# Patient Record
Sex: Male | Born: 1950 | Race: White | Hispanic: No | Marital: Married | Smoking: Former smoker
Health system: Southern US, Community
[De-identification: ages and names within clinical notes are randomized; demographics above are authoritative.]

## PROBLEM LIST (undated history)

## (undated) DIAGNOSIS — C3490 Malignant neoplasm of unspecified part of unspecified bronchus or lung: Secondary | ICD-10-CM

## (undated) DIAGNOSIS — I10 Essential (primary) hypertension: Secondary | ICD-10-CM

## (undated) DIAGNOSIS — E785 Hyperlipidemia, unspecified: Secondary | ICD-10-CM

## (undated) DIAGNOSIS — F419 Anxiety disorder, unspecified: Secondary | ICD-10-CM

## (undated) DIAGNOSIS — R000 Tachycardia, unspecified: Secondary | ICD-10-CM

## (undated) DIAGNOSIS — I219 Acute myocardial infarction, unspecified: Secondary | ICD-10-CM

## (undated) DIAGNOSIS — I480 Paroxysmal atrial fibrillation: Secondary | ICD-10-CM

## (undated) DIAGNOSIS — M1990 Unspecified osteoarthritis, unspecified site: Secondary | ICD-10-CM

## (undated) DIAGNOSIS — E0590 Thyrotoxicosis, unspecified without thyrotoxic crisis or storm: Secondary | ICD-10-CM

## (undated) DIAGNOSIS — I499 Cardiac arrhythmia, unspecified: Secondary | ICD-10-CM

## (undated) HISTORY — DX: Acute myocardial infarction, unspecified: I21.9

## (undated) HISTORY — PX: CARDIAC CATHETERIZATION: SHX172

## (undated) HISTORY — DX: Unspecified osteoarthritis, unspecified site: M19.90

## (undated) HISTORY — DX: Malignant neoplasm of unspecified part of unspecified bronchus or lung: C34.90

---

## 2006-06-19 ENCOUNTER — Inpatient Hospital Stay (HOSPITAL_COMMUNITY): Admission: EM | Admit: 2006-06-19 | Discharge: 2006-06-23 | Payer: Self-pay | Admitting: Emergency Medicine

## 2006-06-19 ENCOUNTER — Ambulatory Visit: Payer: Self-pay | Admitting: Cardiology

## 2006-06-21 ENCOUNTER — Ambulatory Visit: Payer: Self-pay | Admitting: Internal Medicine

## 2006-07-06 ENCOUNTER — Ambulatory Visit: Payer: Self-pay | Admitting: Cardiology

## 2007-12-12 ENCOUNTER — Emergency Department (HOSPITAL_COMMUNITY): Admission: EM | Admit: 2007-12-12 | Discharge: 2007-12-12 | Payer: Self-pay | Admitting: Emergency Medicine

## 2007-12-23 ENCOUNTER — Emergency Department (HOSPITAL_COMMUNITY): Admission: EM | Admit: 2007-12-23 | Discharge: 2007-12-23 | Payer: Self-pay | Admitting: Emergency Medicine

## 2010-03-10 ENCOUNTER — Encounter: Payer: Self-pay | Admitting: Cardiology

## 2010-07-02 NOTE — Discharge Summary (Signed)
[name], [name] NO.:  [id number]   MEDICAL RECORD NO.:  [id number]          PATIENT TYPE:  INP   LOCATION:  3707                         FACILITY:  [facility]   PHYSICIAN:  Doylene Canning. Ladona Ridgel, MD    DATE OF BIRTH:  [birth year]   DATE OF ADMISSION:  06/21/2006  DATE OF DISCHARGE:  06/23/2006                               DISCHARGE SUMMARY   CARDIOLOGIST:  Dr. Dietrich Pates.   PRIMARY CARE:  [facility].   DISCHARGING PHYSICIAN:  Dr. Charlies Constable.   DISCHARGE DIAGNOSES:  1. Non-ST elevated myocardial infarction (MI) status post cardiac      catheterization this admission showing nonobstructive coronary      artery disease.  2. Paroxysmal atrial fibrillation, currently in normal sinus rhythm.  3. Hyperlipidemia.  4. Ongoing tobacco use.  5. Hypertension.   PROCEDURE:  This admission include cardiac catheterization on 06/22/2006  by Dr. Charlies Constable.   HOSPITAL COURSE:  Alan [name] is a 60 year old Caucasian gentleman who  initially presented to [facility] on May 2 complaining of chest  discomfort and palpitations.  Apparently, prior to being admitted, he  had, on the evening before after getting off work, he had eaten 3 hot  dogs, 2 hamburgers, [place] fries and 3 milk shakes.  He sat down to  watch TV, developed some chest discomfort, apparently vomited times 5,  became short of breath, noticed his heart rate had increased.  The  patient had a presyncopal episode.  Daughter drove the patient to [facility] around 3:00 a.m.   The patient was noted to be in atrial fibrillation with rapid  ventricular response.  He received IV Cardizem and spontaneously  converted to normal sinus rhythm.  He was admitted.  Cardiac enzymes  with a bump in his troponin.  Echocardiogram was obtained that showed an  EF that was normal with moderate LVH.  CT of the chest showed coronary  artery calcification.  It was felt the patient needed further  diagnostic  evaluation.   He was transferred to [facility] for cardiac catheterization.  The  patient to the cath lab on Jun 22, 2006, found to have nonobstructive  CAD, EF of 60%.  The patient tolerated catheterization without  complications.  Continued to remain in normal sinus rhythm.  Dr. Juanda Chance  in to see the patient on day of discharge.  The patient without further  episodes of chest discomfort, palpitations.  Patient being discharged  home after smoking cessation education and cardiac rehabilitation  education provided.   At time of discharge, hemoglobin 14.3, BUN and creatinine 13, 1.3.  Total cholesterol 249, HDL 29, LDL 145, triglycerides 374.  The patient  with a [place] score of 1.   He is being discharged home on aspirin 325 mg daily, Toprol XL 50 mg  daily, simvastatin 40 mg daily.  The patient's states that he  financially cannot afford to take many medications and definitely not  name brands.   I have scheduled him a followup appointment with Dr. Dietrich Pates on May 19  at 1:45.  He will also need blood work in 6 weeks, lipids and LFTs.  This needs to be arranged at followup appointment with Dr. Dietrich Pates.  He  has been given the post cardiac catheterization discharge instructions  along with smoking cessation education and post myocardial infarction  discharge instructions including medication review and diet and exercise  education.   Duration of discharge encounter is 40 minutes.      Dorian Pod, ACNP      Doylene Canning. Ladona Ridgel, MD  Electronically Signed    MB/MEDQ  D:  06/23/2006  T:  06/23/2006  Job:  [id number]   cc:   [facility]

## 2010-07-02 NOTE — Cardiovascular Report (Signed)
NAMEOSMAN, CALZADILLA NO.:  [id number]   MEDICAL RECORD NO.:  [id number]           PATIENT TYPE:   LOCATION:                                 FACILITY:   PHYSICIAN:  Bruce R. Juanda Chance, MD, [facility]   DATE OF BIRTH:   DATE OF PROCEDURE:  06/22/2006  DATE OF DISCHARGE:                            CARDIAC CATHETERIZATION   CLINICAL HISTORY:  Mr. Mottola is 60 years old and was admitted the  hospital with chest pain consistent with an acute coronary syndrome.  He  was scheduled for evaluation angiography.  He also has had paroxysmal  atrial fibrillation.   PROCEDURE:  Left heart catheterization was performed percutaneously via  femoral arterial sheath and __________ coronary catheters.  A front wall  arterial puncture was used and Omnipaque contrast was used.  The patient  tolerated the procedure well and left the laboratory in satisfactory  condition.  The right femoral artery was closed Angio-Seal at end of  procedure.   RESULTS:  Left main coronary artery:  The left main coronary artery was  free of disease.    Left anterior descending artery:  The left descending artery gave rise  to the diagonal branch and two septal perforators.  There was 50%  narrowing after the first diagonal branch.   Circumflex artery:  The circumflex artery gave rise to a small marginal  branch, a large marginal branch and two posterolateral branches.  There  was 30% mid and 40%  distal stenosis in circumflex artery.   Right coronary artery:  The right coronary artery was a moderate-sized  vessel gave rise to the conus branch, right ventricle branch, posterior  descending branch and two posterolateral branches.  There was 30%  narrowing in the proximal right coronary.   The left ventriculogram performed in the RAO projection showed good wall  motion with no areas of hypokinesis.  Estimated ejection fraction was  60%.   CONCLUSION:  Nonobstructive coronary artery disease with 50%  narrowing  in the proximal LAD, 30% mid and 40% distal stenosis in the circumflex  artery, and 30% stenosis in the mid right coronary artery with normal LV  function.   RECOMMENDATIONS:  Reassurance.   INDICATIONS:      Everardo Beals. Juanda Chance, MD, [facility]  Electronically Signed     BRB/MEDQ  D:  10/29/2006  T:  10/29/2006  Job:  [id number]   cc:   Gerrit Friends. Dietrich Pates, MD, [facility]

## 2010-07-02 NOTE — Letter (Signed)
Jul 06, 2006     RE:  SHREY, BOIKE  MRN:  [id number]  /  DOB:  [birth year]   Teena Irani. Arlyce Dice, M.D.  [address]  [place], [place] [address]   Dear Onalee Hua:   Mr. Dobler returns to the office following a recent admission to [facility] after he presented to the emergency department of [facility] with atrial fibrillation, a rapid ventricular response,  and chest pain. CT scanning of the chest revealed the presence of  moderate coronary artery calcifications, but catheterization indicated  no significant obstructive disease. He has done very well on Toprol 50  mg daily and aspirin. He was also treated with Simvastatin for moderate  hyperlipidemia.   PHYSICAL EXAMINATION:  GENERAL:  On examination, a very pleasant  gentleman in no acute distress.  VITAL SIGNS:  The weight is 190 pounds. Blood pressure 130/60. Heart  rate 65 and regular. Respiratory rate 16.  HEENT:  Poor dentition.  NECK:  No jugular venous distention; normal carotid upstrokes without  bruits.  LUNGS:  Clear.  CARDIAC:  Normal first and second heart sounds; fourth heart sound  present.  ABDOMEN:  Soft and nontender; no organomegaly.  EXTREMITIES:  No edema; normal distal pulses.   DIAGNOSTIC STUDIES:  Rhythm strip:  Sinus bradycardia with sinus  arrhythmia.   IMPRESSION:  Mr. Paolo is doing very well with his current medication.  He is receiving maximal beta blocker with a heart rate in the 50's. We  will change this to generic and Simvastatin to Pravastatin 80 mg daily.  A lipid profile will be obtained in 1 month. I will plan to see this  nice gentleman again in 1 year. He is advised to discontinue use of  tobacco products.    Sincerely,      Gerrit Friends. Dietrich Pates, MD, [facility]  Electronically Signed    RMR/MedQ  DD: 07/06/2006  DT: 07/06/2006  Job #: [id number]

## 2010-07-05 NOTE — Procedures (Signed)
NAMESEBASTIAN, LURZ NO.:  [id number]   MEDICAL RECORD NO.:  [id number]          PATIENT TYPE:  INP   LOCATION:  A207                          FACILITY:  [facility]   PHYSICIAN:  Gerrit Friends. Dietrich Pates, MD, FACCDATE OF BIRTH:  04/06/1950   DATE OF PROCEDURE:  DATE OF DISCHARGE:                                ECHOCARDIOGRAM   REFERRING PHYSICIAN:  Dr. Margo Aye and Dr. Dietrich Pates.   CLINICAL DATA:  A 60 year old gentleman with atrial fibrillation and  cardiomegaly.  Aorta 3.5, left atrium 4.1, septum 1.5, posterior wall  1.6, LV diastole 3.8, LV systole 2.6.  1. Technically adequate echocardiographic study.  2. Very mild left atrial enlargement; normal right atrium and right      ventricle.  3. Normal aortic, mitral, tricuspid and pulmonic valves.  Normal      proximal pulmonary artery.  4. Normal IVC.  5. Normal left ventricular size; moderate hypertrophy; normal regional      and global function.      Gerrit Friends. Dietrich Pates, MD, [facility]  Electronically Signed     RMR/MEDQ  D:  06/19/2006  T:  06/19/2006  Job:  [id number]

## 2010-07-05 NOTE — H&P (Signed)
NAME:  Alan [name], Alan [name] NO.:  [id number]   MEDICAL RECORD NO.:  [id number]          PATIENT TYPE:  INP   LOCATION:  A207                          FACILITY:  [facility]   PHYSICIAN:  Catalina Pizza, M.D.        DATE OF BIRTH:  08-27-1950   DATE OF ADMISSION:  06/19/2006  DATE OF DISCHARGE:  LH                              HISTORY & PHYSICAL   PRIMARY MD:  At [facility], but it has been sometime  since he has seen them.   CHIEF COMPLAINT:  Chest pain, nausea and vomiting.   HISTORY OF PRESENT ILLNESS:  Alan [name] is a 60 year old that obese white  male who does not have any significant past medical history.  He was in  his usual state of health until this evening when he came home from work  and had three hot dogs, two hamburgers, some [place] fries, and three  milk shakes.  At that time the patient noted some substernal chest pain;  and had five episodes of nausea and vomiting noting that his heart was  racing more.  He came into the emergency department for further  assessment.  At that time he was found to have a heart rate at 166 and  EKG obtained showed atrial fibrillation with RVR.  The patient was given  two bolus doses of Cardizem; and his heart rate decreased significantly,  but still remained in atrial fibrillation.   PAST MEDICAL HISTORY:  None.   PAST SURGICAL HISTORY:  None.   MEDICATIONS:  None.   ALLERGIES:  No known drug allergies.   SOCIAL HISTORY:  Lives in [place] with his wife, son, daughter, and  granddaughter.  He works at [employer] in [place].  He smokes  approximately one pack every 2-3 days of cigarettes; and also smokes  several cigars each day as well.  Occasional alcohol.  No other drug  use.   FAMILY HISTORY:  Father is alive at age 33, apparently he has had a  replacement valve before; and also has high blood pressure.  Mother is  age 17 with no history of back pain.  No other siblings.   REVIEW OF SYSTEMS:  The  patient denies any problems with fever or  chills.  No weight loss, weight gain no problems with vision.  Denies  any specific shortness of breath with this chest pain.  Denies any  radiation of the chest pain as well, denies any abdominal pain.  Did  have these episodes of nausea and vomiting; does not have them at this  time.  Denies any swelling in his legs.  No neurologic deficits either.   PHYSICAL EXAM:  VITAL SIGNS: Temperature is 98.1, blood pressure 135/61,  heart rate is ranging between 80 and 105 at this time; respiratory rate  is 16; saturating 100% on room air.  GENERAL:  This is an obese white male lying on the stretcher in no acute  distress.  HEENT:  Unremarkable.  Pupils equal, round, react to light and  accommodation.  Does have missing front teeth.  Oropharynx --  mucous  membranes are moist.  NECK:  Showed no specific JVD.  No thyromegaly.  LUNGS:  Clear to auscultation bilaterally.  HEART:  Irregularly irregular, but slower at this time.  I do not  appreciate any S4 or murmur at this time.  ABDOMEN:  Protuberant, soft, positive bowel sounds  EXTREMITIES:  No lower extremity edema.  2+ pulses in all extremities.  NEUROLOGIC:  The patient is moving all extremities.  Cranial nerves II-  XII intact.  No deficits appreciated.   LAB WORK OBTAINED:  Showed a white count of 12.9, hemoglobin of 15.1,  platelet count of 171, ANC of 10.6.  Sodium 137, potassium 4.0, chloride  104 dioxide 25, glucose 173, BUN 20, creatinine 1.03, calcium 9.6.  Myoglobin of 73.3.  CK/MB of 3.9, troponin I of 0.06.   CHEST X-RAY:  Shows cardiomegaly with no acute disease apparent.   IMPRESSION:  This is a 60 year old gentleman with new onset of atrial  fibrillation with RVR with minimal past medical history up until this  point.   ASSESSMENT/PLAN:  1. Atrial fibrillation with RVR.  Given chest x-ray showing      cardiomegaly and slight increase in troponin I of 0.06; he needs a      full  cardiac workup including cardiac panel q.8 h. x3, a fasting      lipid panel, TSH and 2-D echo.  He needs to get a cardiology      consult this morning.  Since apparently this episode occurred at      approximately 1 o'clock this morning, we will start on full dose      anticoagulation with Lovenox 1 mg/kg subcu q.12 h.,  first dose      now.  Will continue on the Cardizem drip, for now, and start on low      dose metoprolol 25 mg p.o. b.i.d. as well as full dose aspirin.  He      needs full risk stratification.  The patient is set up for multiple      issues both with the smoking as well as obesity and cardiomegaly.  2. Tobacco abuse.  Discussed with the patient that given his heart      history, now; we will definitely need to work on quitting. If his      troponins continue to trend up, there is a possibility that the      patient needs to the have a catheterization done, but will defer to      cardiology as well as if cardioversion would be an option for this      patient.      Catalina Pizza, M.D.  Electronically Signed     ZH/MEDQ  D:  06/19/2006  T:  06/19/2006  Job:  [id number]

## 2010-07-05 NOTE — Consult Note (Signed)
NAMESHAHEIM, MAHAR NO.:  [id number]   MEDICAL RECORD NO.:  [id number]          PATIENT TYPE:  INP   LOCATION:  A207                          FACILITY:  [facility]   PHYSICIAN:  Gerrit Friends. Dietrich Pates, MD, FACCDATE OF BIRTH:  [birth year]   DATE OF CONSULTATION:  06/19/2006  DATE OF DISCHARGE:  06/21/2006                                 CONSULTATION   REFERRING PHYSICIAN:  Dr. Benson Setting   PRIMARY CARE PHYSICIAN:  [facility]   HISTORY OF PRESENT ILLNESS:  A 60 year old gentleman presenting to  hospital after an episode of nausea and emesis followed by palpitations  and chest discomfort.  Mr. Mousel was found to have atrial fibrillation  with rapid ventricular response in the emergency department.  After  initial therapy to slow the heart rate, he converted back to sinus  rhythm with resolution of all symptoms.  EKG both during tachycardia and  subsequently has not had any marked abnormalities; however, troponin has  been mildly elevated.  There is some dispute about exactly how much  chest discomfort and when Mr. Mcadory has experienced.  His wife reports  exertional discomfort prior to this episode, but the patient minimizes  those issues.  He did have some exercise intolerance, but he claims this  was due to fatigue rather than chest discomfort or dyspnea.   PAST MEDICAL HISTORY:  Essentially unremarkable.  The patient has never  required surgery.  He has not had any chronic medical issues including  hypertension.   MEDICATIONS:  He has not been taking any medications routinely.   ALLERGIES:  He has no known allergies.   SOCIAL HISTORY:  Lives in [place] with wife, son, granddaughter and  daughter; works at [employer] in a position that requires moderate  physical activity.  Smokes cigarettes at the rate of approximately 1/2  pack per day plus some cigars.  No excessive use of alcohol.  No drugs  of abuse.   FAMILY HISTORY:  Father it is alive and  fairly well at age 61 with a  history of valve replacement surgery and hypertension.  Mother is also  living at age 61 with no cardiovascular issues.  The patient has no  other 1st-degree relatives.   REVIEW OF SYSTEMS:  Generally negative.  The patient specifically denies  any chronic cardiac symptoms or GI symptoms.  All other systems reviewed  and are negative.   PHYSICAL EXAMINATION:  GENERAL:  On exam, pleasant gentleman in no acute  distress.  VITAL SIGNS:  The temperature is 97.5, blood pressure 130/70, heart rate  60 and regular, respirations 12, O2 saturation 95% on room air.  Weight  86 kg.  HEENT:  Anicteric sclerae; normal lids and conjunctivae; poor dentition.  NECK:  No jugular venous distension; no carotid bruits.  ENDOCRINE:  No thyromegaly.  HEMATOPOIETIC:  No adenopathy.  LUNGS:  Coarse breath sounds at the bases without any pathologic  findings.  CARDIAC:  Normal 1st and 2nd heart sounds; 4th heart sound present;  modest basilar systolic ejection murmur.  ABDOMEN:  Soft and nontender; no bruits; no  organomegaly.  EXTREMITIES:  No edema; distal pulses intact.  NEUROMUSCULAR:  Symmetric strength and tone; normal cranial nerves.   EKG:  Initially atrial fibrillation with a rapid ventricular response;  right ventricular conduction delay; nondiagnostic and fairly modest ST-  segment depression.  Following conversion to sinus bradycardia there was  continued right ventricular conduction delay and a rightward axis.   CHEST X-RAY:  Was unremarkable.   Other laboratories notable for a peak troponin of 0.4 with normal CPK  and CPK-MB.  White count was initially modestly elevated at 12,900 with  a left shift.  Glucose was 173.  Lipid profile is suboptimal with total  cholesterol 249, triglycerides of 374, HDL of 29 and LDL of 145.  TSH  was low at 0.18.   IMPRESSION:  1. Previously healthy 60 year old gentleman with cardiovascular risk      factors that include  possible newly-diagnosed but mild diabetes and      hyperlipidemia.  Evaluating this gentleman on Friday evening is      somewhat inconvenient.  Neither stress test nor coronary      angiography will be available until Monday.  His symptoms,      particularly this question of exertional chest discomfort, warrant      some evaluation for ischemic heart disease.  We will proceed with a      CT scan of the heart.  If there are no arterial calcifications, the      risk of coronary disease is low.  If there are significant      calcifications, cardiac catheterization will be warranted.      Attention will be paid to hyperlipidemia as appropriate.  We will      further monitor for diabetes.  Blood pressure has been acceptable.  2. A brief episode of atrial fibrillation without risk factors for      thromboembolism does not mandate chronic anticoagulation.  We will      hold off on warfarin therapy for the time being.  3. TSH is abnormal, but not markedly so.  T4 and T3 levels will be      obtained.  Consultation with an endocrinologist may be necessary.      Gerrit Friends. Dietrich Pates, MD, [facility]  Electronically Signed     RMR/MEDQ  D:  06/22/2006  T:  06/22/2006  Job:  [id number]

## 2010-07-05 NOTE — H&P (Signed)
NAMEAIDENJAMES, HECKMANN NO.:  [id number]   MEDICAL RECORD NO.:  [id number]          PATIENT TYPE:  INP   LOCATION:  3707                         FACILITY:  [facility]   PHYSICIAN:  Doylene Canning. Ladona Ridgel, MD    DATE OF BIRTH:  [birth year]   DATE OF ADMISSION:  06/21/2006  DATE OF DISCHARGE:                              HISTORY & PHYSICAL   PRIMARY CARE PHYSICIAN:  Production assistant, radio.   CARDIOLOGIST:  In [place], Dr. Dietrich Pates.   HISTORY:  Alan [name] is a 60 year old white male who was initially  admitted to [facility] on Jun 19, 2006, when he presented to the  emergency room with chest discomfort and palpitations.   On Thursday at approximately 10:00 p.m. after getting off work, he ate  three hot dogs, two hamburgers, [place] fries and three milk shakes.  He  then sat down to watch TV, and around 12:30 he developed anterior chest  discomfort which he described as the food being hung in his chest.  He  drank some baking soda with water, without difficulty.  He went to the  bathroom, developed nausea and threw up 5 times, without relief of his  chest discomfort.  He denied shortness of breath and diaphoresis.  However, his wife stated hat he was slightly short of breath.  After  throwing up, he noted an increased heart rate.  He stated that he felt  fine during this.  However, his wife stated that he was very  uncomfortable and could not sit still and kept saying he felt like he  was going to pass out.  His daughter drove him to [facility] emergency  room around 3:00 a.m.   At [facility] emergency room, he was noted to be in atrial fibrillation  with a rapid ventricular rate.  He received IV Cardizem with rate  control and spontaneous conversion to normal sinus rhythm.  He was  admitted to [facility], where CK-MBs and relative indexes were  negative for myocardial infarctions; however, troponins were slightly  elevated.  An echocardiogram was  obtained and showed ejection fraction  that was normal with moderate LVH.  A chest CT was performed and showed  coronary artery calcification, and after reviewing with Dr. Dietrich Pates, it  was felt that he should come to [facility] for cardiac catheterization.   The patient denies prior symptoms.   PAST MEDICAL HISTORY:  NO KNOWN DRUG ALLERGIES.   MEDICATIONS:  Prior to admission do not include any prescription  medications.  He denies any surgeries.  He denies any history of  diabetes, myocardial infarction, CVA, hypertension, COPD, bleeding  dyscrasia, thyroid dysfunction.  He does have a history of  hyperlipidemia that is untreated.   SOCIAL HISTORY:  He resides in [place] with his wife, daughter and  granddaughter.  He has a total of two daughters, one son, three  grandchildren.  He is employed at the [employer] on [address] in  [place].  He does not exercise on a regular basis, but he is very  active in the yard. He denies any specific  diet or herbal medications.  He smokes approximately one pack every 2 days and cigars.  He may have a  couple beers a week, denies any drugs.   FAMILY HISTORY:  Mother and father are both living in their 9s.  His  father has a history of valve replacement and a history of hypertension.  He does not have any brothers or sisters.   REVIEW OF SYSTEMS:  In addition to the above, is notable for very poor  dentition.  He is missing several teeth.  He has not seen a dentist  since childhood.  All other point systems are unremarkable.  He denies  any exertional limitations such as dyspnea on exertion and chest  discomfort.   PHYSICAL EXAM:  GENERAL:  Well-nourished, well-developed, pleasant  slightly obese white male in no apparent distress.  VITAL SIGNS:  Temperature is 95, blood pressure is 127/75, T-max of  99.3, pulse is 59 and regular, respirations 16, 98% SAT on room air.  Admission weight to [facility] was 86.8 kg.  HEENT:  Is unremarkable, except  for a very very poor dentition with  multiple missing teeth.  NECK:  Supple without thyromegaly, adenopathy, JVD or carotid bruits.  CHEST:  Symmetrical excursion.  Decreased breath sounds but without  rales, rhonchi or wheezing.  HEART:  PMI is not displaced.  Regular rate and rhythm.  He has a soft  1/6 systolic murmur, best appreciated in the left sternal border.  All  pulses are symmetrical and intact without abdominal or femoral bruits.  SKIN:  Integument is intact.  ABDOMEN:  Slightly obese.  Bowel sounds present without organomegaly,  masses or tenderness.  EXTREMITIES:  Negative cyanosis, clubbing or edema.  MUSCULOSKELETAL:  Grossly unremarkable.  NEURO:  Grossly unremarkable.   Chest x-ray at [facility] showed cardiomegaly, no active disease.  Chest  CT showed coronary artery calcifications.  EKG at [facility] showed  atrial fibrillation with a ventricular rate of 166, early R-wave,  nonspecific ST-T wave changes.  Subsequent telemetry showed normal sinus  rhythm.  H&H today was 13.8, 39.5, normal indices, platelets 136, WBCs  8.8, sodium 137, potassium 42, BUN 13, creatinine 1.04, glucose 104, CK-  MBs had been within normal limits x5.  Troponins at [facility] were as  follows:  0.09, 0.4, 0.31, 0.25, 0.15.  TSH was slightly low at 1.84.  However, a free T4 was 1.17, and a free T3 was 3.2.  Lipids showed a  total cholesterol 249, triglycerides 374, HDL 29 and LDL 145.   IMPRESSION:  Prolonged chest discomfort with abnormal troponins and CT  at [facility], atrial fibrillation with a rapid ventricular  rate maintaining normal sinus rhythm, hypertension, hyperlipidemia with  initiation of Statin therapy, tobacco use, obesity, poor dentition.   DISPOSITION:  We are accepting [name] in transfer from [facility].  We will admit and continue transfer medications.  With his  abnormal troponin presentation, multiple risk factors and abnormal CT, we have  arranged a cardiac catheterization in the morning for further  evaluation.  While here, he will receive education in  regards to tobacco cessation, atrial fibrillation, hypertension,  hyperlipidemia, exercise and nutrition.  The benefits, procedure and  risks have been reviewed with Mr. Kondracki and his wife, in regards to a  cardiac catheterization.  They agree to proceed as planned.      Joellyn Rued, PA-C      Doylene Canning. Ladona Ridgel, MD  Electronically Signed  EW/MEDQ  D:  06/21/2006  T:  06/21/2006  Job:  [id number]   cc:   [facility]  Gerrit Friends. Dietrich Pates, MD, [facility]

## 2010-11-19 LAB — CBC
MCV: 86.9
Platelets: 164
RBC: 3.94 — ABNORMAL LOW

## 2010-12-24 ENCOUNTER — Other Ambulatory Visit: Payer: Self-pay

## 2010-12-24 ENCOUNTER — Observation Stay (HOSPITAL_COMMUNITY)
Admission: EM | Admit: 2010-12-24 | Discharge: 2010-12-25 | Disposition: A | Discharge status: Home / Self Care | Payer: BC Managed Care – PPO | Attending: Internal Medicine | Admitting: Internal Medicine

## 2010-12-24 ENCOUNTER — Encounter: Payer: Self-pay | Admitting: *Deleted

## 2010-12-24 DIAGNOSIS — F17200 Nicotine dependence, unspecified, uncomplicated: Secondary | ICD-10-CM | POA: Insufficient documentation

## 2010-12-24 DIAGNOSIS — Z9119 Patient's noncompliance with other medical treatment and regimen: Secondary | ICD-10-CM | POA: Insufficient documentation

## 2010-12-24 DIAGNOSIS — E785 Hyperlipidemia, unspecified: Secondary | ICD-10-CM | POA: Insufficient documentation

## 2010-12-24 DIAGNOSIS — I480 Paroxysmal atrial fibrillation: Secondary | ICD-10-CM | POA: Diagnosis present

## 2010-12-24 DIAGNOSIS — R739 Hyperglycemia, unspecified: Secondary | ICD-10-CM | POA: Diagnosis present

## 2010-12-24 DIAGNOSIS — Z91199 Patient's noncompliance with other medical treatment and regimen due to unspecified reason: Secondary | ICD-10-CM | POA: Insufficient documentation

## 2010-12-24 DIAGNOSIS — Z720 Tobacco use: Secondary | ICD-10-CM | POA: Diagnosis present

## 2010-12-24 DIAGNOSIS — R7309 Other abnormal glucose: Secondary | ICD-10-CM | POA: Insufficient documentation

## 2010-12-24 DIAGNOSIS — I4891 Unspecified atrial fibrillation: Principal | ICD-10-CM | POA: Insufficient documentation

## 2010-12-24 DIAGNOSIS — Z9114 Patient's other noncompliance with medication regimen: Secondary | ICD-10-CM

## 2010-12-24 HISTORY — DX: Hyperlipidemia, unspecified: E78.5

## 2010-12-24 HISTORY — DX: Tachycardia, unspecified: R00.0

## 2010-12-24 HISTORY — DX: Paroxysmal atrial fibrillation: I48.0

## 2010-12-24 LAB — BASIC METABOLIC PANEL
BUN: 17 mg/dL (ref 6–23)
Calcium: 9.9 mg/dL (ref 8.4–10.5)
Chloride: 102 mEq/L (ref 96–112)
GFR calc Af Amer: >90 mL/min (ref >90)
GFR calc non Af Amer: 88 mL/min — ABNORMAL LOW (ref >90)
Glucose, Bld: 164 mg/dL — ABNORMAL HIGH (ref 70–99)
Potassium: 4.2 mEq/L (ref 3.5–5.1)
Sodium: 138 mEq/L (ref 135–145)

## 2010-12-24 LAB — DIFFERENTIAL
Basophils Absolute: 0.1 10*3/uL (ref 0.0–0.1)
Eosinophils Absolute: 0.2 10*3/uL (ref 0.0–0.7)
Lymphocytes Relative: 21 % (ref 12–46)
Monocytes Absolute: 0.6 10*3/uL (ref 0.1–1.0)
Monocytes Relative: 7 % (ref 3–12)
Neutro Abs: 6.2 10*3/uL (ref 1.7–7.7)
Neutrophils Relative %: 70 % (ref 43–77)

## 2010-12-24 LAB — CBC
HCT: 45.6 % (ref 39.0–52.0)
MCH: 28.8 pg (ref 26.0–34.0)
MCHC: 33.8 g/dL (ref 30.0–36.0)
RDW: 12.6 % (ref 11.5–15.5)

## 2010-12-24 LAB — POCT I-STAT TROPONIN I

## 2010-12-24 MED ORDER — METOPROLOL SUCCINATE ER 50 MG PO TB24: 50.0000 mg | ORAL_TABLET | Freq: Every day | ORAL | Status: DC

## 2010-12-24 MED ORDER — ASPIRIN EC 325 MG PO TBEC: 325.0000 mg | DELAYED_RELEASE_TABLET | Freq: Every day | ORAL | Status: AC

## 2010-12-24 MED ORDER — DILTIAZEM HCL 50 MG/10ML IV SOLN
20.0000 mg | Freq: Once | INTRAVENOUS | Status: AC
  Administered 2010-12-24: 20 mg via INTRAVENOUS
  Filled 2010-12-24: qty 10

## 2010-12-24 MED ORDER — FLECAINIDE ACETATE 100 MG PO TABS
200.0000 mg | ORAL_TABLET | Freq: Once | ORAL | Status: AC
  Administered 2010-12-25: 200 mg via ORAL
  Filled 2010-12-24: qty 2

## 2010-12-24 MED ORDER — SODIUM CHLORIDE 0.9 % IV SOLN
Freq: Once | INTRAVENOUS | Status: AC
  Administered 2010-12-24: 23:00:00 via INTRAVENOUS

## 2010-12-24 MED ORDER — DEXTROSE 5 % IV SOLN
5.0000 mg/h | INTRAVENOUS | Status: DC
  Administered 2010-12-24: 5 mg/h via INTRAVENOUS
  Filled 2010-12-24: qty 100

## 2010-12-24 NOTE — ED Notes (Signed)
No chest pain , heart racing x 1 hour

## 2010-12-24 NOTE — ED Notes (Signed)
Pt in slow a-fib after 20mg  diltiazem bolus.

## 2010-12-24 NOTE — ED Provider Notes (Signed)
History  Scribed for Joya Gaskins, MD, the patient was seen in APA02/APA02. The chart was scribed by Gilman Schmidt. The patients care was started at 10:39 PM.  CSN: [id number] Arrival date & time: 12/24/2010 10:31 PM   First MD Initiated Contact with Patient 12/24/10 2230      Chief Complaint  Patient presents with  . Tachycardia    HPI Alan [name] is a 60 y.o. male who presents to the Emergency Department complaining of tachycardia onset 1 hour. States he "felt as if he would pass out". Denies any V/D, SOB, or cough. Pt has had similar sx in past. States he took himself of meds.  He denies CP Denies syncope Nothing makes the tachycardia worse Nothing improves the tachycardia  Past Medical History  Diagnosis Date  . Tachycardia     PMH - CAD, paroxysmal Afib  History reviewed. No pertinent family history.  History  Substance Use Topics  . Smoking status: Current Everyday Smoker    Types: Cigars  . Smokeless tobacco: Not on file  . Alcohol Use: Yes      Review of Systems  Respiratory: Negative for cough and shortness of breath.   Cardiovascular: Positive for palpitations. Negative for chest pain.  Gastrointestinal: Negative for vomiting and diarrhea.  All other systems reviewed and are negative.    Allergies  Review of patient's allergies indicates no known allergies.  Home Medications  NONE  BP 179/88  Pulse 158  Temp(Src) 98.3 F (36.8 C) (Oral)  Resp 19  Ht 5\' 5"  (1.651 m)  Wt 175 lb (79.379 kg)  BMI 29.12 kg/m2  SpO2 100%  Physical Exam CONSTITUTIONAL: Well developed/well nourished HEAD AND FACE: Normocephalic/atraumatic EYES: EOMI/PERRL ENMT: Mucous membranes moist NECK: supple no meningeal signs SPINE:entire spine nontender CV:  irregular heart beat, tachycardic LUNGS: Lungs are clear to auscultation bilaterally, no apparent distress ABDOMEN: soft, nontender, no rebound or guarding NEURO: Pt is awake/alert, moves all extremitiesx4, no  distress noted EXTREMITIES: pulses normal, full ROM, no edema SKIN: warm, color normal PSYCH: no abnormalities of mood noted   ED Course  Procedures  DIAGNOSTIC STUDIES: Oxygen Saturation is 100% on room air, normal by my interpretation.     Date: 12/24/2010  Rate: 158  Rhythm: atrial fibrillation with RVR  QRS Axis: normal  Intervals: normal  ST/T Wave abnormalities: nonspecific ST changes  Conduction Disutrbances:none  Narrative Interpretation:   Old EKG Reviewed: changes noted  Results for orders placed during the hospital encounter of 12/24/10  CBC      Component Value Range   WBC 9.0  4.0 - 10.5 (K/uL)   RBC 5.35  4.22 - 5.81 (MIL/uL)   Hemoglobin 15.4  13.0 - 17.0 (g/dL)   HCT 14.7  82.9 - 56.2 (%)   MCV 85.2  78.0 - 100.0 (fL)   MCH 28.8  26.0 - 34.0 (pg)   MCHC 33.8  30.0 - 36.0 (g/dL)   RDW 13.0  86.5 - 78.4 (%)   Platelets 150  150 - 400 (K/uL)  DIFFERENTIAL      Component Value Range   Neutrophils Relative 70  43 - 77 (%)   Neutro Abs 6.2  1.7 - 7.7 (K/uL)   Lymphocytes Relative 21  12 - 46 (%)   Lymphs Abs 1.9  0.7 - 4.0 (K/uL)   Monocytes Relative 7  3 - 12 (%)   Monocytes Absolute 0.6  0.1 - 1.0 (K/uL)   Eosinophils Relative 2  0 - 5 (%)  Eosinophils Absolute 0.2  0.0 - 0.7 (K/uL)   Basophils Relative 1  0 - 1 (%)   Basophils Absolute 0.1  0.0 - 0.1 (K/uL)     COORDINATION OF CARE: 10:39PM:  - Patient evaluated by ED physician, Cardizem, BMP, CBC, Diff, i-stat Troponin, EKG ordered  10:45 PM will start IV cardizem and reassess.  Will follow closely  11:10 PM Repeat EKG shows rate control afib (93) BP 179/88  Pulse 85  Temp(Src) 98.3 F (36.8 C) (Oral)  Resp 23  Ht 5\' 5"  (1.651 m)  Wt 175 lb (79.379 kg)  BMI 29.12 kg/m2  SpO2 95% Pt has no complaints at this time  11:47 PM D/w dr Gala Romney, on for [place] cardiology.  We discussed EKG, his previous h/o CAD, he recommend oral dose of flecainide, monitor for two hrs, and if spontaneously  converts he can be discharged homed with toprol and ASA and close cardiology followup  11:49 PM Discussed plan with dr Nicanor Alcon   MDM  Nursing notes reviewed and considered in documentation All labs/vitals reviewed and considered Previous records reviewed and considered   I personally performed the services described in this documentation, which was scribed in my presence. The recorded information has been reviewed and considered.          Joya Gaskins, MD 12/24/10 [phone]

## 2010-12-25 ENCOUNTER — Inpatient Hospital Stay (HOSPITAL_COMMUNITY): Payer: BC Managed Care – PPO

## 2010-12-25 ENCOUNTER — Encounter (HOSPITAL_COMMUNITY): Payer: Self-pay | Admitting: Internal Medicine

## 2010-12-25 DIAGNOSIS — I480 Paroxysmal atrial fibrillation: Secondary | ICD-10-CM | POA: Diagnosis present

## 2010-12-25 DIAGNOSIS — Z9114 Patient's other noncompliance with medication regimen: Secondary | ICD-10-CM

## 2010-12-25 DIAGNOSIS — Z720 Tobacco use: Secondary | ICD-10-CM | POA: Diagnosis present

## 2010-12-25 DIAGNOSIS — R739 Hyperglycemia, unspecified: Secondary | ICD-10-CM | POA: Diagnosis present

## 2010-12-25 DIAGNOSIS — I4891 Unspecified atrial fibrillation: Secondary | ICD-10-CM | POA: Diagnosis present

## 2010-12-25 DIAGNOSIS — E785 Hyperlipidemia, unspecified: Secondary | ICD-10-CM | POA: Diagnosis present

## 2010-12-25 LAB — MRSA PCR SCREENING: MRSA by PCR: NEGATIVE

## 2010-12-25 LAB — CARDIAC PANEL(CRET KIN+CKTOT+MB+TROPI)
Relative Index: INVALID (ref 0.0–2.5)
Total CK: 81 U/L (ref 7–232)

## 2010-12-25 LAB — CBC
HCT: 41.5 % (ref 39.0–52.0)
Hemoglobin: 14.1 g/dL (ref 13.0–17.0)
MCV: 84.9 fL (ref 78.0–100.0)
Platelets: 157 10*3/uL (ref 150–400)
RBC: 4.89 MIL/uL (ref 4.22–5.81)
WBC: 8.4 10*3/uL (ref 4.0–10.5)

## 2010-12-25 LAB — BASIC METABOLIC PANEL
CO2: 24 mEq/L (ref 19–32)
Chloride: 106 mEq/L (ref 96–112)
Creatinine, Ser: 0.82 mg/dL (ref 0.50–1.35)

## 2010-12-25 LAB — HEPATIC FUNCTION PANEL
ALT: 32 U/L (ref 0–53)
ALT: 26 U/L (ref 0–53)
Bilirubin, Direct: <0.1 mg/dL (ref 0.0–0.3)
Bilirubin, Direct: 0.1 mg/dL (ref 0.0–0.3)
Indirect Bilirubin: 0.1 mg/dL — ABNORMAL LOW (ref 0.3–0.9)

## 2010-12-25 LAB — APTT: aPTT: 28 seconds (ref 24–37)

## 2010-12-25 LAB — HEMOGLOBIN A1C: Mean Plasma Glucose: 123 mg/dL — ABNORMAL HIGH (ref <117)

## 2010-12-25 LAB — PROTIME-INR: Prothrombin Time: 13.2 seconds (ref 11.6–15.2)

## 2010-12-25 LAB — GLUCOSE, CAPILLARY

## 2010-12-25 MED ORDER — METOPROLOL TARTRATE 50 MG PO TABS
25.0000 mg | ORAL_TABLET | ORAL | Status: AC
  Administered 2010-12-25: 50 mg via ORAL
  Filled 2010-12-25: qty 1

## 2010-12-25 MED ORDER — SIMVASTATIN 20 MG PO TABS: 20.0000 mg | ORAL_TABLET | Freq: Every day | ORAL | Status: DC

## 2010-12-25 MED ORDER — INSULIN ASPART 100 UNIT/ML ~~LOC~~ SOLN: 0.0000 [IU] | Freq: Three times a day (TID) | SUBCUTANEOUS | Status: DC

## 2010-12-25 MED ORDER — METOPROLOL TARTRATE 25 MG PO TABS: 12.5000 mg | ORAL_TABLET | Freq: Two times a day (BID) | ORAL | Status: DC

## 2010-12-25 MED ORDER — ONDANSETRON HCL 4 MG PO TABS: 4.0000 mg | ORAL_TABLET | Freq: Four times a day (QID) | ORAL | Status: DC | PRN

## 2010-12-25 MED ORDER — ENOXAPARIN SODIUM 40 MG/0.4ML ~~LOC~~ SOLN
40.0000 mg | SUBCUTANEOUS | Status: DC
  Administered 2010-12-25: 40 mg via SUBCUTANEOUS
  Filled 2010-12-25: qty 0.4

## 2010-12-25 MED ORDER — DOCUSATE SODIUM 100 MG PO CAPS: 100.0000 mg | ORAL_CAPSULE | Freq: Two times a day (BID) | ORAL | Status: DC | PRN

## 2010-12-25 MED ORDER — ACETAMINOPHEN 650 MG RE SUPP: 650.0000 mg | Freq: Four times a day (QID) | RECTAL | Status: DC | PRN

## 2010-12-25 MED ORDER — ACETAMINOPHEN 325 MG PO TABS: 650.0000 mg | ORAL_TABLET | Freq: Four times a day (QID) | ORAL | Status: DC | PRN

## 2010-12-25 MED ORDER — METOPROLOL TARTRATE 25 MG PO TABS
25.0000 mg | ORAL_TABLET | Freq: Two times a day (BID) | ORAL | Status: DC
  Administered 2010-12-25: 25 mg via ORAL
  Filled 2010-12-25: qty 1

## 2010-12-25 MED ORDER — OXYCODONE HCL 5 MG PO TABS: 5.0000 mg | ORAL_TABLET | ORAL | Status: DC | PRN

## 2010-12-25 MED ORDER — POTASSIUM CHLORIDE IN NACL 20-0.9 MEQ/L-% IV SOLN
INTRAVENOUS | Status: DC
  Administered 2010-12-25: 1000 mL via INTRAVENOUS

## 2010-12-25 MED ORDER — INSULIN ASPART 100 UNIT/ML ~~LOC~~ SOLN: 0.0000 [IU] | Freq: Every day | SUBCUTANEOUS | Status: DC

## 2010-12-25 MED ORDER — ASPIRIN EC 81 MG PO TBEC
81.0000 mg | DELAYED_RELEASE_TABLET | Freq: Every day | ORAL | Status: DC
  Administered 2010-12-25: 81 mg via ORAL
  Filled 2010-12-25: qty 1

## 2010-12-25 MED ORDER — BISACODYL 10 MG RE SUPP: 10.0000 mg | RECTAL | Status: DC | PRN

## 2010-12-25 MED ORDER — DILTIAZEM HCL 100 MG IV SOLR
5.0000 mg/h | Freq: Once | INTRAVENOUS | Status: AC
  Administered 2010-12-25: 5 mg/h via INTRAVENOUS
  Filled 2010-12-25: qty 100

## 2010-12-25 MED ORDER — FLEET ENEMA 7-19 GM/118ML RE ENEM: 1.0000 | ENEMA | RECTAL | Status: DC | PRN

## 2010-12-25 MED ORDER — SODIUM CHLORIDE 0.9 % IV SOLN
Freq: Once | INTRAVENOUS | Status: AC
  Administered 2010-12-25: 03:00:00 via INTRAVENOUS

## 2010-12-25 MED ORDER — TRAZODONE HCL 50 MG PO TABS: 25.0000 mg | ORAL_TABLET | Freq: Every evening | ORAL | Status: DC | PRN

## 2010-12-25 MED ORDER — ONDANSETRON HCL 4 MG/2ML IJ SOLN: 4.0000 mg | Freq: Four times a day (QID) | INTRAMUSCULAR | Status: DC | PRN

## 2010-12-25 NOTE — Discharge Summary (Signed)
Physician Discharge Summary  Patient ID: Alan [name] MRN: [id number] DOB/AGE: May 14, 1950 60 y.o.  Admit date: 12/24/2010 Discharge date: 12/25/2010  Discharge Diagnoses:  Principal Problem:  *Atrial fibrillation with rapid ventricular response Active Problems:  Hyperglycemia  Noncompliance with medications  Hyperlipidemia  Tobacco abuse   Current Discharge Medication List    START taking these medications   Details  aspirin EC 325 MG tablet Take 1 tablet (325 mg total) by mouth daily. Qty: 30 tablet, Refills: 0    metoprolol tartrate (LOPRESSOR) 25 MG tablet Take 0.5 tablets (12.5 mg total) by mouth 2 (two) times daily. Qty: 30 tablet, Refills: 0        Discharge Orders    Future Orders Please Complete By Expires   Diet - low sodium heart healthy      Discharge instructions      Comments:   Quit smoking   Activity as tolerated - No restrictions         Follow-up Information    Follow up with HAWKINS,EDWARD L in 2 weeks. (to monitor heart rate)    Contact information:   [address] [place] [address] [phone]          Disposition:  home  Discharged Condition:  Stable  Consults:   none  Labs:   Results for orders placed during the hospital encounter of 12/24/10 (from the past 48 hour(s))  BASIC METABOLIC PANEL     Status: Abnormal   Collection Time   12/24/10 10:33 PM      Component Value Range Comment   Sodium 138  135 - 145 (mEq/L)    Potassium 4.2  3.5 - 5.1 (mEq/L)    Chloride 102  96 - 112 (mEq/L)    CO2 29  19 - 32 (mEq/L)    Glucose, Bld 164 (*) 70 - 99 (mg/dL)    BUN 17  6 - 23 (mg/dL)    Creatinine, Ser 2.95  0.50 - 1.35 (mg/dL)    Calcium 9.9  8.4 - 10.5 (mg/dL)    GFR calc non Af Amer 88 (*) >90 (mL/min)    GFR calc Af Amer >90  >90 (mL/min)   CBC     Status: Normal   Collection Time   12/24/10 10:33 PM      Component Value Range Comment   WBC 9.0  4.0 - 10.5 (K/uL)    RBC 5.35  4.22 - 5.81  (MIL/uL)    Hemoglobin 15.4  13.0 - 17.0 (g/dL)    HCT 28.4  13.2 - 44.0 (%)    MCV 85.2  78.0 - 100.0 (fL)    MCH 28.8  26.0 - 34.0 (pg)    MCHC 33.8  30.0 - 36.0 (g/dL)    RDW 10.2  72.5 - 36.6 (%)    Platelets 150  150 - 400 (K/uL)   DIFFERENTIAL     Status: Normal   Collection Time   12/24/10 10:33 PM      Component Value Range Comment   Neutrophils Relative 70  43 - 77 (%)    Neutro Abs 6.2  1.7 - 7.7 (K/uL)    Lymphocytes Relative 21  12 - 46 (%)    Lymphs Abs 1.9  0.7 - 4.0 (K/uL)    Monocytes Relative 7  3 - 12 (%)    Monocytes Absolute 0.6  0.1 - 1.0 (K/uL)    Eosinophils Relative 2  0 - 5 (%)  Eosinophils Absolute 0.2  0.0 - 0.7 (K/uL)    Basophils Relative 1  0 - 1 (%)    Basophils Absolute 0.1  0.0 - 0.1 (K/uL)   HEPATIC FUNCTION PANEL     Status: Abnormal   Collection Time   12/24/10 10:33 PM      Component Value Range Comment   Total Protein 8.3  6.0 - 8.3 (g/dL)    Albumin 4.0  3.5 - 5.2 (g/dL)    AST 23  0 - 37 (U/L)    ALT 32  0 - 53 (U/L)    Alkaline Phosphatase 121 (*) 39 - 117 (U/L)    Total Bilirubin 0.3  0.3 - 1.2 (mg/dL)    Bilirubin, Direct <1.6  0.0 - 0.3 (mg/dL)    Indirect Bilirubin NOT CALCULATED  0.3 - 0.9 (mg/dL)   MAGNESIUM     Status: Normal   Collection Time   12/24/10 10:33 PM      Component Value Range Comment   Magnesium 2.0  1.5 - 2.5 (mg/dL)   POCT I-STAT TROPONIN I     Status: Normal   Collection Time   12/24/10 10:52 PM      Component Value Range Comment   Troponin i, poc 0.00  0.00 - 0.08 (ng/mL)    Comment 3            MRSA PCR SCREENING     Status: Normal   Collection Time   12/25/10  4:24 AM      Component Value Range Comment   MRSA by PCR NEGATIVE  NEGATIVE    CBC     Status: Normal   Collection Time   12/25/10  4:39 AM      Component Value Range Comment   WBC 8.4  4.0 - 10.5 (K/uL)    RBC 4.89  4.22 - 5.81 (MIL/uL)    Hemoglobin 14.1  13.0 - 17.0 (g/dL)    HCT 10.9  60.4 - 54.0 (%)    MCV 84.9  78.0 - 100.0 (fL)     MCH 28.8  26.0 - 34.0 (pg)    MCHC 34.0  30.0 - 36.0 (g/dL)    RDW 98.1  19.1 - 47.8 (%)    Platelets 157  150 - 400 (K/uL)   HEPATIC FUNCTION PANEL     Status: Abnormal   Collection Time   12/25/10  4:39 AM      Component Value Range Comment   Total Protein 7.1  6.0 - 8.3 (g/dL)    Albumin 3.3 (*) 3.5 - 5.2 (g/dL)    AST 18  0 - 37 (U/L)    ALT 26  0 - 53 (U/L)    Alkaline Phosphatase 105  39 - 117 (U/L)    Total Bilirubin 0.2 (*) 0.3 - 1.2 (mg/dL)    Bilirubin, Direct 0.1  0.0 - 0.3 (mg/dL)    Indirect Bilirubin 0.1 (*) 0.3 - 0.9 (mg/dL)   MAGNESIUM     Status: Normal   Collection Time   12/25/10  4:39 AM      Component Value Range Comment   Magnesium 2.0  1.5 - 2.5 (mg/dL)   APTT     Status: Normal   Collection Time   12/25/10  4:39 AM      Component Value Range Comment   aPTT 28  24 - 37 (seconds)   PROTIME-INR     Status: Normal   Collection Time   12/25/10  4:39 AM  Component Value Range Comment   Prothrombin Time 13.2  11.6 - 15.2 (seconds)    INR 0.98  0.00 - 1.49    BASIC METABOLIC PANEL     Status: Abnormal   Collection Time   12/25/10  4:39 AM      Component Value Range Comment   Sodium 137  135 - 145 (mEq/L)    Potassium 3.9  3.5 - 5.1 (mEq/L)    Chloride 106  96 - 112 (mEq/L)    CO2 24  19 - 32 (mEq/L)    Glucose, Bld 122 (*) 70 - 99 (mg/dL)    BUN 20  6 - 23 (mg/dL)    Creatinine, Ser 1.61  0.50 - 1.35 (mg/dL)    Calcium 9.3  8.4 - 10.5 (mg/dL)    GFR calc non Af Amer >90  >90 (mL/min)    GFR calc Af Amer >90  >90 (mL/min)   CARDIAC PANEL(CRET KIN+CKTOT+MB+TROPI)     Status: Normal   Collection Time   12/25/10  4:39 AM      Component Value Range Comment   Total CK 81  7 - 232 (U/L)    CK, MB 3.9  0.3 - 4.0 (ng/mL)    Troponin I <0.30  <0.30 (ng/mL)    Relative Index RELATIVE INDEX IS INVALID  0.0 - 2.5    GLUCOSE, CAPILLARY     Status: Abnormal   Collection Time   12/25/10  8:08 AM      Component Value Range Comment   Glucose-Capillary 143 (*) 70 -  99 (mg/dL)    Comment 1 Notify RN      Comment 2 Documented in Chart       Diagnostics:  Dg Chest Portable 1 View  12/25/2010  *RADIOLOGY REPORT*  Clinical Data: A-fib with rapid ventricular response  PORTABLE CHEST - 1 VIEW  Comparison: 12/12/2007; 06/19/2006; chest CT - 06/20/2006  Findings: Unchanged borderline enlarged cardiac silhouette and mediastinal contours given slightly decreased lung volumes.  Mild pulmonary venous congestion without frank evidence of pulmonary edema.  Grossly unchanged bibasilar heterogeneous opacities.  No definite pleural effusion or pneumothorax.  Unchanged bones.  IMPRESSION: Mild pulmonary venous congestion without frank evidence of pulmonary edema.  Original Report Authenticated By: Waynard Reeds, M.D.    Procedures:  EKG: Atrial Fibrillation with RVR and nonspecific changes per ED note. EKG is not currently on the chart.  Full Code   Hospital Course: See H&P for complete admission details. The patient is a 60 year old white male with previous history of atrophic relation. He is noncompliant with followup and medications. He presented with sudden onset palpitations. He was found to be in nature fibrillation in the emergency room with a rate of about 150. The ED physician spoke with Dr. Teena Dunk who recommended an oral dose of flecainide, monitoring for 2 hours then discharge if he converted. The patient was placed on a Cardizem drip and monitored in the step down unit overnight. He converted to sinus rhythm. He admits that he may not be compliant with medications or followup, but I will set up an appointment with on-call medicine, Dr. Juanetta Gosling, and given the benefit of the doubt. He is medically stable and can be discharged home. He is also encouraged to quit smoking  Discharge Exam:  Blood pressure 130/62, pulse 55, temperature 97.7 F (36.5 C), temperature source Oral, resp. rate 16, height 5\' 5"  (1.651 m), weight 80.7 kg (177 lb 14.6 oz), SpO2  98.00%.  Telemetry  shows normal sinus rhythm with a rate of about 60. Gen.: Comfortable. Unkempt. HEENT: Missing multiple teeth. Lungs clear to auscultation bilaterally without wheeze rhonchi or rales Cardiovascular regular rate rhythm without murmurs gallops rubs Abdomen soft nontender nondistended Extremities no clubbing cyanosis or edema   Signed: Sonni Barse L 12/25/2010, 10:05 AM

## 2010-12-25 NOTE — Plan of Care (Signed)
Problem: Consults Goal: Nutrition Consult-if indicated Variance: Arrhythmia Comments: Patient admitted for tachycardia hx of atrial fib stopped taking medications unable to afford.  Will be monitoring cbg and teaching proper diet

## 2010-12-25 NOTE — Progress Notes (Signed)
Pt to be d/c home. All discharge instructions gone over with patient and patients spouse. All questions and concerns answered. Stressed importance of making and keeping follow up appointment with Dr. Juanetta Gosling in 2 weeks. Pt acknowledged importance of keeping appointment. Pt and family member waiting on ride.

## 2010-12-25 NOTE — Progress Notes (Signed)
Pt's ride is here to pick patient up. Walked patient out to main entrance of hospital.

## 2010-12-25 NOTE — Progress Notes (Signed)
UR Chart Review Completed  

## 2010-12-25 NOTE — Progress Notes (Signed)
[facility] INTENSIVE CARE UNIT [address] [place] [address]  December 25, 2010  Patient: Alan [name]  Date of Birth: Jun 11, 1950  Date of Visit: 12/24/2010    To Whom It May Concern:  Ezekeil Bethel was seen and treated in our emergency department on 12/24/2010. HUMBERT MOROZOV  may return to work on 12/26/2010  Sincerely,     Crista Curb, M.D.

## 2010-12-25 NOTE — H&P (Signed)
PCP:   No primary provider on file.   Chief Complaint:  Palpitations starting about 9 PM last night  HPI: Alan [name] is an 60 y.o. Caucasian male. Episode of paroxysmal atrial fibrillation in 2008. Managed by [facility], at that time, had a negative cardiac catheterization, but became quickly noncompliant with Toprol and simvastatin on which she was discharged. He has had no medical followup since that time. He reports he has had no medical problems since then.  He occasionally takes ibuprofen for occasional aches, denies alcohol or illicit drug use, and while sitting watching the election results yesterday evening, had sudden onset of chest palpitations which she recognized. There was no associated chest pain or pressure no dizziness or lightheadedness no nausea or vomiting. He presented to the emergency room for treatment, and received ASA 325 mg,  a bolus of Cardizem, 50 mg of Toprol, and then 200 mg of oral flecainide, and when he failed to convert to a sinus rhythm the hospitalist service was called.  He denies fever cough or cold chest pain shortness of breath nausea vomiting diarrhea dysuria black or bloody stool.  Past Medical History  Diagnosis Date  . Tachycardia   . Hyperlipemia   . Paroxysmal atrial fibrillation     History reviewed. No pertinent past surgical history.  Medications: Occassionally Ibuprofen.   Allergies:  No Known Allergies  Social History:   reports that he has been smoking Cigars.  He does not have any smokeless tobacco history on file. He reports that he drinks alcohol. He reports that he does not use illicit drugs. works as a Midwife in [place]  Family History: Family History  Problem Relation Age of Onset  . Hypertension Father   . Dementia Father     died age 5    Rewiew of Systems:  The patient denies anorexia, fever, weight loss,, vision loss, decreased hearing, hoarseness, chest pain, syncope, dyspnea  on exertion, peripheral edema, balance deficits, hemoptysis, abdominal pain, melena, hematochezia, severe indigestion/heartburn, hematuria, incontinence, genital sores, muscle weakness, suspicious skin lesions, transient blindness, difficulty walking, depression, unusual weight change, abnormal bleeding, enlarged lymph nodes, angioedema, and breast masses.   Physical Exam: Filed Vitals:   12/24/10 2332 12/25/10 0024 12/25/10 0144 12/25/10 0314  BP: 146/68 141/54 141/69 141/99  Pulse: 82 78 91 99  Temp:      TempSrc:      Resp: 22 16 18    Height:      Weight:      SpO2: 97% 97% 97%    Blood pressure 141/99, pulse 99, temperature 98.3 F (36.8 C), temperature source Oral, resp. rate 18, height 5\' 5"  (1.651 m), weight 79.379 kg (175 lb), SpO2 97.00%.  GEN: Pleasant middle-aged Caucasian gentleman lying in the stretcher in no acute distress; cooperative with exam PSYCH: He is alert and oriented x4; does not appear anxious does not appear depressed; affect is normal HEENT: Mucous membranes pink and anicteric; PERRLA; EOM intact; no cervical lymphadenopathy nor thyromegaly or carotid bruit; no JVD; poor dentition multiple missing teeth Breasts:: Not examined CHEST WALL: No tenderness CHEST: Normal respiration, clear to auscultation bilaterally HEART: Irregularly irregular rate and rhythm; no murmurs rubs or gallops BACK: No kyphosis or scoliosis; no CVA tenderness ABDOMEN: Obese, soft non-tender; no masses, no organomegaly, normal abdominal bowel sounds; no pannus; no intertriginous candida. Rectal Exam: Not done EXTREMITIES:  age-appropriate arthropathy of the hands and knees; no edema; no ulcerations. Genitalia: not examined PULSES: 2+ and  symmetric SKIN: Normal hydration no rash or ulceration CNS: Cranial nerves 2-12 grossly intact no focal neurologic deficit   Labs & Imaging Results for orders placed during the hospital encounter of 12/24/10 (from the past 48 hour(s))  BASIC  METABOLIC PANEL     Status: Abnormal   Collection Time   12/24/10 10:33 PM      Component Value Range Comment   Sodium 138  135 - 145 (mEq/L)    Potassium 4.2  3.5 - 5.1 (mEq/L)    Chloride 102  96 - 112 (mEq/L)    CO2 29  19 - 32 (mEq/L)    Glucose, Bld 164 (*) 70 - 99 (mg/dL)    BUN 17  6 - 23 (mg/dL)    Creatinine, Ser 7.82  0.50 - 1.35 (mg/dL)    Calcium 9.9  8.4 - 10.5 (mg/dL)    GFR calc non Af Amer 88 (*) >90 (mL/min)    GFR calc Af Amer >90  >90 (mL/min)   CBC     Status: Normal   Collection Time   12/24/10 10:33 PM      Component Value Range Comment   WBC 9.0  4.0 - 10.5 (K/uL)    RBC 5.35  4.22 - 5.81 (MIL/uL)    Hemoglobin 15.4  13.0 - 17.0 (g/dL)    HCT 95.6  21.3 - 08.6 (%)    MCV 85.2  78.0 - 100.0 (fL)    MCH 28.8  26.0 - 34.0 (pg)    MCHC 33.8  30.0 - 36.0 (g/dL)    RDW 57.8  46.9 - 62.9 (%)    Platelets 150  150 - 400 (K/uL)   DIFFERENTIAL     Status: Normal   Collection Time   12/24/10 10:33 PM      Component Value Range Comment   Neutrophils Relative 70  43 - 77 (%)    Neutro Abs 6.2  1.7 - 7.7 (K/uL)    Lymphocytes Relative 21  12 - 46 (%)    Lymphs Abs 1.9  0.7 - 4.0 (K/uL)    Monocytes Relative 7  3 - 12 (%)    Monocytes Absolute 0.6  0.1 - 1.0 (K/uL)    Eosinophils Relative 2  0 - 5 (%)    Eosinophils Absolute 0.2  0.0 - 0.7 (K/uL)    Basophils Relative 1  0 - 1 (%)    Basophils Absolute 0.1  0.0 - 0.1 (K/uL)   POCT I-STAT TROPONIN I     Status: Normal   Collection Time   12/24/10 10:52 PM      Component Value Range Comment   Troponin i, poc 0.00  0.00 - 0.08 (ng/mL)    Comment 3             No results found.    Assessment Present on Admission:  .Atrial fibrillation with rapid ventricular response .Hyperlipidemia .Hyperglycemia .Tobacco abuse   PLAN: He continues to be tachycardic and in atrial fibrillation. Will give an additional dose of 25 mg of Lopressor and start Lopressor twice a day and arrange for cardiology consult in the  morning.  We'll check his fasting lipid panel and restart Zocor  Will check his magnesium, thyroid functions, and hemoglobin A1c. Counseled on tobacco cessation, Counseled on the importance of adherence to medical therapy.   Other plans as per orders.   Tyshawn Keel 12/25/2010, 3:32 AM

## 2011-01-21 ENCOUNTER — Other Ambulatory Visit (HOSPITAL_COMMUNITY): Payer: Self-pay | Admitting: Endocrinology

## 2011-01-21 DIAGNOSIS — E0590 Thyrotoxicosis, unspecified without thyrotoxic crisis or storm: Secondary | ICD-10-CM

## 2011-01-30 ENCOUNTER — Encounter (HOSPITAL_COMMUNITY)
Admission: RE | Admit: 2011-01-30 | Discharge: 2011-01-30 | Disposition: A | Discharge status: Home / Self Care | Payer: BC Managed Care – PPO | Source: Ambulatory Visit | Attending: Endocrinology | Admitting: Endocrinology

## 2011-01-30 ENCOUNTER — Other Ambulatory Visit (HOSPITAL_COMMUNITY): Payer: Self-pay | Admitting: Endocrinology

## 2011-01-30 DIAGNOSIS — E0590 Thyrotoxicosis, unspecified without thyrotoxic crisis or storm: Secondary | ICD-10-CM

## 2011-01-31 ENCOUNTER — Encounter (HOSPITAL_COMMUNITY): Payer: Self-pay

## 2011-01-31 ENCOUNTER — Encounter (HOSPITAL_COMMUNITY)
Admission: RE | Admit: 2011-01-31 | Discharge: 2011-01-31 | Disposition: A | Discharge status: Home / Self Care | Payer: BC Managed Care – PPO | Source: Ambulatory Visit | Attending: Endocrinology | Admitting: Endocrinology

## 2011-01-31 HISTORY — DX: Thyrotoxicosis, unspecified without thyrotoxic crisis or storm: E05.90

## 2011-01-31 MED ORDER — SODIUM IODIDE I 131 CAPSULE
6.4000 | Freq: Once | INTRAVENOUS | Status: AC | PRN
  Administered 2011-01-30: 6.4 via ORAL

## 2011-01-31 MED ORDER — SODIUM PERTECHNETATE TC 99M INJECTION
10.2000 | Freq: Once | INTRAVENOUS | Status: AC | PRN
  Administered 2011-01-31: 10.2 via INTRAVENOUS

## 2012-03-22 ENCOUNTER — Emergency Department (HOSPITAL_COMMUNITY)
Admission: EM | Admit: 2012-03-22 | Discharge: 2012-03-22 | Disposition: A | Discharge status: Home / Self Care | Payer: BC Managed Care – PPO | Attending: Emergency Medicine | Admitting: Emergency Medicine

## 2012-03-22 ENCOUNTER — Emergency Department (HOSPITAL_COMMUNITY): Payer: BC Managed Care – PPO

## 2012-03-22 ENCOUNTER — Encounter (HOSPITAL_COMMUNITY): Payer: Self-pay | Admitting: Emergency Medicine

## 2012-03-22 DIAGNOSIS — R079 Chest pain, unspecified: Secondary | ICD-10-CM

## 2012-03-22 DIAGNOSIS — R0602 Shortness of breath: Secondary | ICD-10-CM | POA: Insufficient documentation

## 2012-03-22 DIAGNOSIS — Z79899 Other long term (current) drug therapy: Secondary | ICD-10-CM | POA: Insufficient documentation

## 2012-03-22 DIAGNOSIS — R61 Generalized hyperhidrosis: Secondary | ICD-10-CM | POA: Insufficient documentation

## 2012-03-22 DIAGNOSIS — F17200 Nicotine dependence, unspecified, uncomplicated: Secondary | ICD-10-CM | POA: Insufficient documentation

## 2012-03-22 DIAGNOSIS — R000 Tachycardia, unspecified: Secondary | ICD-10-CM | POA: Insufficient documentation

## 2012-03-22 DIAGNOSIS — I4891 Unspecified atrial fibrillation: Secondary | ICD-10-CM | POA: Insufficient documentation

## 2012-03-22 DIAGNOSIS — Z8639 Personal history of other endocrine, nutritional and metabolic disease: Secondary | ICD-10-CM | POA: Insufficient documentation

## 2012-03-22 DIAGNOSIS — Z862 Personal history of diseases of the blood and blood-forming organs and certain disorders involving the immune mechanism: Secondary | ICD-10-CM | POA: Insufficient documentation

## 2012-03-22 LAB — CBC WITH DIFFERENTIAL/PLATELET
Basophils Relative: 0 % (ref 0–1)
Eosinophils Absolute: 0.1 10*3/uL (ref 0.0–0.7)
Eosinophils Relative: 1 % (ref 0–5)
Lymphs Abs: 1.3 10*3/uL (ref 0.7–4.0)
MCH: 29.5 pg (ref 26.0–34.0)
MCHC: 34.2 g/dL (ref 30.0–36.0)
MCV: 86.2 fL (ref 78.0–100.0)
Monocytes Relative: 3 % (ref 3–12)
Neutrophils Relative %: 85 % — ABNORMAL HIGH (ref 43–77)
Platelets: 167 10*3/uL (ref 150–400)
RBC: 5.09 MIL/uL (ref 4.22–5.81)

## 2012-03-22 LAB — COMPREHENSIVE METABOLIC PANEL
Albumin: 4 g/dL (ref 3.5–5.2)
BUN: 32 mg/dL — ABNORMAL HIGH (ref 6–23)
Calcium: 9.7 mg/dL (ref 8.4–10.5)
GFR calc Af Amer: 54 mL/min — ABNORMAL LOW (ref >90)
Glucose, Bld: 118 mg/dL — ABNORMAL HIGH (ref 70–99)
Total Protein: 7.5 g/dL (ref 6.0–8.3)

## 2012-03-22 LAB — TROPONIN I: Troponin I: <0.3 ng/mL (ref <0.30)

## 2012-03-22 MED ORDER — TRAMADOL HCL 50 MG PO TABS: 50.0000 mg | ORAL_TABLET | Freq: Four times a day (QID) | ORAL | Status: DC | PRN

## 2012-03-22 NOTE — ED Notes (Signed)
Patient is comfortable at this time. 

## 2012-03-22 NOTE — ED Provider Notes (Signed)
History  This chart was scribed for Benny Lennert, MD by Ardeen Jourdain, ED Scribe. This patient was seen in room APA06/APA06 and the patient's care was started at 1032.  CSN: [id number]  Arrival date & time 03/22/12  0958   First MD Initiated Contact with Patient 03/22/12 1032      Chief Complaint  Patient presents with  . Chest Pain     Patient is a 62 y.o. male presenting with chest pain. The history is provided by the patient. No language interpreter was used.  Chest Pain The chest pain began 3 - 5 days ago. Chest pain occurs intermittently. The chest pain is worsening. The severity of the pain is moderate. Pertinent negatives for primary symptoms include no fever, no fatigue, no cough and no abdominal pain.  Associated symptoms include diaphoresis.  Pertinent negatives for past medical history include no seizures.     Alan [name] is a 62 y.o. male who presents to the Emergency Department complaining of intermittent CP that began 3 days ago and has been gradually worsening. He has associated SOB and "gets clammy when the pain comes."   Past Medical History  Diagnosis Date  . Tachycardia   . Hyperlipemia   . Paroxysmal atrial fibrillation   . Hyperthyroidism     History reviewed. No pertinent past surgical history.  Family History  Problem Relation Age of Onset  . Hypertension Father   . Dementia Father     died age 59  . Heart failure Other     History  Substance Use Topics  . Smoking status: Current Every Day Smoker    Types: Cigars  . Smokeless tobacco: Never Used  . Alcohol Use: Yes     Comment: occasional      Review of Systems  Constitutional: Positive for diaphoresis. Negative for fever and fatigue.  HENT: Negative for congestion, sinus pressure and ear discharge.   Eyes: Negative for discharge.  Respiratory: Negative for cough.   Cardiovascular: Positive for chest pain.  Gastrointestinal: Negative for abdominal pain and diarrhea.   Genitourinary: Negative for frequency and hematuria.  Musculoskeletal: Negative for back pain.  Skin: Negative for rash.  Neurological: Negative for seizures and headaches.  Hematological: Negative.   Psychiatric/Behavioral: Negative for hallucinations.    Allergies  Review of patient's allergies indicates no known allergies.  Home Medications   Current Outpatient Rx  Name  Route  Sig  Dispense  Refill  . METOPROLOL TARTRATE 25 MG PO TABS   Oral   Take 0.5 tablets (12.5 mg total) by mouth 2 (two) times daily.   30 tablet   0     Triage Vitals: BP 104/64  Pulse 88  Temp 97.6 F (36.4 C) (Oral)  Resp 20  Ht 5\' 5"  (1.651 m)  Wt 175 lb (79.379 kg)  BMI 29.12 kg/m2  SpO2 96%  Physical Exam  Nursing note and vitals reviewed. Constitutional: He is oriented to person, place, and time. He appears well-developed and well-nourished. No distress.  HENT:  Head: Normocephalic and atraumatic.  Eyes: Conjunctivae normal and EOM are normal. No scleral icterus.  Neck: Normal range of motion. Neck supple. No thyromegaly present.  Cardiovascular: Normal rate, regular rhythm and normal heart sounds.  Exam reveals no gallop and no friction rub.   No murmur heard. Pulmonary/Chest: Effort normal and breath sounds normal. No stridor. No respiratory distress. He has no wheezes. He has no rales. He exhibits no tenderness.  Abdominal: Soft. Bowel sounds are  normal. He exhibits no distension. There is no tenderness. There is no rebound.  Musculoskeletal: Normal range of motion. He exhibits no edema.  Lymphadenopathy:    He has no cervical adenopathy.  Neurological: He is oriented to person, place, and time. Coordination normal.  Skin: No rash noted. No erythema.  Psychiatric: He has a normal mood and affect. His behavior is normal.    ED Course  Procedures (including critical care time)  DIAGNOSTIC STUDIES: Oxygen Saturation is 96% on room air, normal by my interpretation.     COORDINATION OF CARE:  10:41 AM: Discussed treatment plan which includes an EKG with pt at bedside and pt agreed to plan.    Results for orders placed during the hospital encounter of 03/22/12  TROPONIN I      Component Value Range   Troponin I <0.30  <0.30 ng/mL  CBC WITH DIFFERENTIAL      Component Value Range   WBC 11.3 (*) 4.0 - 10.5 K/uL   RBC 5.09  4.22 - 5.81 MIL/uL   Hemoglobin 15.0  13.0 - 17.0 g/dL   HCT 09.8  11.9 - 14.7 %   MCV 86.2  78.0 - 100.0 fL   MCH 29.5  26.0 - 34.0 pg   MCHC 34.2  30.0 - 36.0 g/dL   RDW 82.9  56.2 - 13.0 %   Platelets 167  150 - 400 K/uL   Neutrophils Relative 85 (*) 43 - 77 %   Neutro Abs 9.6 (*) 1.7 - 7.7 K/uL   Lymphocytes Relative 12  12 - 46 %   Lymphs Abs 1.3  0.7 - 4.0 K/uL   Monocytes Relative 3  3 - 12 %   Monocytes Absolute 0.3  0.1 - 1.0 K/uL   Eosinophils Relative 1  0 - 5 %   Eosinophils Absolute 0.1  0.0 - 0.7 K/uL   Basophils Relative 0  0 - 1 %   Basophils Absolute 0.0  0.0 - 0.1 K/uL  COMPREHENSIVE METABOLIC PANEL      Component Value Range   Sodium 137  135 - 145 mEq/L   Potassium 4.1  3.5 - 5.1 mEq/L   Chloride 104  96 - 112 mEq/L   CO2 24  19 - 32 mEq/L   Glucose, Bld 118 (*) 70 - 99 mg/dL   BUN 32 (*) 6 - 23 mg/dL   Creatinine, Ser 8.65 (*) 0.50 - 1.35 mg/dL   Calcium 9.7  8.4 - 78.4 mg/dL   Total Protein 7.5  6.0 - 8.3 g/dL   Albumin 4.0  3.5 - 5.2 g/dL   AST 18  0 - 37 U/L   ALT 20  0 - 53 U/L   Alkaline Phosphatase 66  39 - 117 U/L   Total Bilirubin 0.4  0.3 - 1.2 mg/dL   GFR calc non Af Amer 46 (*) >90 mL/min   GFR calc Af Amer 54 (*) >90 mL/min   Dg Chest 2 View  03/22/2012  *RADIOLOGY REPORT*  Clinical Data: Left chest pain.  CHEST - 2 VIEW  Comparison: 12/25/2010.  Findings: Trachea is midline.  Heart size stable.  Lungs are clear. No pleural fluid.  Degenerative changes are seen in the spine.  IMPRESSION: No acute findings.   Original Report Authenticated By: Leanna Battles, M.D.     Date:  03/22/2012  Rate: 84  Rhythm: normal sinus rhythm  QRS Axis: normal  Intervals: normal  ST/T Wave abnormalities: normal  Conduction Disutrbances:nonspecific intraventricular conduction  delay  Narrative Interpretation:   Old EKG Reviewed: unchanged      No diagnosis found.    MDM        The chart was scribed for me under my direct supervision.  I personally performed the history, physical, and medical decision making and all procedures in the evaluation of this patient.Benny Lennert, MD 03/22/12 [phone]

## 2012-03-22 NOTE — ED Notes (Signed)
Patient c/o intermittent chest left chest pain x3 days. Denies any at this time. Per family member patient "gets clammy" when pain comes. Patient does report shortness of breath.

## 2012-03-22 NOTE — ED Notes (Signed)
Patient does not need anything at this time. 

## 2012-03-22 NOTE — Discharge Instructions (Signed)
Take one baby aspirin a day and follow up with your cardiologist or your family md this week.

## 2012-03-22 NOTE — ED Notes (Signed)
MD at bedside. 

## 2016-04-03 ENCOUNTER — Emergency Department (HOSPITAL_COMMUNITY)
Admission: EM | Admit: 2016-04-03 | Discharge: 2016-04-04 | Disposition: A | Discharge status: Home / Self Care | Payer: Medicare Other | Attending: Emergency Medicine | Admitting: Emergency Medicine

## 2016-04-03 ENCOUNTER — Encounter (HOSPITAL_COMMUNITY): Payer: Self-pay

## 2016-04-03 DIAGNOSIS — M5116 Intervertebral disc disorders with radiculopathy, lumbar region: Secondary | ICD-10-CM | POA: Insufficient documentation

## 2016-04-03 DIAGNOSIS — F17290 Nicotine dependence, other tobacco product, uncomplicated: Secondary | ICD-10-CM | POA: Diagnosis not present

## 2016-04-03 DIAGNOSIS — M4726 Other spondylosis with radiculopathy, lumbar region: Secondary | ICD-10-CM | POA: Diagnosis not present

## 2016-04-03 DIAGNOSIS — M545 Low back pain: Secondary | ICD-10-CM | POA: Diagnosis present

## 2016-04-03 DIAGNOSIS — M5136 Other intervertebral disc degeneration, lumbar region: Secondary | ICD-10-CM

## 2016-04-03 DIAGNOSIS — M5432 Sciatica, left side: Secondary | ICD-10-CM

## 2016-04-03 DIAGNOSIS — I10 Essential (primary) hypertension: Secondary | ICD-10-CM | POA: Insufficient documentation

## 2016-04-03 HISTORY — DX: Essential (primary) hypertension: I10

## 2016-04-03 NOTE — ED Notes (Signed)
Patient states that he does not have a family doctor at this time and that is why he does not have medication for his blood pressure.

## 2016-04-03 NOTE — ED Triage Notes (Addendum)
Patient states that he is hurting in the left lower back and having pain down his left leg.  Started a week and a half ago on Monday.  No injury to his back.  I was getting out of the bed and it started.  Denies problems with urination or bowels.  Patient states that he has not taken blood pressure medications for a long time.

## 2016-04-04 ENCOUNTER — Emergency Department (HOSPITAL_COMMUNITY): Payer: Medicare Other

## 2016-04-04 MED ORDER — HYDROCODONE-ACETAMINOPHEN 5-325 MG PO TABS
2.0000 | ORAL_TABLET | Freq: Once | ORAL | Status: AC
  Administered 2016-04-04: 2 via ORAL
  Filled 2016-04-04: qty 2

## 2016-04-04 MED ORDER — CYCLOBENZAPRINE HCL 10 MG PO TABS: 10.0000 mg | ORAL_TABLET | Freq: Two times a day (BID) | ORAL | 0 refills | Status: DC | PRN

## 2016-04-04 MED ORDER — HYDROCODONE-ACETAMINOPHEN 5-325 MG PO TABS: 1.0000 | ORAL_TABLET | Freq: Once | ORAL | Status: DC

## 2016-04-04 MED ORDER — HYDROCODONE-ACETAMINOPHEN 5-325 MG PO TABS: 1.0000 | ORAL_TABLET | ORAL | 0 refills | Status: DC | PRN

## 2016-04-04 MED ORDER — PREDNISONE 10 MG PO TABS: ORAL_TABLET | ORAL | 0 refills | Status: DC

## 2016-04-04 MED ORDER — LISINOPRIL-HYDROCHLOROTHIAZIDE 20-12.5 MG PO TABS: 1.0000 | ORAL_TABLET | Freq: Every day | ORAL | 0 refills | Status: DC

## 2016-04-04 NOTE — ED Provider Notes (Signed)
[facility] DEPT Provider Note   CSN: [id number] Arrival date & time: 04/03/16  1952     History   Chief Complaint Chief Complaint  Patient presents with  . Back Pain    HPI Alan [name] is a 66 y.o. male presenting with a flare of his sciatica. He reports occasional episodes of lower back pain with left radiculopathy and developed a recurrence, quite suddenly when he was simply kicking his covers off to get out of bed about 1.5 weeks ago. There has been no weakness or numbness in the lower extremities and no urinary or bowel retention or incontinence.  Patient does not have a history of cancer or IVDU.  The patient has tried icy hot, tylenol and Doan's back pain pills without significant relief of symptoms.  Additionally, he states he has been off of his bp medications for at least the past 2 years.  He states he avoids going to the doctor and has just not followed up.  He was taking metoprolol 12.5 mg bid and lisinopril-hctz 20-12.5 daily.  He denies headaches, vision changes, cp and sob.  Marland Kitchen  HPI  Past Medical History:  Diagnosis Date  . Hyperlipemia   . Hypertension   . Hyperthyroidism   . Paroxysmal atrial fibrillation (HCC)   . Tachycardia     Patient Active Problem List   Diagnosis Date Noted  . Atrial fibrillation with rapid ventricular response ([place]) 12/25/2010  . Hyperlipidemia 12/25/2010  . Hyperglycemia 12/25/2010  . Noncompliance with medications 12/25/2010  . Tobacco abuse 12/25/2010    History reviewed. No pertinent surgical history.     Home Medications    Prior to Admission medications   Medication Sig Start Date End Date Taking? Authorizing Provider  cyclobenzaprine (FLEXERIL) 10 MG tablet Take 1 tablet (10 mg total) by mouth 2 (two) times daily as needed for muscle spasms. 04/04/16   Evalee Jefferson, PA-C  HYDROcodone-acetaminophen (NORCO/VICODIN) 5-325 MG tablet Take 1 tablet by mouth every 4 (four) hours as needed. 04/04/16   Evalee Jefferson, PA-C    lisinopril-hydrochlorothiazide (PRINZIDE,ZESTORETIC) 20-12.5 MG tablet Take 1 tablet by mouth daily. 04/04/16   Evalee Jefferson, PA-C  metoprolol tartrate (LOPRESSOR) 25 MG tablet Take 12.5 mg by mouth 2 (two) times daily.    Historical Provider, MD  predniSONE (DELTASONE) 10 MG tablet Take 6 tablets day one, 5 tablets day two, 4 tablets day three, 3 tablets day four, 2 tablets day five, then 1 tablet day six 04/04/16   Evalee Jefferson, PA-C  traMADol (ULTRAM) 50 MG tablet Take 1 tablet (50 mg total) by mouth every 6 (six) hours as needed for pain. 03/22/12   Milton Ferguson, MD    Family History Family History  Problem Relation Age of Onset  . Hypertension Father   . Dementia Father     died age 60  . Heart failure Other     Social History Social History  Substance Use Topics  . Smoking status: Current Every Day Smoker    Types: Cigars  . Smokeless tobacco: Never Used  . Alcohol use Yes     Comment: occasional     Allergies   Patient has no known allergies.   Review of Systems Review of Systems  Constitutional: Negative for fever.  Eyes: Negative for visual disturbance.  Respiratory: Negative for shortness of breath.   Cardiovascular: Negative for chest pain and leg swelling.  Gastrointestinal: Negative for abdominal distention, abdominal pain and constipation.  Genitourinary: Negative for difficulty urinating, dysuria, flank  pain, frequency and urgency.  Musculoskeletal: Positive for back pain. Negative for gait problem and joint swelling.  Skin: Negative for rash.  Neurological: Negative for weakness, numbness and headaches.     Physical Exam Updated Vital Signs BP 157/85   Pulse 66   Temp 99.6 F (37.6 C) (Oral)   Resp 20   Ht '5\' 5"'$  (1.651 m)   Wt 90.7 kg   SpO2 98%   BMI 33.28 kg/m   Physical Exam  Constitutional: He appears well-developed and well-nourished.  HENT:  Head: Normocephalic.  Eyes: Conjunctivae are normal.  Neck: Normal range of motion. Neck supple.   Cardiovascular: Normal rate and intact distal pulses.   Pedal pulses normal.  Pulmonary/Chest: Effort normal.  Abdominal: Soft. Bowel sounds are normal. He exhibits no distension and no mass.  Musculoskeletal: Normal range of motion. He exhibits no edema.       Lumbar back: He exhibits tenderness. He exhibits no swelling, no edema and no spasm.       Back:  Neurological: He is alert. He has normal strength. He displays no atrophy and no tremor. No sensory deficit. Gait normal.  Reflex Scores:      Patellar reflexes are 2+ on the right side and 2+ on the left side.      Achilles reflexes are 2+ on the right side and 2+ on the left side. No strength deficit noted in hip and knee flexor and extensor muscle groups.  Ankle flexion and extension intact.  Skin: Skin is warm and dry.  Psychiatric: He has a normal mood and affect.  Nursing note and vitals reviewed.    ED Treatments / Results  Labs (all labs ordered are listed, but only abnormal results are displayed) Labs Reviewed - No data to display  EKG  EKG Interpretation None       Radiology Dg Lumbar Spine Complete  Result Date: 04/04/2016 CLINICAL DATA:  Left lower back pain radiating down left leg x1 0.5 weeks. No known injury. EXAM: LUMBAR SPINE - COMPLETE 4+ VIEW COMPARISON:  None. FINDINGS: Five non ribbed lumbar vertebrae. Normal lumbar segmentation. Flowing osteophytes along the anterior aspect of the lower thoracic spine from T9 through T12 with slight anterior height loss of T11, age indeterminate. Minimal retropulsed appearance of the posterosuperior aspect of T11. There is thoracolumbar spondylosis with mild-to-moderate disc space narrowing at all levels most marked at L4-5. No spondylolysis or spondylolisthesis. There is facet sclerosis and hypertrophy at L4-5 and L5-S1. There appears to be neural foraminal encroachment from osteophytes at L4-5. IMPRESSION: Age-indeterminate mild moderate T11 compression Lumbar  spondylosis with multilevel degenerative disc disease most prominent at L4-5 with associated neural foraminal encroachment. Electronically Signed   By: Ashley Royalty M.D.   On: 04/04/2016 01:20    Procedures Procedures (including critical care time)  Medications Ordered in ED Medications  HYDROcodone-acetaminophen (NORCO/VICODIN) 5-325 MG per tablet 2 tablet (2 tablets Oral Given 04/04/16 0039)     Initial Impression / Assessment and Plan / ED Course  I have reviewed the triage vital signs and the nursing notes.  Pertinent labs & imaging results that were available during my care of the patient were reviewed by me and considered in my medical decision making (see chart for details).     No neuro deficit on exam or by history to suggest emergent or surgical presentation.  Discussed worsened sx that should prompt immediate re-evaluation including distal weakness, bowel/bladder retention/incontinence. Pt also placed back on his lisinopril/hctz. Advised he  needs f/u with pcp within 1 month for refills, but discussed recheck sooner for any worsened pain or pain not improved with todays tx.         Final Clinical Impressions(s) / ED Diagnoses   Final diagnoses:  Sciatica of left side  Osteoarthritis of spine with radiculopathy, lumbar region  DDD (degenerative disc disease), lumbar  Essential hypertension    New Prescriptions New Prescriptions   CYCLOBENZAPRINE (FLEXERIL) 10 MG TABLET    Take 1 tablet (10 mg total) by mouth 2 (two) times daily as needed for muscle spasms.   HYDROCODONE-ACETAMINOPHEN (NORCO/VICODIN) 5-325 MG TABLET    Take 1 tablet by mouth every 4 (four) hours as needed.   PREDNISONE (DELTASONE) 10 MG TABLET    Take 6 tablets day one, 5 tablets day two, 4 tablets day three, 3 tablets day four, 2 tablets day five, then 1 tablet day six     Evalee Jefferson, PA-C 04/04/16 0147    Dorie Rank, MD 04/05/16 [phone]

## 2016-04-04 NOTE — Discharge Instructions (Signed)
Do not drive within 4 hours of taking hydrocodone as this will make you drowsy.  Avoid lifting,  Bending,  Twisting or any other activity that worsens your pain over the next week.  Apply an  icepack  to your lower back for 10-15 minutes every 2 hours for the next 2 days.  You should get rechecked if your symptoms are not better over the next 5 days,  Or you develop increased pain,  Weakness in your leg(s) or loss of bladder or bowel function - these can be symptoms of a worsening condition.

## 2017-07-08 ENCOUNTER — Encounter (HOSPITAL_COMMUNITY): Payer: Self-pay | Admitting: *Deleted

## 2017-07-08 ENCOUNTER — Emergency Department (HOSPITAL_COMMUNITY)
Admission: EM | Admit: 2017-07-08 | Discharge: 2017-07-09 | Disposition: A | Discharge status: Home / Self Care | Payer: Medicare Other | Attending: Emergency Medicine | Admitting: Emergency Medicine

## 2017-07-08 ENCOUNTER — Emergency Department (HOSPITAL_COMMUNITY): Payer: Medicare Other

## 2017-07-08 DIAGNOSIS — Y999 Unspecified external cause status: Secondary | ICD-10-CM | POA: Insufficient documentation

## 2017-07-08 DIAGNOSIS — X500XXA Overexertion from strenuous movement or load, initial encounter: Secondary | ICD-10-CM | POA: Diagnosis not present

## 2017-07-08 DIAGNOSIS — Z87891 Personal history of nicotine dependence: Secondary | ICD-10-CM | POA: Diagnosis not present

## 2017-07-08 DIAGNOSIS — Y93H2 Activity, gardening and landscaping: Secondary | ICD-10-CM | POA: Diagnosis not present

## 2017-07-08 DIAGNOSIS — M170 Bilateral primary osteoarthritis of knee: Secondary | ICD-10-CM | POA: Diagnosis not present

## 2017-07-08 DIAGNOSIS — Z79899 Other long term (current) drug therapy: Secondary | ICD-10-CM | POA: Insufficient documentation

## 2017-07-08 DIAGNOSIS — S8392XA Sprain of unspecified site of left knee, initial encounter: Secondary | ICD-10-CM | POA: Insufficient documentation

## 2017-07-08 DIAGNOSIS — M25462 Effusion, left knee: Secondary | ICD-10-CM | POA: Diagnosis not present

## 2017-07-08 DIAGNOSIS — Y92007 Garden or yard of unspecified non-institutional (private) residence as the place of occurrence of the external cause: Secondary | ICD-10-CM | POA: Insufficient documentation

## 2017-07-08 DIAGNOSIS — I10 Essential (primary) hypertension: Secondary | ICD-10-CM | POA: Diagnosis not present

## 2017-07-08 DIAGNOSIS — S8992XA Unspecified injury of left lower leg, initial encounter: Secondary | ICD-10-CM | POA: Diagnosis present

## 2017-07-08 MED ORDER — PREDNISONE 20 MG PO TABS
40.0000 mg | ORAL_TABLET | Freq: Once | ORAL | Status: AC
  Administered 2017-07-09: 40 mg via ORAL
  Filled 2017-07-08: qty 2

## 2017-07-08 MED ORDER — TRAMADOL HCL 50 MG PO TABS
100.0000 mg | ORAL_TABLET | Freq: Once | ORAL | Status: AC
  Administered 2017-07-09: 100 mg via ORAL
  Filled 2017-07-08: qty 2

## 2017-07-08 NOTE — ED Provider Notes (Signed)
[facility] EMERGENCY DEPARTMENT Provider Note   CSN: [id number] Arrival date & time: 07/08/17  2147     History   Chief Complaint Chief Complaint  Patient presents with  . Knee Pain    HPI Alan [name] is a 67 y.o. male.   Knee Pain   This is a new problem. The current episode started more than 1 week ago. The problem occurs daily. The problem has been gradually worsening. The pain is present in the left knee. Quality: alternates sharpe and dull. The pain is moderate. Associated symptoms include stiffness. Exacerbated by: walking. He has tried cold (arthritis pain rub.) for the symptoms.    Past Medical History:  Diagnosis Date  . Hyperlipemia   . Hypertension   . Hyperthyroidism   . Paroxysmal atrial fibrillation (HCC)   . Tachycardia     Patient Active Problem List   Diagnosis Date Noted  . Atrial fibrillation with rapid ventricular response ([place]) 12/25/2010  . Hyperlipidemia 12/25/2010  . Hyperglycemia 12/25/2010  . Noncompliance with medications 12/25/2010  . Tobacco abuse 12/25/2010    History reviewed. No pertinent surgical history.      Home Medications    Prior to Admission medications   Medication Sig Start Date End Date Taking? Authorizing Provider  cyclobenzaprine (FLEXERIL) 10 MG tablet Take 1 tablet (10 mg total) by mouth 2 (two) times daily as needed for muscle spasms. 04/04/16   Evalee Jefferson, PA-C  HYDROcodone-acetaminophen (NORCO/VICODIN) 5-325 MG tablet Take 1 tablet by mouth every 4 (four) hours as needed. 04/04/16   Evalee Jefferson, PA-C  lisinopril-hydrochlorothiazide (PRINZIDE,ZESTORETIC) 20-12.5 MG tablet Take 1 tablet by mouth daily. 04/04/16   Evalee Jefferson, PA-C  metoprolol tartrate (LOPRESSOR) 25 MG tablet Take 12.5 mg by mouth 2 (two) times daily.    [provider]  predniSONE (DELTASONE) 10 MG tablet Take 6 tablets day one, 5 tablets day two, 4 tablets day three, 3 tablets day four, 2 tablets day five, then 1 tablet day six 04/04/16    Idol, Almyra Free, PA-C  traMADol (ULTRAM) 50 MG tablet Take 1 tablet (50 mg total) by mouth every 6 (six) hours as needed for pain. 03/22/12   Milton Ferguson, MD    Family History Family History  Problem Relation Age of Onset  . Hypertension Father   . Dementia Father        died age 12  . Heart failure Other     Social History Social History   Tobacco Use  . Smoking status: Former Smoker    Types: Cigars  . Smokeless tobacco: Never Used  Substance Use Topics  . Alcohol use: Yes    Comment: occasional  . Drug use: No     Allergies   Patient has no known allergies.   Review of Systems Review of Systems  Constitutional: Negative for activity change.       All ROS Neg except as noted in HPI  HENT: Negative for nosebleeds.   Eyes: Negative for photophobia and discharge.  Respiratory: Negative for cough, shortness of breath and wheezing.   Cardiovascular: Negative for chest pain and palpitations.  Gastrointestinal: Negative for abdominal pain and blood in stool.  Genitourinary: Negative for dysuria, frequency and hematuria.  Musculoskeletal: Positive for arthralgias and stiffness. Negative for back pain and neck pain.  Skin: Negative.   Neurological: Negative for dizziness, seizures and speech difficulty.  Psychiatric/Behavioral: Negative for confusion and hallucinations.     Physical Exam Updated Vital Signs BP (!) 193/61 (BP  Location: Right Arm)   Pulse 76   Temp 97.8 F (36.6 C) (Oral)   Resp 20   Ht 5\' 5"  (1.651 m)   Wt 88.5 kg (195 lb)   SpO2 98%   BMI 32.45 kg/m   Physical Exam  Constitutional: He is oriented to person, place, and time. He appears well-developed and well-nourished.  Non-toxic appearance.  HENT:  Head: Normocephalic.  Right Ear: Tympanic membrane and external ear normal.  Left Ear: Tympanic membrane and external ear normal.  Eyes: Pupils are equal, round, and reactive to light. EOM and lids are normal.  Neck: Normal range of motion. Neck  supple. Carotid bruit is not present.  Cardiovascular: Normal rate, regular rhythm, normal heart sounds, intact distal pulses and normal pulses.  Pulmonary/Chest: Breath sounds normal. No respiratory distress.  Abdominal: Soft. Bowel sounds are normal. There is no tenderness. There is no guarding.  Musculoskeletal: Normal range of motion.       Left knee: He exhibits swelling. Tenderness found. Medial joint line tenderness noted.  Lymphadenopathy:       Head (right side): No submandibular adenopathy present.       Head (left side): No submandibular adenopathy present.    He has no cervical adenopathy.  Neurological: He is alert and oriented to person, place, and time. He has normal strength. No cranial nerve deficit or sensory deficit.  Skin: Skin is warm and dry.  Psychiatric: He has a normal mood and affect. His speech is normal.  Nursing note and vitals reviewed.    ED Treatments / Results  Labs (all labs ordered are listed, but only abnormal results are displayed) Labs Reviewed - No data to display  EKG None  Radiology Dg Knee Complete 4 Views Left  Result Date: 07/08/2017 CLINICAL DATA:  Medial left knee pain after twisting injury while mowing the yard. EXAM: LEFT KNEE - COMPLETE 4+ VIEW COMPARISON:  None. FINDINGS: Degenerative joint space narrowing of the patellofemoral and femorotibial compartments with moderate joint effusion. No evidence of arthropathy or other focal bone abnormality. Vascular phleboliths are noted anterior to the knee and along the medial aspect of the proximal left leg. IMPRESSION: Moderate suprapatellar joint effusion without fracture or joint dislocation. Mild femorotibial joint space narrowing of the knee. Electronically Signed   By: Ashley Royalty M.D.   On: 07/08/2017 22:21    Procedures Procedures (including critical care time)  Medications Ordered in ED Medications - No data to display   Initial Impression / Assessment and Plan / ED Course  I  have reviewed the triage vital signs and the nursing notes.  Pertinent labs & imaging results that were available during my care of the patient were reviewed by me and considered in my medical decision making (see chart for details).       Final Clinical Impressions(s) / ED Diagnoses MDM  Blood pressure is elevated at 193/61, otherwise vital signs within normal limits.  The pulse oximetry is 98% on room air.  Within normal limits by my interpretation.  Patient presented to the emergency department with knee pain after twisting his knee while mowing the lawn.  No gross neurovascular deficits appreciated.  No previous operations recently.  The x-ray shows moderate suprapatellar joint effusion.  There is no fracture and no dislocation.  There is mild femoral tibial joint space narrowing also noted of the left knee.  Patient fitted with a knee sleeve.   Final diagnoses:  Effusion of left knee  Sprain of left knee,  unspecified ligament, initial encounter  Primary osteoarthritis of both knees    ED Discharge Orders        Ordered    dexamethasone (DECADRON) 4 MG tablet  2 times daily with meals     07/09/17 0046    traMADol (ULTRAM) 50 MG tablet  Every 6 hours PRN     07/09/17 0046       Lily Kocher, PA-C 07/09/17 [id number]    Veryl Speak, MD 07/09/17 [phone]

## 2017-07-08 NOTE — ED Triage Notes (Signed)
Pt reports left knee pain after twisting his knee while mowing the yard.

## 2017-07-09 MED ORDER — TRAMADOL HCL 50 MG PO TABS: 50.0000 mg | ORAL_TABLET | Freq: Four times a day (QID) | ORAL | 0 refills | Status: DC | PRN

## 2017-07-09 MED ORDER — DEXAMETHASONE 4 MG PO TABS: 4.0000 mg | ORAL_TABLET | Freq: Two times a day (BID) | ORAL | 0 refills | Status: DC

## 2017-07-09 NOTE — Discharge Instructions (Signed)
Your x-ray and your examination suggest advanced arthritis with fluid present in the joint.  Please use Decadron 2 times daily with food.  Use Tylenol extra strength for mild pain, use Ultram for more severe pain.  Please see Dr. Aline Brochure for orthopedic evaluation and management of your pain and the fluid in the joint.

## 2017-08-12 ENCOUNTER — Ambulatory Visit (INDEPENDENT_AMBULATORY_CARE_PROVIDER_SITE_OTHER): Payer: Medicare Other | Admitting: Orthopaedic Surgery

## 2017-08-12 ENCOUNTER — Encounter: Payer: Self-pay | Admitting: Orthopaedic Surgery

## 2017-08-12 VITALS — BP 191/68 | HR 56 | Temp 98.0°F | Ht 65.0 in | Wt 193.0 lb

## 2017-08-12 DIAGNOSIS — G8929 Other chronic pain: Secondary | ICD-10-CM

## 2017-08-12 DIAGNOSIS — M25562 Pain in left knee: Secondary | ICD-10-CM

## 2017-08-12 DIAGNOSIS — Z96652 Presence of left artificial knee joint: Secondary | ICD-10-CM | POA: Diagnosis not present

## 2017-08-12 NOTE — Progress Notes (Signed)
Subjective:    Patient ID: Alan [name], male    DOB: [birth year], 67 y.o.   MRN: [id number]  HPI He has had right knee pain since an injury in his yard 07-08-17.  He was running across his yard and fell and twisted his left knee.  He has had swelling, popping of the knee. He has no giving way.  He was seen in the ER 07-08-17.  X-rays showed osteoarthritis moderate but no fracture.    He continues to have knee pain and swelling.  He has tried ice, heat,rubs and Advil with only slight help.  He uses a cane now.  He has no other injury.  I have reviewed the x-rays, the x-ray report and the ER records.   Review of Systems  Constitutional: Positive for activity change.  Respiratory: Negative for cough and shortness of breath.   Cardiovascular: Positive for palpitations. Negative for leg swelling.  Musculoskeletal: Positive for arthralgias, gait problem and joint swelling.  All other systems reviewed and are negative.  Past Medical History:  Diagnosis Date  . Hyperlipemia   . Hypertension   . Hyperthyroidism   . Paroxysmal atrial fibrillation (HCC)   . Tachycardia     History reviewed. No pertinent surgical history.  Current Outpatient Medications on File Prior to Visit  Medication Sig Dispense Refill  . lisinopril-hydrochlorothiazide (PRINZIDE,ZESTORETIC) 20-12.5 MG tablet Take 1 tablet by mouth daily. 30 tablet 0  . metoprolol tartrate (LOPRESSOR) 25 MG tablet Take 12.5 mg by mouth 2 (two) times daily.    . cyclobenzaprine (FLEXERIL) 10 MG tablet Take 1 tablet (10 mg total) by mouth 2 (two) times daily as needed for muscle spasms. (Patient not taking: Reported on 08/12/2017) 20 tablet 0  . dexamethasone (DECADRON) 4 MG tablet Take 1 tablet (4 mg total) by mouth 2 (two) times daily with a meal. (Patient not taking: Reported on 08/12/2017) 10 tablet 0  . HYDROcodone-acetaminophen (NORCO/VICODIN) 5-325 MG tablet Take 1 tablet by mouth every 4 (four) hours as needed. (Patient not taking:  Reported on 08/12/2017) 20 tablet 0  . predniSONE (DELTASONE) 10 MG tablet Take 6 tablets day one, 5 tablets day two, 4 tablets day three, 3 tablets day four, 2 tablets day five, then 1 tablet day six (Patient not taking: Reported on 08/12/2017) 21 tablet 0  . traMADol (ULTRAM) 50 MG tablet Take 1 tablet (50 mg total) by mouth every 6 (six) hours as needed. (Patient not taking: Reported on 08/12/2017) 15 tablet 0   No current facility-administered medications on file prior to visit.     Social History   Socioeconomic History  . Marital status: Married    Spouse name: Not on file  . Number of children: Not on file  . Years of education: Not on file  . Highest education level: Not on file  Occupational History  . Not on file  Social Needs  . Financial resource strain: Not on file  . Food insecurity:    Worry: Not on file    Inability: Not on file  . Transportation needs:    Medical: Not on file    Non-medical: Not on file  Tobacco Use  . Smoking status: Former Smoker    Types: Cigars  . Smokeless tobacco: Never Used  Substance and Sexual Activity  . Alcohol use: Yes    Comment: occasional  . Drug use: No  . Sexual activity: Not on file  Lifestyle  . Physical activity:    Days per  week: Not on file    Minutes per session: Not on file  . Stress: Not on file  Relationships  . Social connections:    Talks on phone: Not on file    Gets together: Not on file    Attends religious service: Not on file    Active member of club or organization: Not on file    Attends meetings of clubs or organizations: Not on file    Relationship status: Not on file  . Intimate partner violence:    Fear of current or ex partner: Not on file    Emotionally abused: Not on file    Physically abused: Not on file    Forced sexual activity: Not on file  Other Topics Concern  . Not on file  Social History Narrative  . Not on file    Family History  Problem Relation Age of Onset  . Hypertension  Father   . Dementia Father        died age 52  . Heart failure Other     BP (!) 191/68   Pulse (!) 56   Temp 98 F (36.7 C)   Ht 5\' 5"  (1.651 m)   Wt 193 lb (87.5 kg)   BMI 32.12 kg/m   Body mass index is 32.12 kg/m.      Objective:   Physical Exam  Constitutional: He is oriented to person, place, and time. He appears well-developed and well-nourished.  HENT:  Head: Normocephalic and atraumatic.  Eyes: Pupils are equal, round, and reactive to light. Conjunctivae and EOM are normal.  Neck: Normal range of motion. Neck supple.  Cardiovascular: Normal rate, regular rhythm and intact distal pulses.  Pulmonary/Chest: Effort normal.  Abdominal: Soft.  Musculoskeletal:       Left knee: He exhibits decreased range of motion, swelling and effusion. Tenderness found. Medial joint line tenderness noted.       Legs: Neurological: He is alert and oriented to person, place, and time. He has normal reflexes. He displays normal reflexes. No cranial nerve deficit. He exhibits normal muscle tone. Coordination normal.  Skin: Skin is warm and dry.  Psychiatric: He has a normal mood and affect. His behavior is normal. Judgment and thought content normal.     X-rays were reviewed of the left knee.     Assessment & Plan:   Encounter Diagnosis  Name Primary?  . Chronic knee pain after total replacement of left knee joint Yes   PROCEDURE NOTE:  The patient request injection, verbal consent was obtained.  The left knee was prepped appropriately after time out was performed.   Sterile technique was observed and anesthesia was provided by ethyl chloride and a 20-gauge needle was used to inject the knee area.  A 16-gauge needle was then used to aspirate the knee.  Color of fluid aspirated was straw  Total cc's aspirated was 35.    Injection of 1 cc of Depo-Medrol 40 mg with several cc's of plain xylocaine was then performed.  A band aid dressing was applied.  The patient was  advised to apply ice later today and tomorrow to the injection sight as needed.  He is to continue his present medicine.  Return in two weeks.  Consider MRI if not improved.  Call if any problem.  Precautions discussed.   Electronically Signed Sanjuana Kava, MD 6/26/20192:52 PM

## 2017-08-25 ENCOUNTER — Ambulatory Visit: Payer: Medicare Other | Admitting: Orthopaedic Surgery

## 2017-08-25 ENCOUNTER — Encounter: Payer: Self-pay | Admitting: Orthopaedic Surgery

## 2017-08-25 VITALS — BP 179/73 | HR 52 | Temp 98.6°F | Ht 65.0 in | Wt 192.0 lb

## 2017-08-25 DIAGNOSIS — Z96652 Presence of left artificial knee joint: Secondary | ICD-10-CM

## 2017-08-25 DIAGNOSIS — G8929 Other chronic pain: Secondary | ICD-10-CM | POA: Diagnosis not present

## 2017-08-25 DIAGNOSIS — M25562 Pain in left knee: Secondary | ICD-10-CM | POA: Diagnosis not present

## 2017-08-25 NOTE — Progress Notes (Signed)
CC:  I am so much better  He has little pain of the left knee.  He has no swelling, no pain.  He is walking well.  NV intact. ROM of the left knee is 0 to 115.  He has slight crepitus and slight effusion.    Encounter Diagnosis  Name Primary?  . Chronic knee pain after total replacement of left knee joint Yes   I will see him as needed.  Call if any problem.  Precautions discussed.   Electronically Signed Sanjuana Kava, MD 7/9/20192:20 PM

## 2017-08-26 ENCOUNTER — Ambulatory Visit: Payer: Medicare Other | Admitting: Orthopaedic Surgery

## 2019-10-06 ENCOUNTER — Emergency Department (HOSPITAL_COMMUNITY): Payer: Medicare Other

## 2019-10-06 ENCOUNTER — Emergency Department (HOSPITAL_COMMUNITY)
Admission: EM | Admit: 2019-10-06 | Discharge: 2019-10-06 | Disposition: A | Discharge status: Home / Self Care | Payer: Medicare Other | Attending: Emergency Medicine | Admitting: Emergency Medicine

## 2019-10-06 ENCOUNTER — Other Ambulatory Visit: Payer: Self-pay

## 2019-10-06 ENCOUNTER — Encounter (HOSPITAL_COMMUNITY): Payer: Self-pay | Admitting: Emergency Medicine

## 2019-10-06 DIAGNOSIS — R42 Dizziness and giddiness: Secondary | ICD-10-CM | POA: Diagnosis not present

## 2019-10-06 DIAGNOSIS — R059 Cough, unspecified: Secondary | ICD-10-CM

## 2019-10-06 DIAGNOSIS — R05 Cough: Secondary | ICD-10-CM | POA: Diagnosis not present

## 2019-10-06 DIAGNOSIS — Z20822 Contact with and (suspected) exposure to covid-19: Secondary | ICD-10-CM | POA: Diagnosis not present

## 2019-10-06 DIAGNOSIS — E039 Hypothyroidism, unspecified: Secondary | ICD-10-CM | POA: Insufficient documentation

## 2019-10-06 DIAGNOSIS — R918 Other nonspecific abnormal finding of lung field: Secondary | ICD-10-CM | POA: Diagnosis not present

## 2019-10-06 DIAGNOSIS — Z79899 Other long term (current) drug therapy: Secondary | ICD-10-CM | POA: Diagnosis not present

## 2019-10-06 DIAGNOSIS — Z87891 Personal history of nicotine dependence: Secondary | ICD-10-CM | POA: Diagnosis not present

## 2019-10-06 DIAGNOSIS — I10 Essential (primary) hypertension: Secondary | ICD-10-CM | POA: Insufficient documentation

## 2019-10-06 LAB — CBC WITH DIFFERENTIAL/PLATELET
Abs Immature Granulocytes: 0.04 10*3/uL (ref 0.00–0.07)
Basophils Absolute: 0.1 10*3/uL (ref 0.0–0.1)
Basophils Relative: 1 %
Eosinophils Absolute: 0.2 10*3/uL (ref 0.0–0.5)
Eosinophils Relative: 1 %
HCT: 48.6 % (ref 39.0–52.0)
Hemoglobin: 15.6 g/dL (ref 13.0–17.0)
Immature Granulocytes: 0 %
Lymphocytes Relative: 16 %
Lymphs Abs: 2 10*3/uL (ref 0.7–4.0)
MCH: 28.1 pg (ref 26.0–34.0)
MCHC: 32.1 g/dL (ref 30.0–36.0)
MCV: 87.6 fL (ref 80.0–100.0)
Monocytes Absolute: 0.6 10*3/uL (ref 0.1–1.0)
Monocytes Relative: 5 %
Neutro Abs: 9.2 10*3/uL — ABNORMAL HIGH (ref 1.7–7.7)
Neutrophils Relative %: 77 %
Platelets: 188 10*3/uL (ref 150–400)
RBC: 5.55 MIL/uL (ref 4.22–5.81)
RDW: 13.3 % (ref 11.5–15.5)
WBC: 12.2 10*3/uL — ABNORMAL HIGH (ref 4.0–10.5)
nRBC: 0 % (ref 0.0–0.2)

## 2019-10-06 LAB — COMPREHENSIVE METABOLIC PANEL
ALT: 22 U/L (ref 0–44)
AST: 22 U/L (ref 15–41)
Albumin: 4.3 g/dL (ref 3.5–5.0)
Alkaline Phosphatase: 92 U/L (ref 38–126)
Anion gap: 9 (ref 5–15)
BUN: 18 mg/dL (ref 8–23)
CO2: 25 mmol/L (ref 22–32)
Calcium: 9.2 mg/dL (ref 8.9–10.3)
Chloride: 105 mmol/L (ref 98–111)
Creatinine, Ser: 1.09 mg/dL (ref 0.61–1.24)
GFR calc Af Amer: >60 mL/min (ref >60)
GFR calc non Af Amer: >60 mL/min (ref >60)
Glucose, Bld: 111 mg/dL — ABNORMAL HIGH (ref 70–99)
Potassium: 4.1 mmol/L (ref 3.5–5.1)
Sodium: 139 mmol/L (ref 135–145)
Total Bilirubin: 0.8 mg/dL (ref 0.3–1.2)
Total Protein: 8.3 g/dL — ABNORMAL HIGH (ref 6.5–8.1)

## 2019-10-06 LAB — SARS CORONAVIRUS 2 BY RT PCR (HOSPITAL ORDER, PERFORMED IN ~~LOC~~ HOSPITAL LAB): SARS Coronavirus 2: NEGATIVE

## 2019-10-06 MED ORDER — IOHEXOL 300 MG/ML  SOLN
75.0000 mL | Freq: Once | INTRAMUSCULAR | Status: AC | PRN
  Administered 2019-10-06: 75 mL via INTRAVENOUS

## 2019-10-06 NOTE — ED Triage Notes (Signed)
Pt c/o a productive cough for the past few weeks.

## 2019-10-06 NOTE — Discharge Instructions (Signed)
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 This will need further evaluation and management by oncology.  Call to make an appointment.  We expect that you will be seen in the office within a week.  Return to the emergency department for shortness of breath, persistent fever, chest pain, dizziness, passing out, persistent vomiting, abdominal pain, or any other major concerns.

## 2019-10-06 NOTE — ED Provider Notes (Signed)
[facility] EMERGENCY DEPARTMENT Provider Note   CSN: [id number] Arrival date & time: 10/06/19  1225     History Chief Complaint  Patient presents with  . Cough    Alan [name] is a 69 y.o. male.  HPI      Alan [name] is a 69 y.o. male, with a history of HTN, hypothyroidism, A. fib, hyperlipidemia, presenting to the ED with persistent cough for several months, possibly up to a year. Productive cough with yellow sputum beginning about a month ago. Increased fatigue over the past few months. Will occasionally experience lightheadedness with coughing spells.  It was one of these episodes which made him decide to come to the ED today. Quit smoking cigarettes at least 2 years ago.  Denies fever/chills, shortness of breath, chest pain, abdominal pain, syncope, N/V/D, lower extremity edema/pain, or any other complaints.  Past Medical History:  Diagnosis Date  . Hyperlipemia   . Hypertension   . Hyperthyroidism   . Paroxysmal atrial fibrillation (HCC)   . Tachycardia     Patient Active Problem List   Diagnosis Date Noted  . Atrial fibrillation with rapid ventricular response ([place]) 12/25/2010  . Hyperlipidemia 12/25/2010  . Hyperglycemia 12/25/2010  . Noncompliance with medications 12/25/2010  . Tobacco abuse 12/25/2010    History reviewed. No pertinent surgical history.     Family History  Problem Relation Age of Onset  . Hypertension Father   . Dementia Father        died age 36  . Heart failure Other     Social History   Tobacco Use  . Smoking status: Former Smoker    Types: Cigars  . Smokeless tobacco: Never Used  Vaping Use  . Vaping Use: Never used  Substance Use Topics  . Alcohol use: Yes    Comment: occasional  . Drug use: No    Home Medications Prior to Admission medications   Medication Sig Start Date End Date Taking? Authorizing Provider  cyclobenzaprine (FLEXERIL) 10 MG tablet Take 1 tablet (10 mg total) by mouth 2 (two) times daily  as needed for muscle spasms. Patient not taking: Reported on 08/12/2017 04/04/16   Evalee Jefferson, PA-C  dexamethasone (DECADRON) 4 MG tablet Take 1 tablet (4 mg total) by mouth 2 (two) times daily with a meal. Patient not taking: Reported on 08/12/2017 07/09/17   Lily Kocher, PA-C  HYDROcodone-acetaminophen (NORCO/VICODIN) 5-325 MG tablet Take 1 tablet by mouth every 4 (four) hours as needed. Patient not taking: Reported on 08/12/2017 04/04/16   Evalee Jefferson, PA-C  lisinopril-hydrochlorothiazide (PRINZIDE,ZESTORETIC) 20-12.5 MG tablet Take 1 tablet by mouth daily. 04/04/16   Evalee Jefferson, PA-C  metoprolol tartrate (LOPRESSOR) 25 MG tablet Take 12.5 mg by mouth 2 (two) times daily.    [provider]  predniSONE (DELTASONE) 10 MG tablet Take 6 tablets day one, 5 tablets day two, 4 tablets day three, 3 tablets day four, 2 tablets day five, then 1 tablet day six Patient not taking: Reported on 08/12/2017 04/04/16   Evalee Jefferson, PA-C  traMADol (ULTRAM) 50 MG tablet Take 1 tablet (50 mg total) by mouth every 6 (six) hours as needed. Patient not taking: Reported on 08/12/2017 07/09/17   Lily Kocher, PA-C    Allergies    Patient has no known allergies.  Review of Systems   Review of Systems  Constitutional: Negative for chills, diaphoresis and fever.  Respiratory: Positive for cough. Negative for shortness of breath.   Cardiovascular: Negative for chest pain  and leg swelling.  Gastrointestinal: Negative for abdominal pain, diarrhea, nausea and vomiting.  Neurological: Positive for light-headedness. Negative for syncope and weakness.  All other systems reviewed and are negative.   Physical Exam Updated Vital Signs BP 140/71 (BP Location: Right Arm)   Pulse 63   Temp 98 F (36.7 C) (Oral)   Resp 16   Ht 5\' 5"  (1.651 m)   Wt 86.2 kg   SpO2 100%   BMI 31.62 kg/m   Physical Exam Vitals and nursing note reviewed.  Constitutional:      General: He is not in acute distress.     Appearance: He is well-developed. He is not diaphoretic.  HENT:     Head: Normocephalic and atraumatic.     Mouth/Throat:     Mouth: Mucous membranes are moist.     Pharynx: Oropharynx is clear.  Eyes:     Conjunctiva/sclera: Conjunctivae normal.  Cardiovascular:     Rate and Rhythm: Normal rate and regular rhythm.     Pulses: Normal pulses.          Radial pulses are 2+ on the right side and 2+ on the left side.       Posterior tibial pulses are 2+ on the right side and 2+ on the left side.     Heart sounds: Normal heart sounds.     Comments: Tactile temperature in the extremities appropriate and equal bilaterally. Pulmonary:     Effort: Pulmonary effort is normal. No respiratory distress.     Breath sounds: Normal breath sounds.     Comments: No increased work of breathing. Speaks in full sentences without difficulty. Abdominal:     Palpations: Abdomen is soft.     Tenderness: There is no abdominal tenderness. There is no guarding.  Musculoskeletal:     Cervical back: Neck supple.     Right lower leg: No edema.     Left lower leg: No edema.  Lymphadenopathy:     Cervical: No cervical adenopathy.  Skin:    General: Skin is warm and dry.  Neurological:     Mental Status: He is alert.  Psychiatric:        Mood and Affect: Mood and affect normal.        Speech: Speech normal.        Behavior: Behavior normal.     ED Results / Procedures / Treatments   Labs (all labs ordered are listed, but only abnormal results are displayed) Labs Reviewed  COMPREHENSIVE METABOLIC PANEL - Abnormal; Notable for the following components:      Result Value   Glucose, Bld 111 (*)    Total Protein 8.3 (*)    All other components within normal limits  CBC WITH DIFFERENTIAL/PLATELET - Abnormal; Notable for the following components:   WBC 12.2 (*)    Neutro Abs 9.2 (*)    All other components within normal limits  SARS CORONAVIRUS 2 BY RT PCR [facility] ORDER, [place] LAB)    EKG EKG Interpretation  Date/Time:  Thursday October 06 2019 14:08:00 EDT Ventricular Rate:  67 PR Interval:  168 QRS Duration: 92 QT Interval:  384 QTC Calculation: 405 R Axis:   105 Text Interpretation: Normal sinus rhythm Right ventricular hypertrophy with repolarization abnormality Abnormal ECG No significant change since last tracing Confirmed by Dorie Rank [id number]) on 10/06/2019 5:55:39 PM   Radiology DG Chest 2 View  Result Date: 10/06/2019 CLINICAL DATA:  Persistent cough. Additional provided:  Persistent cough for 3 weeks, former smoker, atrial fibrillation, hypertension. EXAM: CHEST - 2 VIEW COMPARISON:  Prior chest radiographs 03/22/2012. FINDINGS: Heart size within normal limits. There is collapse of the left upper lobe. Superimposed pneumonia is difficult to exclude. The right lung is clear. No evidence of pleural effusion or pneumothorax. No acute bony abnormality is identified. Thoracic spondylosis. These results were called by telephone at the time of interpretation on 10/06/2019 at 5:53 pm to provider [facility] , who verbally acknowledged these results. IMPRESSION: Collapse of the left upper lobe. Contrast-enhanced chest CT is recommended for further evaluation, particularly to exclude a centrally obstructing mass. Superimposed left upper lobe pneumonia cannot be excluded. Electronically Signed   By: Kellie Simmering DO   On: 10/06/2019 17:54   CT Chest W Contrast  Result Date: 10/06/2019 CLINICAL DATA:  Abnormal chest radiograph. EXAM: CT CHEST WITH CONTRAST TECHNIQUE: Multidetector CT imaging of the chest was performed during intravenous contrast administration. CONTRAST:  45mL OMNIPAQUE IOHEXOL 300 MG/ML  SOLN COMPARISON:  Chest radiograph same day FINDINGS: Cardiovascular: There is narrowing of the LEFT pulmonary artery by a LEFT suprahilar mass. Coronary artery calcification and aortic atherosclerotic calcification. Mediastinum/Nodes: No axillary or  supraclavicular adenopathy. Large nodule of the LEFT lobe of the thyroid gland measures 2 cm. AP window nodal mass measures 3.4 cm. No supraclavicular adenopathy. Subcarinal enlarged lymph node measures 1.4 cm. Lungs/Pleura: There is a large LEFT suprahilar mass which completely obstructs the LEFT upper lobe bronchus. Mass extends into the LEFT upper lobe to the apex. Size of mass is difficult to ascertain but measures approximately 7.0 by 5.3 5.8 cm. Mass extends into the AP window and narrows the LEFT mainstem bronchus. Small subpleural nodule in the LEFT lower lobe measures 4 mm is indeterminate. Upper Abdomen: Adrenal glands normal.  No hepatic lesion identified. Musculoskeletal: No aggressive osseous lesion. Multilevel osteophytosis of thoracic spine IMPRESSION: 1. Large LEFT suprahilar mass which completely obstructing the LEFT upper lobe bronchus and extends into the LEFT upper lobe to the apex. Findings consistent with bronchogenic carcinoma. 2. Direct invasion into the the mediastinum through the AP window with e narrowing of the LEFT mainstem bronchus and LEFT pulmonary artery. 3. Subcarinal nodal metastasis. Electronically Signed   By: Suzy Bouchard M.D.   On: 10/06/2019 20:18    Procedures Procedures (including critical care time)  Medications Ordered in ED Medications  iohexol (OMNIPAQUE) 300 MG/ML solution 75 mL (75 mLs Intravenous Contrast Given 10/06/19 1953)    ED Course  I have reviewed the triage vital signs and the nursing notes.  Pertinent labs & imaging results that were available during my care of the patient were reviewed by me and considered in my medical decision making (see chart for details).  Clinical Course as of Oct 07 30  Thu Oct 06, 2019  2049 Spoke with Dr. Delton Coombes, oncology.  We discussed the patient's presenting symptoms, physical exam findings, and imaging abnormalities. I read the CT impression word for word. He states as long as patient otherwise  presents is stable, there is nothing on the CT read that would require that the patient have admission or immediate intervention. Patient can follow-up in the office within about a week.  He requests that I send him an in basket message continue the patient's information. This request was completed.   [SJ]    Clinical Course User Index [SJ] Ruble Buttler, Helane Gunther, PA-C   MDM Rules/Calculators/A&P  Patient presents with persistent cough. Patient is nontoxic appearing, afebrile, not tachycardic, not tachypneic, not hypotensive, maintains excellent SPO2 on room air, and is in no apparent distress.   I have reviewed the patient's chart to obtain more information.   I reviewed and interpreted the patient's labs and radiological studies. Mild leukocytosis, nonspecific at this time. Left lung mass noted on chest x-ray. CT with large left lung mass, suspicious for malignancy. These results were discussed with the patient and his wife. Questions were answered to the best my ability. Oncology follow-up. Referral given. Return precautions discussed. Patient voices understanding of these instructions, accepts the plan, and is comfortable with discharge.  Findings and plan of care discussed with Dorie Rank, MD.   Vitals:   10/06/19 1405 10/06/19 1406 10/06/19 1848 10/06/19 2053  BP: 140/71  (!) 158/65 (!) 176/81  Pulse: 63  (!) 57 67  Resp: 16  20 20   Temp: 98 F (36.7 C)   98.3 F (36.8 C)  TempSrc: Oral   Oral  SpO2: 100%  100% 100%  Weight:  86.2 kg    Height:  5\' 5"  (1.651 m)      Final Clinical Impression(s) / ED Diagnoses Final diagnoses:  Cough  Mass of upper lobe of left lung    Rx / DC Orders ED Discharge Orders    None       Layla Maw 10/07/19 0032    Dorie Rank, MD 10/07/19 [phone]

## 2019-10-07 ENCOUNTER — Other Ambulatory Visit (HOSPITAL_COMMUNITY): Payer: Self-pay

## 2019-10-07 DIAGNOSIS — R918 Other nonspecific abnormal finding of lung field: Secondary | ICD-10-CM

## 2019-10-07 NOTE — Progress Notes (Signed)
Order for PET placed per Dr. Delton Coombes.

## 2019-10-14 ENCOUNTER — Other Ambulatory Visit: Payer: Self-pay

## 2019-10-14 ENCOUNTER — Encounter (HOSPITAL_COMMUNITY)
Admission: RE | Admit: 2019-10-14 | Discharge: 2019-10-14 | Disposition: A | Discharge status: Home / Self Care | Payer: Medicare Other | Source: Ambulatory Visit | Attending: Hematology | Admitting: Hematology

## 2019-10-14 DIAGNOSIS — R918 Other nonspecific abnormal finding of lung field: Secondary | ICD-10-CM | POA: Diagnosis not present

## 2019-10-14 DIAGNOSIS — J3801 Paralysis of vocal cords and larynx, unilateral: Secondary | ICD-10-CM | POA: Insufficient documentation

## 2019-10-14 LAB — GLUCOSE, CAPILLARY: Glucose-Capillary: 133 mg/dL — ABNORMAL HIGH (ref 70–99)

## 2019-10-14 MED ORDER — FLUDEOXYGLUCOSE F - 18 (FDG) INJECTION
10.3700 | Freq: Once | INTRAVENOUS | Status: AC | PRN
  Administered 2019-10-14: 10.37 via INTRAVENOUS

## 2019-10-18 ENCOUNTER — Encounter (HOSPITAL_COMMUNITY): Payer: Self-pay

## 2019-10-18 NOTE — Progress Notes (Signed)
I attempted to place an introductory phone call to this patient. Unable to reach patient at this time. I left a message with my contact information and explained that I will plan to meet with the patient during his initial visit with Dr. Delton Coombes.

## 2019-10-19 ENCOUNTER — Other Ambulatory Visit: Payer: Self-pay

## 2019-10-19 ENCOUNTER — Encounter (HOSPITAL_COMMUNITY): Payer: Self-pay | Admitting: Hematology

## 2019-10-19 ENCOUNTER — Inpatient Hospital Stay (HOSPITAL_COMMUNITY): Payer: Medicare Other | Attending: Hematology | Admitting: Hematology

## 2019-10-19 VITALS — BP 184/69 | HR 75 | Temp 97.3°F | Resp 17 | Ht 63.0 in | Wt 181.2 lb

## 2019-10-19 DIAGNOSIS — F419 Anxiety disorder, unspecified: Secondary | ICD-10-CM | POA: Diagnosis not present

## 2019-10-19 DIAGNOSIS — C3412 Malignant neoplasm of upper lobe, left bronchus or lung: Secondary | ICD-10-CM | POA: Diagnosis present

## 2019-10-19 DIAGNOSIS — I4891 Unspecified atrial fibrillation: Secondary | ICD-10-CM | POA: Diagnosis not present

## 2019-10-19 DIAGNOSIS — R918 Other nonspecific abnormal finding of lung field: Secondary | ICD-10-CM | POA: Insufficient documentation

## 2019-10-19 DIAGNOSIS — Z7901 Long term (current) use of anticoagulants: Secondary | ICD-10-CM | POA: Diagnosis not present

## 2019-10-19 DIAGNOSIS — C3490 Malignant neoplasm of unspecified part of unspecified bronchus or lung: Secondary | ICD-10-CM

## 2019-10-19 MED ORDER — HYDROCODONE-HOMATROPINE 5-1.5 MG/5ML PO SYRP: 15.0000 mL | ORAL_SOLUTION | Freq: Three times a day (TID) | ORAL | 0 refills | Status: DC | PRN

## 2019-10-19 NOTE — Patient Instructions (Signed)
[place] at [facility] Discharge Instructions  You were seen and examined today by Dr. Delton Coombes. Dr. Delton Coombes is a medical oncologist, meaning he specializes in medical management of cancer. You were referred to the cancer center due to suspicious findings on your recent CT scan during an emergency department visit. You recently had a PET scan. The PET scan revealed cancer present in the left lung and local lymph nodes. 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  Dr. Delton Coombes has recommended an MRI of your brain and a biopsy. This will identify the type of cancer you have and if it is present in your brain. This will confirm staging and determine treatment regimen. Treatment will most likely be chemotherapy and radiation.  We will refer you to a general surgeon to discuss Port-A-Cath placement. A Port-A-Cath is the safest way to administer chemotherapy.  You will return to the clinic following the biopsy and MRI.  Thank you for choosing [place] at [facility] to provide your oncology and hematology care.  To afford each patient quality time with our provider, please arrive at least 15 minutes before your scheduled appointment time.   If you have a lab appointment with the [facility] please come in thru the Main Entrance and check in at the main information desk.  You need to re-schedule your appointment should you arrive 10 or more minutes late.  We strive to give you quality time with our providers, and arriving late affects you and other patients whose appointments are after yours.  Also, if you no show three or more times for appointments you may be dismissed from the clinic at the providers discretion.     Again, thank you for choosing [facility].  Our hope is that these requests will decrease the amount of time that you wait before being seen by our physicians.        _____________________________________________________________  Should you have questions after your visit to [facility], please contact our office at [phone] and follow the prompts.  Our office hours are 8:00 a.m. and 4:30 p.m. Monday - Friday.  Please note that voicemails left after 4:00 p.m. may not be returned until the following business day.  We are closed weekends and major holidays.  You do have access to a nurse 24-7, just call the main number to the clinic [phone] and do not press any options, hold on the line and a nurse will answer the phone.    For prescription refill requests, have your pharmacy contact our office and allow 72 hours.    Due to Covid, you will need to wear a mask upon entering the hospital. If you do not have a mask, a mask will be given to you at the Main Entrance upon arrival. For doctor visits, patients may have 1 support person age 70 or older with them. For treatment visits, patients can not have anyone with them due to social distancing guidelines and our immunocompromised population.

## 2019-10-19 NOTE — Progress Notes (Signed)
[name], [place] [address]   CLINIC:  Medical Oncology/Hematology  CONSULT NOTE  Patient Care Team: Patient, No Pcp Per as PCP - General (General Practice)  CHIEF COMPLAINTS/PURPOSE OF CONSULTATION:  Evaluation of left lung mass  HISTORY OF PRESENTING ILLNESS:  [name] 69 y.o. male is here because of evaluation of left lung mass, at the request of Dr. Dorie Rank of [place]. He went to [place] on 10/06/2019 after having a persistent cough lasting almost 1 year, increasing fatigue, and lightheadedness from the coughing spells.  Today he is accompanied by his wife. He reports that his voice hoarseness started 1 week ago. His cough has been worsening and productive with white sputum; he gets dizzy when he has a coughing spell and intermittent chest pain with heavy coughing. His coughing gets worse when he reclines. He tried over the counter cough suppressant but it did not help. His appetite is decreased from his usual and he has lost about 10 lbs in the past 6 months. He does not have any metal in his body. He denies having numbness or tingling nor any pain in bones or muscles.  He quit smoking 5 years ago after smoking 1 PPD for 45 years. 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 He denies history of cancers in his family. He is able to do all of his ADL's at home.   MEDICAL HISTORY:  Past Medical History:  Diagnosis Date   Arthritis    Hyperlipemia    Hypertension    Hyperthyroidism    Myocardial infarction (HCC)    Paroxysmal atrial fibrillation (HCC)    Tachycardia     SURGICAL HISTORY: Past Surgical History:  Procedure Laterality Date   CARDIAC CATHETERIZATION      SOCIAL HISTORY: Social History   Socioeconomic History   Marital status: Married    Spouse name: Not on file   Number of children: 1   Years of education: Not on file   Highest education level: Not on file    Occupational History   Occupation: retired   Occupation: Glass blower/designer: [employer]  Tobacco Use   Smoking status: Former Smoker    Years: 45.00    Types: Cigars, Cigarettes    Quit date: 10/19/2014    Years since quitting: 5.0   Smokeless tobacco: Never Used  Vaping Use   Vaping Use: Never used  Substance and Sexual Activity   Alcohol use: Never   Drug use: No   Sexual activity: Not on file  Other Topics Concern   Not on file  Social History Narrative   Not on file   Social Determinants of Health   Financial Resource Strain: Medium Risk   Difficulty of Paying Living Expenses: Somewhat hard  Food Insecurity: No Food Insecurity   Worried About Charity fundraiser in the Last Year: Never true   [place] in the Last Year: Never true  Transportation Needs: No Transportation Needs   Lack of Transportation (Medical): No   Lack of Transportation (Non-Medical): No  Physical Activity: Inactive   Days of Exercise per Week: 0 days   Minutes of Exercise per Session: 0 min  Stress: Stress Concern Present   Feeling of Stress : Very much  Social Connections: Moderately Isolated   Frequency of Communication with Friends and Family: More than three times a week   Frequency of Social  Gatherings with Friends and Family: More than three times a week   Attends Religious Services: Never   Marine scientist or Organizations: No   Attends Music therapist: Never   Marital Status: Married  Human resources officer Violence: Not At Risk   Fear of Current or Ex-Partner: No   Emotionally Abused: No   Physically Abused: No   Sexually Abused: No    FAMILY HISTORY: Family History  Problem Relation Age of Onset   Hypertension Father    Dementia Father        died age 56   Heart disease Father    Heart failure Other     ALLERGIES:  has No Known Allergies.  MEDICATIONS:  No current outpatient medications on file.   No current  facility-administered medications for this visit.    REVIEW OF SYSTEMS:   Review of Systems  Constitutional: Positive for appetite change (moderately decreased), fatigue (moderate) and unexpected weight change (10 lbs in 6 mos).  HENT:   Positive for hearing loss and trouble swallowing.   Respiratory: Positive for cough (productive w/ white sputum) and shortness of breath.   Cardiovascular: Positive for chest pain (intermittent w/ coughing spells).  Musculoskeletal: Negative for arthralgias and myalgias.  Neurological: Negative for numbness.  All other systems reviewed and are negative.    PHYSICAL EXAMINATION: ECOG PERFORMANCE STATUS: 1 - Symptomatic but completely ambulatory  Vitals:   10/19/19 1329  BP: (!) 184/69  Pulse: 75  Resp: 17  Temp: (!) 97.3 F (36.3 C)  SpO2: 97%   Filed Weights   10/19/19 1329  Weight: 181 lb 3.2 oz (82.2 kg)   Physical Exam Vitals reviewed.  Constitutional:      Appearance: Normal appearance. He is obese.  Cardiovascular:     Rate and Rhythm: Normal rate and regular rhythm.     Pulses: Normal pulses.     Heart sounds: Normal heart sounds.  Pulmonary:     Effort: Pulmonary effort is normal.     Breath sounds: Examination of the left-upper field reveals decreased breath sounds. Decreased breath sounds present.  Abdominal:     Palpations: Abdomen is soft. There is no mass.     Tenderness: There is no abdominal tenderness.  Musculoskeletal:     Right lower leg: No edema.     Left lower leg: No edema.  Lymphadenopathy:     Cervical: No cervical adenopathy.     Upper Body:     Right upper body: No supraclavicular or axillary adenopathy.     Left upper body: No supraclavicular or axillary adenopathy.  Neurological:     General: No focal deficit present.     Mental Status: He is alert and oriented to person, place, and time.  Psychiatric:        Mood and Affect: Mood normal.        Behavior: Behavior normal.      LABORATORY DATA:    I have reviewed the data as listed CBC Latest Ref Rng & Units 10/06/2019 03/22/2012 12/25/2010  WBC 4.0 - 10.5 K/uL 12.2(H) 11.3(H) 8.4  Hemoglobin 13.0 - 17.0 g/dL 15.6 15.0 14.1  Hematocrit 39 - 52 % 48.6 43.9 41.5  Platelets 150 - 400 K/uL 188 167 157   CMP Latest Ref Rng & Units 10/06/2019 03/22/2012 12/25/2010  Glucose 70 - 99 mg/dL 111(H) 118(H) 122(H)  BUN 8 - 23 mg/dL 18 32(H) 20  Creatinine 0.61 - 1.24 mg/dL 1.09 1.55(H) 0.82  Sodium 135 -  145 mmol/L 139 137 137  Potassium 3.5 - 5.1 mmol/L 4.1 4.1 3.9  Chloride 98 - 111 mmol/L 105 104 106  CO2 22 - 32 mmol/L 25 24 24   Calcium 8.9 - 10.3 mg/dL 9.2 9.7 9.3  Total Protein 6.5 - 8.1 g/dL 8.3(H) 7.5 7.1  Total Bilirubin 0.3 - 1.2 mg/dL 0.8 0.4 0.2(L)  Alkaline Phos 38 - 126 U/L 92 66 105  AST 15 - 41 U/L 22 18 18   ALT 0 - 44 U/L 22 20 26     RADIOGRAPHIC STUDIES: I have personally reviewed the radiological images as listed and agreed with the findings in the report. DG Chest 2 View  Result Date: 10/06/2019 CLINICAL DATA:  Persistent cough. Additional provided: Persistent cough for 3 weeks, former smoker, atrial fibrillation, hypertension. EXAM: CHEST - 2 VIEW COMPARISON:  Prior chest radiographs 03/22/2012. FINDINGS: Heart size within normal limits. There is collapse of the left upper lobe. Superimposed pneumonia is difficult to exclude. The right lung is clear. No evidence of pleural effusion or pneumothorax. No acute bony abnormality is identified. Thoracic spondylosis. These results were called by telephone at the time of interpretation on 10/06/2019 at 5:53 pm to provider [facility] , who verbally acknowledged these results. IMPRESSION: Collapse of the left upper lobe. Contrast-enhanced chest CT is recommended for further evaluation, particularly to exclude a centrally obstructing mass. Superimposed left upper lobe pneumonia cannot be excluded. Electronically Signed   By: Kellie Simmering DO   On: 10/06/2019 17:54   CT Chest W  Contrast  Result Date: 10/06/2019 CLINICAL DATA:  Abnormal chest radiograph. EXAM: CT CHEST WITH CONTRAST TECHNIQUE: Multidetector CT imaging of the chest was performed during intravenous contrast administration. CONTRAST:  17mL OMNIPAQUE IOHEXOL 300 MG/ML  SOLN COMPARISON:  Chest radiograph same day FINDINGS: Cardiovascular: There is narrowing of the LEFT pulmonary artery by a LEFT suprahilar mass. Coronary artery calcification and aortic atherosclerotic calcification. Mediastinum/Nodes: No axillary or supraclavicular adenopathy. Large nodule of the LEFT lobe of the thyroid gland measures 2 cm. AP window nodal mass measures 3.4 cm. No supraclavicular adenopathy. Subcarinal enlarged lymph node measures 1.4 cm. Lungs/Pleura: There is a large LEFT suprahilar mass which completely obstructs the LEFT upper lobe bronchus. Mass extends into the LEFT upper lobe to the apex. Size of mass is difficult to ascertain but measures approximately 7.0 by 5.3 5.8 cm. Mass extends into the AP window and narrows the LEFT mainstem bronchus. Small subpleural nodule in the LEFT lower lobe measures 4 mm is indeterminate. Upper Abdomen: Adrenal glands normal.  No hepatic lesion identified. Musculoskeletal: No aggressive osseous lesion. Multilevel osteophytosis of thoracic spine IMPRESSION: 1. Large LEFT suprahilar mass which completely obstructing the LEFT upper lobe bronchus and extends into the LEFT upper lobe to the apex. Findings consistent with bronchogenic carcinoma. 2. Direct invasion into the the mediastinum through the AP window with e narrowing of the LEFT mainstem bronchus and LEFT pulmonary artery. 3. Subcarinal nodal metastasis. Electronically Signed   By: Suzy Bouchard M.D.   On: 10/06/2019 20:18   NM PET Image Initial (PI) Skull Base To Thigh  Result Date: 10/14/2019 CLINICAL DATA:  Initial treatment strategy for lung mass. EXAM: NUCLEAR MEDICINE PET SKULL BASE TO THIGH TECHNIQUE: 10.4 mCi F-18 FDG was injected  intravenously. Full-ring PET imaging was performed from the skull base to thigh after the radiotracer. CT data was obtained and used for attenuation correction and anatomic localization. Fasting blood glucose: 130 mg/dl COMPARISON:  Chest CT 10/06/2019 FINDINGS: Mediastinal  blood pool activity: SUV max 2.8 Liver activity: SUV max NA NECK: No hypermetabolic lymph nodes in the neck. Absent metabolic activity of the LEFT focal cord consistent with paralysis (related to recurrent laryngeal nerve impingement at the AP window by tumor) Incidental CT findings: none CHEST: Large LEFT suprahilar mass measuring approximately 4.5 x 5.7 cm intensely hypermetabolic SUV max equal 80.9. Lesion measures 7 cm in greatest dimension. Lesion does not appear to invade the chest wall. There is direct extension of hypermetabolic tissue into the AP window with equal metabolic activity. Hypermetabolic subcarinal lymph node with SUV max equal 13.6. No hypermetabolic supraclavicular nodes. No discrete pulmonary nodules. Incidental CT findings: none ABDOMEN/PELVIS: No abnormal hypermetabolic activity within the liver, pancreas, adrenal glands, or spleen. No hypermetabolic lymph nodes in the abdomen or pelvis. Incidental CT findings: none SKELETON: No focal hypermetabolic activity to suggest skeletal metastasis. Incidental CT findings: none IMPRESSION: 1. Large hypermetabolic LEFT upper lobe mass surrounding the LEFT hilum consistent bronchogenic carcinoma. Lesion extends to the pleural surface but does not invade the chest wall. 2. Direct invasion into the AP window and metastatic subcarinal lymph node. (PET/CT staging: T4 N2 M0 ) 3. No evidence of distant metastatic disease. 4. 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 Electronically Signed   By: Suzy Bouchard M.D.   On: 10/14/2019 14:38    ASSESSMENT:  1.  Left lung cancer: -Presentation to the ER on 10/06/2019 with cough and  dizziness. -CT chest with contrast on 10/06/2019 showed large left suprahilar mass completely obstructing left upper lobe bronchus, measuring 7 x 5.3 x 5.8 cm.  Mass extends into AP window and narrows the left mainstem bronchus.  Small subpleural nodule in the left lower lobe measuring 4 mm indeterminate.  AP window nodal mass measuring 3.4 cm.  No supraclavicular adenopathy.  Subcarinal enlarged lymph node measures 1.4 cm. -PET scan on 10/14/2019 shows large hypermetabolic left upper lobe mass surrounding the left hilum, extending into pleural surface not invading the chest wall.  Direct invasion into the AP window and metastatic subcarinal lymph node, T4 N2 M0.  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 -10 pound weight loss in the last 6 months.  Hoarseness for last 1 week.  2.  Social/family history: -He smoked 1 pack/day for 45 years, quit 5 years ago. -No family history of malignancies.   PLAN:  1.  Left lung cancer: -I have reviewed images of the PET scan with the patient and his wife in detail. -I have recommended biopsy for tissue diagnosis. -We will make a referral to pulmonary for bronchoscopy and biopsy. -Also recommended MRI of the brain with and without contrast. -I have also recommended port placement for chemotherapy administration. -Plan to see him back after the biopsy.  2.  Coughing spells: -He does get coughing spells resulting in dizziness occasionally.  He also developed hoarseness for the last 1 week.  He reports worsening of the cough in the last 3 weeks. -He has used over-the-counter cough syrup which did not help. -I will start him on Hycodan.   All questions were answered. The patient knows to call the clinic with any problems, questions or concerns.   Derek Jack, MD, 10/19/19 2:15 PM  [place] [phone]   I, Milinda Antis, am acting as a scribe for Dr. Sanda Linger.  IDerek Jack MD, have reviewed the above documentation  for accuracy and completeness, and I agree with the above.

## 2019-10-20 ENCOUNTER — Encounter (HOSPITAL_COMMUNITY): Payer: Self-pay

## 2019-10-20 ENCOUNTER — Encounter (HOSPITAL_COMMUNITY): Payer: Self-pay | Admitting: Radiology

## 2019-10-20 ENCOUNTER — Other Ambulatory Visit (HOSPITAL_COMMUNITY): Payer: Self-pay | Admitting: *Deleted

## 2019-10-20 MED ORDER — HYDROCODONE-HOMATROPINE 5-1.5 MG/5ML PO SYRP
15.0000 mL | ORAL_SOLUTION | Freq: Three times a day (TID) | ORAL | 0 refills | Status: DC | PRN
Start: 2019-10-20 — End: 2019-11-08

## 2019-10-20 NOTE — Progress Notes (Signed)
Unable to reach patient to inform of Pulmonary referral.

## 2019-10-20 NOTE — Progress Notes (Signed)
Alan Ochs "[name]" Male, 69 y.o., 1950-06-15 MRN:  [id number] Phone:  [phone] (H) ... PCP:  Patient, No Pcp Per Coverage:  [place] Medicare Next Appt With Radiology ([facility]-MR 1) 10/28/2019 at 10:00 AM  RE: CT Biopsy Received: Galen Daft, MD  Garth Bigness D Hold. I'm going to message Dr. Raliegh Ip to send case to Pulmonary.   GY       Previous Messages   ----- Message -----  From: Garth Bigness D  Sent: 10/19/2019  2:38 PM EDT  To: Ir Procedure Requests  Subject: CT Biopsy                     Procedure:  CT Biopsy   Reason:  Lung mass, Malignant neoplasm of unspecified part of unspecified bronchus or lung, L Lung nodule   History: CT, NM PET in computer   Provider: Derek Jack   Provider Contact: [phone]

## 2019-10-20 NOTE — Progress Notes (Signed)
Alan [name] "[place]" Male, 69 y.o., 1950/12/18 MRN:  [id number] Phone:  [phone] (H) ... PCP:  Patient, No Pcp Per Coverage:  [place] [employer] Medicare/[employer] Medicare Next Appt With Radiology ([facility] 1) 10/28/2019 at 10:00 AM  RE: CT Biopsy Received: Today Sandi Mariscal, MD  Garth Bigness D; P Ir Procedure Requests OK for CT guided L upper lobe mass Bx; Please schedule at [facility].   PET CT - image 25, series 8; Target the more superior hypermetabolic component if able.   Spoke with Alan [name] who doesn't feel the pt is a good candidate for bronch and wants [name] to attempt the percutaneous Bx to ensure enough tissue is acquired.   Alan [name]       Previous Messages   ----- Message -----  From: Garth Bigness D  Sent: 10/19/2019  2:38 PM EDT  To: Ir Procedure Requests  Subject: CT Biopsy                     Procedure:  CT Biopsy   Reason:  Lung mass, Malignant neoplasm of unspecified part of unspecified bronchus or lung, L Lung nodule   History: CT, NM PET in computer   Provider: Derek Jack   Provider Contact: [phone]

## 2019-10-20 NOTE — Progress Notes (Signed)
I spoke with the patient's wife today. I informed her the pending pulmonology referral for biopsy. Wife verbalized understanding.

## 2019-10-25 ENCOUNTER — Encounter: Payer: Self-pay | Admitting: General Surgery

## 2019-10-25 ENCOUNTER — Ambulatory Visit (INDEPENDENT_AMBULATORY_CARE_PROVIDER_SITE_OTHER): Payer: Medicare Other | Admitting: General Surgery

## 2019-10-25 ENCOUNTER — Other Ambulatory Visit: Payer: Self-pay

## 2019-10-25 VITALS — BP 188/68 | HR 56 | Temp 98.7°F | Resp 16 | Ht 63.0 in | Wt 181.0 lb

## 2019-10-25 DIAGNOSIS — C3402 Malignant neoplasm of left main bronchus: Secondary | ICD-10-CM | POA: Diagnosis not present

## 2019-10-25 NOTE — Progress Notes (Signed)
Alan [name]; [id number]; 1950/03/09   HPI Patient is a 69 year old white male who was referred to my care by Dr. Delton Coombes for Port-A-Cath insertion.  He is a newly diagnosed left lung cancer and needs central venous access for chemotherapy. Past Medical History:  Diagnosis Date  . Arthritis   . Hyperlipemia   . Hypertension   . Hyperthyroidism   . Myocardial infarction ([place])   . Paroxysmal atrial fibrillation (HCC)   . Tachycardia     Past Surgical History:  Procedure Laterality Date  . CARDIAC CATHETERIZATION      Family History  Problem Relation Age of Onset  . Hypertension Father   . Dementia Father        died age 2  . Heart disease Father   . Heart failure Other     Current Outpatient Medications on File Prior to Visit  Medication Sig Dispense Refill  . HYDROcodone-homatropine (HYCODAN) 5-1.5 MG/5ML syrup Take 15 mLs by mouth every 8 (eight) hours as needed for cough. 480 mL 0   No current facility-administered medications on file prior to visit.    No Known Allergies  Social History   Substance and Sexual Activity  Alcohol Use Never    Social History   Tobacco Use  Smoking Status Former Smoker  . Years: 45.00  . Types: Cigars, Cigarettes  . Quit date: 10/19/2014  . Years since quitting: 5.0  Smokeless Tobacco Never Used    Review of Systems  Constitutional: Negative.   HENT: Positive for sore throat.   Eyes: Negative.   Respiratory: Positive for cough, shortness of breath and wheezing.   Cardiovascular: Negative.   Gastrointestinal: Negative.   Genitourinary: Negative.   Musculoskeletal: Negative.   Skin: Negative.   Neurological: Negative.   Endo/Heme/Allergies: Negative.   Psychiatric/Behavioral: Negative.     Objective   Vitals:   10/25/19 1419  BP: (!) 188/68  Pulse: (!) 56  Resp: 16  Temp: 98.7 F (37.1 C)  SpO2: 93%    Physical Exam Vitals reviewed.  Constitutional:      Appearance: Normal appearance. He is not  ill-appearing.  HENT:     Head: Normocephalic and atraumatic.  Cardiovascular:     Rate and Rhythm: Normal rate and regular rhythm.     Heart sounds: Normal heart sounds. No murmur heard.  No friction rub. No gallop.   Pulmonary:     Effort: Pulmonary effort is normal. No respiratory distress.     Breath sounds: No stridor. No wheezing, rhonchi or rales.  Skin:    General: Skin is warm and dry.  Neurological:     Mental Status: He is alert and oriented to person, place, and time.    Dr. Tomie China notes reviewed Assessment  Left lung mass, presumably cancer, need for central venous access Plan   Patient is scheduled for a Port-A-Cath insertion on 11/01/2019.  The risks and benefits of the procedure including bleeding, infection, and pneumothorax were fully explained to the patient, who gave informed consent.

## 2019-10-25 NOTE — H&P (Signed)
Alan [name]; [id number]; [birth year]   HPI Patient is a 69 year old white male who was referred to my care by Dr. Delton Coombes for Port-A-Cath insertion.  He is a newly diagnosed left lung cancer and needs central venous access for chemotherapy. Past Medical History:  Diagnosis Date  . Arthritis   . Hyperlipemia   . Hypertension   . Hyperthyroidism   . Myocardial infarction ([place])   . Paroxysmal atrial fibrillation (HCC)   . Tachycardia     Past Surgical History:  Procedure Laterality Date  . CARDIAC CATHETERIZATION      Family History  Problem Relation Age of Onset  . Hypertension Father   . Dementia Father        died age 29  . Heart disease Father   . Heart failure Other     Current Outpatient Medications on File Prior to Visit  Medication Sig Dispense Refill  . HYDROcodone-homatropine (HYCODAN) 5-1.5 MG/5ML syrup Take 15 mLs by mouth every 8 (eight) hours as needed for cough. 480 mL 0   No current facility-administered medications on file prior to visit.    No Known Allergies  Social History   Substance and Sexual Activity  Alcohol Use Never    Social History   Tobacco Use  Smoking Status Former Smoker  . Years: 45.00  . Types: Cigars, Cigarettes  . Quit date: 10/19/2014  . Years since quitting: 5.0  Smokeless Tobacco Never Used    Review of Systems  Constitutional: Negative.   HENT: Positive for sore throat.   Eyes: Negative.   Respiratory: Positive for cough, shortness of breath and wheezing.   Cardiovascular: Negative.   Gastrointestinal: Negative.   Genitourinary: Negative.   Musculoskeletal: Negative.   Skin: Negative.   Neurological: Negative.   Endo/Heme/Allergies: Negative.   Psychiatric/Behavioral: Negative.     Objective   Vitals:   10/25/19 1419  BP: (!) 188/68  Pulse: (!) 56  Resp: 16  Temp: 98.7 F (37.1 C)  SpO2: 93%    Physical Exam Vitals reviewed.  Constitutional:      Appearance: Normal appearance. He is not  ill-appearing.  HENT:     Head: Normocephalic and atraumatic.  Cardiovascular:     Rate and Rhythm: Normal rate and regular rhythm.     Heart sounds: Normal heart sounds. No murmur heard.  No friction rub. No gallop.   Pulmonary:     Effort: Pulmonary effort is normal. No respiratory distress.     Breath sounds: No stridor. No wheezing, rhonchi or rales.  Skin:    General: Skin is warm and dry.  Neurological:     Mental Status: He is alert and oriented to person, place, and time.    Dr. Tomie China notes reviewed Assessment  Left lung mass, presumably cancer, need for central venous access Plan   Patient is scheduled for a Port-A-Cath insertion on 11/01/2019.  The risks and benefits of the procedure including bleeding, infection, and pneumothorax were fully explained to the patient, who gave informed consent.

## 2019-10-25 NOTE — Patient Instructions (Signed)

## 2019-10-26 ENCOUNTER — Emergency Department (HOSPITAL_COMMUNITY): Payer: Medicare Other

## 2019-10-26 ENCOUNTER — Encounter (HOSPITAL_COMMUNITY): Payer: Self-pay | Admitting: *Deleted

## 2019-10-26 ENCOUNTER — Other Ambulatory Visit: Payer: Self-pay

## 2019-10-26 DIAGNOSIS — R0602 Shortness of breath: Secondary | ICD-10-CM | POA: Insufficient documentation

## 2019-10-26 DIAGNOSIS — Z87891 Personal history of nicotine dependence: Secondary | ICD-10-CM | POA: Diagnosis not present

## 2019-10-26 DIAGNOSIS — R05 Cough: Secondary | ICD-10-CM | POA: Insufficient documentation

## 2019-10-26 DIAGNOSIS — I10 Essential (primary) hypertension: Secondary | ICD-10-CM | POA: Diagnosis not present

## 2019-10-26 DIAGNOSIS — C3492 Malignant neoplasm of unspecified part of left bronchus or lung: Secondary | ICD-10-CM | POA: Insufficient documentation

## 2019-10-26 DIAGNOSIS — J189 Pneumonia, unspecified organism: Secondary | ICD-10-CM | POA: Diagnosis not present

## 2019-10-26 NOTE — ED Triage Notes (Signed)
Pt with left lung cancer. Pt with SOB, states he was unable to sleep last night. Denies any pain.

## 2019-10-27 ENCOUNTER — Emergency Department (HOSPITAL_COMMUNITY)
Admission: EM | Admit: 2019-10-27 | Discharge: 2019-10-27 | Disposition: A | Discharge status: Home / Self Care | Payer: Medicare Other | Attending: Emergency Medicine | Admitting: Emergency Medicine

## 2019-10-27 DIAGNOSIS — C3492 Malignant neoplasm of unspecified part of left bronchus or lung: Secondary | ICD-10-CM

## 2019-10-27 DIAGNOSIS — R0602 Shortness of breath: Secondary | ICD-10-CM | POA: Diagnosis not present

## 2019-10-27 DIAGNOSIS — J189 Pneumonia, unspecified organism: Secondary | ICD-10-CM

## 2019-10-27 MED ORDER — ALBUTEROL SULFATE (2.5 MG/3ML) 0.083% IN NEBU: 2.5000 mg | INHALATION_SOLUTION | Freq: Four times a day (QID) | RESPIRATORY_TRACT | 12 refills | Status: DC | PRN

## 2019-10-27 MED ORDER — AMOXICILLIN-POT CLAVULANATE 875-125 MG PO TABS: 1.0000 | ORAL_TABLET | Freq: Two times a day (BID) | ORAL | 0 refills | Status: DC

## 2019-10-27 MED ORDER — AMOXICILLIN-POT CLAVULANATE 875-125 MG PO TABS
1.0000 | ORAL_TABLET | Freq: Once | ORAL | Status: AC
  Administered 2019-10-27: 1 via ORAL
  Filled 2019-10-27: qty 1

## 2019-10-27 NOTE — ED Provider Notes (Signed)
[facility] EMERGENCY DEPARTMENT Provider Note   CSN: [id number] Arrival date & time: 10/26/19  2200     History Chief Complaint  Patient presents with  . Shortness of Breath    Alan [name] is a 69 y.o. male.  Patient presents to the emergency department for evaluation of shortness of breath. Patient reports that he was recently diagnosed with stage III lung cancer and is currently completing work-up prior to initiating chemotherapy. He has noticed increased shortness of breath recently, worse if he lies down. He was unable to sleep tonight because of his difficulty breathing. He reports if he sits up he feels better if he walks around he is able to breathe without much difficulty. He continues to have cough and chest congestion, bringing up thick sputum. No hemoptysis.        Past Medical History:  Diagnosis Date  . Arthritis   . Hyperlipemia   . Hypertension   . Hyperthyroidism   . Myocardial infarction ([place])   . Paroxysmal atrial fibrillation (H[facility])   . Tachycardia     Patient Active Problem List   Diagnosis Date Noted  . Mass of upper lobe of left lung 10/19/2019  . Atrial fibrillation with rapid ventricular response ([place]) 12/25/2010  . Hyperlipidemia 12/25/2010  . Hyperglycemia 12/25/2010  . Noncompliance with medications 12/25/2010  . Tobacco abuse 12/25/2010    Past Surgical History:  Procedure Laterality Date  . CARDIAC CATHETERIZATION         Family History  Problem Relation Age of Onset  . Hypertension Father   . Dementia Father        died age 47  . Heart disease Father   . Heart failure Other     Social History   Tobacco Use  . Smoking status: Former Smoker    Years: 45.00    Types: 34, Cigarettes    Quit date: 10/19/2014    Years since quitting: 5.0  . Smokeless tobacco: Never Used  Vaping Use  . Vaping Use: Never used  Substance Use Topics  . Alcohol use: Never  . Drug use: No    Home Medications Prior to Admission  medications   Medication Sig Start Date End Date Taking? Authorizing Provider  acetaminophen (TYLENOL) 500 MG tablet Take 500 mg by mouth every 6 (six) hours as needed for moderate pain or headache.    [provider]  albuterol (PROVENTIL) (2.5 MG/3ML) 0.083% nebulizer solution Take 3 mLs (2.5 mg total) by nebulization every 6 (six) hours as needed for wheezing or shortness of breath. 10/27/19   Orpah Greek, MD  amoxicillin-clavulanate (AUGMENTIN) 875-125 MG tablet Take 1 tablet by mouth 2 (two) times daily. 10/27/19   Duc Crocket, Gwenyth Allegra, MD  HYDROcodone-homatropine (HYCODAN) 5-1.5 MG/5ML syrup Take 15 mLs by mouth every 8 (eight) hours as needed for cough. 10/20/19   Lockamy, Randi L, NP-C  ibuprofen (ADVIL) 200 MG tablet Take 200-400 mg by mouth every 6 (six) hours as needed for headache or moderate pain.     [provider]    Allergies    Patient has no known allergies.  Review of Systems   Review of Systems  Respiratory: Positive for cough and shortness of breath.   All other systems reviewed and are negative.   Physical Exam Updated Vital Signs BP (!) 149/70 (BP Location: Right Arm)   Pulse 78   Temp 99.6 F (37.6 C) (Oral)   Resp 20   Ht 5\' 4"  (1.626 m)  Wt 83.5 kg   SpO2 99%   BMI 31.58 kg/m   Physical Exam Vitals and nursing note reviewed.  Constitutional:      General: He is not in acute distress.    Appearance: Normal appearance. He is well-developed.  HENT:     Head: Normocephalic and atraumatic.     Right Ear: Hearing normal.     Left Ear: Hearing normal.     Nose: Nose normal.  Eyes:     Conjunctiva/sclera: Conjunctivae normal.     Pupils: Pupils are equal, round, and reactive to light.  Cardiovascular:     Rate and Rhythm: Regular rhythm.     Heart sounds: S1 normal and S2 normal. No murmur heard.  No friction rub. No gallop.   Pulmonary:     Effort: Pulmonary effort is normal. No respiratory distress.     Breath sounds:  Normal breath sounds.  Chest:     Chest wall: No tenderness.  Abdominal:     General: Bowel sounds are normal.     Palpations: Abdomen is soft.     Tenderness: There is no abdominal tenderness. There is no guarding or rebound. Negative signs include Murphy's sign and McBurney's sign.     Hernia: No hernia is present.  Musculoskeletal:        General: Normal range of motion.     Cervical back: Normal range of motion and neck supple.  Skin:    General: Skin is warm and dry.     Findings: No rash.  Neurological:     Mental Status: He is alert and oriented to person, place, and time.     GCS: GCS eye subscore is 4. GCS verbal subscore is 5. GCS motor subscore is 6.     Cranial Nerves: No cranial nerve deficit.     Sensory: No sensory deficit.     Coordination: Coordination normal.  Psychiatric:        Speech: Speech normal.        Behavior: Behavior normal.        Thought Content: Thought content normal.     ED Results / Procedures / Treatments   Labs (all labs ordered are listed, but only abnormal results are displayed) Labs Reviewed - No data to display  EKG None  Radiology DG Chest Portable 1 View  Result Date: 10/27/2019 CLINICAL DATA:  Shortness of breath EXAM: PORTABLE CHEST 1 VIEW COMPARISON:  10/06/2019.  Chest CT 10/06/2019. FINDINGS: Diffuse airspace disease throughout the left lung, worsening since prior study. This may be related to postobstructive atelectasis or pneumonia related to the large left upper lobe mass seen on prior study. Right lung clear. Heart is normal size. No acute bony abnormality. IMPRESSION: Worsening aeration throughout the left lung, likely related to worsening postobstructive atelectasis or pneumonia. Electronically Signed   By: Rolm Baptise M.D.   On: 10/27/2019 00:09    Procedures Procedures (including critical care time)  Medications Ordered in ED Medications - No data to display  ED Course  I have reviewed the triage vital signs and  the nursing notes.  Pertinent labs & imaging results that were available during my care of the patient were reviewed by me and considered in my medical decision making (see chart for details).    MDM Rules/Calculators/A&P                          Patient presents to the emergency department for evaluation of  shortness of breath.  Patient was recently diagnosed with lung cancer and is plugged in for treatment.  He has a large central mass in the left lung and has a known collapse secondary to the mass.  Chest x-ray shows persistent collapse, possible postobstructive infection.  Patient's vital signs are unremarkable.  He is not hypoxic.  He appears well.  He is in no distress currently.  As he is already scheduled for further work-up of this I do not feel that he requires work-up at this time.  Will cover him for possible postobstructive pneumonia with Augmentin.  He has a nebulizer machine at home that he can use.  Will prescribe albuterol to be used as needed.  Follow-up with his oncologist as scheduled, return for worsening symptoms.  Final Clinical Impression(s) / ED Diagnoses Final diagnoses:  SOB (shortness of breath)  Malignant neoplasm of left lung, unspecified part of lung ([place])  Postobstructive pneumonia    Rx / DC Orders ED Discharge Orders         Ordered    amoxicillin-clavulanate (AUGMENTIN) 875-125 MG tablet  2 times daily        10/27/19 0348    albuterol (PROVENTIL) (2.5 MG/3ML) 0.083% nebulizer solution  Every 6 hours PRN        10/27/19 0348           Orpah Greek, MD 10/27/19 [phone]

## 2019-10-28 ENCOUNTER — Encounter (HOSPITAL_COMMUNITY)
Admission: RE | Admit: 2019-10-28 | Discharge: 2019-10-28 | Disposition: A | Discharge status: Home / Self Care | Payer: Medicare Other | Source: Ambulatory Visit | Attending: General Surgery | Admitting: General Surgery

## 2019-10-28 ENCOUNTER — Other Ambulatory Visit (HOSPITAL_COMMUNITY)
Admission: RE | Admit: 2019-10-28 | Discharge: 2019-10-28 | Disposition: A | Discharge status: Home / Self Care | Payer: Medicare Other | Source: Ambulatory Visit | Attending: General Surgery | Admitting: General Surgery

## 2019-10-28 ENCOUNTER — Other Ambulatory Visit: Payer: Self-pay

## 2019-10-28 ENCOUNTER — Ambulatory Visit (HOSPITAL_COMMUNITY)
Admission: RE | Admit: 2019-10-28 | Discharge: 2019-10-28 | Disposition: A | Discharge status: Home / Self Care | Payer: Medicare Other | Source: Ambulatory Visit | Attending: Hematology | Admitting: Hematology

## 2019-10-28 ENCOUNTER — Other Ambulatory Visit (HOSPITAL_COMMUNITY): Payer: Self-pay | Admitting: Nurse Practitioner

## 2019-10-28 ENCOUNTER — Other Ambulatory Visit: Payer: Self-pay | Admitting: Student

## 2019-10-28 DIAGNOSIS — Z20822 Contact with and (suspected) exposure to covid-19: Secondary | ICD-10-CM | POA: Insufficient documentation

## 2019-10-28 DIAGNOSIS — I6782 Cerebral ischemia: Secondary | ICD-10-CM | POA: Insufficient documentation

## 2019-10-28 DIAGNOSIS — G319 Degenerative disease of nervous system, unspecified: Secondary | ICD-10-CM | POA: Diagnosis not present

## 2019-10-28 DIAGNOSIS — C3490 Malignant neoplasm of unspecified part of unspecified bronchus or lung: Secondary | ICD-10-CM | POA: Insufficient documentation

## 2019-10-28 DIAGNOSIS — F419 Anxiety disorder, unspecified: Secondary | ICD-10-CM

## 2019-10-28 LAB — SARS CORONAVIRUS 2 (TAT 6-24 HRS): SARS Coronavirus 2: NEGATIVE

## 2019-10-28 MED ORDER — GADOBUTROL 1 MMOL/ML IV SOLN
8.0000 mL | Freq: Once | INTRAVENOUS | Status: AC | PRN
  Administered 2019-10-28: 8 mL via INTRAVENOUS

## 2019-10-28 MED ORDER — LORAZEPAM 1 MG PO TABS: 1.0000 mg | ORAL_TABLET | Freq: Once | ORAL | 0 refills | Status: AC

## 2019-10-31 ENCOUNTER — Ambulatory Visit (HOSPITAL_COMMUNITY): Admission: RE | Admit: 2019-10-31 | Payer: Medicare Other | Source: Ambulatory Visit

## 2019-10-31 ENCOUNTER — Encounter (HOSPITAL_COMMUNITY): Payer: Self-pay

## 2019-10-31 ENCOUNTER — Other Ambulatory Visit: Payer: Self-pay

## 2019-10-31 ENCOUNTER — Ambulatory Visit (HOSPITAL_COMMUNITY)
Admission: RE | Admit: 2019-10-31 | Discharge: 2019-10-31 | Disposition: A | Discharge status: Home / Self Care | Payer: Medicare Other | Source: Ambulatory Visit | Attending: Hematology | Admitting: Hematology

## 2019-10-31 ENCOUNTER — Ambulatory Visit (HOSPITAL_COMMUNITY)
Admission: RE | Admit: 2019-10-31 | Discharge: 2019-10-31 | Disposition: A | Discharge status: Home / Self Care | Payer: Medicare Other | Source: Ambulatory Visit | Attending: Interventional Radiology | Admitting: Interventional Radiology

## 2019-10-31 DIAGNOSIS — C3412 Malignant neoplasm of upper lobe, left bronchus or lung: Secondary | ICD-10-CM | POA: Diagnosis not present

## 2019-10-31 DIAGNOSIS — R918 Other nonspecific abnormal finding of lung field: Secondary | ICD-10-CM | POA: Diagnosis present

## 2019-10-31 DIAGNOSIS — C3490 Malignant neoplasm of unspecified part of unspecified bronchus or lung: Secondary | ICD-10-CM

## 2019-10-31 DIAGNOSIS — Z79899 Other long term (current) drug therapy: Secondary | ICD-10-CM | POA: Insufficient documentation

## 2019-10-31 DIAGNOSIS — E785 Hyperlipidemia, unspecified: Secondary | ICD-10-CM | POA: Insufficient documentation

## 2019-10-31 DIAGNOSIS — I10 Essential (primary) hypertension: Secondary | ICD-10-CM | POA: Diagnosis not present

## 2019-10-31 DIAGNOSIS — I252 Old myocardial infarction: Secondary | ICD-10-CM | POA: Diagnosis not present

## 2019-10-31 DIAGNOSIS — I480 Paroxysmal atrial fibrillation: Secondary | ICD-10-CM | POA: Insufficient documentation

## 2019-10-31 DIAGNOSIS — Z792 Long term (current) use of antibiotics: Secondary | ICD-10-CM | POA: Insufficient documentation

## 2019-10-31 DIAGNOSIS — Z87891 Personal history of nicotine dependence: Secondary | ICD-10-CM | POA: Insufficient documentation

## 2019-10-31 DIAGNOSIS — Z8249 Family history of ischemic heart disease and other diseases of the circulatory system: Secondary | ICD-10-CM | POA: Insufficient documentation

## 2019-10-31 DIAGNOSIS — J939 Pneumothorax, unspecified: Secondary | ICD-10-CM

## 2019-10-31 LAB — PROTIME-INR
INR: 1 (ref 0.8–1.2)
Prothrombin Time: 13.1 seconds (ref 11.4–15.2)

## 2019-10-31 LAB — CBC
HCT: 44.1 % (ref 39.0–52.0)
Hemoglobin: 14.1 g/dL (ref 13.0–17.0)
MCH: 27.4 pg (ref 26.0–34.0)
MCHC: 32 g/dL (ref 30.0–36.0)
MCV: 85.8 fL (ref 80.0–100.0)
Platelets: 193 10*3/uL (ref 150–400)
RBC: 5.14 MIL/uL (ref 4.22–5.81)
RDW: 13 % (ref 11.5–15.5)
WBC: 9.9 10*3/uL (ref 4.0–10.5)
nRBC: 0 % (ref 0.0–0.2)

## 2019-10-31 LAB — APTT: aPTT: 29 seconds (ref 24–36)

## 2019-10-31 MED ORDER — FENTANYL CITRATE (PF) 100 MCG/2ML IJ SOLN
INTRAMUSCULAR | Status: AC | PRN
  Administered 2019-10-31: 50 ug via INTRAVENOUS

## 2019-10-31 MED ORDER — FENTANYL CITRATE (PF) 100 MCG/2ML IJ SOLN
INTRAMUSCULAR | Status: AC
  Filled 2019-10-31: qty 2

## 2019-10-31 MED ORDER — MIDAZOLAM HCL 2 MG/2ML IJ SOLN
INTRAMUSCULAR | Status: AC | PRN
  Administered 2019-10-31: 1 mg via INTRAVENOUS

## 2019-10-31 MED ORDER — MIDAZOLAM HCL 2 MG/2ML IJ SOLN
INTRAMUSCULAR | Status: AC
  Filled 2019-10-31: qty 2

## 2019-10-31 MED ORDER — LIDOCAINE HCL 1 % IJ SOLN
INTRAMUSCULAR | Status: AC
  Filled 2019-10-31: qty 20

## 2019-10-31 MED ORDER — SODIUM CHLORIDE 0.9 % IV SOLN: INTRAVENOUS | Status: DC

## 2019-10-31 NOTE — H&P (Signed)
Chief Complaint: Patient was seen in consultation today for left lung mass biopsy at the request of [place]  Referring Physician(s): Katragadda,Sreedhar  Supervising Physician: Corrie Mckusick  Patient Status: [facility] - Out-pt  History of Present Illness: Alan [name] is a 69 y.o. male   Was seen in [facility] ED 10/06/19 for SOB and cough Persistent cough for nearly 1 yr Worsening and having coughing spells-- causing lightheadedness/dizziness Hoarseness Productive white sputum Wt loss Quit smoking 5 years ago  Dr Raliegh Ip note 10/19/19: 1.  Left lung cancer: -Presentation to the ER on 10/06/2019 with cough and dizziness. -CT chest with contrast on 10/06/2019 showed large left suprahilar mass completely obstructing left upper lobe bronchus, measuring 7 x 5.3 x 5.8 cm.  Mass extends into AP window and narrows the left mainstem bronchus.  Small subpleural nodule in the left lower lobe measuring 4 mm indeterminate.  AP window nodal mass measuring 3.4 cm.  No supraclavicular adenopathy.  Subcarinal enlarged lymph node measures 1.4 cm. -PET scan on 10/14/2019 shows large hypermetabolic left upper lobe mass surrounding the left hilum, extending into pleural surface not invading the chest wall.  Direct invasion into the AP window and metastatic subcarinal lymph node, T4 N2 M0.  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 -10 pound weight loss in the last 6 months.  Hoarseness for last 1 week. 2.  Social/family history: -He smoked 1 pack/day for 45 years, quit 5 years ago. -No family history of malignancies.  PLAN: 1.  Left lung cancer: -I have reviewed images of the PET scan with the patient and his wife in detail. -I have recommended biopsy for tissue diagnosis.  Scheduled now for left lung mass biopsy  Sandi Mariscal, MD  Garth Bigness D; P Ir Procedure Requests OK for CT guided L upper lobe mass Bx; Please schedule at [facility].  PET  CT - image 25, series 8; Target the more superior hypermetabolic component if able.  Spoke with Delton Coombes who doesn't feel the pt is a good candidate for bronch and wants [name] to attempt the percutaneous Bx to ensure enough tissue is acquired.    Past Medical History:  Diagnosis Date  . Arthritis   . Hyperlipemia   . Hypertension   . Hyperthyroidism   . Myocardial infarction ([place])   . Paroxysmal atrial fibrillation (H[facility])   . Tachycardia     Past Surgical History:  Procedure Laterality Date  . CARDIAC CATHETERIZATION      Allergies: Patient has no known allergies.  Medications: Prior to Admission medications   Medication Sig Start Date End Date Taking? Authorizing Provider  acetaminophen (TYLENOL) 500 MG tablet Take 500 mg by mouth every 6 (six) hours as needed for moderate pain or headache.   Yes [provider]  albuterol (PROVENTIL) (2.5 MG/3ML) 0.083% nebulizer solution Take 3 mLs (2.5 mg total) by nebulization every 6 (six) hours as needed for wheezing or shortness of breath. 10/27/19  Yes Pollina, Gwenyth Allegra, MD  amoxicillin-clavulanate (AUGMENTIN) 875-125 MG tablet Take 1 tablet by mouth 2 (two) times daily. 10/27/19  Yes Pollina, Gwenyth Allegra, MD  HYDROcodone-homatropine [facility]) 5-1.5 MG/5ML syrup Take 15 mLs by mouth every 8 (eight) hours as needed for cough. 10/20/19  Yes Lockamy, Randi L, NP-C  ibuprofen (ADVIL) 200 MG tablet Take 200-400 mg by mouth every 6 (six) hours as needed for headache or moderate pain.    Yes [provider]  LORazepam (ATIVAN) 1  MG tablet Take 1 mg by mouth once.   Yes [provider]     Family History  Problem Relation Age of Onset  . Hypertension Father   . Dementia Father        died age 62  . Heart disease Father   . Heart failure Other     Social History   Socioeconomic History  . Marital status: Married    Spouse name: Not on file  . Number of children: 1  . Years of education: Not on file  .  Highest education level: Not on file  Occupational History  . Occupation: retired  . Occupation: Glass blower/designer: [employer]  Tobacco Use  . Smoking status: Former Smoker    Years: 45.00    Types: 59, Cigarettes    Quit date: 10/19/2014    Years since quitting: 5.0  . Smokeless tobacco: Never Used  Vaping Use  . Vaping Use: Never used  Substance and Sexual Activity  . Alcohol use: Never  . Drug use: No  . Sexual activity: Not on file  Other Topics Concern  . Not on file  Social History Narrative  . Not on file   Social Determinants of Health   Financial Resource Strain: Medium Risk  . Difficulty of Paying Living Expenses: Somewhat hard  Food Insecurity: No Food Insecurity  . Worried About Charity fundraiser in the Last Year: Never true  . Ran Out of Food in the Last Year: Never true  Transportation Needs: No Transportation Needs  . Lack of Transportation (Medical): No  . Lack of Transportation (Non-Medical): No  Physical Activity: Inactive  . Days of Exercise per Week: 0 days  . Minutes of Exercise per Session: 0 min  Stress: Stress Concern Present  . Feeling of Stress : Very much  Social Connections: Moderately Isolated  . Frequency of Communication with Friends and Family: More than three times a week  . Frequency of Social Gatherings with Friends and Family: More than three times a week  . Attends Religious Services: Never  . Active Member of Clubs or Organizations: No  . Attends Archivist Meetings: Never  . Marital Status: Married     Review of Systems: A 12 point ROS discussed and pertinent positives are indicated in the HPI above.  All other systems are negative.  Review of Systems  Constitutional: Positive for activity change, appetite change, fatigue and unexpected weight change. Negative for fever.  Respiratory: Positive for cough, choking, shortness of breath and wheezing.   Cardiovascular: Positive for chest pain.  Neurological:  Positive for weakness.  Psychiatric/Behavioral: Negative for behavioral problems and confusion.    Vital Signs: BP (!) 162/72   Pulse (!) 59   Temp 98 F (36.7 C) (Oral)   Resp 18   Ht 5\' 5"  (1.651 m)   Wt 184 lb (83.5 kg)   SpO2 99%   BMI 30.62 kg/m   Physical Exam Vitals reviewed.  Cardiovascular:     Rate and Rhythm: Regular rhythm. Tachycardia present.  Pulmonary:     Effort: Pulmonary effort is normal.     Breath sounds: Wheezing present.  Abdominal:     Palpations: Abdomen is soft.  Musculoskeletal:        General: Normal range of motion.  Skin:    General: Skin is warm and dry.  Neurological:     Mental Status: He is alert and oriented to person, place, and time.  Psychiatric:  Behavior: Behavior normal.     Imaging: DG Chest 2 View  Result Date: 10/06/2019 CLINICAL DATA:  Persistent cough. Additional provided: Persistent cough for 3 weeks, former smoker, atrial fibrillation, hypertension. EXAM: CHEST - 2 VIEW COMPARISON:  Prior chest radiographs 03/22/2012. FINDINGS: Heart size within normal limits. There is collapse of the left upper lobe. Superimposed pneumonia is difficult to exclude. The right lung is clear. No evidence of pleural effusion or pneumothorax. No acute bony abnormality is identified. Thoracic spondylosis. These results were called by telephone at the time of interpretation on 10/06/2019 at 5:53 pm to provider [facility] , who verbally acknowledged these results. IMPRESSION: Collapse of the left upper lobe. Contrast-enhanced chest CT is recommended for further evaluation, particularly to exclude a centrally obstructing mass. Superimposed left upper lobe pneumonia cannot be excluded. Electronically Signed   By: Kellie Simmering DO   On: 10/06/2019 17:54   CT Chest W Contrast  Result Date: 10/06/2019 CLINICAL DATA:  Abnormal chest radiograph. EXAM: CT CHEST WITH CONTRAST TECHNIQUE: Multidetector CT imaging of the chest was performed during intravenous  contrast administration. CONTRAST:  10mL OMNIPAQUE IOHEXOL 300 MG/ML  SOLN COMPARISON:  Chest radiograph same day FINDINGS: Cardiovascular: There is narrowing of the LEFT pulmonary artery by a LEFT suprahilar mass. Coronary artery calcification and aortic atherosclerotic calcification. Mediastinum/Nodes: No axillary or supraclavicular adenopathy. Large nodule of the LEFT lobe of the thyroid gland measures 2 cm. AP window nodal mass measures 3.4 cm. No supraclavicular adenopathy. Subcarinal enlarged lymph node measures 1.4 cm. Lungs/Pleura: There is a large LEFT suprahilar mass which completely obstructs the LEFT upper lobe bronchus. Mass extends into the LEFT upper lobe to the apex. Size of mass is difficult to ascertain but measures approximately 7.0 by 5.3 5.8 cm. Mass extends into the AP window and narrows the LEFT mainstem bronchus. Small subpleural nodule in the LEFT lower lobe measures 4 mm is indeterminate. Upper Abdomen: Adrenal glands normal.  No hepatic lesion identified. Musculoskeletal: No aggressive osseous lesion. Multilevel osteophytosis of thoracic spine IMPRESSION: 1. Large LEFT suprahilar mass which completely obstructing the LEFT upper lobe bronchus and extends into the LEFT upper lobe to the apex. Findings consistent with bronchogenic carcinoma. 2. Direct invasion into the the mediastinum through the AP window with e narrowing of the LEFT mainstem bronchus and LEFT pulmonary artery. 3. Subcarinal nodal metastasis. Electronically Signed   By: Suzy Bouchard M.D.   On: 10/06/2019 20:18   MR Brain W Wo Contrast  Result Date: 10/28/2019 CLINICAL DATA:  Non-small cell lung cancer, staging. EXAM: MRI HEAD WITHOUT AND WITH CONTRAST TECHNIQUE: Multiplanar, multiecho pulse sequences of the brain and surrounding structures were obtained without and with intravenous contrast. CONTRAST:  35mL GADAVIST GADOBUTROL 1 MMOL/ML IV SOLN COMPARISON:  None. FINDINGS: Image quality is degraded by motion artifact.  Brain: Scattered T2/FLAIR hyperintense foci involving the periventricular and subcortical white matter are nonspecific however commonly associated with chronic microvascular ischemic changes. Mild cerebral atrophy with ex vacuo dilatation. No acute infarct or intracranial hemorrhage. No midline shift, ventriculomegaly or extra-axial fluid collection. No mass lesion. No abnormal enhancement. Vascular: Normal flow voids. Skull and upper cervical spine: Normal marrow signal. Sinuses/Orbits: Normal orbits. Clear paranasal sinuses. No mastoid effusion. Other: None. IMPRESSION: No evidence of metastases. Mild cerebral atrophy.  Mild chronic microvascular ischemic changes. Electronically Signed   By: Primitivo Gauze M.D.   On: 10/28/2019 11:07   NM PET Image Initial (PI) Skull Base To Thigh  Result Date: 10/14/2019 CLINICAL DATA:  Initial treatment strategy for lung mass. EXAM: NUCLEAR MEDICINE PET SKULL BASE TO THIGH TECHNIQUE: 10.4 mCi F-18 FDG was injected intravenously. Full-ring PET imaging was performed from the skull base to thigh after the radiotracer. CT data was obtained and used for attenuation correction and anatomic localization. Fasting blood glucose: 130 mg/dl COMPARISON:  Chest CT 10/06/2019 FINDINGS: Mediastinal blood pool activity: SUV max 2.8 Liver activity: SUV max NA NECK: No hypermetabolic lymph nodes in the neck. Absent metabolic activity of the LEFT focal cord consistent with paralysis (related to recurrent laryngeal nerve impingement at the AP window by tumor) Incidental CT findings: none CHEST: Large LEFT suprahilar mass measuring approximately 4.5 x 5.7 cm intensely hypermetabolic SUV max equal 29.4. Lesion measures 7 cm in greatest dimension. Lesion does not appear to invade the chest wall. There is direct extension of hypermetabolic tissue into the AP window with equal metabolic activity. Hypermetabolic subcarinal lymph node with SUV max equal 13.6. No hypermetabolic supraclavicular  nodes. No discrete pulmonary nodules. Incidental CT findings: none ABDOMEN/PELVIS: No abnormal hypermetabolic activity within the liver, pancreas, adrenal glands, or spleen. No hypermetabolic lymph nodes in the abdomen or pelvis. Incidental CT findings: none SKELETON: No focal hypermetabolic activity to suggest skeletal metastasis. Incidental CT findings: none IMPRESSION: 1. Large hypermetabolic LEFT upper lobe mass surrounding the LEFT hilum consistent bronchogenic carcinoma. Lesion extends to the pleural surface but does not invade the chest wall. 2. Direct invasion into the AP window and metastatic subcarinal lymph node. (PET/CT staging: T4 N2 M0 ) 3. No evidence of distant metastatic disease. 4. 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 Electronically Signed   By: Suzy Bouchard M.D.   On: 10/14/2019 14:38   DG Chest Portable 1 View  Result Date: 10/27/2019 CLINICAL DATA:  Shortness of breath EXAM: PORTABLE CHEST 1 VIEW COMPARISON:  10/06/2019.  Chest CT 10/06/2019. FINDINGS: Diffuse airspace disease throughout the left lung, worsening since prior study. This may be related to postobstructive atelectasis or pneumonia related to the large left upper lobe mass seen on prior study. Right lung clear. Heart is normal size. No acute bony abnormality. IMPRESSION: Worsening aeration throughout the left lung, likely related to worsening postobstructive atelectasis or pneumonia. Electronically Signed   By: Rolm Baptise M.D.   On: 10/27/2019 00:09    Labs:  CBC: Recent Labs    10/06/19 1756 10/31/19 0920  WBC 12.2* 9.9  HGB 15.6 14.1  HCT 48.6 44.1  PLT 188 193    COAGS: Recent Labs    10/31/19 0920  INR 1.0  APTT 29    BMP: Recent Labs    10/06/19 1756  NA 139  K 4.1  CL 105  CO2 25  GLUCOSE 111*  BUN 18  CALCIUM 9.2  CREATININE 1.09  GFRNONAA >60  GFRAA >60    LIVER FUNCTION TESTS: Recent Labs    10/06/19 1756    BILITOT 0.8  AST 22  ALT 22  ALKPHOS 92  PROT 8.3*  ALBUMIN 4.3    TUMOR MARKERS: No results for input(s): AFPTM, CEA, CA199, CHROMGRNA in the last 8760 hours.  Assessment and Plan:  Persistent productive cough- nearly 1 yr-- worsening Wt loss Evaluation revealing LUL mass- +PET Scheduled for LULL mass bx per Dr Delton Coombes Risks and benefits of CT guided lung nodule biopsy was discussed with the patient including, but not limited to bleeding, hemoptysis, respiratory failure requiring intubation, infection, pneumothorax requiring chest tube placement, stroke  from air embolism or even death.  All of the patient's questions were answered and the patient is agreeable to proceed.  Consent signed and in chart.   Thank you for this interesting consult.  I greatly enjoyed meeting Alan [name] and look forward to participating in their care.  A copy of this report was sent to the requesting provider on this date.  Electronically Signed: Lavonia Drafts, PA-C 10/31/2019, 9:39 AM   I spent a total of  30 Minutes   in face to face in clinical consultation, greater than 50% of which was counseling/coordinating care for LUL mass bx

## 2019-10-31 NOTE — Procedures (Signed)
Interventional Radiology Procedure Note  Procedure: CT guided biopsy of LUL mass.  Complications: None Recommendations: - Bedrest until CXR cleared.  Minimize talking, coughing or otherwise straining.  - Follow up 1 hr CXR pending  - NPO until CXR cleared  Signed,  Corrie Mckusick, DO

## 2019-10-31 NOTE — Discharge Instructions (Signed)

## 2019-11-01 ENCOUNTER — Encounter (HOSPITAL_COMMUNITY): Payer: Self-pay | Admitting: General Surgery

## 2019-11-01 ENCOUNTER — Ambulatory Visit (HOSPITAL_COMMUNITY)
Admission: RE | Admit: 2019-11-01 | Discharge: 2019-11-01 | Disposition: A | Discharge status: Home / Self Care | Payer: Medicare Other | Attending: General Surgery | Admitting: General Surgery

## 2019-11-01 ENCOUNTER — Encounter (HOSPITAL_COMMUNITY)
Admission: RE | Disposition: A | Discharge status: Home / Self Care | Payer: Self-pay | Source: Home / Self Care | Attending: General Surgery

## 2019-11-01 ENCOUNTER — Ambulatory Visit (HOSPITAL_COMMUNITY): Payer: Medicare Other

## 2019-11-01 ENCOUNTER — Ambulatory Visit (HOSPITAL_COMMUNITY): Payer: Medicare Other | Admitting: Anesthesiology

## 2019-11-01 ENCOUNTER — Other Ambulatory Visit: Payer: Self-pay

## 2019-11-01 DIAGNOSIS — I252 Old myocardial infarction: Secondary | ICD-10-CM | POA: Diagnosis not present

## 2019-11-01 DIAGNOSIS — I4891 Unspecified atrial fibrillation: Secondary | ICD-10-CM

## 2019-11-01 DIAGNOSIS — I10 Essential (primary) hypertension: Secondary | ICD-10-CM | POA: Diagnosis not present

## 2019-11-01 DIAGNOSIS — Z87891 Personal history of nicotine dependence: Secondary | ICD-10-CM | POA: Diagnosis not present

## 2019-11-01 DIAGNOSIS — I2510 Atherosclerotic heart disease of native coronary artery without angina pectoris: Secondary | ICD-10-CM | POA: Diagnosis not present

## 2019-11-01 DIAGNOSIS — C3492 Malignant neoplasm of unspecified part of left bronchus or lung: Secondary | ICD-10-CM

## 2019-11-01 DIAGNOSIS — M1990 Unspecified osteoarthritis, unspecified site: Secondary | ICD-10-CM | POA: Insufficient documentation

## 2019-11-01 DIAGNOSIS — Z79899 Other long term (current) drug therapy: Secondary | ICD-10-CM | POA: Insufficient documentation

## 2019-11-01 DIAGNOSIS — I480 Paroxysmal atrial fibrillation: Secondary | ICD-10-CM | POA: Insufficient documentation

## 2019-11-01 DIAGNOSIS — Z8249 Family history of ischemic heart disease and other diseases of the circulatory system: Secondary | ICD-10-CM | POA: Insufficient documentation

## 2019-11-01 DIAGNOSIS — Z95828 Presence of other vascular implants and grafts: Secondary | ICD-10-CM

## 2019-11-01 HISTORY — PX: PORTACATH PLACEMENT: SHX2246

## 2019-11-01 LAB — CBC
HCT: 42.3 % (ref 39.0–52.0)
Hemoglobin: 13.4 g/dL (ref 13.0–17.0)
MCH: 28 pg (ref 26.0–34.0)
MCHC: 31.7 g/dL (ref 30.0–36.0)
MCV: 88.3 fL (ref 80.0–100.0)
Platelets: 192 10*3/uL (ref 150–400)
RBC: 4.79 MIL/uL (ref 4.22–5.81)
RDW: 13.2 % (ref 11.5–15.5)
WBC: 10.7 10*3/uL — ABNORMAL HIGH (ref 4.0–10.5)
nRBC: 0 % (ref 0.0–0.2)

## 2019-11-01 LAB — BASIC METABOLIC PANEL
Anion gap: 6 (ref 5–15)
BUN: 14 mg/dL (ref 8–23)
CO2: 27 mmol/L (ref 22–32)
Calcium: 8.9 mg/dL (ref 8.9–10.3)
Chloride: 102 mmol/L (ref 98–111)
Creatinine, Ser: 1.04 mg/dL (ref 0.61–1.24)
GFR calc Af Amer: >60 mL/min (ref >60)
GFR calc non Af Amer: >60 mL/min (ref >60)
Glucose, Bld: 110 mg/dL — ABNORMAL HIGH (ref 70–99)
Potassium: 4.1 mmol/L (ref 3.5–5.1)
Sodium: 135 mmol/L (ref 135–145)

## 2019-11-01 LAB — MAGNESIUM: Magnesium: 1.9 mg/dL (ref 1.7–2.4)

## 2019-11-01 LAB — TSH: TSH: 0.488 u[IU]/mL (ref 0.350–4.500)

## 2019-11-01 SURGERY — INSERTION, TUNNELED CENTRAL VENOUS DEVICE, WITH PORT
Anesthesia: General | Site: Chest | Laterality: Right

## 2019-11-01 MED ORDER — DEXAMETHASONE SODIUM PHOSPHATE 4 MG/ML IJ SOLN: 4.0000 mg | Freq: Once | INTRAMUSCULAR | Status: DC

## 2019-11-01 MED ORDER — ESMOLOL HCL 100 MG/10ML IV SOLN
INTRAVENOUS | Status: DC | PRN
  Administered 2019-11-01: 30 mg via INTRAVENOUS

## 2019-11-01 MED ORDER — METOPROLOL TARTRATE 5 MG/5ML IV SOLN
INTRAVENOUS | Status: AC
  Filled 2019-11-01: qty 5

## 2019-11-01 MED ORDER — MIDAZOLAM HCL 5 MG/5ML IJ SOLN
INTRAMUSCULAR | Status: DC | PRN
  Administered 2019-11-01 (×2): 1 mg via INTRAVENOUS

## 2019-11-01 MED ORDER — DEXAMETHASONE SODIUM PHOSPHATE 4 MG/ML IJ SOLN
INTRAMUSCULAR | Status: AC
  Filled 2019-11-01: qty 1

## 2019-11-01 MED ORDER — PHENYLEPHRINE 40 MCG/ML (10ML) SYRINGE FOR IV PUSH (FOR BLOOD PRESSURE SUPPORT)
PREFILLED_SYRINGE | INTRAVENOUS | Status: AC
  Filled 2019-11-01: qty 10

## 2019-11-01 MED ORDER — DEXAMETHASONE SODIUM PHOSPHATE 10 MG/ML IJ SOLN
8.0000 mg | Freq: Once | INTRAMUSCULAR | Status: AC
  Administered 2019-11-01: 8 mg via INTRAVENOUS
  Filled 2019-11-01: qty 0.8

## 2019-11-01 MED ORDER — HEPARIN SOD (PORK) LOCK FLUSH 100 UNIT/ML IV SOLN
INTRAVENOUS | Status: DC | PRN
  Administered 2019-11-01: 500 [IU] via INTRAVENOUS

## 2019-11-01 MED ORDER — ONDANSETRON HCL 4 MG/2ML IJ SOLN
INTRAMUSCULAR | Status: AC
  Filled 2019-11-01: qty 2

## 2019-11-01 MED ORDER — LACTATED RINGERS IV SOLN: Freq: Once | INTRAVENOUS | Status: AC

## 2019-11-01 MED ORDER — ESMOLOL HCL 100 MG/10ML IV SOLN
INTRAVENOUS | Status: AC
  Filled 2019-11-01: qty 10

## 2019-11-01 MED ORDER — LIDOCAINE 2% (20 MG/ML) 5 ML SYRINGE
INTRAMUSCULAR | Status: AC
  Filled 2019-11-01: qty 5

## 2019-11-01 MED ORDER — HEPARIN SOD (PORK) LOCK FLUSH 100 UNIT/ML IV SOLN
INTRAVENOUS | Status: AC
  Filled 2019-11-01: qty 5

## 2019-11-01 MED ORDER — PROPOFOL 10 MG/ML IV BOLUS
INTRAVENOUS | Status: AC
  Filled 2019-11-01: qty 20

## 2019-11-01 MED ORDER — MEPERIDINE HCL 50 MG/ML IJ SOLN: 6.2500 mg | INTRAMUSCULAR | Status: DC | PRN

## 2019-11-01 MED ORDER — ORAL CARE MOUTH RINSE: 15.0000 mL | Freq: Once | OROMUCOSAL | Status: AC

## 2019-11-01 MED ORDER — MIDAZOLAM HCL 2 MG/2ML IJ SOLN
INTRAMUSCULAR | Status: AC
  Filled 2019-11-01: qty 2

## 2019-11-01 MED ORDER — FENTANYL CITRATE (PF) 100 MCG/2ML IJ SOLN
INTRAMUSCULAR | Status: AC
  Filled 2019-11-01: qty 2

## 2019-11-01 MED ORDER — PROPOFOL 10 MG/ML IV BOLUS
INTRAVENOUS | Status: DC | PRN
  Administered 2019-11-01: 20 mg via INTRAVENOUS

## 2019-11-01 MED ORDER — METOPROLOL TARTRATE 25 MG PO TABS
37.5000 mg | ORAL_TABLET | Freq: Once | ORAL | Status: AC
  Administered 2019-11-01: 37.5 mg via ORAL
  Filled 2019-11-01: qty 1

## 2019-11-01 MED ORDER — SODIUM CHLORIDE (PF) 0.9 % IJ SOLN
INTRAMUSCULAR | Status: DC | PRN
  Administered 2019-11-01: 500 mL via INTRAVENOUS

## 2019-11-01 MED ORDER — LIDOCAINE HCL (PF) 1 % IJ SOLN
INTRAMUSCULAR | Status: AC
  Filled 2019-11-01: qty 30

## 2019-11-01 MED ORDER — CHLORHEXIDINE GLUCONATE CLOTH 2 % EX PADS: 6.0000 | MEDICATED_PAD | Freq: Once | CUTANEOUS | Status: DC

## 2019-11-01 MED ORDER — METOPROLOL TARTRATE 25 MG PO TABS: 37.5000 mg | ORAL_TABLET | Freq: Two times a day (BID) | ORAL | 2 refills | Status: DC

## 2019-11-01 MED ORDER — ONDANSETRON HCL 4 MG/2ML IJ SOLN: 4.0000 mg | Freq: Once | INTRAMUSCULAR | Status: DC | PRN

## 2019-11-01 MED ORDER — CEFAZOLIN SODIUM-DEXTROSE 2-4 GM/100ML-% IV SOLN
2.0000 g | INTRAVENOUS | Status: AC
  Administered 2019-11-01: 2 g via INTRAVENOUS

## 2019-11-01 MED ORDER — METOPROLOL TARTRATE 5 MG/5ML IV SOLN
5.0000 mg | Freq: Once | INTRAVENOUS | Status: AC
  Administered 2019-11-01: 5 mg via INTRAVENOUS

## 2019-11-01 MED ORDER — LIDOCAINE HCL (PF) 1 % IJ SOLN
INTRAMUSCULAR | Status: DC | PRN
  Administered 2019-11-01: 6 mL via SUBCUTANEOUS

## 2019-11-01 MED ORDER — PROPOFOL 500 MG/50ML IV EMUL
INTRAVENOUS | Status: DC | PRN
  Administered 2019-11-01: 100 ug/kg/min via INTRAVENOUS

## 2019-11-01 MED ORDER — CEFAZOLIN SODIUM-DEXTROSE 2-4 GM/100ML-% IV SOLN
INTRAVENOUS | Status: AC
  Filled 2019-11-01: qty 100

## 2019-11-01 MED ORDER — CHLORHEXIDINE GLUCONATE 0.12 % MT SOLN
15.0000 mL | Freq: Once | OROMUCOSAL | Status: AC
  Administered 2019-11-01: 15 mL via OROMUCOSAL

## 2019-11-01 MED ORDER — ONDANSETRON HCL 4 MG/2ML IJ SOLN
INTRAMUSCULAR | Status: DC | PRN
  Administered 2019-11-01: 4 mg via INTRAVENOUS

## 2019-11-01 MED ORDER — FENTANYL CITRATE (PF) 100 MCG/2ML IJ SOLN
INTRAMUSCULAR | Status: DC | PRN
Start: 2019-11-01 — End: 2019-11-01
  Administered 2019-11-01 (×2): 50 ug via INTRAVENOUS

## 2019-11-01 MED ORDER — HYDROMORPHONE HCL 1 MG/ML IJ SOLN
0.2500 mg | INTRAMUSCULAR | Status: DC | PRN
  Administered 2019-11-01 (×2): 0.25 mg via INTRAVENOUS
  Filled 2019-11-01: qty 0.5

## 2019-11-01 MED ORDER — METOPROLOL TARTRATE 5 MG/5ML IV SOLN
INTRAVENOUS | Status: DC | PRN
  Administered 2019-11-01: 2 mg via INTRAVENOUS
  Administered 2019-11-01: 3 mg via INTRAVENOUS

## 2019-11-01 SURGICAL SUPPLY — 31 items
ADH SKN CLS APL DERMABOND .7 (GAUZE/BANDAGES/DRESSINGS) ×1
APL PRP STRL LF ISPRP CHG 10.5 (MISCELLANEOUS) ×1
APPLICATOR CHLORAPREP 10.5 ORG (MISCELLANEOUS) ×3 IMPLANT
BAG DECANTER FOR FLEXI CONT (MISCELLANEOUS) ×3 IMPLANT
CLOTH BEACON ORANGE TIMEOUT ST (SAFETY) ×3 IMPLANT
COVER LIGHT HANDLE STERIS (MISCELLANEOUS) ×6 IMPLANT
COVER WAND RF STERILE (DRAPES) ×3 IMPLANT
DECANTER SPIKE VIAL GLASS SM (MISCELLANEOUS) ×3 IMPLANT
DERMABOND ADVANCED (GAUZE/BANDAGES/DRESSINGS) ×2
DERMABOND ADVANCED .7 DNX12 (GAUZE/BANDAGES/DRESSINGS) ×1 IMPLANT
DRAPE C-ARM FOLDED MOBILE STRL (DRAPES) ×3 IMPLANT
ELECT REM PT RETURN 9FT ADLT (ELECTROSURGICAL) ×3
ELECTRODE REM PT RTRN 9FT ADLT (ELECTROSURGICAL) ×1 IMPLANT
GLOVE BIOGEL PI IND STRL 7.0 (GLOVE) ×2 IMPLANT
GLOVE BIOGEL PI INDICATOR 7.0 (GLOVE) ×4
GLOVE SURG SS PI 7.5 STRL IVOR (GLOVE) ×3 IMPLANT
GOWN STRL REUS W/TWL LRG LVL3 (GOWN DISPOSABLE) ×6 IMPLANT
IV NS 500ML (IV SOLUTION) ×3
IV NS 500ML BAXH (IV SOLUTION) ×1 IMPLANT
KIT PORT POWER 8FR ISP MRI (Port) ×3 IMPLANT
KIT TURNOVER KIT A (KITS) ×3 IMPLANT
NDL HYPO 25X1 1.5 SAFETY (NEEDLE) ×1 IMPLANT
NEEDLE HYPO 25X1 1.5 SAFETY (NEEDLE) ×3 IMPLANT
PACK MINOR (CUSTOM PROCEDURE TRAY) ×3 IMPLANT
PAD ARMBOARD 7.5X6 YLW CONV (MISCELLANEOUS) ×3 IMPLANT
SET BASIN LINEN APH (SET/KITS/TRAYS/PACK) ×3 IMPLANT
SUT MNCRL AB 4-0 PS2 18 (SUTURE) ×3 IMPLANT
SUT VIC AB 3-0 SH 27 (SUTURE) ×3
SUT VIC AB 3-0 SH 27X BRD (SUTURE) ×1 IMPLANT
SYR 5ML LL (SYRINGE) ×3 IMPLANT
SYR CONTROL 10ML LL (SYRINGE) ×3 IMPLANT

## 2019-11-01 NOTE — Transfer of Care (Signed)
Immediate Anesthesia Transfer of Care Note  Patient: Alan [name]  Procedure(s) Performed: INSERTION PORT-A-CATH (Right Chest)  Patient Location: PACU  Anesthesia Type:MAC  Level of Consciousness: awake, alert  and oriented  Airway & Oxygen Therapy: Patient Spontanous Breathing and Patient connected to nasal cannula oxygen  Post-op Assessment: Report given to RN and Post -op Vital signs reviewed and stable  Post vital signs: Reviewed and stable  Last Vitals:  Vitals Value Taken Time  BP 123/84 11/01/19 0946  Temp    Pulse 117 11/01/19 0949  Resp 23 11/01/19 0949  SpO2 98 % 11/01/19 0949  Vitals shown include unvalidated device data.  Last Pain:  Vitals:   11/01/19 0759  TempSrc: Oral  PainSc:       Patients Stated Pain Goal: 5 (08/12/92 8546)  Complications: No complications documented.

## 2019-11-01 NOTE — Anesthesia Postprocedure Evaluation (Signed)
Anesthesia Post Note  Patient: VIRGIL LIGHTNER  Procedure(s) Performed: INSERTION PORT-A-CATH (Right Chest)  Patient location during evaluation: PACU Anesthesia Type: General Level of consciousness: awake and alert and oriented Pain management: pain level controlled Vital Signs Assessment: post-procedure vital signs reviewed and stable Respiratory status: spontaneous breathing and respiratory function stable Cardiovascular status: blood pressure returned to baseline and tachycardic Postop Assessment: no apparent nausea or vomiting Anesthetic complications: no Comments: Patient went into rapid atrial fibrillation in OR. Metoprolol 5 mg iv was given. 2nd dose of  metoprolol 5mg  iv was given in PACU as per cardiology.   Dr. Harl Bowie saw the patient in PACU and is ok to go home with po metoprolol. Patient will see Dr.Branch in office on Friday.   No complications documented.   Last Vitals:  Vitals:   11/01/19 1137 11/01/19 1145  BP:    Pulse:    Resp:    Temp:    SpO2: 99% 94%    Last Pain:  Vitals:   11/01/19 1137  TempSrc:   PainSc: 2                  Adley Mazurowski C Toula Miyasaki

## 2019-11-01 NOTE — Consult Note (Addendum)
Cardiology Consult    Patient ID: KAMARII CARTON; [id number]; 03/16/1950   Admit date: 11/01/2019 Date of Consult: 11/01/2019  Primary Care Provider: System, Pcp Not In Primary Cardiologist: New to [facility] - Dr. Harl Bowie  Patient Profile    MARTEZE VECCHIO is a 69 y.o. male with past medical history of HTN, Hyperthyroidism, mild nonobstructive CAD by cath in 2008, remote history of paroxysmal atrial fibrillation by review of notes and prior tobacco use who is being seen today for the evaluation of peri-operative atrial fibrillation at the request of Dr. Arnoldo Morale.   History of Present Illness    Mr. Ellery presented to [facility] ED in 09/2019 for evaluation of a productive cough for the past several months. CT Chest showed a large LEFT suprahilar mass which completely obstructed the LEFT upper lobe bronchus and extended into the LEFT upper lobe to the Apex, consistent with bronchogenic carcinoma. Was referred to Oncology as an outpatient and PET Scan showed direct invasion into the AP window and metastatic subcarinal lymph node with no evidence of distant metastatic disease. MRI Brain without evidence of metastases. Underwent CT guided biopsy on 9/13 with pathology results pending.   Presented to [facility] today for Port-A-Cath insertion by Dr. Arnoldo Morale and during the procedure, went into atrial fibrillation with RVR. Received Esmolol 30mg  and IV Lopressor (2mg  followed by 3mg ) while in the OR around 0930. Rates currently variable in the 110's to 130's. He is asymptomatic at this time.   He reports a history of atrial fibrillation but is unsure of the details surrounding this or when he was diagnosed. Does not have a PCP. Says he does experience intermittent palpitations at home sporadically but is asymptomatic at this time. No associated chest pain, dyspnea, dizziness or presyncope.    Past Medical History:  Diagnosis Date   Arthritis    Hyperlipemia    Hypertension    Hyperthyroidism     Myocardial infarction ([place])    Paroxysmal atrial fibrillation ([place])    Tachycardia     Past Surgical History:  Procedure Laterality Date   CARDIAC CATHETERIZATION       Home Medications:  Prior to Admission medications   Medication Sig Start Date End Date Taking? Authorizing Provider  acetaminophen (TYLENOL) 500 MG tablet Take 500 mg by mouth every 6 (six) hours as needed for moderate pain or headache.   Yes [provider]  albuterol (PROVENTIL) (2.5 MG/3ML) 0.083% nebulizer solution Take 3 mLs (2.5 mg total) by nebulization every 6 (six) hours as needed for wheezing or shortness of breath. 10/27/19  Yes Pollina, Gwenyth Allegra, MD  amoxicillin-clavulanate (AUGMENTIN) 875-125 MG tablet Take 1 tablet by mouth 2 (two) times daily. 10/27/19  Yes Pollina, Gwenyth Allegra, MD  HYDROcodone-homatropine [facility]) 5-1.5 MG/5ML syrup Take 15 mLs by mouth every 8 (eight) hours as needed for cough. 10/20/19  Yes Lockamy, Randi L, NP-C  ibuprofen (ADVIL) 200 MG tablet Take 200-400 mg by mouth every 6 (six) hours as needed for headache or moderate pain.    Yes [provider]  LORazepam (ATIVAN) 1 MG tablet Take 1 mg by mouth once.   Yes [provider]    Inpatient Medications: Scheduled Meds:  Chlorhexidine Gluconate Cloth  6 each Topical Once   And   Chlorhexidine Gluconate Cloth  6 each Topical Once   Continuous Infusions:  PRN Meds: heparin lock flush, HYDROmorphone (DILAUDID) injection, lidocaine (PF), meperidine (DEMEROL) injection, ondansetron (ZOFRAN) IV, sodium chloride (PF)  Allergies:  No Known Allergies  Social History:   Social History   Socioeconomic History   Marital status: Married    Spouse name: Not on file   Number of children: 1   Years of education: Not on file   Highest education level: Not on file  Occupational History   Occupation: retired   Occupation: Glass blower/designer: [employer]  Tobacco Use   Smoking status: Former  Smoker    Years: 45.00    Types: Cigars, Cigarettes    Quit date: 10/19/2014    Years since quitting: 5.0   Smokeless tobacco: Never Used  Vaping Use   Vaping Use: Never used  Substance and Sexual Activity   Alcohol use: Never   Drug use: No   Sexual activity: Not on file  Other Topics Concern   Not on file  Social History Narrative   Not on file   Social Determinants of Health   Financial Resource Strain: Medium Risk   Difficulty of Paying Living Expenses: Somewhat hard  Food Insecurity: No Food Insecurity   Worried About Charity fundraiser in the Last Year: Never true   [place] in the Last Year: Never true  Transportation Needs: No Transportation Needs   Lack of Transportation (Medical): No   Lack of Transportation (Non-Medical): No  Physical Activity: Inactive   Days of Exercise per Week: 0 days   Minutes of Exercise per Session: 0 min  Stress: Stress Concern Present   Feeling of Stress : Very much  Social Connections: Moderately Isolated   Frequency of Communication with Friends and Family: More than three times a week   Frequency of Social Gatherings with Friends and Family: More than three times a week   Attends Religious Services: Never   Marine scientist or Organizations: No   Attends Music therapist: Never   Marital Status: Married  Human resources officer Violence: Not At Risk   Fear of Current or Ex-Partner: No   Emotionally Abused: No   Physically Abused: No   Sexually Abused: No     Family History:    Family History  Problem Relation Age of Onset   Hypertension Father    Dementia Father        died age 34   Heart disease Father    Heart failure Other       Review of Systems    General:  No chills, fever, night sweats or weight changes.  Cardiovascular:  No chest pain, dyspnea on exertion, edema, orthopnea, palpitations, paroxysmal nocturnal dyspnea. Dermatological: No rash,  lesions/masses Respiratory: Positive for cough and dyspnea.  Urologic: No hematuria, dysuria Abdominal:   No nausea, vomiting, diarrhea, bright red blood per rectum, melena, or hematemesis Neurologic:  No visual changes, wkns, changes in mental status. All other systems reviewed and are otherwise negative except as noted above.  Physical Exam/Data    Vitals:   11/01/19 0754 11/01/19 0759 11/01/19 0945 11/01/19 1000  BP:  (!) 174/70 123/84   Pulse:  (!) 58  (!) 47  Resp:  15 20   Temp:  98.4 F (36.9 C) 97.6 F (36.4 C)   TempSrc:  Oral    SpO2:  97% 100% 100%  Weight: 83.5 kg     Height: 5\' 5"  (1.651 m)       Intake/Output Summary (Last 24 hours) at 11/01/2019 1007 Last data filed at 11/01/2019 0943 Gross per 24 hour  Intake 800 ml  Output  5 ml  Net 795 ml   Filed Weights   11/01/19 0754  Weight: 83.5 kg   Body mass index is 30.62 kg/m.   General: Pleasant male appearing in NAD. Bear-hugger in place.  Psych: Normal affect. Neuro: Alert and oriented X 3. Moves all extremities spontaneously. HEENT: Normal  Neck: Supple without bruits or JVD. Lungs:  Resp regular and unlabored, CTA. Heart: Irregularly irregular. No s3, s4, or murmurs. Abdomen: Soft, non-tender, non-distended, BS + x 4.  Extremities: No clubbing, cyanosis or edema. DP/PT/Radials 2+ and equal bilaterally.   EKG:  The EKG was personally reviewed and demonstrates: Atrial fibrillation with RVR, HR 118.   Labs/Studies     Relevant CV Studies:  Cardiac Catheterization: 2008 RESULTS:  Left main coronary artery:  The left main coronary artery was  free of disease.    Left anterior descending artery:  The left descending artery gave rise  to the diagonal Cherril Hett and two septal perforators.  There was 50%  narrowing after the first diagonal Maryruth Apple.   Circumflex artery:  The circumflex artery gave rise to a small marginal  Donia Yokum, a large marginal Jocelyn Lowery and two posterolateral branches.  There  was  30% mid and 40%  distal stenosis in circumflex artery.   Right coronary artery:  The right coronary artery was a moderate-sized  vessel gave rise to the conus Sayge Brienza, right ventricle Saira Kramme, posterior  descending Lucylle Foulkes and two posterolateral branches.  There was 30%  narrowing in the proximal right coronary.   The left ventriculogram performed in the RAO projection showed good wall  motion with no areas of hypokinesis.  Estimated ejection fraction was  60%.   CONCLUSION:  Nonobstructive coronary artery disease with 50% narrowing  in the proximal LAD, 30% mid and 40% distal stenosis in the circumflex  artery, and 30% stenosis in the mid right coronary artery with normal LV  function.   RECOMMENDATIONS:  Reassurance.   Laboratory Data:  ChemistryNo results for input(s): NA, K, CL, CO2, GLUCOSE, BUN, CREATININE, CALCIUM, GFRNONAA, GFRAA, ANIONGAP in the last 168 hours.  No results for input(s): PROT, ALBUMIN, AST, ALT, ALKPHOS, BILITOT in the last 168 hours. Hematology Recent Labs  Lab 10/31/19 0920  WBC 9.9  RBC 5.14  HGB 14.1  HCT 44.1  MCV 85.8  MCH 27.4  MCHC 32.0  RDW 13.0  PLT 193   Cardiac EnzymesNo results for input(s): TROPONINI in the last 168 hours. No results for input(s): TROPIPOC in the last 168 hours.  BNPNo results for input(s): BNP, PROBNP in the last 168 hours.  DDimer No results for input(s): DDIMER in the last 168 hours.  Radiology/Studies:  MR Brain W Wo Contrast  Result Date: 10/28/2019 CLINICAL DATA:  Non-small cell lung cancer, staging. EXAM: MRI HEAD WITHOUT AND WITH CONTRAST TECHNIQUE: Multiplanar, multiecho pulse sequences of the brain and surrounding structures were obtained without and with intravenous contrast. CONTRAST:  11mL GADAVIST GADOBUTROL 1 MMOL/ML IV SOLN COMPARISON:  None. FINDINGS: Image quality is degraded by motion artifact. Brain: Scattered T2/FLAIR hyperintense foci involving the periventricular and subcortical white matter  are nonspecific however commonly associated with chronic microvascular ischemic changes. Mild cerebral atrophy with ex vacuo dilatation. No acute infarct or intracranial hemorrhage. No midline shift, ventriculomegaly or extra-axial fluid collection. No mass lesion. No abnormal enhancement. Vascular: Normal flow voids. Skull and upper cervical spine: Normal marrow signal. Sinuses/Orbits: Normal orbits. Clear paranasal sinuses. No mastoid effusion. Other: None. IMPRESSION: No evidence of metastases. Mild cerebral atrophy.  Mild chronic microvascular ischemic changes. Electronically Signed   By: Primitivo Gauze M.D.   On: 10/28/2019 11:07   CT Biopsy  Result Date: 10/31/2019 INDICATION: 69 year old male with a history of left upper lobe mass, FDG avid, likely primary lung carcinoma EXAM: CT BIOPSY MEDICATIONS: None. ANESTHESIA/SEDATION: Moderate (conscious) sedation was employed during this procedure. A total of Versed 1.0 mg and Fentanyl 50 mcg was administered intravenously. Moderate Sedation Time: 11 minutes. The patient's level of consciousness and vital signs were monitored continuously by radiology nursing throughout the procedure under my direct supervision. FLUOROSCOPY TIME:  CT COMPLICATIONS: None PROCEDURE: The procedure, risks, benefits, and alternatives were explained to the patient and the patient's family. Specific risks that were addressed included bleeding, infection, pneumothorax, need for further procedure including chest tube placement, chance of delayed pneumothorax or hemorrhage, hemoptysis, nondiagnostic sample, cardiopulmonary collapse, death. Questions regarding the procedure were encouraged and answered. The patient understands and consents to the procedure. Patient was positioned in the supine position on the CT gantry table and a scout CT of the chest was performed for planning purposes. Once angle of approach was determined, the skin and subcutaneous tissues this scan was prepped and  draped in the usual sterile fashion, and a sterile drape was applied covering the operative field. A sterile gown and sterile gloves were used for the procedure. Local anesthesia was provided with 1% Lidocaine. The skin and subcutaneous tissues were infiltrated 1% lidocaine for local anesthesia, and a small stab incision was made with an 11 blade scalpel. Using CT guidance, a 17 gauge trocar needle was advanced into the left upper lobetarget. After confirmation of the tip, separate 18 gauge core biopsies were performed. These were placed into solution for transportation to the lab. Biosentry Device was deployed. A final CT image was performed. Patient tolerated the procedure well and remained hemodynamically stable throughout. No complications were encountered and no significant blood loss was encounter IMPRESSION: Status post CT-guided biopsy of left upper lobe mass Signed, Dulcy Fanny. Dellia Nims, RPVI [facility] Radiology Electronically Signed   By: Corrie Mckusick D.O.   On: 10/31/2019 13:08   DG Chest Port 1 View  Result Date: 10/31/2019 CLINICAL DATA:  Post left upper lobe lung mass biopsy. EXAM: PORTABLE CHEST 1 VIEW COMPARISON:  Chest x-ray dated October 27, 2019. FINDINGS: Normal heart size. Continued complete collapse of the left upper lobe and fullness of the left hilum related to underlying large mass. The right lung is clear. No pleural effusion or pneumothorax. No acute osseous abnormality. IMPRESSION: 1. No pneumothorax status post left upper lobe lung mass biopsy. 2. Continued complete left upper lobe collapse. Electronically Signed   By: Titus Dubin M.D.   On: 10/31/2019 14:34   DG C-Arm 1-60 Min-No Report  Result Date: 11/01/2019 Fluoroscopy was utilized by the requesting physician.  No radiographic interpretation.     Assessment & Plan    1. Peri-operative Atrial Fibrillation - He has a history of atrial fibrillation and in unable to  recall specifically when this was diagnosed but was in atrial fibrillation by prior EKG's in 2012. In NSR by EKG tracings in 09/2019 and earlier this month. Went into atrial fibrillation during his procedure this morning and is asymptomatic at this time. Received Esmolol 30mg  and IV Lopressor (2mg  followed by 3mg ) while in the OR around 0930. Given HR remains elevated, will give an additional 5mg  of IV Lopressor at this time. Pending reassessment of HR and BP,  could also try IV Cardizem. If rates improve, could likely discharge home on rate-controlling medications given his asymptomatic state.  - Will check CBC, BMET, TSH and Mg. Would recommend an echocardiogram as an outpatient for assessment of LV function and wall motion. Would try to get prior to initiation of chemotherapy to have a baseline.  - This patients CHA2DS2-VASc Score and unadjusted Ischemic Stroke Rate (% per year) is equal to 3.2 % stroke rate/year from a score of 3 (HTN, Vascular, Age). Not a good candidate for anticoagulation at this time given his recent lung biopsy and port placement, therefore DCCV not ideal.   2. Lung Cancer - Recently diagnosed with bronchogenic carcinoma last month. Underwent CT guided biopsy on 9/13 with pathology results pending. Underwent Port-A-Cath insertion by Dr. Arnoldo Morale earlier today.  - Followed by Oncology as an outpatient.   3. Hyperthyroidism - Listed in the PMH but he has not been on prescription medications in 10+ years. Recheck TSH as outlined above.   For questions or updates, please contact [place] Please consult [email or web address] for contact info under Cardiology/STEMI.  Signed, Erma Heritage, PA-C 11/01/2019, 10:07 AM Pager: [phone]   Attending ntoe Patient seen and discussed with PA Ahmed Prima, I agree with her documentation. 69 yo male history of HTN, hyperthyroid, remote history of PAF, lung cancer. Presented for outpatient port-a-cath placement, periop had afib with  elevated HRs asymptomatic.    WBC 10.7 Hgb 13.4 Plt 192 K 4.1 Cr 1.04 Mg 1.9   History of PAF, the prior management is unclear. He had not been on anticoag at home. He reports several years ago he thinks he was on a blood thinner, but he stopped due to easy bruising. Start lopressor 37.5mg  bid, hold on anticoag given port a cath placement but would start at nursing visit this Friday when comes for EKG and vitals check, will need outpatient echo arranged at that time also. Of note brain MRI no mets, ok for anticoag from that standpoint. Can be discharged home from [place] MD

## 2019-11-01 NOTE — Addendum Note (Signed)
Addendum  created 11/01/19 1236 by Denese Killings, MD   Order list changed

## 2019-11-01 NOTE — Op Note (Signed)
Patient:  Alan [name]  DOB:  [birth year]  MRN:  [id number]   Preop Diagnosis: Left lung carcinoma  Postop Diagnosis: Same  Procedure: Port-A-Cath insertion  Surgeon: Aviva Signs, MD  Anes: MAC  Indications: Patient is a 69 year old white male who has a newly diagnosed left lung carcinoma and is in need for central venous access.  The risks and benefits of the procedure including bleeding, infection, and pneumothorax were fully explained to the patient, who gave informed consent.  Procedure note: The patient was placed in the Trendelenburg position after the right upper chest was prepped and draped using the usual sterile technique with ChloraPrep.  Surgical site confirmation was performed.  1% Xylocaine was used for local anesthesia.  An incision was made below the right clavicle.  A subcutaneous pocket was formed.  A needle was advanced into the right subclavian vein using the Seldinger technique without difficulty.  A guidewire was then passed into the right atrium under fluoroscopic guidance.  An introducer and peel-away sheath were placed over the guidewire.  The catheter was inserted through the peel-away sheath and the peel-away sheath was removed.  The catheter was then attached to the port and the port placed in subcutaneous pocket.  Adequate positioning was confirmed by fluoroscopy.  Good backflow of venous blood was noted on aspiration of the port.  The port was flushed with heparin flush.  The subcutaneous layer was reapproximated using a 3-0 Vicryl interrupted suture.  The skin was closed using a 4-0 Monocryl subcuticular suture.  Dermabond was applied.  During the procedure, the patient did develop rapid atrial fibrillation.  This continued at the end of the procedure and a cardiology consultation will be obtained in the PACU.  All tape needle counts were correct at the end of the procedure.  The patient was transferred to PACU in stable condition.  A chest x-ray will be  performed at that time.  Complications: Rapid atrial fibrillation  EBL: Minimal  Specimen: None

## 2019-11-01 NOTE — Addendum Note (Signed)
Addendum  created 11/01/19 1221 by Denese Killings, MD   Order list changed

## 2019-11-01 NOTE — Discharge Instructions (Signed)
Implanted [facility] Guide An implanted port is a device that is placed under the skin. It is usually placed in the chest. The device can be used to give IV medicine, to take blood, or for dialysis. You may have an implanted port if:  You need IV medicine that would be irritating to the small veins in your hands or arms.  You need IV medicines, such as antibiotics, for a long period of time.  You need IV nutrition for a long period of time.  You need dialysis. Having a port means that your health care provider will not need to use the veins in your arms for these procedures. You may have fewer limitations when using a port than you would if you used other types of long-term IVs, and you will likely be able to return to normal activities after your incision heals. An implanted port has two main parts:  Reservoir. The reservoir is the part where a needle is inserted to give medicines or draw blood. The reservoir is round. After it is placed, it appears as a small, raised area under your skin.  Catheter. The catheter is a thin, flexible tube that connects the reservoir to a vein. Medicine that is inserted into the reservoir goes into the catheter and then into the vein. How is my port accessed? To access your port:  A numbing cream may be placed on the skin over the port site.  Your health care provider will put on a mask and sterile gloves.  The skin over your port will be cleaned carefully with a germ-killing soap and allowed to dry.  Your health care provider will gently pinch the port and insert a needle into it.  Your health care provider will check for a blood return to make sure the port is in the vein and is not clogged.  If your port needs to remain accessed to get medicine continuously (constant infusion), your health care provider will place a clear bandage (dressing) over the needle site. The dressing and needle will need to be changed every week, or as told by your health care  provider. What is flushing? Flushing helps keep the port from getting clogged. Follow instructions from your health care provider about how and when to flush the port. Ports are usually flushed with saline solution or a medicine called heparin. The need for flushing will depend on how the port is used:  If the port is only used from time to time to give medicines or draw blood, the port may need to be flushed: ? Before and after medicines have been given. ? Before and after blood has been drawn. ? As part of routine maintenance. Flushing may be recommended every 4-6 weeks.  If a constant infusion is running, the port may not need to be flushed.  Throw away any syringes in a disposal container that is meant for sharp items (sharps container). You can buy a sharps container from a pharmacy, or you can make one by using an empty hard plastic bottle with a cover. How long will my port stay implanted? The port can stay in for as long as your health care provider thinks it is needed. When it is time for the port to come out, a surgery will be done to remove it. The surgery will be similar to the procedure that was done to put the port in. Follow these instructions at home:   Flush your port as told by your health care provider.  If you need an infusion over several days, follow instructions from your health care provider about how to take care of your port site. Make sure you: ? Wash your hands with soap and water before you change your dressing. If soap and water are not available, use alcohol-based hand sanitizer. ? Change your dressing as told by your health care provider. ? Place any used dressings or infusion bags into a plastic bag. Throw that bag in the trash. ? Keep the dressing that covers the needle clean and dry. Do not get it wet. ? Do not use scissors or sharp objects near the tube. ? Keep the tube clamped, unless it is being used.  Check your port site every day for signs of  infection. Check for: ? Redness, swelling, or pain. ? Fluid or blood. ? Pus or a bad smell.  Protect the skin around the port site. ? Avoid wearing bra straps that rub or irritate the site. ? Protect the skin around your port from seat belts. Place a soft pad over your chest if needed.  Bathe or shower as told by your health care provider. The site may get wet as long as you are not actively receiving an infusion.  Return to your normal activities as told by your health care provider. Ask your health care provider what activities are safe for you.  Carry a medical alert card or wear a medical alert bracelet at all times. This will let health care providers know that you have an implanted port in case of an emergency. Get help right away if:  You have redness, swelling, or pain at the port site.  You have fluid or blood coming from your port site.  You have pus or a bad smell coming from the port site.  You have a fever. Summary  Implanted ports are usually placed in the chest for long-term IV access.  Follow instructions from your health care provider about flushing the port and changing bandages (dressings).  Take care of the area around your port by avoiding clothing that puts pressure on the area, and by watching for signs of infection.  Protect the skin around your port from seat belts. Place a soft pad over your chest if needed.  Get help right away if you have a fever or you have redness, swelling, pain, drainage, or a bad smell at the port site. This information is not intended to replace advice given to you by your health care provider. Make sure you discuss any questions you have with your health care provider. Document Revised: 05/28/2018 Document Reviewed: 03/08/2016 Elsevier Patient Education  [place].  Atrial Fibrillation  Atrial fibrillation is a type of heartbeat that is irregular or fast. If you have this condition, your heart beats without any  order. This makes it hard for your heart to pump blood in a normal way. Atrial fibrillation may come and go, or it may become a long-lasting problem. If this condition is not treated, it can put you at higher risk for stroke, heart failure, and other heart problems. What are the causes? This condition may be caused by diseases that damage the heart. They include:  High blood pressure.  Heart failure.  Heart valve disease.  Heart surgery. Other causes include:  Diabetes.  Thyroid disease.  Being overweight.  Kidney disease. Sometimes the cause is not known. What increases the risk? You are more likely to develop this condition if:  You are older.  You smoke.  You exercise often and very hard.  You have a family history of this condition.  You are a man.  You use drugs.  You drink a lot of alcohol.  You have lung conditions, such as emphysema, pneumonia, or COPD.  You have sleep apnea. What are the signs or symptoms? Common symptoms of this condition include:  A feeling that your heart is beating very fast.  Chest pain or discomfort.  Feeling short of breath.  Suddenly feeling light-headed or weak.  Getting tired easily during activity.  Fainting.  Sweating. In some cases, there are no symptoms. How is this treated? Treatment for this condition depends on underlying conditions and how you feel when you have atrial fibrillation. They include:  Medicines to: ? Prevent blood clots. ? Treat heart rate or heart rhythm problems.  Using devices, such as a pacemaker, to correct heart rhythm problems.  Doing surgery to remove the part of the heart that sends bad signals.  Closing an area where clots can form in the heart (left atrial appendage). In some cases, your doctor will treat other underlying conditions. Follow these instructions at home: Medicines  Take over-the-counter and prescription medicines only as told by your doctor.  Do not take any  new medicines without first talking to your doctor.  If you are taking blood thinners: ? Talk with your doctor before you take any medicines that have aspirin or NSAIDs, such as ibuprofen, in them. ? Take your medicine exactly as told by your doctor. Take it at the same time each day. ? Avoid activities that could hurt or bruise you. Follow instructions about how to prevent falls. ? Wear a bracelet that says you are taking blood thinners. Or, carry a card that lists what medicines you take. Lifestyle      Do not use any products that have nicotine or tobacco in them. These include cigarettes, e-cigarettes, and chewing tobacco. If you need help quitting, ask your doctor.  Eat heart-healthy foods. Talk with your doctor about the right eating plan for you.  Exercise regularly as told by your doctor.  Do not drink alcohol.  Lose weight if you are overweight.  Do not use drugs, including cannabis. General instructions  If you have a condition that causes breathing to stop for a short period of time (apnea), treat it as told by your doctor.  Keep a healthy weight. Do not use diet pills unless your doctor says they are safe for you. Diet pills may make heart problems worse.  Keep all follow-up visits as told by your doctor. This is important. Contact a doctor if:  You notice a change in the speed, rhythm, or strength of your heartbeat.  You are taking a blood-thinning medicine and you get more bruising.  You get tired more easily when you move or exercise.  You have a sudden change in weight. Get help right away if:   You have pain in your chest or your belly (abdomen).  You have trouble breathing.  You have side effects of blood thinners, such as blood in your vomit, poop (stool), or pee (urine), or bleeding that cannot stop.  You have any signs of a stroke. "BE FAST" is an easy way to remember the main warning signs: ? B - Balance. Signs are dizziness, sudden trouble  walking, or loss of balance. ? E - Eyes. Signs are trouble seeing or a change in how you see. ? F - Face. Signs are sudden weakness or loss  of feeling in the face, or the face or eyelid drooping on one side. ? A - Arms. Signs are weakness or loss of feeling in an arm. This happens suddenly and usually on one side of the body. ? S - Speech. Signs are sudden trouble speaking, slurred speech, or trouble understanding what people say. ? T - Time. Time to call emergency services. Write down what time symptoms started.  You have other signs of a stroke, such as: ? A sudden, very bad headache with no known cause. ? Feeling like you may vomit (nausea). ? Vomiting. ? A seizure. These symptoms may be an emergency. Do not wait to see if the symptoms will go away. Get medical help right away. Call your local emergency services (911 in the [place] Do not drive yourself to the hospital. Summary  Atrial fibrillation is a type of heartbeat that is irregular or fast.  You are at higher risk of this condition if you smoke, are older, have diabetes, or are overweight.  Follow your doctor's instructions about medicines, diet, exercise, and follow-up visits.  Get help right away if you have signs or symptoms of a stroke.  Get help right away if you cannot catch your breath, or you have chest pain or discomfort. This information is not intended to replace advice given to you by your health care provider. Make sure you discuss any questions you have with your health care provider. Document Revised: 07/28/2018 Document Reviewed: 07/28/2018 Elsevier Patient Education  [place].

## 2019-11-01 NOTE — Anesthesia Preprocedure Evaluation (Addendum)
Anesthesia Evaluation  Patient identified by MRN, date of birth, ID band Patient awake    Reviewed: Allergy & Precautions, NPO status , Patient's Chart, lab work & pertinent test results  History of Anesthesia Complications Negative for: history of anesthetic complications  Airway Mallampati: II  TM Distance: >3 FB Neck ROM: Full    Dental  (+) Edentulous Upper, Edentulous Lower   Pulmonary former smoker,  Left lung cancer IMPRESSION: 1. Large LEFT suprahilar mass which completely obstructing the LEFT upper lobe bronchus and extends into the LEFT upper lobe to the apex. Findings consistent with bronchogenic carcinoma. 2. Direct invasion into the the mediastinum through the AP window with e narrowing of the LEFT mainstem bronchus and LEFT pulmonary artery. 3. Subcarinal nodal metastasis.   Electronically Signed   By: Suzy Bouchard M.D.   On: 10/06/2019 20:18   Pulmonary exam normal breath sounds clear to auscultation       Cardiovascular Exercise Tolerance: Poor hypertension, Pt. on medications + Past MI  Normal cardiovascular exam+ dysrhythmias Atrial Fibrillation  Rhythm:Regular Rate:Normal  26-Oct-2019 22:36:57 [facility]-ED ROUTINE RECORD Normal sinus rhythm Normal ECG Confirmed by Elnora Morrison [phone]) on 10/27/2019 10:21:13 PM   Neuro/Psych negative neurological ROS  negative psych ROS   GI/Hepatic negative GI ROS, Neg liver ROS,   Endo/Other  Hyperthyroidism Discussed with Dr. Arnoldo Morale regarding hyperthyroidism and risk of thyroid storm/crisis. Patient is not symptomatic and doesn't know anything about hyperthyroidism. Patient has no primary care physician. Decided to proceed because pt has Large lung mass and low risk procedure.  Renal/GU negative Renal ROS  negative genitourinary   Musculoskeletal  (+) Arthritis , Osteoarthritis,    Abdominal   Peds  Hematology negative  hematology ROS (+)   Anesthesia Other Findings IMPRESSION: 1. Large LEFT suprahilar mass which completely obstructing the LEFT upper lobe bronchus and extends into the LEFT upper lobe to the apex. Findings consistent with bronchogenic carcinoma. 2. Direct invasion into the the mediastinum through the AP window with e narrowing of the LEFT mainstem bronchus and LEFT pulmonary artery. 3. Subcarinal nodal metastasis.   Electronically Signed   By: Suzy Bouchard M.D.   On: 10/06/2019 20:18  Reproductive/Obstetrics                          Anesthesia Physical Anesthesia Plan  ASA: IV  Anesthesia Plan: General   Post-op Pain Management:    Induction: Intravenous  PONV Risk Score and Plan: TIVA  Airway Management Planned: Nasal Cannula, Natural Airway and Simple Face Mask  Additional Equipment:   Intra-op Plan:   Post-operative Plan:   Informed Consent: I have reviewed the patients History and Physical, chart, labs and discussed the procedure including the risks, benefits and alternatives for the proposed anesthesia with the patient or authorized representative who has indicated his/her understanding and acceptance.     Dental advisory given  Plan Discussed with: CRNA and Surgeon  Anesthesia Plan Comments: (Discussed with Dr. Arnoldo Morale regarding hyperthyroidism and risk of thyroid storm/crisis. Patient is not symptomatic and doesn't know anything about hyperthyroidism. Patient has no primary care physician. Decided to proceed because pt has Large lung mass and low risk procedure)      Anesthesia Quick Evaluation

## 2019-11-01 NOTE — Interval H&P Note (Signed)
History and Physical Interval Note:  11/01/2019 8:18 AM  Alan [name]  has presented today for surgery, with the diagnosis of Left lung cancer.  The various methods of treatment have been discussed with the patient and family. After consideration of risks, benefits and other options for treatment, the patient has consented to  Procedure(s) with comments: INSERTION PORT-A-CATH (Right) - pt having covid test 9/10 for another procedure, should fine for this one too as a surgical intervention.  The patient's history has been reviewed, patient examined, no change in status, stable for surgery.  I have reviewed the patient's chart and labs.  Questions were answered to the patient's satisfaction.     [facility] Signs

## 2019-11-02 ENCOUNTER — Encounter (HOSPITAL_COMMUNITY): Payer: Self-pay | Admitting: General Surgery

## 2019-11-02 LAB — SURGICAL PATHOLOGY

## 2019-11-04 ENCOUNTER — Ambulatory Visit (INDEPENDENT_AMBULATORY_CARE_PROVIDER_SITE_OTHER): Payer: Medicare Other

## 2019-11-04 ENCOUNTER — Other Ambulatory Visit: Payer: Self-pay

## 2019-11-04 VITALS — BP 134/64 | HR 103 | Ht 63.0 in | Wt 177.0 lb

## 2019-11-04 DIAGNOSIS — I4891 Unspecified atrial fibrillation: Secondary | ICD-10-CM

## 2019-11-04 MED ORDER — APIXABAN 5 MG PO TABS: 5.0000 mg | ORAL_TABLET | Freq: Two times a day (BID) | ORAL | 6 refills | Status: DC

## 2019-11-04 NOTE — Patient Instructions (Addendum)
Medication Instructions:  START Eliquis 5 mg twice a day  *If you need a refill on your cardiac medications before your next appointment, please call your pharmacy*   Lab Work: None today If you have labs (blood work) drawn today and your tests are completely normal, you will receive your results only by: Marland Kitchen MyChart Message (if you have MyChart) OR . A paper copy in the mail If you have any lab test that is abnormal or we need to change your treatment, we will call you to review the results.   Testing/Procedures: Your physician has requested that you have an echocardiogram. Echocardiography is a painless test that uses sound waves to create images of your heart. It provides your doctor with information about the size and shape of your heart and how well your heart's chambers and valves are working. This procedure takes approximately one hour. There are no restrictions for this procedure.     Follow-Up: At [facility], you and your health needs are our priority.  As part of our continuing mission to provide you with exceptional heart care, we have created designated Provider Care Teams.  These Care Teams include your primary Cardiologist (physician) and Advanced Practice Providers (APPs -  Physician Assistants and Nurse Practitioners) who all work together to provide you with the care you need, when you need it.  We recommend signing up for the patient portal called "MyChart".  Sign up information is provided on this After Visit Summary.  MyChart is used to connect with patients for Virtual Visits (Telemedicine).  Patients are able to view lab/test results, encounter notes, upcoming appointments, etc.  Non-urgent messages can be sent to your provider as well.   To learn more about what you can do with MyChart, go to [email or web address].    Your next appointment:   1 month(s)  The format for your next appointment:   In Person  Provider:   Bernerd Pho, PA-C or Ermalinda Barrios,  PA-C   Other Instructions None       Thank you for choosing [facility] !

## 2019-11-04 NOTE — Progress Notes (Addendum)
Seen by Dr.Branch today at nurse visit.Ordered echo, start Eliquis 5 mg twice a day and 1 month follow done with APP    Samples:Eliquis 5 mg # 56 (4 boxes) lot [id number], exp 05/2020

## 2019-11-08 ENCOUNTER — Other Ambulatory Visit: Payer: Self-pay

## 2019-11-08 ENCOUNTER — Other Ambulatory Visit (HOSPITAL_COMMUNITY): Payer: Self-pay | Admitting: *Deleted

## 2019-11-08 ENCOUNTER — Inpatient Hospital Stay (HOSPITAL_COMMUNITY): Payer: Medicare Other | Admitting: Hematology

## 2019-11-08 ENCOUNTER — Encounter (HOSPITAL_COMMUNITY): Payer: Self-pay | Admitting: Hematology

## 2019-11-08 VITALS — BP 124/72 | HR 75 | Temp 96.9°F | Resp 18 | Wt 171.5 lb

## 2019-11-08 DIAGNOSIS — C3492 Malignant neoplasm of unspecified part of left bronchus or lung: Secondary | ICD-10-CM | POA: Diagnosis not present

## 2019-11-08 DIAGNOSIS — C3412 Malignant neoplasm of upper lobe, left bronchus or lung: Secondary | ICD-10-CM | POA: Diagnosis not present

## 2019-11-08 MED ORDER — HYDROCODONE-HOMATROPINE 5-1.5 MG/5ML PO SYRP
15.0000 mL | ORAL_SOLUTION | Freq: Three times a day (TID) | ORAL | 0 refills | Status: DC | PRN
Start: 2019-11-08 — End: 2019-11-11

## 2019-11-08 MED ORDER — ALPRAZOLAM 0.25 MG PO TABS: 0.2500 mg | ORAL_TABLET | Freq: Three times a day (TID) | ORAL | 0 refills | Status: DC | PRN

## 2019-11-08 NOTE — Progress Notes (Signed)
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    CANCER STAGING: Cancer Staging No matching staging information was found for the patient.  INTERVAL HISTORY:  Alan [name], a 69 y.o. male, returns for routine follow-up of his left squamous cell lung cancer. Alan [name] was last seen on 10/19/2019.   Today he is accompanied by his wife. His productive cough continues being bothersome and waking him up at night, but denies hemoptysis or CP. He finished Augmentin on evening of 9/19. He has become more anxious since the cancer diagnosis, but he was never prescribed treatment for it. He denies numbness or tingling in his hands or feet. His appetite has been severely diminished since he started having issues from his lung cancer.   He is retired.   REVIEW OF SYSTEMS:  Review of Systems  Constitutional: Positive for appetite change (severely decreased) and fatigue (moderate).  Respiratory: Positive for cough (productive w/ white sputum) and shortness of breath. Negative for hemoptysis.   Gastrointestinal: Positive for diarrhea (occasional).  Neurological: Negative for numbness.  Psychiatric/Behavioral: Positive for sleep disturbance (d/t cough). The patient is nervous/anxious.   All other systems reviewed and are negative.   PAST MEDICAL/SURGICAL HISTORY:  Past Medical History:  Diagnosis Date  . Arthritis   . Hyperlipemia   . Hypertension   . Hyperthyroidism   . Myocardial infarction ([facility])   . Paroxysmal atrial fibrillation ([facility])   . Tachycardia    Past Surgical History:  Procedure Laterality Date  . CARDIAC CATHETERIZATION    . PORTACATH PLACEMENT Right 11/01/2019    Procedure: INSERTION PORT-A-CATH;  Surgeon: Aviva Signs, MD;  Location: [facility];  Service: General;  Laterality: Right;    SOCIAL HISTORY:  Social History   Socioeconomic History  . Marital status: Married    Spouse name: Not on file  . Number of children: 1  . Years of education: Not on file  . Highest education level: Not on file  Occupational History  . Occupation: retired  . Occupation: Glass blower/designer: [employer]  Tobacco Use  . Smoking status: Former Smoker    Years: 45.00    Types: 53, Cigarettes    Quit date: 10/19/2014    Years since quitting: 5.0  . Smokeless tobacco: Never Used  Vaping Use  . Vaping Use: Never used  Substance and Sexual Activity  . Alcohol use: Never  . Drug use: No  . Sexual activity: Not on file  Other Topics Concern  . Not on file  Social History Narrative  . Not on file   Social Determinants of Health   Financial Resource Strain: Medium Risk  . Difficulty of Paying Living Expenses: Somewhat hard  Food Insecurity: No Food Insecurity  . Worried About Charity fundraiser in the Last Year: Never true  . Ran Out of Food in the Last Year: Never true  Transportation Needs: No Transportation Needs  . Lack of Transportation (Medical): No  . Lack of Transportation (Non-Medical): No  Physical Activity: Inactive  . Days of Exercise per Week: 0 days  . Minutes of Exercise per Session: 0 min  Stress: Stress Concern Present  .  Feeling of Stress : Very much  Social Connections: Moderately Isolated  . Frequency of Communication with Friends and Family: More than three times a week  . Frequency of Social Gatherings with Friends and Family: More than three times a week  . Attends Religious Services: Never  . Active Member of Clubs or Organizations: No  . Attends Archivist Meetings: Never  . Marital Status: Married  Human resources officer Violence: Not At Risk  . Fear of Current or Ex-Partner: No  . Emotionally Abused: No  . Physically  Abused: No  . Sexually Abused: No    FAMILY HISTORY:  Family History  Problem Relation Age of Onset  . Hypertension Father   . Dementia Father        died age 64  . Heart disease Father   . Heart failure Other     CURRENT MEDICATIONS:  Current Outpatient Medications  Medication Sig Dispense Refill  . acetaminophen (TYLENOL) 500 MG tablet Take 500 mg by mouth every 6 (six) hours as needed for moderate pain or headache.    . albuterol (PROVENTIL) (2.5 MG/3ML) 0.083% nebulizer solution Take 3 mLs (2.5 mg total) by nebulization every 6 (six) hours as needed for wheezing or shortness of breath. 75 mL 12  . amoxicillin-clavulanate (AUGMENTIN) 875-125 MG tablet Take 1 tablet by mouth 2 (two) times daily. 20 tablet 0  . apixaban (ELIQUIS) 5 MG TABS tablet Take 1 tablet (5 mg total) by mouth 2 (two) times daily. 60 tablet 6  . HYDROcodone-homatropine (HYCODAN) 5-1.5 MG/5ML syrup Take 15 mLs by mouth every 8 (eight) hours as needed for cough. 480 mL 0  . ibuprofen (ADVIL) 200 MG tablet Take 200-400 mg by mouth every 6 (six) hours as needed for headache or moderate pain.     Marland Kitchen LORazepam (ATIVAN) 1 MG tablet Take 1 mg by mouth once.    . metoprolol tartrate (LOPRESSOR) 25 MG tablet Take 1.5 tablets (37.5 mg total) by mouth 2 (two) times daily. 270 tablet 2   No current facility-administered medications for this visit.    ALLERGIES:  No Known Allergies  PHYSICAL EXAM:  Performance status (ECOG): 1 - Symptomatic but completely ambulatory  Vitals:   11/08/19 1148  BP: 124/72  Pulse: 75  Resp: 18  Temp: (!) 96.9 F (36.1 C)  SpO2: 100%   Wt Readings from Last 3 Encounters:  11/08/19 171 lb 8 oz (77.8 kg)  11/04/19 177 lb (80.3 kg)  11/01/19 183 lb 15.9 oz (83.5 kg)   Physical Exam Vitals reviewed.  Constitutional:      Appearance: Normal appearance. He is obese.  Cardiovascular:     Rate and Rhythm: Normal rate and regular rhythm.     Pulses: Normal pulses.     Heart sounds:  Normal heart sounds.  Pulmonary:     Effort: Pulmonary effort is normal.     Breath sounds: Examination of the left-middle field reveals decreased breath sounds. Examination of the left-lower field reveals decreased breath sounds. Decreased breath sounds present.  Chest:     Comments: Port-a-Cath in R chest Neurological:     General: No focal deficit present.     Mental Status: He is alert and oriented to person, place, and time.  Psychiatric:        Mood and Affect: Mood normal.        Behavior: Behavior normal.      LABORATORY DATA:  I have reviewed the labs as listed.  CBC Latest Ref  Rng & Units 11/01/2019 10/31/2019 10/06/2019  WBC 4.0 - 10.5 K/uL 10.7(H) 9.9 12.2(H)  Hemoglobin 13.0 - 17.0 g/dL 13.4 14.1 15.6  Hematocrit 39 - 52 % 42.3 44.1 48.6  Platelets 150 - 400 K/uL 192 193 188   CMP Latest Ref Rng & Units 11/01/2019 10/06/2019 03/22/2012  Glucose 70 - 99 mg/dL 110(H) 111(H) 118(H)  BUN 8 - 23 mg/dL 14 18 32(H)  Creatinine 0.61 - 1.24 mg/dL 1.04 1.09 1.55(H)  Sodium 135 - 145 mmol/L 135 139 137  Potassium 3.5 - 5.1 mmol/L 4.1 4.1 4.1  Chloride 98 - 111 mmol/L 102 105 104  CO2 22 - 32 mmol/L 27 25 24   Calcium 8.9 - 10.3 mg/dL 8.9 9.2 9.7  Total Protein 6.5 - 8.1 g/dL - 8.3(H) 7.5  Total Bilirubin 0.3 - 1.2 mg/dL - 0.8 0.4  Alkaline Phos 38 - 126 U/L - 92 66  AST 15 - 41 U/L - 22 18  ALT 0 - 44 U/L - 22 20   Surgical pathology ([id number]) on 10/31/2019: Left upper lobe lung mass needle core biopsy: invasive moderately differentiated squamous cell carcinoma.  DIAGNOSTIC IMAGING:  I have independently reviewed the scans and discussed with the patient. MR Brain W Wo Contrast  Result Date: 10/28/2019 CLINICAL DATA:  Non-small cell lung cancer, staging. EXAM: MRI HEAD WITHOUT AND WITH CONTRAST TECHNIQUE: Multiplanar, multiecho pulse sequences of the brain and surrounding structures were obtained without and with intravenous contrast. CONTRAST:  40mL GADAVIST GADOBUTROL 1  MMOL/ML IV SOLN COMPARISON:  None. FINDINGS: Image quality is degraded by motion artifact. Brain: Scattered T2/FLAIR hyperintense foci involving the periventricular and subcortical white matter are nonspecific however commonly associated with chronic microvascular ischemic changes. Mild cerebral atrophy with ex vacuo dilatation. No acute infarct or intracranial hemorrhage. No midline shift, ventriculomegaly or extra-axial fluid collection. No mass lesion. No abnormal enhancement. Vascular: Normal flow voids. Skull and upper cervical spine: Normal marrow signal. Sinuses/Orbits: Normal orbits. Clear paranasal sinuses. No mastoid effusion. Other: None. IMPRESSION: No evidence of metastases. Mild cerebral atrophy.  Mild chronic microvascular ischemic changes. Electronically Signed   By: Primitivo Gauze M.D.   On: 10/28/2019 11:07   NM PET Image Initial (PI) Skull Base To Thigh  Result Date: 10/14/2019 CLINICAL DATA:  Initial treatment strategy for lung mass. EXAM: NUCLEAR MEDICINE PET SKULL BASE TO THIGH TECHNIQUE: 10.4 mCi F-18 FDG was injected intravenously. Full-ring PET imaging was performed from the skull base to thigh after the radiotracer. CT data was obtained and used for attenuation correction and anatomic localization. Fasting blood glucose: 130 mg/dl COMPARISON:  Chest CT 10/06/2019 FINDINGS: Mediastinal blood pool activity: SUV max 2.8 Liver activity: SUV max NA NECK: No hypermetabolic lymph nodes in the neck. Absent metabolic activity of the LEFT focal cord consistent with paralysis (related to recurrent laryngeal nerve impingement at the AP window by tumor) Incidental CT findings: none CHEST: Large LEFT suprahilar mass measuring approximately 4.5 x 5.7 cm intensely hypermetabolic SUV max equal 71.0. Lesion measures 7 cm in greatest dimension. Lesion does not appear to invade the chest wall. There is direct extension of hypermetabolic tissue into the AP window with equal metabolic activity.  Hypermetabolic subcarinal lymph node with SUV max equal 13.6. No hypermetabolic supraclavicular nodes. No discrete pulmonary nodules. Incidental CT findings: none ABDOMEN/PELVIS: No abnormal hypermetabolic activity within the liver, pancreas, adrenal glands, or spleen. No hypermetabolic lymph nodes in the abdomen or pelvis. Incidental CT findings: none SKELETON: No focal hypermetabolic activity to suggest skeletal  metastasis. Incidental CT findings: none IMPRESSION: 1. Large hypermetabolic LEFT upper lobe mass surrounding the LEFT hilum consistent bronchogenic carcinoma. Lesion extends to the pleural surface but does not invade the chest wall. 2. Direct invasion into the AP window and metastatic subcarinal lymph node. (PET/CT staging: T4 N2 M0 ) 3. No evidence of distant metastatic disease. 4. 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 Electronically Signed   By: Suzy Bouchard M.D.   On: 10/14/2019 14:38   CT Biopsy  Result Date: 10/31/2019 INDICATION: 69 year old male with a history of left upper lobe mass, FDG avid, likely primary lung carcinoma EXAM: CT BIOPSY MEDICATIONS: None. ANESTHESIA/SEDATION: Moderate (conscious) sedation was employed during this procedure. A total of Versed 1.0 mg and Fentanyl 50 mcg was administered intravenously. Moderate Sedation Time: 11 minutes. The patient's level of consciousness and vital signs were monitored continuously by radiology nursing throughout the procedure under my direct supervision. FLUOROSCOPY TIME:  CT COMPLICATIONS: None PROCEDURE: The procedure, risks, benefits, and alternatives were explained to the patient and the patient's family. Specific risks that were addressed included bleeding, infection, pneumothorax, need for further procedure including chest tube placement, chance of delayed pneumothorax or hemorrhage, hemoptysis, nondiagnostic sample, cardiopulmonary collapse, death. Questions regarding  the procedure were encouraged and answered. The patient understands and consents to the procedure. Patient was positioned in the supine position on the CT gantry table and a scout CT of the chest was performed for planning purposes. Once angle of approach was determined, the skin and subcutaneous tissues this scan was prepped and draped in the usual sterile fashion, and a sterile drape was applied covering the operative field. A sterile gown and sterile gloves were used for the procedure. Local anesthesia was provided with 1% Lidocaine. The skin and subcutaneous tissues were infiltrated 1% lidocaine for local anesthesia, and a small stab incision was made with an 11 blade scalpel. Using CT guidance, a 17 gauge trocar needle was advanced into the left upper lobetarget. After confirmation of the tip, separate 18 gauge core biopsies were performed. These were placed into solution for transportation to the lab. Biosentry Device was deployed. A final CT image was performed. Patient tolerated the procedure well and remained hemodynamically stable throughout. No complications were encountered and no significant blood loss was encounter IMPRESSION: Status post CT-guided biopsy of left upper lobe mass Signed, Dulcy Fanny. Dellia Nims, RPVI [facility] Rad[facility] Electronically Signed   By: Corrie Mckusick D.O.   On: 10/31/2019 13:08   DG Chest Port 1 View  Result Date: 11/01/2019 CLINICAL DATA:  Port-A-Cath placement EXAM: PORTABLE CHEST 1 VIEW COMPARISON:  10/31/2019 FINDINGS: Right subclavian Port-A-Cath has been placed with the tip in the SVC. No pneumothorax. Left upper lobe airspace disease again noted, unchanged. Right lung clear. IMPRESSION: Right subclavian Port-A-Cath tip in the SVC.  No pneumothorax. Electronically Signed   By: Rolm Baptise M.D.   On: 11/01/2019 10:11   DG Chest Port 1 View  Result Date: 10/31/2019 CLINICAL DATA:  Post left upper lobe lung mass  biopsy. EXAM: PORTABLE CHEST 1 VIEW COMPARISON:  Chest x-ray dated October 27, 2019. FINDINGS: Normal heart size. Continued complete collapse of the left upper lobe and fullness of the left hilum related to underlying large mass. The right lung is clear. No pleural effusion or pneumothorax. No acute osseous abnormality. IMPRESSION: 1. No pneumothorax status post left upper lobe lung mass biopsy. 2. Continued complete  left upper lobe collapse. Electronically Signed   By: Titus Dubin M.D.   On: 10/31/2019 14:34   DG Chest Portable 1 View  Result Date: 10/27/2019 CLINICAL DATA:  Shortness of breath EXAM: PORTABLE CHEST 1 VIEW COMPARISON:  10/06/2019.  Chest CT 10/06/2019. FINDINGS: Diffuse airspace disease throughout the left lung, worsening since prior study. This may be related to postobstructive atelectasis or pneumonia related to the large left upper lobe mass seen on prior study. Right lung clear. Heart is normal size. No acute bony abnormality. IMPRESSION: Worsening aeration throughout the left lung, likely related to worsening postobstructive atelectasis or pneumonia. Electronically Signed   By: Rolm Baptise M.D.   On: 10/27/2019 00:09   DG C-Arm 1-60 Min-No Report  Result Date: 11/01/2019 Fluoroscopy was utilized by the requesting physician.  No radiographic interpretation.     ASSESSMENT:  1.  Left lung cancer: -Presentation to the ER on 10/06/2019 with cough and dizziness. -CT chest with contrast on 10/06/2019 showed large left suprahilar mass completely obstructing left upper lobe bronchus, measuring 7 x 5.3 x 5.8 cm.  Mass extends into AP window and narrows the left mainstem bronchus.  Small subpleural nodule in the left lower lobe measuring 4 mm indeterminate.  AP window nodal mass measuring 3.4 cm.  No supraclavicular adenopathy.  Subcarinal enlarged lymph node measures 1.4 cm. -PET scan on 10/14/2019 shows large hypermetabolic left upper lobe mass surrounding the left hilum, extending  into pleural surface not invading the chest wall.  Direct invasion into the AP window and metastatic subcarinal lymph node, T4 N2 M0.  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 -10 pound weight loss in the last 6 months.  Hoarseness for last 1 week. -MRI of the brain on 10/28/2019 with no evidence of metastasis.  2.  Social/family history: -He smoked 1 pack/day for 45 years, quit 5 years ago. -No family history of malignancies.   PLAN:  1.  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  We have also reviewed MRI of the brain results. -Recommended concurrent chemoradiation therapy with weekly carboplatin and paclitaxel. -This will be followed by consolidation immunotherapy with durvalumab if he gets at least partial response. -We have discussed side effects in detail. -We will make a referral to radiation oncology.  He has already port placed. -Tentatively we will start him in the next 2 weeks.  2.  Coughing spells: -Continue Hycodan as needed which is helping.  3.  Anxiety: -We will start him on Xanax 0.25 mg twice daily as needed and titrated up.  4.  Atrial fibrillation: -He was started on Eliquis 5 mg twice daily by Dr. Harl Bowie.   Orders placed this encounter:  No orders of the defined types were placed in this encounter.    Derek Jack, MD [place] [phone]   I, Milinda Antis, am acting as a scribe for Dr. Sanda Linger.  I, Derek Jack MD, have reviewed the above documentation for accuracy and completeness, and I agree with the above.

## 2019-11-08 NOTE — Progress Notes (Signed)

## 2019-11-08 NOTE — Patient Instructions (Signed)
[name] at [facility] Discharge Instructions  You were seen today by Dr. Delton Coombes. He went over your recent results and scans: your biopsy showed squamous cell lung cancer in the left lung. You will be prescribed Xanax 0.25 mg to take for your anxiety. If you are not able to eat your regular meals, supplement your diet with 3-5 cans of Boost Plus or Ensure Plus to maintain your weight. You will be referred to radiation oncology for radiation treatments. Dr. Delton Coombes will see you back on the first day of chemo for labs and follow up.   Thank you for choosing [place] at [facility] to provide your oncology and hematology care.  To afford each patient quality time with our provider, please arrive at least 15 minutes before your scheduled appointment time.   If you have a lab appointment with the [facility] please come in thru the Main Entrance and check in at the main information desk  You need to re-schedule your appointment should you arrive 10 or more minutes late.  We strive to give you quality time with our providers, and arriving late affects you and other patients whose appointments are after yours.  Also, if you no show three or more times for appointments you may be dismissed from the clinic at the providers discretion.     Again, thank you for choosing [facility].  Our hope is that these requests will decrease the amount of time that you wait before being seen by our physicians.       _____________________________________________________________  Should you have questions after your visit to [facility], please contact our office at [phone] between the hours of 8:00 a.m. and 4:30 p.m.  Voicemails left after 4:00 p.m. will not be returned until the following business day.  For prescription refill requests, have your pharmacy contact our office and allow 72 hours.    Cancer Center Support Programs:    > Cancer Support Group  2nd Tuesday of the month 1pm-2pm, Journey Room

## 2019-11-11 ENCOUNTER — Other Ambulatory Visit: Payer: Self-pay

## 2019-11-11 ENCOUNTER — Other Ambulatory Visit (HOSPITAL_COMMUNITY): Payer: Self-pay

## 2019-11-11 ENCOUNTER — Ambulatory Visit (HOSPITAL_COMMUNITY)
Admission: RE | Admit: 2019-11-11 | Discharge: 2019-11-11 | Disposition: A | Discharge status: Home / Self Care | Payer: Medicare Other | Source: Ambulatory Visit | Attending: Cardiology | Admitting: Cardiology

## 2019-11-11 DIAGNOSIS — I4891 Unspecified atrial fibrillation: Secondary | ICD-10-CM | POA: Insufficient documentation

## 2019-11-11 LAB — ECHOCARDIOGRAM COMPLETE
AR max vel: 2.17 cm2
AV Area VTI: 2.1 cm2
AV Area mean vel: 2.02 cm2
AV Mean grad: 2 mmHg
AV Peak grad: 3.2 mmHg
Ao pk vel: 0.9 m/s
Area-P 1/2: 3.53 cm2
S' Lateral: 2.59 cm

## 2019-11-11 MED ORDER — HYDROCODONE-HOMATROPINE 5-1.5 MG/5ML PO SYRP
15.0000 mL | ORAL_SOLUTION | Freq: Three times a day (TID) | ORAL | 0 refills | Status: DC | PRN
Start: 2019-11-11 — End: 2019-11-28

## 2019-11-11 NOTE — Progress Notes (Signed)
*  PRELIMINARY RESULTS* Echocardiogram 2D Echocardiogram has been performed.  Alan [name] 11/11/2019, 3:03 PM

## 2019-11-14 ENCOUNTER — Encounter (HOSPITAL_COMMUNITY): Payer: Self-pay

## 2019-11-14 DIAGNOSIS — Z95828 Presence of other vascular implants and grafts: Secondary | ICD-10-CM

## 2019-11-14 HISTORY — DX: Presence of other vascular implants and grafts: Z95.828

## 2019-11-14 MED ORDER — LIDOCAINE-PRILOCAINE 2.5-2.5 % EX CREA: TOPICAL_CREAM | CUTANEOUS | 3 refills | Status: DC

## 2019-11-14 MED ORDER — PROCHLORPERAZINE MALEATE 10 MG PO TABS: 10.0000 mg | ORAL_TABLET | Freq: Four times a day (QID) | ORAL | 1 refills | Status: DC | PRN

## 2019-11-14 NOTE — Patient Instructions (Addendum)
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  You will be treated in the clinic weekly (one day a week) in conjunction with radiation therapy (five days a week).  The chemotherapy drugs you will receive in the clinic are paclitaxel (Taxol) and carboplatin.  The intent of treatment is cure.  You will see the doctor regularly throughout treatment.  We will obtain blood work from you prior to every treatment and monitor your results to make sure it is safe to give your treatment. The doctor monitors your response to treatment by the way you are feeling, your blood work, and by obtaining scans periodically.  There will be wait times while you are here for treatment.  It will take about 30 minutes to 1 hour for your lab work to result.  Then there will be wait times while pharmacy mixes your medications.    Medications you will receive in the clinic prior to your chemotherapy medications:  Aloxi:  ALOXI is used in adults to help prevent the nausea and vomiting that happens with certain chemotherapy drugs.  Aloxi is a long acting medication, and will remain in your system for about 2 days.   Dexamethasone:  This is a steroid given prior to chemotherapy to help prevent allergic reactions; it may also help prevent and control nausea and diarrhea.   Pepcid:  This medication is a histamine blocker that helps prevent and allergic reaction to your chemotherapy.   Benadryl:  This is a histamine blocker (different from the Pepcid) that helps prevent allergic/infusion reactions to your chemotherapy. This medication may cause dizziness/drowsiness.    Paclitaxel (Taxol)  About This Drug  Paclitaxel is a drug used to treat cancer. It is given in the vein (IV).  This will take ** hour to infuse.  This first infusion will take longer because it is increased slowly to monitor for reactions.  The nurse will be in the room with you for  the first 15 minutes of the first infusion.  Possible Side Effects  . Hair loss. Hair loss is often temporary, although with certain medicine, hair loss can sometimes be permanent. Hair loss may happen suddenly or gradually. If you lose hair, you may lose it from your head, face, armpits, pubic area, chest, and/or legs. You may also notice your hair getting thin.  . Swelling of your legs, ankles and/or feet (edema)  . Flushing  . Nausea and throwing up (vomiting)  . Loose bowel movements (diarrhea)  . Bone marrow depression. This is a decrease in the number of white blood cells, red blood cells, and platelets. This may raise your risk of infection, make you tired and weak (fatigue), and raise your risk of bleeding.  . Effects on the nerves are called peripheral neuropathy. You may feel numbness, tingling, or pain in your hands and feet. It may be hard for you to button your clothes, open jars, or walk as usual. The effect on the nerves may get worse with more doses of the drug. These effects get better in some people after the drug is stopped but it does not get better in all people.  . Changes in your liver function  . Bone, joint and muscle pain  . Abnormal EKG  . Allergic reaction: Allergic reactions, including anaphylaxis are rare but may happen in some patients. Signs of allergic reaction to this drug may be swelling of the face, feeling like  your tongue or throat are swelling, trouble breathing, rash, itching, fever, chills, feeling dizzy, and/or feeling that your heart is beating in a fast or not normal way. If this happens, do not take another dose of this drug. You should get urgent medical treatment.  . Infection  . Changes in your kidney function.  Note: Each of the side effects above was reported in 20% or greater of patients treated with paclitaxel. Not all possible side effects are included above.   Warnings and Precautions  . Severe allergic reactions  . Severe  bone marrow depression   Treating Side Effects  . To help with hair loss, wash with a mild shampoo and avoid washing your hair every day.  . Avoid rubbing your scalp, instead, pat your hair or scalp dry  . Avoid coloring your hair  . Limit your use of hair spray, electric curlers, blow dryers, and curling irons.  . If you are interested in getting a wig, talk to your nurse. You can also call the [employer] at 800-ACS-2345 to find out information about the "Look Good, Feel Better" program close to where you live. It is a free program where women getting chemotherapy can learn about wigs, turbans and scarves as well as makeup techniques and skin and nail care.  . Ask your doctor or nurse about medicines that are available to help stop or lessen diarrhea and/or nausea.  . To help with nausea and vomiting, eat small, frequent meals instead of three large meals a day. Choose foods and drinks that are at room temperature. Ask your nurse or doctor about other helpful tips and medicine that is available to help or stop lessen these symptoms.  . If you get diarrhea, eat low-fiber foods that are high in protein and calories and avoid foods that can irritate your digestive tracts or lead to cramping. Ask your nurse or doctor about medicine that can lessen or stop your diarrhea.  . Mouth care is very important. Your mouth care should consist of routine, gentle cleaning of your teeth or dentures and rinsing your mouth with a mixture of 1/2 teaspoon of salt in 8 ounces of water or  teaspoon of baking soda in 8 ounces of water. This should be done at least after each meal and at bedtime.  . If you have mouth sores, avoid mouthwash that has alcohol. Also avoid alcohol and smoking because they can bother your mouth and throat.  . Drink plenty of fluids (a minimum of eight glasses per day is recommended).  . Take your temperature as your doctor or nurse tells you, and whenever you feel like  you may have a fever.  . Talk to your doctor or nurse about precautions you can take to avoid infections and bleeding.  . Be careful when cooking, walking, and handling sharp objects and hot liquids.  Food and Drug Interactions  . There are no known interactions of paclitaxel with food.  . This drug may interact with other medicines. Tell your doctor and pharmacist about all the medicines and dietary supplements (vitamins, minerals, herbs and others) that you are taking at this time.  . The safety and use of dietary supplements and alternative diets are often not known. Using these might affect your cancer or interfere with your treatment. Until more is known, you should not use dietary supplements or alternative diets without your cancer doctor's help.  When to Call the Doctor  Call your doctor or nurse if you have  any of the following symptoms and/or any new or unusual symptoms:  . Fever of 100.4 F (38 C) or above  . Chills  . Redness, pain, warmth, or swelling at the IV site during the infusion  . Signs of allergic reaction: swelling of the face, feeling like your tongue or throat are swelling, trouble breathing, rash, itching, fever, chills, feeling dizzy, and/or feeling that your heart is beating in a fast or not normal way  . Feeling that your heart is beating in a fast or not normal way (palpitations)  . Weight gain of 5 pounds in one week (fluid retention)  . Decreased urine or very dark urine  . Signs of liver problems: dark urine, pale bowel movements, bad stomach pain, feeling very tired and weak, unusual  itching, or yellowing of the eyes or skin  . Heavy menstrual period that lasts longer than normal  . Easy bruising or bleeding  . Nausea that stops you from eating or drinking, and/or that is not relieved by prescribed medicines.  . Loose bowel movements (diarrhea) more than 4 times a day or diarrhea with weakness or lightheadedness  . Pain in your mouth or  throat that makes it hard to eat or drink  . Lasting loss of appetite or rapid weight loss of five pounds in a week  . Signs of peripheral neuropathy: numbness, tingling, or decreased feeling in fingers or toes; trouble walking or changes in the way you walk; or feeling clumsy when buttoning clothes, opening jars, or other routine activities  . Joint and muscle pain that is not relieved by prescribed medicines  . Extreme fatigue that interferes with normal activities  . While you are getting this drug, please tell your nurse right away if you have any pain, redness, or swelling at the site of the IV infusion.  . If you think you are pregnant.  Reproduction Warnings  . Pregnancy warning: This drug may have harmful effects on the unborn child, it is recommended that effective methods of birth control should be used during your cancer treatment. Let your doctor know right away if you think you may be pregnant.  . Breast feeding warning: Women should not breast feed during treatment because this drug could enter the breastmilk and cause harm to a breast feeding baby.   Carboplatin (Paraplatin, CBDCA)  About This Drug  Carboplatin is used to treat cancer. It is given in the vein through your port a cath.  It will take 30 minutes to infuse. You will receive this medication every 3 weeks.   Possible Side Effects  . Bone marrow suppression. This is a decrease in the number of white blood cells, red blood cells, and platelets. This may raise your risk of infection, make you tired and weak (fatigue), and raise your risk of bleeding.  . Nausea and vomiting (throwing up)  . Weakness  . Changes in your liver function  . Changes in your kidney function  . Electrolyte changes  . Pain  Note: Each of the side effects above was reported in 20% or greater of patients treated with carboplatin. Not all possible side effects are included above.   Warnings and Precautions  . Severe bone  marrow suppression  . Allergic reactions, including anaphylaxis are rare but may happen in some patients. Signs of allergic reaction to this drug may be swelling of the face, feeling like your tongue or throat are swelling, trouble breathing, rash, itching, fever, chills, feeling dizzy, and/or feeling that  your heart is beating in a fast or not normal way. If this happens, do not take another dose of this drug. You should get urgent medical treatment.  . Severe nausea and vomiting  . Effects on the nerves are called peripheral neuropathy. This risk is increased if you are over the age of 33 or if you have received other medicine with risk of peripheral neuropathy. You may feel numbness, tingling, or pain in your hands and feet. It may be hard for you to button your clothes, open jars, or walk as usual. The effect on the nerves may get worse with more doses of the drug. These effects get better in some people after the drug is stopped but it does not get better in all people.  Marland Kitchen Blurred vision, loss of vision or other changes in eyesight  . Decreased hearing  . Skin and tissue irritation including redness, pain, warmth, or swelling at the IV site if the drug leaks out of the vein and into nearby tissue.  . Severe changes in your kidney function, which can cause kidney failure  . Severe changes in your liver function, which can cause liver failure  Note: Some of the side effects above are very rare. If you have concerns and/or questions, please discuss them with your medical team.  Important Information  . This drug may be present in the saliva, tears, sweat, urine, stool, vomit, semen, and vaginal secretions. Talk to your doctor and/or your nurse about the necessary precautions to take during this time.  Treating Side Effects  . Manage tiredness by pacing your activities for the day.  . Be sure to include periods of rest between energy-draining activities.  . To decrease the risk of  infection, wash your hands regularly.  . Avoid close contact with people who have a cold, the flu, or other infections.  . Take your temperature as your doctor or nurse tells you, and whenever you feel like you may have a fever.  . To help decrease the risk of bleeding, use a soft toothbrush. Check with your nurse before using dental floss.  . Be very careful when using knives or tools.  . Use an electric shaver instead of a razor.  . Drink plenty of fluids (a minimum of eight glasses per day is recommended).  . If you throw up or have loose bowel movements, you should drink more fluids so that you do not become dehydrated (lack of water in the body from losing too much fluid).  . To help with nausea and vomiting, eat small, frequent meals instead of three large meals a day.  Choose foods and drinks that are at room temperature. Ask your nurse or doctor about other helpful tips and medicine that is available to help stop or lessen these symptoms.  . If you have numbness and tingling in your hands and feet, be careful when cooking, walking, and handling sharp objects and hot liquids.  Marland Kitchen Keeping your pain under control is important to your well-being. Please tell your doctor or nurse if you are experiencing pain.  Food and Drug Interactions  . There are no known interactions of carboplatin with food.  . This drug may interact with other medicines. Tell your doctor and pharmacist about all the prescription and over-the-counter medicines and dietary supplements (vitamins, minerals, herbs and others) that you are taking at this time. Also, check with your doctor or pharmacist before starting any new prescription or over-the-counter medicines, or dietary supplements to  make sure that there are no interactions.  When to Call the Doctor  Call your doctor or nurse if you have any of these symptoms and/or any new or unusual symptoms:  . Fever of 100.4 F (38 C) or higher  . Chills  .  Tiredness that interferes with your daily activities  . Feeling dizzy or lightheaded  . Easy bleeding or bruising  . Nausea that stops you from eating or drinking and/or is not relieved by prescribed medicines  . Throwing up/vomiting  . Blurred vision or other changes in eyesight  . Decrease in hearing or ringing in the ear  . Signs of allergic reaction: swelling of the face, feeling like your tongue or throat are swelling, trouble breathing, rash, itching, fever, chills, feeling dizzy, and/or feeling that your heart is beating in a fast or not normal way. If this happens, call 911 for emergency care.  . While you are getting this drug, please tell your nurse right away if you have any pain, redness, or swelling at the site of the IV infusion  . Signs of possible liver problems: dark urine, pale bowel movements, bad stomach pain, feeling very tired and weak, unusual itching, or yellowing of the eyes or skin  . Decreased urine, or very dark urine  . Numbness, tingling, or pain in your hands and feet  . Pain that does not go away or is not relieved by prescribed medicine  . If you think you may be pregnant  Reproduction Warnings  . Pregnancy warning: This drug may have harmful effects on the unborn baby. Women of child bearing potential should use effective methods of birth control during your cancer treatment. Let your doctor know right away if you think you may be pregnant.  . Breastfeeding warning: It is not known if this drug passes into breast milk. For this reason, women should not breastfeed during treatment because this drug could enter the breast milk and cause harm to a breastfeeding baby.  . Fertility warning: Human fertility studies have not been done with this drug. Talk with your doctor or nurse if you plan to have children. Ask for information on sperm or egg banking.  SELF CARE ACTIVITIES WHILE RECEIVING CHEMOTHERAPY:  Hydration Increase your fluid intake 48 hours  prior to treatment and drink at least 8 to 12 cups (64 ounces) of water/decaffeinated beverages per day after treatment. You can still have your cup of coffee or soda but these beverages do not count as part of your 8 to 12 cups that you need to drink daily. No alcohol intake.  Medications Continue taking your normal prescription medication as prescribed.  If you start any new herbal or new supplements please let us know first to make sure it is safe.  Mouth Care Have teeth cleaned professionally before starting treatment. Keep dentures and partial plates clean. Use soft toothbrush and do not use mouthwashes that contain alcohol. Biotene is a good mouthwash that is available at most pharmacies or may be ordered by calling [phone]. Use warm salt water gargles (1 teaspoon salt per 1 quart warm water) before and after meals and at bedtime. If you need dental work, please let the doctor know before you go for your appointment so that we can coordinate the best possible time for you in regards to your chemo regimen. You need to also let your dentist know that you are actively taking chemo. We may need to do labs prior to your dental appointment.  Skin Care Always use sunscreen that has not expired and with SPF (Sun Protection Factor) of 50 or higher. Wear hats to protect your head from the sun. Remember to use sunscreen on your hands, ears, face, & feet.  Use good moisturizing lotions such as udder cream, eucerin, or even Vaseline. Some chemotherapies can cause dry skin, color changes in your skin and nails.    . Avoid long, hot showers or baths. . Use gentle, fragrance-free soaps and laundry detergent. . Use moisturizers, preferably creams or ointments rather than lotions because the thicker consistency is better at preventing skin dehydration. Apply the cream or ointment within 15 minutes of showering. Reapply moisturizer at night, and moisturize your hands every time after you wash them.  Hair  Loss (if your doctor says your hair will fall out)  . If your doctor says that your hair is likely to fall out, decide before you begin chemo whether you want to wear a wig. You may want to shop before treatment to match your hair color. . Hats, turbans, and scarves can also camouflage hair loss, although some people prefer to leave their heads uncovered. If you go bare-headed outdoors, be sure to use sunscreen on your scalp. . Cut your hair short. It eases the inconvenience of shedding lots of hair, but it also can reduce the emotional impact of watching your hair fall out. . Don't perm or color your hair during chemotherapy. Those chemical treatments are already damaging to hair and can enhance hair loss. Once your chemo treatments are done and your hair has grown back, it's OK to resume dyeing or perming hair.  With chemotherapy, hair loss is almost always temporary. But when it grows back, it may be a different color or texture. In older adults who still had hair color before chemotherapy, the new growth may be completely gray.  Often, new hair is very fine and soft.  Infection Prevention Please wash your hands for at least 30 seconds using warm soapy water. Handwashing is the #1 way to prevent the spread of germs. Stay away from sick people or people who are getting over a cold. If you develop respiratory systems such as green/yellow mucus production or productive cough or persistent cough let us know and we will see if you need an antibiotic. It is a good idea to keep a pair of gloves on when going into grocery stores/[employer] to decrease your risk of coming into contact with germs on the carts, etc. Carry alcohol hand gel with you at all times and use it frequently if out in public. If your temperature reaches 100.4 or higher please call the clinic and let us know.  If it is after hours or on the weekend please go to the ER if your temperature is over 100.4.  Please have your own personal thermometer  at home to use.    Sex and bodily fluids If you are going to have sex, a condom must be used to protect the person that isn't taking chemotherapy. Chemo can decrease your libido (sex drive). For a few days after chemotherapy, chemotherapy can be excreted through your bodily fluids.  When using the toilet please close the lid and flush the toilet twice.  Do this for a few day after you have had chemotherapy.   Effects of chemotherapy on your sex life Some changes are simple and won't last long. They won't affect your sex life permanently.  Sometimes you may feel: . too tired . not  strong enough to be very active . sick or sore  . not in the mood . anxious or low  Your anxiety might not seem related to sex. For example, you may be worried about the cancer and how your treatment is going. Or you may be worried about money, or about how you family are coping with your illness.  These things can cause stress, which can affect your interest in sex. It's important to talk to your partner about how you feel.  Remember - the changes to your sex life don't usually last long. There's usually no medical reason to stop having sex during chemo. The drugs won't have any long term physical effects on your performance or enjoyment of sex. Cancer can't be passed on to your partner during sex  Contraception It's important to use reliable contraception during treatment. Avoid getting pregnant while you or your partner are having chemotherapy. This is because the drugs may harm the baby. Sometimes chemotherapy drugs can leave a man or woman infertile.  This means you would not be able to have children in the future. You might want to talk to someone about permanent infertility. It can be very difficult to learn that you may no longer be able to have children. Some people find counselling helpful. There might be ways to preserve your fertility, although this is easier for men than for women. You may want to speak to a  fertility expert. You can talk about sperm banking or harvesting your eggs. You can also ask about other fertility options, such as donor eggs. If you have or have had breast cancer, your doctor might advise you not to take the contraceptive pill. This is because the hormones in it might affect the cancer. It is not known for sure whether or not chemotherapy drugs can be passed on through semen or secretions from the vagina. Because of this some doctors advise people to use a barrier method if you have sex during treatment. This applies to vaginal, anal or oral sex. Generally, doctors advise a barrier method only for the time you are actually having the treatment and for about a week after your treatment. Advice like this can be worrying, but this does not mean that you have to avoid being intimate with your partner. You can still have close contact with your partner and continue to enjoy sex.  Animals If you have cats or birds we just ask that you not change the litter or change the cage.  Please have someone else do this for you while you are on chemotherapy.   Food Safety During and After Cancer Treatment Food safety is important for people both during and after cancer treatment. Cancer and cancer treatments, such as chemotherapy, radiation therapy, and stem cell/bone marrow transplantation, often weaken the immune system. This makes it harder for your body to protect itself from foodborne illness, also called food poisoning. Foodborne illness is caused by eating food that contains harmful bacteria, parasites, or viruses.  Foods to avoid Some foods have a higher risk of becoming tainted with bacteria. These include: Marland Kitchen Unwashed fresh fruit and vegetables, especially leafy vegetables that can hide dirt and other contaminants . Raw sprouts, such as alfalfa sprouts . Raw or undercooked beef, especially ground beef, or other raw or undercooked meat and poultry . Fatty, fried, or spicy foods immediately  before or after treatment.  These can sit heavy on your stomach and make you feel nauseous. . Raw or undercooked shellfish, such  as oysters. . Sushi and sashimi, which often contain raw fish.  . Unpasteurized beverages, such as unpasteurized fruit juices, raw milk, raw yogurt, or cider . Undercooked eggs, such as soft boiled, over easy, and poached; raw, unpasteurized eggs; or foods made with raw egg, such as homemade raw cookie dough and homemade mayonnaise  Simple steps for food safety  Shop smart. . Do not buy food stored or displayed in an unclean area. . Do not buy bruised or damaged fruits or vegetables. . Do not buy cans that have cracks, dents, or bulges. . Pick up foods that can spoil at the end of your shopping trip and store them in a cooler on the way home.  Prepare and clean up foods carefully. . Rinse all fresh fruits and vegetables under running water, and dry them with a clean towel or paper towel. . Clean the top of cans before opening them. . After preparing food, wash your hands for 20 seconds with hot water and soap. Pay special attention to areas between fingers and under nails. . Clean your utensils and dishes with hot water and soap. Marland Kitchen Disinfect your kitchen and cutting boards using 1 teaspoon of liquid, unscented bleach mixed into 1 quart of water.    Dispose of old food. . Eat canned and packaged food before its expiration date (the "use by" or "best before" date). . Consume refrigerated leftovers within 3 to 4 days. After that time, throw out the food. Even if the food does not smell or look spoiled, it still may be unsafe. Some bacteria, such as Listeria, can grow even on foods stored in the refrigerator if they are kept for too long.  Take precautions when eating out. . At restaurants, avoid buffets and salad bars where food sits out for a long time and comes in contact with many people. Food can become contaminated when someone with a virus, often a norovirus,  or another "bug" handles it. . Put any leftover food in a "to-go" container yourself, rather than having the server do it. And, refrigerate leftovers as soon as you get home. . Choose restaurants that are clean and that are willing to prepare your food as you order it cooked.   AT HOME MEDICATIONS:                                                                                                                                                                Compazine/Prochlorperazine 10mg  tablet. Take 1 tablet every 6 hours as needed for nausea/vomiting. (This can make you sleepy)   EMLA cream. Apply a quarter size amount to port site 1 hour prior to chemo. Do not rub in. Cover with plastic wrap.    Diarrhea Sheet   If you are having loose  stools/diarrhea, please purchase Imodium and begin taking as outlined:  At the first sign of poorly formed or loose stools you should begin taking Imodium (loperamide) 2 mg capsules.  Take two tablets (4mg ) followed by one tablet (2mg ) every 2 hours - DO NOT EXCEED 8 tablets in 24 hours.  If it is bedtime and you are having loose stools, take 2 tablets at bedtime, then 2 tablets every 4 hours until morning.   Always call the [facility] if you are having loose stools/diarrhea that you can't get under control.  Loose stools/diarrhea leads to dehydration (loss of water) in your body.  We have other options of trying to get the loose stools/diarrhea to stop but you must let us know!   Constipation Sheet  Colace - 100 mg capsules - take 2 capsules daily.  If this doesn't help then you can increase to 2 capsules twice daily.  Please call if the above does not work for you. Do not go more than 2 days without a bowel movement.  It is very important that you do not become constipated.  It will make you feel sick to your stomach (nausea) and can cause abdominal pain and vomiting.  Nausea Sheet   Compazine/Prochlorperazine 10mg  tablet. Take 1 tablet every 6 hours  as needed for nausea/vomiting (This can make you drowsy).  If you are having persistent nausea (nausea that does not stop) please call the [facility] and let us know the amount of nausea that you are experiencing.  If you begin to vomit, you need to call the [facility] and if it is the weekend and you have vomited more than one time and can't get it to stop-go to the Emergency Room.  Persistent nausea/vomiting can lead to dehydration (loss of fluid in your body) and will make you feel very weak and unwell. Ice chips, sips of clear liquids, foods that are at room temperature, crackers, and toast tend to be better tolerated.   SYMPTOMS TO REPORT AS SOON AS POSSIBLE AFTER TREATMENT:  FEVER GREATER THAN 100.4 F  CHILLS WITH OR WITHOUT FEVER  NAUSEA AND VOMITING THAT IS NOT CONTROLLED WITH YOUR NAUSEA MEDICATION  UNUSUAL SHORTNESS OF BREATH  UNUSUAL BRUISING OR BLEEDING  TENDERNESS IN MOUTH AND THROAT WITH OR WITHOUT PRESENCE OF ULCERS  URINARY PROBLEMS  BOWEL PROBLEMS  UNUSUAL RASH     Wear comfortable clothing and clothing appropriate for easy access to any Portacath or PICC line. Let us know if there is anything that we can do to make your therapy better!   What to do if you need assistance after hours or on the weekends: CALL [phone].  HOLD on the line, do not hang up.  You will hear multiple messages but at the end you will be connected with a nurse triage line.  They will contact the doctor if necessary.  Most of the time they will be able to assist you.  Do not call the hospital operator.     I have been informed and understand all of the instructions given to me and have received a copy. I have been instructed to call the clinic [phone] or my family physician as soon as possible for continued medical care, if indicated. I do not have any more questions at this time but understand that I may call the [facility] or the Patient Navigator at [phone] during  office hours should I have questions or need assistance in obtaining follow-up care.

## 2019-11-15 ENCOUNTER — Inpatient Hospital Stay (HOSPITAL_COMMUNITY): Payer: Medicare Other

## 2019-11-15 ENCOUNTER — Encounter (HOSPITAL_COMMUNITY): Payer: Self-pay

## 2019-11-15 ENCOUNTER — Ambulatory Visit (HOSPITAL_COMMUNITY): Payer: Medicare Other | Admitting: General Practice

## 2019-11-15 DIAGNOSIS — C3492 Malignant neoplasm of unspecified part of left bronchus or lung: Secondary | ICD-10-CM

## 2019-11-15 DIAGNOSIS — Z95828 Presence of other vascular implants and grafts: Secondary | ICD-10-CM

## 2019-11-15 NOTE — Progress Notes (Signed)
[facility] Initial Psychosocial Assessment Clinical Social Work  Clinical Social Work contacted by phone to assess psychosocial, emotional, mental health, and spiritual needs of the patient.   Barriers to care/review of distress screen:  - Transportation:  Do you anticipate any problems getting to appointments?  Do you have someone who can help run errands for you if you need it?  No issues w transportation, daughter has taken FMLA so she can help w rides. Affording gas is an issue - Help at home:  What is your living situation (alone, family, other)?  If you are physically unable to care for yourself, who would you call on to help you?  Lives w wife and her son/daughter in law, two children (3 and 85) live there also.  Wife also babysits great grandson who is 71 months old.  Many people in the home can help him physically.   - Support system:  What does your support system look like?  Who would you call on if you needed some kind of practical help?  What if you needed someone to talk to for emotional support?  Good support from children, grandchildren.  - Finances:  Are you concerned about finances.  Considering returning to work?  If not, applying for disability?  Some financial strain, "surprise bills" cause family to have to "pinch" the budget.  What is your understanding of where you are with your cancer? Its cause?  Your treatment plan and what happens next?  Got a "really bad cough that hung on and on."  Went to ER for care, thought it was bronchitis/pneumonia, found mass in left lung, oncology follow was arranged.  "That's how we got started on this whole thing."  No PCP. "We don't go to the doctor unless we really have to."  Will do 6 - 8 weeks of concurrent chemoradiation tx [facility]).  Will be followed up by immunotherapy.  What are your worries for the future as you begin treatment for cancer?  "Hes just scared about what's happening, also the pain."  "Im worried because he is."   Appreciates the help of Xanax which helps w sleep.    What are your hopes and priorities during your treatment? What is important to you? What are your goals for your care?  Likes to be with grandchildren, taking him to school/band practice.  Likes to work w band.  "Can't do that for a while" due to COVID concerns.  Would like to be able to go to football games but was told that he should avoid crowds.  Is not planning to take COVID vaccination, will wear mask and social distance.     CSW Summary:  Patient and family psychosocial functioning including strengths, limitations, and coping skills:  69 year old married male, lives w wife and her daughter/husband/grandchildren.  Newly diagnosed w Stage 3 lung cancer.  Retired, enjoys being w grandchildren and attending band events w them.  No PCP, does not like to seek medical care unless its absolutely necessary.  Aware activities should be limited due to COVID precautions, he does not choose to be vaccinated but will mask/social distance.  Will start treatment soon, has been very anxious since cancer diagnosis and, per wife, had great difficulty sleeping.  Anti anxiety medications have helped w sleep.  Has good support from family.  Identifications of barriers to care:  Financial stress  Availability of community resources:  [employer] gas card program, [place]  Clinical Social Worker follow up needed: No.   Edwyna Shell, [facility] Social Worker Phone:  [phone]

## 2019-11-18 ENCOUNTER — Other Ambulatory Visit: Payer: Self-pay

## 2019-11-18 ENCOUNTER — Inpatient Hospital Stay (HOSPITAL_COMMUNITY): Payer: Medicare Other | Attending: Hematology

## 2019-11-18 DIAGNOSIS — Z7901 Long term (current) use of anticoagulants: Secondary | ICD-10-CM | POA: Insufficient documentation

## 2019-11-18 DIAGNOSIS — C3412 Malignant neoplasm of upper lobe, left bronchus or lung: Secondary | ICD-10-CM | POA: Insufficient documentation

## 2019-11-18 DIAGNOSIS — F419 Anxiety disorder, unspecified: Secondary | ICD-10-CM | POA: Insufficient documentation

## 2019-11-18 DIAGNOSIS — I10 Essential (primary) hypertension: Secondary | ICD-10-CM | POA: Insufficient documentation

## 2019-11-18 DIAGNOSIS — Z5111 Encounter for antineoplastic chemotherapy: Secondary | ICD-10-CM | POA: Insufficient documentation

## 2019-11-18 DIAGNOSIS — I480 Paroxysmal atrial fibrillation: Secondary | ICD-10-CM | POA: Insufficient documentation

## 2019-11-18 NOTE — Progress Notes (Signed)

## 2019-11-18 NOTE — Progress Notes (Signed)
See documentation on 11/15/19 encounter.

## 2019-11-23 ENCOUNTER — Telehealth: Payer: Self-pay | Admitting: *Deleted

## 2019-11-23 NOTE — Telephone Encounter (Signed)
-----   Message from Arnoldo Lenis, MD sent at 11/23/2019  3:52 PM EDT ----- Normal echo  Zandra Abts MD

## 2019-11-23 NOTE — Telephone Encounter (Signed)
LM to return call.

## 2019-11-24 NOTE — Telephone Encounter (Signed)
Pt aware.

## 2019-11-28 ENCOUNTER — Inpatient Hospital Stay (HOSPITAL_COMMUNITY): Payer: Medicare Other

## 2019-11-28 ENCOUNTER — Encounter (HOSPITAL_COMMUNITY): Payer: Self-pay | Admitting: Hematology

## 2019-11-28 ENCOUNTER — Other Ambulatory Visit: Payer: Self-pay

## 2019-11-28 ENCOUNTER — Other Ambulatory Visit (HOSPITAL_COMMUNITY): Payer: Self-pay | Admitting: *Deleted

## 2019-11-28 ENCOUNTER — Other Ambulatory Visit (HOSPITAL_COMMUNITY): Payer: Self-pay

## 2019-11-28 ENCOUNTER — Inpatient Hospital Stay (HOSPITAL_BASED_OUTPATIENT_CLINIC_OR_DEPARTMENT_OTHER): Payer: Medicare Other | Admitting: Hematology

## 2019-11-28 VITALS — BP 125/68 | HR 75 | Temp 96.9°F | Resp 17

## 2019-11-28 VITALS — BP 125/63 | HR 90 | Temp 96.8°F | Resp 18 | Wt 174.8 lb

## 2019-11-28 DIAGNOSIS — C3492 Malignant neoplasm of unspecified part of left bronchus or lung: Secondary | ICD-10-CM

## 2019-11-28 DIAGNOSIS — Z5111 Encounter for antineoplastic chemotherapy: Secondary | ICD-10-CM | POA: Diagnosis present

## 2019-11-28 DIAGNOSIS — Z95828 Presence of other vascular implants and grafts: Secondary | ICD-10-CM

## 2019-11-28 DIAGNOSIS — Z7901 Long term (current) use of anticoagulants: Secondary | ICD-10-CM | POA: Diagnosis not present

## 2019-11-28 DIAGNOSIS — I10 Essential (primary) hypertension: Secondary | ICD-10-CM | POA: Diagnosis not present

## 2019-11-28 DIAGNOSIS — F419 Anxiety disorder, unspecified: Secondary | ICD-10-CM | POA: Diagnosis not present

## 2019-11-28 DIAGNOSIS — I480 Paroxysmal atrial fibrillation: Secondary | ICD-10-CM | POA: Diagnosis not present

## 2019-11-28 DIAGNOSIS — C3412 Malignant neoplasm of upper lobe, left bronchus or lung: Secondary | ICD-10-CM | POA: Diagnosis present

## 2019-11-28 LAB — CBC WITH DIFFERENTIAL/PLATELET
Abs Immature Granulocytes: 0.05 10*3/uL (ref 0.00–0.07)
Basophils Absolute: 0.1 10*3/uL (ref 0.0–0.1)
Basophils Relative: 1 %
Eosinophils Absolute: 0.2 10*3/uL (ref 0.0–0.5)
Eosinophils Relative: 1 %
HCT: 43 % (ref 39.0–52.0)
Hemoglobin: 13.7 g/dL (ref 13.0–17.0)
Immature Granulocytes: 0 %
Lymphocytes Relative: 11 %
Lymphs Abs: 1.4 10*3/uL (ref 0.7–4.0)
MCH: 28.1 pg (ref 26.0–34.0)
MCHC: 31.9 g/dL (ref 30.0–36.0)
MCV: 88.3 fL (ref 80.0–100.0)
Monocytes Absolute: 0.7 10*3/uL (ref 0.1–1.0)
Monocytes Relative: 6 %
Neutro Abs: 10.5 10*3/uL — ABNORMAL HIGH (ref 1.7–7.7)
Neutrophils Relative %: 81 %
Platelets: 172 10*3/uL (ref 150–400)
RBC: 4.87 MIL/uL (ref 4.22–5.81)
RDW: 13.7 % (ref 11.5–15.5)
WBC: 12.9 10*3/uL — ABNORMAL HIGH (ref 4.0–10.5)
nRBC: 0 % (ref 0.0–0.2)

## 2019-11-28 LAB — COMPREHENSIVE METABOLIC PANEL
ALT: 18 U/L (ref 0–44)
AST: 19 U/L (ref 15–41)
Albumin: 3.4 g/dL — ABNORMAL LOW (ref 3.5–5.0)
Alkaline Phosphatase: 70 U/L (ref 38–126)
Anion gap: 9 (ref 5–15)
BUN: 18 mg/dL (ref 8–23)
CO2: 27 mmol/L (ref 22–32)
Calcium: 9.3 mg/dL (ref 8.9–10.3)
Chloride: 100 mmol/L (ref 98–111)
Creatinine, Ser: 1.2 mg/dL (ref 0.61–1.24)
GFR, Estimated: >60 mL/min (ref >60)
Glucose, Bld: 117 mg/dL — ABNORMAL HIGH (ref 70–99)
Potassium: 4.2 mmol/L (ref 3.5–5.1)
Sodium: 136 mmol/L (ref 135–145)
Total Bilirubin: 0.6 mg/dL (ref 0.3–1.2)
Total Protein: 7.5 g/dL (ref 6.5–8.1)

## 2019-11-28 MED ORDER — SODIUM CHLORIDE 0.9 % IV SOLN: Freq: Once | INTRAVENOUS | Status: AC

## 2019-11-28 MED ORDER — SODIUM CHLORIDE 0.9% FLUSH
10.0000 mL | INTRAVENOUS | Status: DC | PRN
  Administered 2019-11-28: 10 mL

## 2019-11-28 MED ORDER — SODIUM CHLORIDE 0.9 % IV SOLN
45.0000 mg/m2 | Freq: Once | INTRAVENOUS | Status: AC
  Administered 2019-11-28: 84 mg via INTRAVENOUS
  Filled 2019-11-28: qty 14

## 2019-11-28 MED ORDER — SODIUM CHLORIDE 0.9 % IV SOLN
20.0000 mg | Freq: Once | INTRAVENOUS | Status: AC
  Administered 2019-11-28: 20 mg via INTRAVENOUS
  Filled 2019-11-28: qty 20

## 2019-11-28 MED ORDER — FAMOTIDINE IN NACL 20-0.9 MG/50ML-% IV SOLN
20.0000 mg | Freq: Once | INTRAVENOUS | Status: AC
  Administered 2019-11-28: 20 mg via INTRAVENOUS

## 2019-11-28 MED ORDER — PALONOSETRON HCL INJECTION 0.25 MG/5ML
INTRAVENOUS | Status: AC
  Filled 2019-11-28: qty 5

## 2019-11-28 MED ORDER — DIPHENHYDRAMINE HCL 50 MG/ML IJ SOLN
INTRAMUSCULAR | Status: AC
  Filled 2019-11-28: qty 1

## 2019-11-28 MED ORDER — DIPHENHYDRAMINE HCL 50 MG/ML IJ SOLN
50.0000 mg | Freq: Once | INTRAMUSCULAR | Status: AC
  Administered 2019-11-28: 50 mg via INTRAVENOUS

## 2019-11-28 MED ORDER — SODIUM CHLORIDE 0.9 % IV SOLN
176.0000 mg | Freq: Once | INTRAVENOUS | Status: AC
  Administered 2019-11-28: 180 mg via INTRAVENOUS
  Filled 2019-11-28: qty 18

## 2019-11-28 MED ORDER — PALONOSETRON HCL INJECTION 0.25 MG/5ML
0.2500 mg | Freq: Once | INTRAVENOUS | Status: AC
  Administered 2019-11-28: 0.25 mg via INTRAVENOUS

## 2019-11-28 MED ORDER — HYDROCODONE-HOMATROPINE 5-1.5 MG/5ML PO SYRP
15.0000 mL | ORAL_SOLUTION | Freq: Three times a day (TID) | ORAL | 0 refills | Status: DC | PRN
Start: 2019-11-28 — End: 2019-12-19

## 2019-11-28 MED ORDER — HYDROCODONE-HOMATROPINE 5-1.5 MG/5ML PO SYRP
15.0000 mL | ORAL_SOLUTION | Freq: Three times a day (TID) | ORAL | 0 refills | Status: DC | PRN
Start: 2019-11-28 — End: 2019-11-28

## 2019-11-28 MED ORDER — HEPARIN SOD (PORK) LOCK FLUSH 100 UNIT/ML IV SOLN
500.0000 [IU] | Freq: Once | INTRAVENOUS | Status: AC | PRN
  Administered 2019-11-28: 500 [IU]

## 2019-11-28 MED ORDER — FAMOTIDINE IN NACL 20-0.9 MG/50ML-% IV SOLN
INTRAVENOUS | Status: AC
  Filled 2019-11-28: qty 50

## 2019-11-28 NOTE — Patient Instructions (Signed)
[facility] Discharge Instructions  Labs drawn from portacath today   Thank you for choosing [facility] to provide your oncology and hematology care.  To afford each patient quality time with our provider, please arrive at least 15 minutes before your scheduled appointment time.   If you have a lab appointment with the Cancer Center please come in thru the Main Entrance and check in at the main information desk.  You need to re-schedule your appointment should you arrive 10 or more minutes late.  We strive to give you quality time with our providers, and arriving late affects you and other patients whose appointments are after yours.  Also, if you no show three or more times for appointments you may be dismissed from the clinic at the providers discretion.     Again, thank you for choosing [facility].  Our hope is that these requests will decrease the amount of time that you wait before being seen by our physicians.       _____________________________________________________________  Should you have questions after your visit to [facility], please contact our office at [phone] and follow the prompts.  Our office hours are 8:00 a.m. and 4:30 p.m. Monday - Friday.  Please note that voicemails left after 4:00 p.m. may not be returned until the following business day.  We are closed weekends and major holidays.  You do have access to a nurse 24-7, just call the main number to the clinic [phone] and do not press any options, hold on the line and a nurse will answer the phone.    For prescription refill requests, have your pharmacy contact our office and allow 72 hours.    Due to Covid, you will need to wear a mask upon entering the hospital. If you do not have a mask, a mask will be given to you at the Main Entrance upon arrival. For doctor visits, patients may have 1 support person age 18  or older with them. For treatment visits, patients can not have anyone with them due to social distancing guidelines and our immunocompromised population.     

## 2019-11-28 NOTE — Progress Notes (Signed)
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  PRIOR THERAPY: None  NGS Results: Not done  CURRENT THERAPY: Carboplatin and paclitaxel every 3 weeks  BRIEF ONCOLOGIC HISTORY:  Oncology History  Squamous cell lung cancer, left ([place])  11/08/2019 Initial Diagnosis   Squamous cell lung cancer, left ([place])   11/08/2019 Cancer Staging   Staging form: Lung, AJCC 8th Edition - Clinical stage from 11/08/2019: Stage IIIB (cT4, cN2, cM0) - Signed by Derek Jack, MD on 11/08/2019   11/28/2019 -  Chemotherapy   The patient had palonosetron (ALOXI) injection 0.25 mg, 0.25 mg, Intravenous,  Once, 0 of 4 cycles CARBOplatin (PARAPLATIN) in sodium chloride 0.9 % 100 mL chemo infusion, , Intravenous,  Once, 0 of 4 cycles PACLitaxel (TAXOL) 84 mg in sodium chloride 0.9 % 250 mL chemo infusion (</= 80mg /m2), 45 mg/m2, Intravenous,  Once, 0 of 4 cycles  for chemotherapy treatment.      CANCER STAGING: Cancer Staging Squamous cell lung cancer, left [facility]) Staging form: Lung, AJCC 8th Edition - Clinical stage from 11/08/2019: Stage IIIB (cT4, cN2, cM0) - Signed by Derek Jack, MD on 11/08/2019   INTERVAL HISTORY:  Mr. DAVINDER HAFF, a 69 y.o. male, returns for routine follow-up and consideration for first cycle of chemotherapy. Usbaldo was last seen on 11/08/2019.  Due for initiating cycle #1 of carboplatin and paclitaxel today.   Today he is accompanied by his wife. Overall, he tells me he has been feeling pretty well. He had his first radiation therapy today; he already vomited once on his way after radiation. He is tolerating his Eliquis and denies having nosebleeds or hematochezia. His cough is dry and improving; he reports that the Hycodan is helping with his cough. He denies numbness or tingling. His appetite is at 25% and  reports getting tired from chewing food; he has not started drinking Boost or Ensure yet.  Overall, he feels ready for first cycle of chemo today.    REVIEW OF SYSTEMS:  Review of Systems  Constitutional: Positive for appetite change (25%) and fatigue (50%).  HENT:   Negative for nosebleeds.   Respiratory: Positive for cough (dry; improving).   Gastrointestinal: Positive for nausea and vomiting. Negative for blood in stool.  Neurological: Negative for numbness.  All other systems reviewed and are negative.   PAST MEDICAL/SURGICAL HISTORY:  Past Medical History:  Diagnosis Date  . Arthritis   . Hyperlipemia   . Hypertension   . Hyperthyroidism   . Myocardial infarction ([place])   . Paroxysmal atrial fibrillation ([facility])   . Port-A-Cath in place 11/14/2019  . Tachycardia    Past Surgical History:  Procedure Laterality Date  . CARDIAC CATHETERIZATION    . PORTACATH PLACEMENT Right 11/01/2019   Procedure: INSERTION PORT-A-CATH;  Surgeon: Aviva Signs, MD;  Location: [facility];  Service: General;  Laterality: Right;    SOCIAL HISTORY:  Social History   Socioeconomic History  . Marital status: Married    Spouse name: Not on file  . Number of children: 1  . Years of education: Not on file  . Highest education level: Not on file  Occupational History  . Occupation: retired  . Occupation: Glass blower/designer: [employer]  Tobacco Use  . Smoking status: Former Smoker    Years: 45.00    Types:  Cigars, Cigarettes    Quit date: 10/19/2014    Years since quitting: 5.1  . Smokeless tobacco: Never Used  Vaping Use  . Vaping Use: Never used  Substance and Sexual Activity  . Alcohol use: Never  . Drug use: No  . Sexual activity: Not on file  Other Topics Concern  . Not on file  Social History Narrative  . Not on file   Social Determinants of Health   Financial Resource Strain: Medium Risk  . Difficulty of Paying Living Expenses: Somewhat hard  Food Insecurity: No Food Insecurity   . Worried About Charity fundraiser in the Last Year: Never true  . Ran Out of Food in the Last Year: Never true  Transportation Needs: No Transportation Needs  . Lack of Transportation (Medical): No  . Lack of Transportation (Non-Medical): No  Physical Activity: Inactive  . Days of Exercise per Week: 0 days  . Minutes of Exercise per Session: 0 min  Stress: Stress Concern Present  . Feeling of Stress : Very much  Social Connections: Moderately Isolated  . Frequency of Communication with Friends and Family: More than three times a week  . Frequency of Social Gatherings with Friends and Family: More than three times a week  . Attends Religious Services: Never  . Active Member of Clubs or Organizations: No  . Attends Archivist Meetings: Never  . Marital Status: Married  Human resources officer Violence: Not At Risk  . Fear of Current or Ex-Partner: No  . Emotionally Abused: No  . Physically Abused: No  . Sexually Abused: No    FAMILY HISTORY:  Family History  Problem Relation Age of Onset  . Hypertension Father   . Dementia Father        died age 4  . Heart disease Father   . Heart failure Other     CURRENT MEDICATIONS:  Current Outpatient Medications  Medication Sig Dispense Refill  . acetaminophen (TYLENOL) 500 MG tablet Take 500 mg by mouth every 6 (six) hours as needed for moderate pain or headache.    . albuterol (PROVENTIL) (2.5 MG/3ML) 0.083% nebulizer solution Take 3 mLs (2.5 mg total) by nebulization every 6 (six) hours as needed for wheezing or shortness of breath. 75 mL 12  . ALPRAZolam (XANAX) 0.25 MG tablet Take 1 tablet (0.25 mg total) by mouth 3 (three) times daily as needed for anxiety. 30 tablet 0  . amoxicillin-clavulanate (AUGMENTIN) 875-125 MG tablet Take 1 tablet by mouth 2 (two) times daily. 20 tablet 0  . apixaban (ELIQUIS) 5 MG TABS tablet Take 1 tablet (5 mg total) by mouth 2 (two) times daily. 60 tablet 6  . CARBOPLATIN IV Inject into the vein  once a week.    Marland Kitchen HYDROcodone-homatropine (HYCODAN) 5-1.5 MG/5ML syrup Take 15 mLs by mouth every 8 (eight) hours as needed for cough. 480 mL 0  . ibuprofen (ADVIL) 200 MG tablet Take 200-400 mg by mouth every 6 (six) hours as needed for headache or moderate pain.     Marland Kitchen lidocaine-prilocaine (EMLA) cream Apply a small amount to port a cath site and cover with plastic wrap 1 hour prior to chemotherapy appointments 30 g 3  . metoprolol tartrate (LOPRESSOR) 25 MG tablet Take 1.5 tablets (37.5 mg total) by mouth 2 (two) times daily. 270 tablet 2  . PACLitaxel (TAXOL IV) Inject into the vein once a week.    . prochlorperazine (COMPAZINE) 10 MG tablet Take 1 tablet (10 mg total) by  mouth every 6 (six) hours as needed (Nausea or vomiting). 30 tablet 1   No current facility-administered medications for this visit.    ALLERGIES:  No Known Allergies  PHYSICAL EXAM:  Performance status (ECOG): 1 - Symptomatic but completely ambulatory  Vitals:   11/28/19 0913  BP: 125/63  Pulse: 90  Resp: 18  Temp: (!) 96.8 F (36 C)  SpO2: 98%   Wt Readings from Last 3 Encounters:  11/28/19 174 lb 12.8 oz (79.3 kg)  11/08/19 171 lb 8 oz (77.8 kg)  11/04/19 177 lb (80.3 kg)   Physical Exam Vitals reviewed.  Constitutional:      Appearance: Normal appearance. He is obese.  Cardiovascular:     Rate and Rhythm: Normal rate and regular rhythm.     Pulses: Normal pulses.     Heart sounds: Normal heart sounds.  Pulmonary:     Effort: Pulmonary effort is normal.     Breath sounds: Normal breath sounds.  Chest:     Comments: Port-a-Cath in R chest Abdominal:     Hernia: A hernia is present. Hernia is present in the ventral area.  Neurological:     General: No focal deficit present.     Mental Status: He is alert and oriented to person, place, and time.  Psychiatric:        Mood and Affect: Mood normal.        Behavior: Behavior normal.     LABORATORY DATA:  I have reviewed the labs as listed.    CBC Latest Ref Rng & Units 11/28/2019 11/01/2019 10/31/2019  WBC 4.0 - 10.5 K/uL 12.9(H) 10.7(H) 9.9  Hemoglobin 13.0 - 17.0 g/dL 13.7 13.4 14.1  Hematocrit 39 - 52 % 43.0 42.3 44.1  Platelets 150 - 400 K/uL 172 192 193   CMP Latest Ref Rng & Units 11/28/2019 11/01/2019 10/06/2019  Glucose 70 - 99 mg/dL 117(H) 110(H) 111(H)  BUN 8 - 23 mg/dL 18 14 18   Creatinine 0.61 - 1.24 mg/dL 1.20 1.04 1.09  Sodium 135 - 145 mmol/L 136 135 139  Potassium 3.5 - 5.1 mmol/L 4.2 4.1 4.1  Chloride 98 - 111 mmol/L 100 102 105  CO2 22 - 32 mmol/L 27 27 25   Calcium 8.9 - 10.3 mg/dL 9.3 8.9 9.2  Total Protein 6.5 - 8.1 g/dL 7.5 - 8.3(H)  Total Bilirubin 0.3 - 1.2 mg/dL 0.6 - 0.8  Alkaline Phos 38 - 126 U/L 70 - 92  AST 15 - 41 U/L 19 - 22  ALT 0 - 44 U/L 18 - 22    DIAGNOSTIC IMAGING:  I have independently reviewed the scans and discussed with the patient. CT Biopsy  Result Date: 10/31/2019 INDICATION: 69 year old male with a history of left upper lobe mass, FDG avid, likely primary lung carcinoma EXAM: CT BIOPSY MEDICATIONS: None. ANESTHESIA/SEDATION: Moderate (conscious) sedation was employed during this procedure. A total of Versed 1.0 mg and Fentanyl 50 mcg was administered intravenously. Moderate Sedation Time: 11 minutes. The patient's level of consciousness and vital signs were monitored continuously by radiology nursing throughout the procedure under my direct supervision. FLUOROSCOPY TIME:  CT COMPLICATIONS: None PROCEDURE: The procedure, risks, benefits, and alternatives were explained to the patient and the patient's family. Specific risks that were addressed included bleeding, infection, pneumothorax, need for further procedure including chest tube placement, chance of delayed pneumothorax or hemorrhage, hemoptysis, nondiagnostic sample, cardiopulmonary collapse, death. Questions regarding the procedure were encouraged and answered. The patient understands and consents to the procedure. Patient  was  positioned in the supine position on the CT gantry table and a scout CT of the chest was performed for planning purposes. Once angle of approach was determined, the skin and subcutaneous tissues this scan was prepped and draped in the usual sterile fashion, and a sterile drape was applied covering the operative field. A sterile gown and sterile gloves were used for the procedure. Local anesthesia was provided with 1% Lidocaine. The skin and subcutaneous tissues were infiltrated 1% lidocaine for local anesthesia, and a small stab incision was made with an 11 blade scalpel. Using CT guidance, a 17 gauge trocar needle was advanced into the left upper lobetarget. After confirmation of the tip, separate 18 gauge core biopsies were performed. These were placed into solution for transportation to the lab. Biosentry Device was deployed. A final CT image was performed. Patient tolerated the procedure well and remained hemodynamically stable throughout. No complications were encountered and no significant blood loss was encounter IMPRESSION: Status post CT-guided biopsy of left upper lobe mass Signed, Dulcy Fanny. Dellia Nims, RPVI [facility] Electronically Signed   By: Corrie Mckusick D.O.   On: 10/31/2019 13:08   DG Chest Port 1 View  Result Date: 11/01/2019 CLINICAL DATA:  Port-A-Cath placement EXAM: PORTABLE CHEST 1 VIEW COMPARISON:  10/31/2019 FINDINGS: Right subclavian Port-A-Cath has been placed with the tip in the SVC. No pneumothorax. Left upper lobe airspace disease again noted, unchanged. Right lung clear. IMPRESSION: Right subclavian Port-A-Cath tip in the SVC.  No pneumothorax. Electronically Signed   By: Rolm Baptise M.D.   On: 11/01/2019 10:11   DG Chest Port 1 View  Result Date: 10/31/2019 CLINICAL DATA:  Post left upper lobe lung mass biopsy. EXAM: PORTABLE CHEST 1 VIEW COMPARISON:  Chest x-ray dated October 27, 2019. FINDINGS: Normal heart size.  Continued complete collapse of the left upper lobe and fullness of the left hilum related to underlying large mass. The right lung is clear. No pleural effusion or pneumothorax. No acute osseous abnormality. IMPRESSION: 1. No pneumothorax status post left upper lobe lung mass biopsy. 2. Continued complete left upper lobe collapse. Electronically Signed   By: Titus Dubin M.D.   On: 10/31/2019 14:34   DG C-Arm 1-60 Min-No Report  Result Date: 11/01/2019 Fluoroscopy was utilized by the requesting physician.  No radiographic interpretation.   ECHOCARDIOGRAM COMPLETE  Result Date: 11/11/2019    ECHOCARDIOGRAM REPORT   Patient Name:   JONMICHAEL BEADNELL Date of Exam: 11/11/2019 Medical Rec #:  [id number]      Height:       63.0 in Accession #:    [id number]     Weight:       171.5 lb Date of Birth:  Jun 14, 1950      BSA:          1.811 m Patient Age:    69 years       BP:           122/73 mmHg Patient Gender: M              HR:           87 bpm. Exam Location:  [facility] Procedure: 2D Echo Indications:    Atrial Fibrillation 427.31 / I48.91  History:        Patient has no prior history of Echocardiogram examinations.                 Previous Myocardial Infarction, Arrythmias:Atrial  Fibrillation;                 Risk Factors:Former Smoker, Dyslipidemia and Hypertension.  Sonographer:    Leavy Cella RDCS (AE) Referring Phys: [id number] [place]  1. Left ventricular ejection fraction, by estimation, is 65 to 70%. The left ventricle has normal function. The left ventricle has no regional wall motion abnormalities. There is moderate left ventricular hypertrophy. Left ventricular diastolic parameters are indeterminate.  2. Right ventricular systolic function is normal. The right ventricular size is normal.  3. A small pericardial effusion is present. The pericardial effusion is circumferential. There is no evidence of cardiac tamponade.  4. The mitral valve is normal in structure. Trivial mitral  valve regurgitation. No evidence of mitral stenosis.  5. The aortic valve has an indeterminant number of cusps. Aortic valve regurgitation is not visualized. No aortic stenosis is present.  6. The inferior vena cava is normal in size with greater than 50% respiratory variability, suggesting right atrial pressure of 3 mmHg. FINDINGS  Left Ventricle: Left ventricular ejection fraction, by estimation, is 65 to 70%. The left ventricle has normal function. The left ventricle has no regional wall motion abnormalities. The left ventricular internal cavity size was normal in size. There is  moderate left ventricular hypertrophy. Left ventricular diastolic parameters are indeterminate. Right Ventricle: The right ventricular size is normal. No increase in right ventricular wall thickness. Right ventricular systolic function is normal. Left Atrium: Left atrial size was normal in size. Right Atrium: Right atrial size was normal in size. Pericardium: A small pericardial effusion is present. The pericardial effusion is circumferential. There is no evidence of cardiac tamponade. Mitral Valve: The mitral valve is normal in structure. Trivial mitral valve regurgitation. No evidence of mitral valve stenosis. Tricuspid Valve: The tricuspid valve is normal in structure. Tricuspid valve regurgitation is not demonstrated. No evidence of tricuspid stenosis. Aortic Valve: The aortic valve has an indeterminant number of cusps. Aortic valve regurgitation is not visualized. No aortic stenosis is present. Aortic valve mean gradient measures 2.0 mmHg. Aortic valve peak gradient measures 3.2 mmHg. Aortic valve area, by VTI measures 2.10 cm. Pulmonic Valve: The pulmonic valve was not well visualized. Pulmonic valve regurgitation is not visualized. No evidence of pulmonic stenosis. Aorta: The aortic root is normal in size and structure. Pulmonary Artery: Indeterminant PASP, inadequate TR jet. Venous: The inferior vena cava is normal in size with  greater than 50% respiratory variability, suggesting right atrial pressure of 3 mmHg. IAS/Shunts: No atrial level shunt detected by color flow Doppler.  LEFT VENTRICLE PLAX 2D LVIDd:         4.25 cm  Diastology LVIDs:         2.59 cm  LV e' medial:    7.04 cm/s LV PW:         1.44 cm  LV E/e' medial:  14.3 LV IVS:        1.30 cm  LV e' lateral:   7.70 cm/s LVOT diam:     1.80 cm  LV E/e' lateral: 13.1 LV SV:         39 LV SV Index:   21 LVOT Area:     2.54 cm  RIGHT VENTRICLE RV S prime:     10.40 cm/s TAPSE (M-mode): 1.8 cm LEFT ATRIUM             Index       RIGHT ATRIUM  Index LA diam:        3.90 cm 2.15 cm/m  RA Area:     13.30 cm LA Vol (A2C):   42.6 ml 23.52 ml/m RA Volume:   32.40 ml  17.89 ml/m LA Vol (A4C):   54.2 ml 29.92 ml/m LA Biplane Vol: 48.2 ml 26.61 ml/m  AORTIC VALVE AV Area (Vmax):    2.17 cm AV Area (Vmean):   2.02 cm AV Area (VTI):     2.10 cm AV Vmax:           89.98 cm/s AV Vmean:          66.890 cm/s AV VTI:            0.184 m AV Peak Grad:      3.2 mmHg AV Mean Grad:      2.0 mmHg LVOT Vmax:         76.69 cm/s LVOT Vmean:        53.169 cm/s LVOT VTI:          0.152 m LVOT/AV VTI ratio: 0.83  AORTA Ao Root diam: 3.20 cm MITRAL VALVE MV Area (PHT): 3.53 cm     SHUNTS MV Decel Time: 215 msec     Systemic VTI:  0.15 m MV E velocity: 101.00 cm/s  Systemic Diam: 1.80 cm Carlyle Dolly MD Electronically signed by Carlyle Dolly MD Signature Date/Time: 11/11/2019/3:34:11 PM    Final      ASSESSMENT:  1. Left lung cancer: -Presentation to the ER on 10/06/2019 with cough and dizziness. -CT chest with contrast on 10/06/2019 showed large left suprahilar mass completely obstructing left upper lobe bronchus, measuring 7 x 5.3 x 5.8 cm. Mass extends into AP window and narrows the left mainstem bronchus. Small subpleural nodule in the left lower lobe measuring 4 mm indeterminate. AP window nodal mass measuring 3.4 cm. No supraclavicular adenopathy. Subcarinal enlarged lymph  node measures 1.4 cm. -PET scan on 10/14/2019 shows large hypermetabolic left upper lobe mass surrounding the left hilum, extending into pleural surface not invading the chest wall. Direct invasion into the AP window and metastatic subcarinal lymph node, T4 N2 M0. 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 -10 pound weight loss in the last 6 months. Hoarseness for last 1 week. -MRI of the brain on 10/28/2019 with no evidence of metastasis.  2. Social/family history: -He smoked 1 pack/day for 45 years, quit 5 years ago. -No family history of malignancies.   PLAN:  1.  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 -We discussed concurrent chemotherapy with carboplatin and paclitaxel administered weekly. -We discussed the side effects in detail. -Reviewed labs from today which showed albumin 3.4 otherwise normal LFTs.  Creatinine 1.2.  CBC was normal. -He will proceed with his 1st cycle today.  Reevaluate in 1 week.  2. Coughing spells: -Refill for Hycodan to be taken twice daily as needed given.  3.  Anxiety: -Continue Xanax 0.25 mg twice daily as needed.  4.  Atrial fibrillation: -Continue Eliquis 5 mg twice daily managed by Dr. Harl Bowie.  5.  Nutrition: -He complains of decreased appetite.  Weight is stable. -Recommended drinking 1 can of boost per day.   Orders placed this encounter:  No orders of the defined types were placed in this encounter.    Derek Jack, MD [name] [phone]   I, Milinda Antis, am  acting as a scribe for Dr. Sanda Linger.  I, Derek Jack MD, have reviewed the above documentation for accuracy and completeness, and I agree with the above.

## 2019-11-28 NOTE — Patient Instructions (Signed)
[facility] Discharge Instructions for Patients Receiving Chemotherapy  Today you received the following chemotherapy agents   To help prevent nausea and vomiting after your treatment, we encourage you to take your nausea medication   If you develop nausea and vomiting that is not controlled by your nausea medication, call the clinic.   BELOW ARE SYMPTOMS THAT SHOULD BE REPORTED IMMEDIATELY:  *FEVER GREATER THAN 100.5 F  *CHILLS WITH OR WITHOUT FEVER  NAUSEA AND VOMITING THAT IS NOT CONTROLLED WITH YOUR NAUSEA MEDICATION  *UNUSUAL SHORTNESS OF BREATH  *UNUSUAL BRUISING OR BLEEDING  TENDERNESS IN MOUTH AND THROAT WITH OR WITHOUT PRESENCE OF ULCERS  *URINARY PROBLEMS  *BOWEL PROBLEMS  UNUSUAL RASH Items with * indicate a potential emergency and should be followed up as soon as possible.  Feel free to call the clinic should you have any questions or concerns. The clinic phone number is [phone].  Please show the CHEMO ALERT CARD at check-in to the Emergency Department and triage nurse.   

## 2019-11-28 NOTE — Patient Instructions (Addendum)
[place] at [facility] Discharge Instructions  You were seen today by Dr. Delton Coombes. He went over your recent results. You initiated your chemotherapy treatment today. Drink plenty of fluids and keep eating 3 meals per day to maintain your weight; drink 1 can of Boost or Ensure daily to supplement your meals. You will be prescribed more Hycodan to help with your cough. Dr. Delton Coombes will see you back in 1 week for labs and follow up.   Thank you for choosing [place] at [facility] to provide your oncology and hematology care.  To afford each patient quality time with our provider, please arrive at least 15 minutes before your scheduled appointment time.   If you have a lab appointment with the [facility] please come in thru the Main Entrance and check in at the main information desk  You need to re-schedule your appointment should you arrive 10 or more minutes late.  We strive to give you quality time with our providers, and arriving late affects you and other patients whose appointments are after yours.  Also, if you no show three or more times for appointments you may be dismissed from the clinic at the providers discretion.     Again, thank you for choosing [facility].  Our hope is that these requests will decrease the amount of time that you wait before being seen by our physicians.       _____________________________________________________________  Should you have questions after your visit to [facility], please contact our office at [phone] between the hours of 8:00 a.m. and 4:30 p.m.  Voicemails left after 4:00 p.m. will not be returned until the following business day.  For prescription refill requests, have your pharmacy contact our office and allow 72 hours.    Cancer Center Support Programs:   > Cancer Support Group  2nd Tuesday of the month 1pm-2pm, Journey Room

## 2019-11-28 NOTE — Progress Notes (Signed)
Patient presents today for D1, C1 Tacol/ Carbo. Labs pending. Vital signs within parameters for treatment. Patient denies any pain today. Patient's wife at the bedside. Patient vomited x 1 on the way here from Radiation Therapy per wife's words. Patient denies any changes since his last visit with Dr. Delton Coombes.   Message received from Dr. Delton Coombes. Proceed with treatment today.    Treatment given today per MD orders. Tolerated infusion without adverse affects. Vital signs stable. No complaints at this time. Discharged from clinic ambulatory in stable condition. Alert and oriented x 3. F/U with [facility] as scheduled.

## 2019-11-29 ENCOUNTER — Other Ambulatory Visit (HOSPITAL_COMMUNITY): Payer: Self-pay | Admitting: *Deleted

## 2019-11-29 DIAGNOSIS — C3492 Malignant neoplasm of unspecified part of left bronchus or lung: Secondary | ICD-10-CM

## 2019-11-29 NOTE — Progress Notes (Signed)
24 hour call back. No answer on the home phone or patient's cell phone. Message and phone number left on both phones .

## 2019-11-29 NOTE — Progress Notes (Signed)
Orders placed for treatment labs

## 2019-12-02 ENCOUNTER — Telehealth (HOSPITAL_COMMUNITY): Payer: Self-pay | Admitting: Hematology

## 2019-12-05 ENCOUNTER — Inpatient Hospital Stay (HOSPITAL_COMMUNITY): Payer: Medicare Other

## 2019-12-05 ENCOUNTER — Other Ambulatory Visit: Payer: Self-pay

## 2019-12-05 ENCOUNTER — Encounter (HOSPITAL_COMMUNITY): Payer: Self-pay | Admitting: Hematology

## 2019-12-05 ENCOUNTER — Inpatient Hospital Stay (HOSPITAL_BASED_OUTPATIENT_CLINIC_OR_DEPARTMENT_OTHER): Payer: Medicare Other | Admitting: Hematology

## 2019-12-05 VITALS — BP 118/66 | HR 52 | Temp 97.0°F | Resp 18 | Wt 176.2 lb

## 2019-12-05 VITALS — BP 114/62 | HR 90 | Temp 97.2°F | Resp 18

## 2019-12-05 DIAGNOSIS — Z95828 Presence of other vascular implants and grafts: Secondary | ICD-10-CM

## 2019-12-05 DIAGNOSIS — C3492 Malignant neoplasm of unspecified part of left bronchus or lung: Secondary | ICD-10-CM

## 2019-12-05 DIAGNOSIS — Z5111 Encounter for antineoplastic chemotherapy: Secondary | ICD-10-CM | POA: Diagnosis not present

## 2019-12-05 LAB — COMPREHENSIVE METABOLIC PANEL
ALT: 21 U/L (ref 0–44)
AST: 20 U/L (ref 15–41)
Albumin: 3.1 g/dL — ABNORMAL LOW (ref 3.5–5.0)
Alkaline Phosphatase: 67 U/L (ref 38–126)
Anion gap: 10 (ref 5–15)
BUN: 20 mg/dL (ref 8–23)
CO2: 28 mmol/L (ref 22–32)
Calcium: 9.1 mg/dL (ref 8.9–10.3)
Chloride: 99 mmol/L (ref 98–111)
Creatinine, Ser: 0.95 mg/dL (ref 0.61–1.24)
GFR, Estimated: >60 mL/min (ref >60)
Glucose, Bld: 164 mg/dL — ABNORMAL HIGH (ref 70–99)
Potassium: 4.3 mmol/L (ref 3.5–5.1)
Sodium: 137 mmol/L (ref 135–145)
Total Bilirubin: 0.6 mg/dL (ref 0.3–1.2)
Total Protein: 6.7 g/dL (ref 6.5–8.1)

## 2019-12-05 LAB — CBC WITH DIFFERENTIAL/PLATELET
Abs Immature Granulocytes: 0.07 10*3/uL (ref 0.00–0.07)
Basophils Absolute: 0.1 10*3/uL (ref 0.0–0.1)
Basophils Relative: 1 %
Eosinophils Absolute: 0.1 10*3/uL (ref 0.0–0.5)
Eosinophils Relative: 1 %
HCT: 37.5 % — ABNORMAL LOW (ref 39.0–52.0)
Hemoglobin: 12.3 g/dL — ABNORMAL LOW (ref 13.0–17.0)
Immature Granulocytes: 1 %
Lymphocytes Relative: 7 %
Lymphs Abs: 0.7 10*3/uL (ref 0.7–4.0)
MCH: 28.6 pg (ref 26.0–34.0)
MCHC: 32.8 g/dL (ref 30.0–36.0)
MCV: 87.2 fL (ref 80.0–100.0)
Monocytes Absolute: 0.4 10*3/uL (ref 0.1–1.0)
Monocytes Relative: 4 %
Neutro Abs: 8 10*3/uL — ABNORMAL HIGH (ref 1.7–7.7)
Neutrophils Relative %: 86 %
Platelets: 182 10*3/uL (ref 150–400)
RBC: 4.3 MIL/uL (ref 4.22–5.81)
RDW: 13.4 % (ref 11.5–15.5)
WBC: 9.3 10*3/uL (ref 4.0–10.5)
nRBC: 0 % (ref 0.0–0.2)

## 2019-12-05 MED ORDER — SODIUM CHLORIDE 0.9 % IV SOLN: Freq: Once | INTRAVENOUS | Status: AC

## 2019-12-05 MED ORDER — DIPHENHYDRAMINE HCL 50 MG/ML IJ SOLN
50.0000 mg | Freq: Once | INTRAMUSCULAR | Status: AC
  Administered 2019-12-05: 50 mg via INTRAVENOUS
  Filled 2019-12-05: qty 1

## 2019-12-05 MED ORDER — SODIUM CHLORIDE 0.9 % IV SOLN
45.0000 mg/m2 | Freq: Once | INTRAVENOUS | Status: AC
  Administered 2019-12-05: 84 mg via INTRAVENOUS
  Filled 2019-12-05: qty 14

## 2019-12-05 MED ORDER — HEPARIN SOD (PORK) LOCK FLUSH 100 UNIT/ML IV SOLN
500.0000 [IU] | Freq: Once | INTRAVENOUS | Status: AC | PRN
  Administered 2019-12-05: 500 [IU]

## 2019-12-05 MED ORDER — SODIUM CHLORIDE 0.9 % IV SOLN
201.2000 mg | Freq: Once | INTRAVENOUS | Status: AC
  Administered 2019-12-05: 200 mg via INTRAVENOUS
  Filled 2019-12-05: qty 20

## 2019-12-05 MED ORDER — FAMOTIDINE IN NACL 20-0.9 MG/50ML-% IV SOLN
20.0000 mg | Freq: Once | INTRAVENOUS | Status: AC
  Administered 2019-12-05: 20 mg via INTRAVENOUS
  Filled 2019-12-05: qty 50

## 2019-12-05 MED ORDER — SODIUM CHLORIDE 0.9 % IV SOLN
20.0000 mg | Freq: Once | INTRAVENOUS | Status: AC
  Administered 2019-12-05: 20 mg via INTRAVENOUS
  Filled 2019-12-05: qty 20

## 2019-12-05 MED ORDER — SODIUM CHLORIDE 0.9% FLUSH
10.0000 mL | INTRAVENOUS | Status: DC | PRN
  Administered 2019-12-05: 10 mL

## 2019-12-05 MED ORDER — PALONOSETRON HCL INJECTION 0.25 MG/5ML
0.2500 mg | Freq: Once | INTRAVENOUS | Status: AC
  Administered 2019-12-05: 0.25 mg via INTRAVENOUS
  Filled 2019-12-05: qty 5

## 2019-12-05 NOTE — Progress Notes (Signed)
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  PRIOR THERAPY: None  NGS Results: Not done  CURRENT THERAPY: Carboplatin and paclitaxel weekly  BRIEF ONCOLOGIC HISTORY:  Oncology History  Squamous cell lung cancer, left (HCC)  11/08/2019 Initial Diagnosis   Squamous cell lung cancer, left ([place])   11/08/2019 Cancer Staging   Staging form: Lung, AJCC 8th Edition - Clinical stage from 11/08/2019: Stage IIIB (cT4, cN2, cM0) - Signed by Derek Jack, MD on 11/08/2019   11/28/2019 -  Chemotherapy   The patient had palonosetron (ALOXI) injection 0.25 mg, 0.25 mg, Intravenous,  Once, 1 of 4 cycles Administration: 0.25 mg (11/28/2019) CARBOplatin (PARAPLATIN) 180 mg in sodium chloride 0.9 % 250 mL chemo infusion, 180 mg (100 % of original dose 176 mg), Intravenous,  Once, 1 of 4 cycles Dose modification:   (original dose 176 mg, Cycle 1),   (Cycle 2) Administration: 180 mg (11/28/2019) PACLitaxel (TAXOL) 84 mg in sodium chloride 0.9 % 250 mL chemo infusion (</= 80mg /m2), 45 mg/m2 = 84 mg, Intravenous,  Once, 1 of 4 cycles Administration: 84 mg (11/28/2019)  for chemotherapy treatment.      CANCER STAGING: Cancer Staging Squamous cell lung cancer, left [facility]) Staging form: Lung, AJCC 8th Edition - Clinical stage from 11/08/2019: Stage IIIB (cT4, cN2, cM0) - Signed by Derek Jack, MD on 11/08/2019   INTERVAL HISTORY:  [name], a 69 y.o. male, returns for routine follow-up and consideration for next cycle of chemotherapy. Edrian was last seen on 11/28/2019.  Due for cycle #2 of carboplatin and paclitaxel today.   Today he is accompanied by his wife. Overall, he tells me he has been feeling pretty well. He tolerated the previous treatment well; he is handling the radiation well. He denies having any issues  swallowing and continues taking Hycodan for his cough. He drinks 1-2 cans of Boost daily.  Overall, he feels ready for next cycle of chemo today.    REVIEW OF SYSTEMS:  Review of Systems  Constitutional: Positive for appetite change (50%) and fatigue (50%).  HENT:   Negative for trouble swallowing.   Respiratory: Positive for cough (clear sputum).   All other systems reviewed and are negative.   PAST MEDICAL/SURGICAL HISTORY:  Past Medical History:  Diagnosis Date  . Arthritis   . Hyperlipemia   . Hypertension   . Hyperthyroidism   . Myocardial infarction ([place])   . Paroxysmal atrial fibrillation (HCC)   . Port-A-Cath in place 11/14/2019  . Tachycardia    Past Surgical History:  Procedure Laterality Date  . CARDIAC CATHETERIZATION    . PORTACATH PLACEMENT Right 11/01/2019   Procedure: INSERTION PORT-A-CATH;  Surgeon: Aviva Signs, MD;  Location: [facility];  Service: General;  Laterality: Right;    SOCIAL HISTORY:  Social History   Socioeconomic History  . Marital status: Married    Spouse name: Not on file  . Number of children: 1  . Years of education: Not on file  . Highest education level: Not on file  Occupational History  . Occupation: retired  . Occupation: Glass blower/designer: [employer]  Tobacco Use  . Smoking status: Former Smoker    Years: 45.00    Types: 14, Cigarettes    Quit date: 10/19/2014    Years since quitting: 5.1  .  Smokeless tobacco: Never Used  Vaping Use  . Vaping Use: Never used  Substance and Sexual Activity  . Alcohol use: Never  . Drug use: No  . Sexual activity: Not on file  Other Topics Concern  . Not on file  Social History Narrative  . Not on file   Social Determinants of Health   Financial Resource Strain: Medium Risk  . Difficulty of Paying Living Expenses: Somewhat hard  Food Insecurity: No Food Insecurity  . Worried About Charity fundraiser in the Last Year: Never true  . Ran Out of Food in the Last Year: Never true    Transportation Needs: No Transportation Needs  . Lack of Transportation (Medical): No  . Lack of Transportation (Non-Medical): No  Physical Activity: Inactive  . Days of Exercise per Week: 0 days  . Minutes of Exercise per Session: 0 min  Stress: Stress Concern Present  . Feeling of Stress : Very much  Social Connections: Moderately Isolated  . Frequency of Communication with Friends and Family: More than three times a week  . Frequency of Social Gatherings with Friends and Family: More than three times a week  . Attends Religious Services: Never  . Active Member of Clubs or Organizations: No  . Attends Archivist Meetings: Never  . Marital Status: Married  Human resources officer Violence: Not At Risk  . Fear of Current or Ex-Partner: No  . Emotionally Abused: No  . Physically Abused: No  . Sexually Abused: No    FAMILY HISTORY:  Family History  Problem Relation Age of Onset  . Hypertension Father   . Dementia Father        died age 20  . Heart disease Father   . Heart failure Other     CURRENT MEDICATIONS:  Current Outpatient Medications  Medication Sig Dispense Refill  . acetaminophen (TYLENOL) 500 MG tablet Take 500 mg by mouth every 6 (six) hours as needed for moderate pain or headache.    . albuterol (PROVENTIL) (2.5 MG/3ML) 0.083% nebulizer solution Take 3 mLs (2.5 mg total) by nebulization every 6 (six) hours as needed for wheezing or shortness of breath. (Patient not taking: Reported on 12/05/2019) 75 mL 12  . ALPRAZolam (XANAX) 0.25 MG tablet Take 1 tablet (0.25 mg total) by mouth 3 (three) times daily as needed for anxiety. 30 tablet 0  . apixaban (ELIQUIS) 5 MG TABS tablet Take 1 tablet (5 mg total) by mouth 2 (two) times daily. 60 tablet 6  . CARBOPLATIN IV Inject into the vein once a week.    Marland Kitchen HYDROcodone-homatropine (HYCODAN) 5-1.5 MG/5ML syrup Take 15 mLs by mouth every 8 (eight) hours as needed for cough. 480 mL 0  . lidocaine-prilocaine (EMLA) cream  Apply a small amount to port a cath site and cover with plastic wrap 1 hour prior to chemotherapy appointments 30 g 3  . metoprolol tartrate (LOPRESSOR) 25 MG tablet Take 1.5 tablets (37.5 mg total) by mouth 2 (two) times daily. 270 tablet 2  . PACLitaxel (TAXOL IV) Inject into the vein once a week.    . prochlorperazine (COMPAZINE) 10 MG tablet Take 1 tablet (10 mg total) by mouth every 6 (six) hours as needed (Nausea or vomiting). 30 tablet 1   No current facility-administered medications for this visit.    ALLERGIES:  No Known Allergies  PHYSICAL EXAM:  Performance status (ECOG): 1 - Symptomatic but completely ambulatory  Vitals:   12/05/19 0917  BP: 118/66  Pulse: Marland Kitchen)  52  Resp: 18  Temp: (!) 97 F (36.1 C)  SpO2: 97%   Wt Readings from Last 3 Encounters:  12/05/19 176 lb 3.2 oz (79.9 kg)  11/28/19 174 lb 12.8 oz (79.3 kg)  11/08/19 171 lb 8 oz (77.8 kg)   Physical Exam Vitals reviewed.  Constitutional:      Appearance: Normal appearance. He is obese.  Cardiovascular:     Rate and Rhythm: Normal rate. Rhythm irregular.     Pulses: Normal pulses.     Heart sounds: Normal heart sounds.  Pulmonary:     Effort: Pulmonary effort is normal.     Breath sounds: Normal breath sounds.  Chest:     Comments: Port-a-Cath in R chest Neurological:     General: No focal deficit present.     Mental Status: He is alert and oriented to person, place, and time.  Psychiatric:        Mood and Affect: Mood normal.        Behavior: Behavior normal.     LABORATORY DATA:  I have reviewed the labs as listed.  CBC Latest Ref Rng & Units 12/05/2019 11/28/2019 11/01/2019  WBC 4.0 - 10.5 K/uL 9.3 12.9(H) 10.7(H)  Hemoglobin 13.0 - 17.0 g/dL 12.3(L) 13.7 13.4  Hematocrit 39 - 52 % 37.5(L) 43.0 42.3  Platelets 150 - 400 K/uL 182 172 192   CMP Latest Ref Rng & Units 12/05/2019 11/28/2019 11/01/2019  Glucose 70 - 99 mg/dL 164(H) 117(H) 110(H)  BUN 8 - 23 mg/dL 20 18 14   Creatinine 0.61 -  1.24 mg/dL 0.95 1.20 1.04  Sodium 135 - 145 mmol/L 137 136 135  Potassium 3.5 - 5.1 mmol/L 4.3 4.2 4.1  Chloride 98 - 111 mmol/L 99 100 102  CO2 22 - 32 mmol/L 28 27 27   Calcium 8.9 - 10.3 mg/dL 9.1 9.3 8.9  Total Protein 6.5 - 8.1 g/dL 6.7 7.5 -  Total Bilirubin 0.3 - 1.2 mg/dL 0.6 0.6 -  Alkaline Phos 38 - 126 U/L 67 70 -  AST 15 - 41 U/L 20 19 -  ALT 0 - 44 U/L 21 18 -    DIAGNOSTIC IMAGING:  I have independently reviewed the scans and discussed with the patient. ECHOCARDIOGRAM COMPLETE  Result Date: 11/11/2019    ECHOCARDIOGRAM REPORT   Patient Name:   GREGOR DERSHEM Date of Exam: 11/11/2019 Medical Rec #:  [id number]      Height:       63.0 in Accession #:    [id number]     Weight:       171.5 lb Date of Birth:  04-10-1950      BSA:          1.811 m Patient Age:    15 years       BP:           122/73 mmHg Patient Gender: M              HR:           87 bpm. Exam Location:  [facility] Procedure: 2D Echo Indications:    Atrial Fibrillation 427.31 / I48.91  History:        Patient has no prior history of Echocardiogram examinations.                 Previous Myocardial Infarction, Arrythmias:Atrial Fibrillation;                 Risk Factors:Former Smoker, Dyslipidemia and Hypertension.  Sonographer:    Leavy Cella RDCS (AE) Referring Phys: [id number] [place]  1. Left ventricular ejection fraction, by estimation, is 65 to 70%. The left ventricle has normal function. The left ventricle has no regional wall motion abnormalities. There is moderate left ventricular hypertrophy. Left ventricular diastolic parameters are indeterminate.  2. Right ventricular systolic function is normal. The right ventricular size is normal.  3. A small pericardial effusion is present. The pericardial effusion is circumferential. There is no evidence of cardiac tamponade.  4. The mitral valve is normal in structure. Trivial mitral valve regurgitation. No evidence of mitral stenosis.  5. The aortic valve  has an indeterminant number of cusps. Aortic valve regurgitation is not visualized. No aortic stenosis is present.  6. The inferior vena cava is normal in size with greater than 50% respiratory variability, suggesting right atrial pressure of 3 mmHg. FINDINGS  Left Ventricle: Left ventricular ejection fraction, by estimation, is 65 to 70%. The left ventricle has normal function. The left ventricle has no regional wall motion abnormalities. The left ventricular internal cavity size was normal in size. There is  moderate left ventricular hypertrophy. Left ventricular diastolic parameters are indeterminate. Right Ventricle: The right ventricular size is normal. No increase in right ventricular wall thickness. Right ventricular systolic function is normal. Left Atrium: Left atrial size was normal in size. Right Atrium: Right atrial size was normal in size. Pericardium: A small pericardial effusion is present. The pericardial effusion is circumferential. There is no evidence of cardiac tamponade. Mitral Valve: The mitral valve is normal in structure. Trivial mitral valve regurgitation. No evidence of mitral valve stenosis. Tricuspid Valve: The tricuspid valve is normal in structure. Tricuspid valve regurgitation is not demonstrated. No evidence of tricuspid stenosis. Aortic Valve: The aortic valve has an indeterminant number of cusps. Aortic valve regurgitation is not visualized. No aortic stenosis is present. Aortic valve mean gradient measures 2.0 mmHg. Aortic valve peak gradient measures 3.2 mmHg. Aortic valve area, by VTI measures 2.10 cm. Pulmonic Valve: The pulmonic valve was not well visualized. Pulmonic valve regurgitation is not visualized. No evidence of pulmonic stenosis. Aorta: The aortic root is normal in size and structure. Pulmonary Artery: Indeterminant PASP, inadequate TR jet. Venous: The inferior vena cava is normal in size with greater than 50% respiratory variability, suggesting right atrial pressure  of 3 mmHg. IAS/Shunts: No atrial level shunt detected by color flow Doppler.  LEFT VENTRICLE PLAX 2D LVIDd:         4.25 cm  Diastology LVIDs:         2.59 cm  LV e' medial:    7.04 cm/s LV PW:         1.44 cm  LV E/e' medial:  14.3 LV IVS:        1.30 cm  LV e' lateral:   7.70 cm/s LVOT diam:     1.80 cm  LV E/e' lateral: 13.1 LV SV:         39 LV SV Index:   21 LVOT Area:     2.54 cm  RIGHT VENTRICLE RV S prime:     10.40 cm/s TAPSE (M-mode): 1.8 cm LEFT ATRIUM             Index       RIGHT ATRIUM           Index LA diam:        3.90 cm 2.15 cm/m  RA Area:     13.30 cm  LA Vol (A2C):   42.6 ml 23.52 ml/m RA Volume:   32.40 ml  17.89 ml/m LA Vol (A4C):   54.2 ml 29.92 ml/m LA Biplane Vol: 48.2 ml 26.61 ml/m  AORTIC VALVE AV Area (Vmax):    2.17 cm AV Area (Vmean):   2.02 cm AV Area (VTI):     2.10 cm AV Vmax:           89.98 cm/s AV Vmean:          66.890 cm/s AV VTI:            0.184 m AV Peak Grad:      3.2 mmHg AV Mean Grad:      2.0 mmHg LVOT Vmax:         76.69 cm/s LVOT Vmean:        53.169 cm/s LVOT VTI:          0.152 m LVOT/AV VTI ratio: 0.83  AORTA Ao Root diam: 3.20 cm MITRAL VALVE MV Area (PHT): 3.53 cm     SHUNTS MV Decel Time: 215 msec     Systemic VTI:  0.15 m MV E velocity: 101.00 cm/s  Systemic Diam: 1.80 cm Carlyle Dolly MD Electronically signed by Carlyle Dolly MD Signature Date/Time: 11/11/2019/3:34:11 PM    Final      ASSESSMENT:  1. Left lung cancer: -Presentation to the ER on 10/06/2019 with cough and dizziness. -CT chest with contrast on 10/06/2019 showed large left suprahilar mass completely obstructing left upper lobe bronchus, measuring 7 x 5.3 x 5.8 cm. Mass extends into AP window and narrows the left mainstem bronchus. Small subpleural nodule in the left lower lobe measuring 4 mm indeterminate. AP window nodal mass measuring 3.4 cm. No supraclavicular adenopathy. Subcarinal enlarged lymph node measures 1.4 cm. -PET scan on 10/14/2019 shows large hypermetabolic  left upper lobe mass surrounding the left hilum, extending into pleural surface not invading the chest wall. Direct invasion into the AP window and metastatic subcarinal lymph node, T4 N2 M0. 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 -10 pound weight loss in the last 6 months. Hoarseness for last 1 week. -MRI of the brain on 10/28/2019 with no evidence of metastasis. -Concurrent chemoradiation therapy started on 11/28/2019.  2. Social/family history: -He smoked 1 pack/day for 45 years, quit 5 years ago. -No family history of malignancies.   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 -I have reviewed his labs.  Albumin is low at 3.1.  Encouraged high-protein intake. -CBC was normal.  He will proceed with his next cycle today. -RTC 1 week with labs and treatment.  2. Coughing spells: -Continue Hycodan twice daily as needed.  3. Anxiety: -Continue Xanax 0.25 mg twice daily as needed.  4. Atrial fibrillation: -Continue Eliquis 5 mg twice daily managed by Dr. Harl Bowie.  5.  Nutrition: -Continue 1 can of boost per day.  Start high-protein diet.   Orders placed this encounter:  No orders of the defined types were placed in this encounter.    Derek Jack, MD [place] [phone]   I, Milinda Antis, am acting as a scribe for Dr. Sanda Linger.  I, Derek Jack MD, have reviewed the above documentation for accuracy and completeness, and I agree with the above.

## 2019-12-05 NOTE — Progress Notes (Signed)
Alan [name] presents today for D1C2 Carboplatin and Taxol. Pt denies any new changes or symptoms since last treatment. Lab results and vitals have been reviewed and are stable and within parameters for treatment. Patient has been assessed by Dr. Delton Coombes who has approved proceeding with treatment today as planned.  Infusions tolerated without incident or complaint. VSS upon completion of treatment. Port flushed and deaccessed per protocol, see MAR and IV flowsheet for details. Discharged in satisfactory condition with follow up instructions.

## 2019-12-05 NOTE — Progress Notes (Signed)
Patient was assessed by Dr. Katragadda and labs have been reviewed.  Patient is okay to proceed with treatment today. Primary RN and pharmacy aware.   

## 2019-12-05 NOTE — Patient Instructions (Signed)
[place] at [facility] Discharge Instructions  You were seen today by Dr. Delton Coombes. He went over your recent results. You received your treatment today. Dr. Delton Coombes will see you back in 1 week for labs and follow up.   Thank you for choosing [place] at [facility] to provide your oncology and hematology care.  To afford each patient quality time with our provider, please arrive at least 15 minutes before your scheduled appointment time.   If you have a lab appointment with the [place] please come in thru the Main Entrance and check in at the main information desk  You need to re-schedule your appointment should you arrive 10 or more minutes late.  We strive to give you quality time with our providers, and arriving late affects you and other patients whose appointments are after yours.  Also, if you no show three or more times for appointments you may be dismissed from the clinic at the providers discretion.     Again, thank you for choosing [facility].  Our hope is that these requests will decrease the amount of time that you wait before being seen by our physicians.       _____________________________________________________________  Should you have questions after your visit to [facility], please contact our office at [phone] between the hours of 8:00 a.m. and 4:30 p.m.  Voicemails left after 4:00 p.m. will not be returned until the following business day.  For prescription refill requests, have your pharmacy contact our office and allow 72 hours.    Cancer Center Support Programs:   > Cancer Support Group  2nd Tuesday of the month 1pm-2pm, Journey Room

## 2019-12-05 NOTE — Patient Instructions (Signed)
[facility] Discharge Instructions for Patients Receiving Chemotherapy   Beginning January 23rd 2017 lab work for the [facility] will be done in the  Main lab at [facility] on 1st floor. If you have a lab appointment with the [facility] please come in thru the  Main Entrance and check in at the main information desk   Today you received the following chemotherapy agents Carboplatin and Taxol  To help prevent nausea and vomiting after your treatment, we encourage you to take your nausea medication    If you develop nausea and vomiting, or diarrhea that is not controlled by your medication, call the clinic.  The clinic phone number is [phone]. Office hours are Monday-Friday 8:30am-5:00pm.  BELOW ARE SYMPTOMS THAT SHOULD BE REPORTED IMMEDIATELY:  *FEVER GREATER THAN 101.0 F  *CHILLS WITH OR WITHOUT FEVER  NAUSEA AND VOMITING THAT IS NOT CONTROLLED WITH YOUR NAUSEA MEDICATION  *UNUSUAL SHORTNESS OF BREATH  *UNUSUAL BRUISING OR BLEEDING  TENDERNESS IN MOUTH AND THROAT WITH OR WITHOUT PRESENCE OF ULCERS  *URINARY PROBLEMS  *BOWEL PROBLEMS  UNUSUAL RASH Items with * indicate a potential emergency and should be followed up as soon as possible. If you have an emergency after office hours please contact your primary care physician or go to the nearest emergency department.  Please call the clinic during office hours if you have any questions or concerns.   You may also contact the Patient Navigator at [phone] should you have any questions or need assistance in obtaining follow up care.      Resources For Cancer Patients and their Caregivers ? American Cancer Society: Can assist with transportation, wigs, general needs, runs Look Good Feel Better.        952-597-0986 ? Cancer Care: Provides financial assistance, online support groups, medication/co-pay assistance.  1-800-813-HOPE 806-326-4353) ? [facility] Assists  [place] cancer patients and their families through emotional , educational and financial support.  660-656-0295 ? [place] DSS Where to apply for food stamps, Medicaid and utility assistance. (848) 348-8168 ? [facility]: Transportation to medical appointments. 317-459-4101 ? Social Security Administration: May apply for disability if have a Stage IV cancer. 470-759-0877 234-462-8397 ? [employer], Disability and Transit Services: Assists with nutrition, care and transit needs. ([phone] [phone]

## 2019-12-08 ENCOUNTER — Telehealth: Payer: Self-pay | Admitting: Student

## 2019-12-08 MED ORDER — METOPROLOL TARTRATE 25 MG PO TABS: 25.0000 mg | ORAL_TABLET | Freq: Two times a day (BID) | ORAL | 3 refills | Status: DC

## 2019-12-08 NOTE — Telephone Encounter (Signed)
I spoke with Karie Kirks, PA-C with [facility] cancer center.   She said Alan [name] is getting dual chemo and radiation weekly for his lung cancer. Today he c/o feeling "off" and more SOB than usual.   His BP was 157/90, HR 50. They wanted you to be aware and they question if he needs to be seen.   He takes Eliquis 5 mg BID and Lopressor 37.5 mg BID

## 2019-12-08 NOTE — Telephone Encounter (Signed)
Karie Kirks, PA-C had left for the day.I left message for her that we were lowering his lopressor to 25 mg BID.   We offered patient both 11/1 and 11/2 for an apt but he declined as it interferes with his chemo and radiation.   I told patient to go to the ED if sx's worsen, he agrees.

## 2019-12-08 NOTE — Telephone Encounter (Signed)
Lower lopressor to 25mg  bid, can he get an appt with PA this week or next. If symptoms worsen needs ER evaluation Ideally this would be evaluated by his pcp and if they think its cardiac then he would get into see [name]  Carlyle Dolly MD

## 2019-12-08 NOTE — Telephone Encounter (Signed)
Patient is there for cancer treatment , they need to speak with either [place] or Dr Harl Bowie as soon as possible

## 2019-12-09 ENCOUNTER — Telehealth (HOSPITAL_COMMUNITY): Payer: Self-pay

## 2019-12-09 NOTE — Telephone Encounter (Signed)
Nutrition  Patient identified on Malnutrition Screening report for weight loss and poor appetite.   Chart reviewed.   Called patient to introduce self and service at [facility].  No answer.  Left message for patient to return call.   Lerone Onder B. Zenia Resides, [place], [place] Registered Dietitian [phone] (mobile)

## 2019-12-12 ENCOUNTER — Inpatient Hospital Stay (HOSPITAL_COMMUNITY): Payer: Medicare Other

## 2019-12-12 ENCOUNTER — Other Ambulatory Visit: Payer: Self-pay

## 2019-12-12 ENCOUNTER — Inpatient Hospital Stay (HOSPITAL_BASED_OUTPATIENT_CLINIC_OR_DEPARTMENT_OTHER): Payer: Medicare Other | Admitting: Hematology

## 2019-12-12 VITALS — BP 95/78 | HR 71 | Temp 97.3°F | Resp 18 | Wt 170.4 lb

## 2019-12-12 VITALS — BP 85/64 | HR 83 | Temp 96.9°F | Resp 18

## 2019-12-12 DIAGNOSIS — Z95828 Presence of other vascular implants and grafts: Secondary | ICD-10-CM

## 2019-12-12 DIAGNOSIS — C3492 Malignant neoplasm of unspecified part of left bronchus or lung: Secondary | ICD-10-CM

## 2019-12-12 DIAGNOSIS — Z5111 Encounter for antineoplastic chemotherapy: Secondary | ICD-10-CM | POA: Diagnosis not present

## 2019-12-12 LAB — CBC WITH DIFFERENTIAL/PLATELET
Abs Immature Granulocytes: 0.07 10*3/uL (ref 0.00–0.07)
Basophils Absolute: 0 10*3/uL (ref 0.0–0.1)
Basophils Relative: 0 %
Eosinophils Absolute: 0 10*3/uL (ref 0.0–0.5)
Eosinophils Relative: 0 %
HCT: 35.1 % — ABNORMAL LOW (ref 39.0–52.0)
Hemoglobin: 11.5 g/dL — ABNORMAL LOW (ref 13.0–17.0)
Immature Granulocytes: 1 %
Lymphocytes Relative: 3 %
Lymphs Abs: 0.2 10*3/uL — ABNORMAL LOW (ref 0.7–4.0)
MCH: 28.2 pg (ref 26.0–34.0)
MCHC: 32.8 g/dL (ref 30.0–36.0)
MCV: 86 fL (ref 80.0–100.0)
Monocytes Absolute: 0.5 10*3/uL (ref 0.1–1.0)
Monocytes Relative: 7 %
Neutro Abs: 6.2 10*3/uL (ref 1.7–7.7)
Neutrophils Relative %: 89 %
Platelets: 172 10*3/uL (ref 150–400)
RBC: 4.08 MIL/uL — ABNORMAL LOW (ref 4.22–5.81)
RDW: 13.6 % (ref 11.5–15.5)
WBC: 6.9 10*3/uL (ref 4.0–10.5)
nRBC: 0 % (ref 0.0–0.2)

## 2019-12-12 LAB — COMPREHENSIVE METABOLIC PANEL
ALT: 19 U/L (ref 0–44)
AST: 19 U/L (ref 15–41)
Albumin: 3.1 g/dL — ABNORMAL LOW (ref 3.5–5.0)
Alkaline Phosphatase: 66 U/L (ref 38–126)
Anion gap: 9 (ref 5–15)
BUN: 23 mg/dL (ref 8–23)
CO2: 26 mmol/L (ref 22–32)
Calcium: 8.8 mg/dL — ABNORMAL LOW (ref 8.9–10.3)
Chloride: 95 mmol/L — ABNORMAL LOW (ref 98–111)
Creatinine, Ser: 1.17 mg/dL (ref 0.61–1.24)
GFR, Estimated: >60 mL/min (ref >60)
Glucose, Bld: 223 mg/dL — ABNORMAL HIGH (ref 70–99)
Potassium: 4.5 mmol/L (ref 3.5–5.1)
Sodium: 130 mmol/L — ABNORMAL LOW (ref 135–145)
Total Bilirubin: 1.2 mg/dL (ref 0.3–1.2)
Total Protein: 6.6 g/dL (ref 6.5–8.1)

## 2019-12-12 MED ORDER — HEPARIN SOD (PORK) LOCK FLUSH 100 UNIT/ML IV SOLN
500.0000 [IU] | Freq: Once | INTRAVENOUS | Status: AC | PRN
  Administered 2019-12-12: 500 [IU]

## 2019-12-12 MED ORDER — DIPHENHYDRAMINE HCL 50 MG/ML IJ SOLN
50.0000 mg | Freq: Once | INTRAMUSCULAR | Status: AC
  Administered 2019-12-12: 50 mg via INTRAVENOUS
  Filled 2019-12-12: qty 1

## 2019-12-12 MED ORDER — SODIUM CHLORIDE 0.9% FLUSH
10.0000 mL | INTRAVENOUS | Status: DC | PRN
  Administered 2019-12-12: 10 mL

## 2019-12-12 MED ORDER — PALONOSETRON HCL INJECTION 0.25 MG/5ML
0.2500 mg | Freq: Once | INTRAVENOUS | Status: AC
  Administered 2019-12-12: 0.25 mg via INTRAVENOUS
  Filled 2019-12-12: qty 5

## 2019-12-12 MED ORDER — SODIUM CHLORIDE 0.9 % IV SOLN
20.0000 mg | Freq: Once | INTRAVENOUS | Status: AC
  Administered 2019-12-12: 20 mg via INTRAVENOUS
  Filled 2019-12-12: qty 2

## 2019-12-12 MED ORDER — SODIUM CHLORIDE 0.9 % IV SOLN
179.2000 mg | Freq: Once | INTRAVENOUS | Status: AC
  Administered 2019-12-12: 180 mg via INTRAVENOUS
  Filled 2019-12-12: qty 18

## 2019-12-12 MED ORDER — FAMOTIDINE IN NACL 20-0.9 MG/50ML-% IV SOLN
20.0000 mg | Freq: Once | INTRAVENOUS | Status: AC
  Administered 2019-12-12: 20 mg via INTRAVENOUS
  Filled 2019-12-12: qty 50

## 2019-12-12 MED ORDER — SODIUM CHLORIDE 0.9 % IV SOLN
45.0000 mg/m2 | Freq: Once | INTRAVENOUS | Status: AC
  Administered 2019-12-12: 84 mg via INTRAVENOUS
  Filled 2019-12-12: qty 14

## 2019-12-12 MED ORDER — SODIUM CHLORIDE 0.9 % IV SOLN: Freq: Once | INTRAVENOUS | Status: AC

## 2019-12-12 NOTE — Patient Instructions (Signed)
[place] at [facility] Discharge Instructions  You were seen today by Dr. Delton Coombes. He went over your recent results. You received your treatment today. Purchase Ensure Plus and drink 4-5 cans daily to maintain your weight. Dr. Delton Coombes will see you back in 1 week for labs and follow up.   Thank you for choosing [place] at [facility] to provide your oncology and hematology care.  To afford each patient quality time with our provider, please arrive at least 15 minutes before your scheduled appointment time.   If you have a lab appointment with the [facility] please come in thru the Main Entrance and check in at the main information desk  You need to re-schedule your appointment should you arrive 10 or more minutes late.  We strive to give you quality time with our providers, and arriving late affects you and other patients whose appointments are after yours.  Also, if you no show three or more times for appointments you may be dismissed from the clinic at the providers discretion.     Again, thank you for choosing [facility].  Our hope is that these requests will decrease the amount of time that you wait before being seen by our physicians.       _____________________________________________________________  Should you have questions after your visit to [facility], please contact our office at [phone] between the hours of 8:00 a.m. and 4:30 p.m.  Voicemails left after 4:00 p.m. will not be returned until the following business day.  For prescription refill requests, have your pharmacy contact our office and allow 72 hours.    Cancer Center Support Programs:   > Cancer Support Group  2nd Tuesday of the month 1pm-2pm, Journey Room

## 2019-12-12 NOTE — Patient Instructions (Signed)
[facility] Discharge Instructions for Patients Receiving Chemotherapy  Today you received the following chemotherapy agents   To help prevent nausea and vomiting after your treatment, we encourage you to take your nausea medication   If you develop nausea and vomiting that is not controlled by your nausea medication, call the clinic.   BELOW ARE SYMPTOMS THAT SHOULD BE REPORTED IMMEDIATELY:  *FEVER GREATER THAN 100.5 F  *CHILLS WITH OR WITHOUT FEVER  NAUSEA AND VOMITING THAT IS NOT CONTROLLED WITH YOUR NAUSEA MEDICATION  *UNUSUAL SHORTNESS OF BREATH  *UNUSUAL BRUISING OR BLEEDING  TENDERNESS IN MOUTH AND THROAT WITH OR WITHOUT PRESENCE OF ULCERS  *URINARY PROBLEMS  *BOWEL PROBLEMS  UNUSUAL RASH Items with * indicate a potential emergency and should be followed up as soon as possible.  Feel free to call the clinic should you have any questions or concerns. The clinic phone number is [phone].  Please show the CHEMO ALERT CARD at check-in to the Emergency Department and triage nurse.   

## 2019-12-12 NOTE — Progress Notes (Signed)
Patient presents today for treatment and follow up visit with Dr. Delton Coombes. Labs reviewed by MD and within parameters for treatment. MAR reviewed.   Message received from [place] RN/ Dr. Delton Coombes to proceed with treatment.   Treatment given today per MD orders. Tolerated infusion without adverse affects. Vital signs stable. No complaints at this time. Discharged from clinic via wheel chair in stable condition. Alert and oriented x 3. F/U with [facility] as scheduled.

## 2019-12-12 NOTE — Patient Instructions (Signed)
[facility] at [facility] Discharge Instructions  Labs drawn from portacath today   Thank you for choosing [facility] at [facility] to provide your oncology and hematology care.  To afford each patient quality time with our provider, please arrive at least 15 minutes before your scheduled appointment time.   If you have a lab appointment with the Cancer Center please come in thru the Main Entrance and check in at the main information desk.  You need to re-schedule your appointment should you arrive 10 or more minutes late.  We strive to give you quality time with our providers, and arriving late affects you and other patients whose appointments are after yours.  Also, if you no show three or more times for appointments you may be dismissed from the clinic at the providers discretion.     Again, thank you for choosing [facility].  Our hope is that these requests will decrease the amount of time that you wait before being seen by our physicians.       _____________________________________________________________  Should you have questions after your visit to  Cancer Center, please contact our office at [phone] and follow the prompts.  Our office hours are 8:00 a.m. and 4:30 p.m. Monday - Friday.  Please note that voicemails left after 4:00 p.m. may not be returned until the following business day.  We are closed weekends and major holidays.  You do have access to a nurse 24-7, just call the main number to the clinic [phone] and do not press any options, hold on the line and a nurse will answer the phone.    For prescription refill requests, have your pharmacy contact our office and allow 72 hours.    Due to Covid, you will need to wear a mask upon entering the hospital. If you do not have a mask, a mask will be given to you at the Main Entrance upon arrival. For doctor visits, patients may have 1 support person age 18  or older with them. For treatment visits, patients can not have anyone with them due to social distancing guidelines and our immunocompromised population.     

## 2019-12-12 NOTE — Progress Notes (Signed)
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25 mg (12/05/2019) CARBOplatin (PARAPLATIN) 180 mg in sodium chloride 0.9 % 250 mL chemo infusion, 180 mg (100 % of original dose 176 mg), Intravenous,  Once, 2 of 4 cycles Dose modification:   (original dose 176 mg, Cycle 1),   (original dose 201.2 mg, Cycle 2) Administration: 180 mg (11/28/2019), 200 mg (12/05/2019) PACLitaxel (TAXOL) 84 mg in sodium chloride 0.9 % 250 mL chemo infusion (</= 80mg /m2), 45 mg/m2 = 84 mg, Intravenous,  Once, 2 of 4 cycles Administration: 84 mg (11/28/2019), 84 mg (12/05/2019)  for chemotherapy treatment.      CANCER STAGING: Cancer Staging Squamous cell lung cancer, left [facility]) Staging form: Lung, AJCC 8th Edition - Clinical stage from 11/08/2019: Stage IIIB (cT4, cN2, cM0) - Signed by Derek Jack, MD on 11/08/2019   INTERVAL HISTORY:  Alan [name], a 69 y.o. male, returns for routine follow-up and consideration for next cycle of chemotherapy. Alan [name] was last seen on 12/05/2019.  Due for cycle #3 of carboplatin and paclitaxel today.   Today he is accompanied by his wife. Overall, he tells me he has been feeling okay. He is tolerating the  radiation well and denies trouble swallowing, numbness or N/V/D. His appetite is terrible; he is eating 3 small meals and is drinking 4 cans of regular Ensure. He reports getting tired with his chewing. His cough is stable; he reports seeing trace blood in his sputum occasionally and continues taking Hycodan BID. He is taking Xanax PRN.  Overall, he feels ready for next cycle of chemo today.    REVIEW OF SYSTEMS:  Review of Systems  Constitutional: Positive for appetite change (25%), fatigue (30%) and unexpected weight change (6 lbs loss in 1 week).  HENT:   Negative for trouble swallowing.   Respiratory: Positive for cough and shortness of breath (w/ cough).   Gastrointestinal: Negative for diarrhea, nausea and vomiting.  Neurological: Negative for numbness.  All other systems reviewed and are negative.   PAST MEDICAL/SURGICAL HISTORY:  Past Medical History:  Diagnosis Date  . Arthritis   . Hyperlipemia   . Hypertension   . Hyperthyroidism   . Myocardial infarction ([place])   . Paroxysmal atrial fibrillation (HCC)   . Port-A-Cath in place 11/14/2019  . Tachycardia    Past Surgical History:  Procedure Laterality Date  . CARDIAC CATHETERIZATION    . PORTACATH PLACEMENT Right 11/01/2019   Procedure: INSERTION PORT-A-CATH;  Surgeon: Aviva Signs, MD;  Location: [facility];  Service: General;  Laterality: Right;    SOCIAL HISTORY:  Social History   Socioeconomic History  . Marital status: Married    Spouse name: Not on file  . Number of children: 1  .  Years of education: Not on file  . Highest education level: Not on file  Occupational History  . Occupation: retired  . Occupation: Glass blower/designer: [employer]  Tobacco Use  . Smoking status: Former Smoker    Years: 45.00    Types: 31, Cigarettes    Quit date: 10/19/2014    Years since quitting: 5.1  . Smokeless tobacco: Never Used  Vaping Use  . Vaping Use: Never used  Substance and Sexual Activity  . Alcohol use:  Never  . Drug use: No  . Sexual activity: Not on file  Other Topics Concern  . Not on file  Social History Narrative  . Not on file   Social Determinants of Health   Financial Resource Strain: Medium Risk  . Difficulty of Paying Living Expenses: Somewhat hard  Food Insecurity: No Food Insecurity  . Worried About Charity fundraiser in the Last Year: Never true  . Ran Out of Food in the Last Year: Never true  Transportation Needs: No Transportation Needs  . Lack of Transportation (Medical): No  . Lack of Transportation (Non-Medical): No  Physical Activity: Inactive  . Days of Exercise per Week: 0 days  . Minutes of Exercise per Session: 0 min  Stress: Stress Concern Present  . Feeling of Stress : Very much  Social Connections: Moderately Isolated  . Frequency of Communication with Friends and Family: More than three times a week  . Frequency of Social Gatherings with Friends and Family: More than three times a week  . Attends Religious Services: Never  . Active Member of Clubs or Organizations: No  . Attends Archivist Meetings: Never  . Marital Status: Married  Human resources officer Violence: Not At Risk  . Fear of Current or Ex-Partner: No  . Emotionally Abused: No  . Physically Abused: No  . Sexually Abused: No    FAMILY HISTORY:  Family History  Problem Relation Age of Onset  . Hypertension Father   . Dementia Father        died age 22  . Heart disease Father   . Heart failure Other     CURRENT MEDICATIONS:  Current Outpatient Medications  Medication Sig Dispense Refill  . acetaminophen (TYLENOL) 500 MG tablet Take 500 mg by mouth every 6 (six) hours as needed for moderate pain or headache.    . albuterol (PROVENTIL) (2.5 MG/3ML) 0.083% nebulizer solution Take 3 mLs (2.5 mg total) by nebulization every 6 (six) hours as needed for wheezing or shortness of breath. 75 mL 12  . ALPRAZolam (XANAX) 0.25 MG tablet Take 1 tablet (0.25 mg total) by mouth 3 (three)  times daily as needed for anxiety. 30 tablet 0  . apixaban (ELIQUIS) 5 MG TABS tablet Take 1 tablet (5 mg total) by mouth 2 (two) times daily. 60 tablet 6  . CARBOPLATIN IV Inject into the vein once a week.    Marland Kitchen HYDROcodone-homatropine (HYCODAN) 5-1.5 MG/5ML syrup Take 15 mLs by mouth every 8 (eight) hours as needed for cough. 480 mL 0  . metoprolol tartrate (LOPRESSOR) 25 MG tablet Take 1 tablet (25 mg total) by mouth 2 (two) times daily. 180 tablet 3  . PACLitaxel (TAXOL IV) Inject into the vein once a week.    . lidocaine-prilocaine (EMLA) cream Apply a small amount to port a cath site and cover with plastic wrap 1 hour prior to chemotherapy appointments (Patient not taking: Reported on 12/12/2019) 30 g 3  . prochlorperazine (COMPAZINE)  10 MG tablet Take 1 tablet (10 mg total) by mouth every 6 (six) hours as needed (Nausea or vomiting). (Patient not taking: Reported on 12/12/2019) 30 tablet 1   No current facility-administered medications for this visit.    ALLERGIES:  No Known Allergies  PHYSICAL EXAM:  Performance status (ECOG): 1 - Symptomatic but completely ambulatory  Vitals:   12/12/19 0906  BP: 95/78  Pulse: 71  Resp: 18  Temp: (!) 97.3 F (36.3 C)  SpO2: 98%   Wt Readings from Last 3 Encounters:  12/12/19 170 lb 6.7 oz (77.3 kg)  12/05/19 176 lb 3.2 oz (79.9 kg)  11/28/19 174 lb 12.8 oz (79.3 kg)   Physical Exam Vitals reviewed.  Constitutional:      Appearance: Normal appearance. He is obese.  Chest:     Comments: Port-a-Cath in R chest Neurological:     General: No focal deficit present.     Mental Status: He is alert and oriented to person, place, and time.  Psychiatric:        Mood and Affect: Mood normal.        Behavior: Behavior normal.     LABORATORY DATA:  I have reviewed the labs as listed.  CBC Latest Ref Rng & Units 12/12/2019 12/05/2019 11/28/2019  WBC 4.0 - 10.5 K/uL 6.9 9.3 12.9(H)  Hemoglobin 13.0 - 17.0 g/dL 11.5(L) 12.3(L) 13.7    Hematocrit 39 - 52 % 35.1(L) 37.5(L) 43.0  Platelets 150 - 400 K/uL 172 182 172   CMP Latest Ref Rng & Units 12/12/2019 12/05/2019 11/28/2019  Glucose 70 - 99 mg/dL 223(H) 164(H) 117(H)  BUN 8 - 23 mg/dL 23 20 18   Creatinine 0.61 - 1.24 mg/dL 1.17 0.95 1.20  Sodium 135 - 145 mmol/L 130(L) 137 136  Potassium 3.5 - 5.1 mmol/L 4.5 4.3 4.2  Chloride 98 - 111 mmol/L 95(L) 99 100  CO2 22 - 32 mmol/L 26 28 27   Calcium 8.9 - 10.3 mg/dL 8.8(L) 9.1 9.3  Total Protein 6.5 - 8.1 g/dL 6.6 6.7 7.5  Total Bilirubin 0.3 - 1.2 mg/dL 1.2 0.6 0.6  Alkaline Phos 38 - 126 U/L 66 67 70  AST 15 - 41 U/L 19 20 19   ALT 0 - 44 U/L 19 21 18     DIAGNOSTIC IMAGING:  I have independently reviewed the scans and discussed with the patient. No results found.   ASSESSMENT:  1. Left lung cancer: -Presentation to the ER on 10/06/2019 with cough and dizziness. -CT chest with contrast on 10/06/2019 showed large left suprahilar mass completely obstructing left upper lobe bronchus, measuring 7 x 5.3 x 5.8 cm. Mass extends into AP window and narrows the left mainstem bronchus. Small subpleural nodule in the left lower lobe measuring 4 mm indeterminate. AP window nodal mass measuring 3.4 cm. No supraclavicular adenopathy. Subcarinal enlarged lymph node measures 1.4 cm. -PET scan on 10/14/2019 shows large hypermetabolic left upper lobe mass surrounding the left hilum, extending into pleural surface not invading the chest wall. Direct invasion into the AP window and metastatic subcarinal lymph node, T4 N2 M0. 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 -10 pound weight loss in the last 6 months. Hoarseness for last 1 week. -MRI of the brain on 10/28/2019 with no evidence of metastasis. -Concurrent chemoradiation therapy started on 11/28/2019.  2. Social/family history: -He smoked 1 pack/day for 45 years, quit 5 years ago. -No family history of  malignancies.   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  Did not experience any GI toxicity although reported decrease in appetite and lost some weight. -Reviewed his labs.  Albumin is low at 3.1, rest of LFTs are normal.  Glucose is 223.  Normal white count and platelet count. -He will proceed with his weekly carboplatin and paclitaxel today. -Reevaluate in 1 week with labs and possible treatment.  2. Coughing spells: -Continue Hycodan twice daily as needed.  3. Anxiety: -Continue Xanax 0.25 mg twice daily as needed.  4. Atrial fibrillation: -Continue Eliquis 5 mg twice daily managed by Dr. Harl Bowie.  5. Nutrition: -He lost 6 pounds since last visit. -He is eating 3 small meals per day and drinking 4 ensures per day.  He reports that he is tired of chewing. -Recommend changing to Ensure Plus 4 cans/day which will add 400 more calories.   Orders placed this encounter:  No orders of the defined types were placed in this encounter.    Derek Jack, MD [place] [phone]   I, Milinda Antis, am acting as a scribe for Dr. Sanda Linger.  I, Derek Jack MD, have reviewed the above documentation for accuracy and completeness, and I agree with the above.

## 2019-12-12 NOTE — Progress Notes (Signed)
Patient was assessed by Dr. Katragadda and labs have been reviewed.  Patient is okay to proceed with treatment today. Primary RN and pharmacy aware.   

## 2019-12-19 ENCOUNTER — Other Ambulatory Visit (HOSPITAL_COMMUNITY): Payer: Self-pay | Admitting: *Deleted

## 2019-12-19 ENCOUNTER — Other Ambulatory Visit (HOSPITAL_COMMUNITY): Payer: Self-pay | Admitting: Surgery

## 2019-12-19 ENCOUNTER — Inpatient Hospital Stay (HOSPITAL_COMMUNITY): Payer: Medicare Other

## 2019-12-19 ENCOUNTER — Inpatient Hospital Stay (HOSPITAL_BASED_OUTPATIENT_CLINIC_OR_DEPARTMENT_OTHER): Payer: Medicare Other | Admitting: Hematology

## 2019-12-19 ENCOUNTER — Other Ambulatory Visit: Payer: Self-pay

## 2019-12-19 ENCOUNTER — Inpatient Hospital Stay (HOSPITAL_COMMUNITY): Payer: Medicare Other | Attending: Hematology

## 2019-12-19 ENCOUNTER — Ambulatory Visit: Payer: Medicare Other | Admitting: Family Medicine

## 2019-12-19 VITALS — BP 105/66 | HR 68 | Temp 97.1°F | Resp 18 | Wt 167.4 lb

## 2019-12-19 VITALS — BP 97/65 | HR 86 | Temp 97.6°F | Resp 18

## 2019-12-19 DIAGNOSIS — Z5111 Encounter for antineoplastic chemotherapy: Secondary | ICD-10-CM | POA: Diagnosis present

## 2019-12-19 DIAGNOSIS — C3492 Malignant neoplasm of unspecified part of left bronchus or lung: Secondary | ICD-10-CM

## 2019-12-19 DIAGNOSIS — I10 Essential (primary) hypertension: Secondary | ICD-10-CM | POA: Diagnosis not present

## 2019-12-19 DIAGNOSIS — C3412 Malignant neoplasm of upper lobe, left bronchus or lung: Secondary | ICD-10-CM | POA: Diagnosis present

## 2019-12-19 DIAGNOSIS — Z7901 Long term (current) use of anticoagulants: Secondary | ICD-10-CM | POA: Diagnosis not present

## 2019-12-19 DIAGNOSIS — R058 Other specified cough: Secondary | ICD-10-CM | POA: Diagnosis not present

## 2019-12-19 DIAGNOSIS — I4891 Unspecified atrial fibrillation: Secondary | ICD-10-CM | POA: Diagnosis not present

## 2019-12-19 DIAGNOSIS — Z95828 Presence of other vascular implants and grafts: Secondary | ICD-10-CM

## 2019-12-19 LAB — COMPREHENSIVE METABOLIC PANEL
ALT: 28 U/L (ref 0–44)
AST: 26 U/L (ref 15–41)
Albumin: 3 g/dL — ABNORMAL LOW (ref 3.5–5.0)
Alkaline Phosphatase: 63 U/L (ref 38–126)
Anion gap: 9 (ref 5–15)
BUN: 30 mg/dL — ABNORMAL HIGH (ref 8–23)
CO2: 24 mmol/L (ref 22–32)
Calcium: 8.6 mg/dL — ABNORMAL LOW (ref 8.9–10.3)
Chloride: 98 mmol/L (ref 98–111)
Creatinine, Ser: 1.17 mg/dL (ref 0.61–1.24)
GFR, Estimated: >60 mL/min (ref >60)
Glucose, Bld: 146 mg/dL — ABNORMAL HIGH (ref 70–99)
Potassium: 4.1 mmol/L (ref 3.5–5.1)
Sodium: 131 mmol/L — ABNORMAL LOW (ref 135–145)
Total Bilirubin: 0.7 mg/dL (ref 0.3–1.2)
Total Protein: 6.6 g/dL (ref 6.5–8.1)

## 2019-12-19 LAB — CBC WITH DIFFERENTIAL/PLATELET
Abs Immature Granulocytes: 0.03 10*3/uL (ref 0.00–0.07)
Basophils Absolute: 0 10*3/uL (ref 0.0–0.1)
Basophils Relative: 1 %
Eosinophils Absolute: 0 10*3/uL (ref 0.0–0.5)
Eosinophils Relative: 0 %
HCT: 34.2 % — ABNORMAL LOW (ref 39.0–52.0)
Hemoglobin: 11.3 g/dL — ABNORMAL LOW (ref 13.0–17.0)
Immature Granulocytes: 1 %
Lymphocytes Relative: 7 %
Lymphs Abs: 0.3 10*3/uL — ABNORMAL LOW (ref 0.7–4.0)
MCH: 28.2 pg (ref 26.0–34.0)
MCHC: 33 g/dL (ref 30.0–36.0)
MCV: 85.3 fL (ref 80.0–100.0)
Monocytes Absolute: 0.3 10*3/uL (ref 0.1–1.0)
Monocytes Relative: 6 %
Neutro Abs: 3.3 10*3/uL (ref 1.7–7.7)
Neutrophils Relative %: 85 %
Platelets: 205 10*3/uL (ref 150–400)
RBC: 4.01 MIL/uL — ABNORMAL LOW (ref 4.22–5.81)
RDW: 13.7 % (ref 11.5–15.5)
WBC: 3.9 10*3/uL — ABNORMAL LOW (ref 4.0–10.5)
nRBC: 0 % (ref 0.0–0.2)

## 2019-12-19 MED ORDER — PALONOSETRON HCL INJECTION 0.25 MG/5ML
0.2500 mg | Freq: Once | INTRAVENOUS | Status: AC
  Administered 2019-12-19: 0.25 mg via INTRAVENOUS
  Filled 2019-12-19: qty 5

## 2019-12-19 MED ORDER — HYDROCODONE-HOMATROPINE 5-1.5 MG/5ML PO SYRP: 15.0000 mL | ORAL_SOLUTION | Freq: Three times a day (TID) | ORAL | 0 refills | Status: DC | PRN

## 2019-12-19 MED ORDER — FAMOTIDINE IN NACL 20-0.9 MG/50ML-% IV SOLN
20.0000 mg | Freq: Once | INTRAVENOUS | Status: AC
  Administered 2019-12-19: 20 mg via INTRAVENOUS
  Filled 2019-12-19: qty 50

## 2019-12-19 MED ORDER — LORAZEPAM 2 MG/ML IJ SOLN
INTRAMUSCULAR | Status: AC
  Filled 2019-12-19: qty 1

## 2019-12-19 MED ORDER — SODIUM CHLORIDE 0.9 % IV SOLN
179.2000 mg | Freq: Once | INTRAVENOUS | Status: AC
  Administered 2019-12-19: 180 mg via INTRAVENOUS
  Filled 2019-12-19: qty 18

## 2019-12-19 MED ORDER — SODIUM CHLORIDE 0.9 % IV SOLN: INTRAVENOUS | Status: AC

## 2019-12-19 MED ORDER — ALUM & MAG HYDROXIDE-SIMETH 400-400-40 MG/5ML PO SUSP: ORAL | 2 refills | Status: DC

## 2019-12-19 MED ORDER — DIPHENHYDRAMINE HCL 50 MG/ML IJ SOLN
50.0000 mg | Freq: Once | INTRAMUSCULAR | Status: AC
  Administered 2019-12-19: 50 mg via INTRAVENOUS
  Filled 2019-12-19: qty 1

## 2019-12-19 MED ORDER — SODIUM CHLORIDE 0.9 % IV SOLN
20.0000 mg | Freq: Once | INTRAVENOUS | Status: AC
  Administered 2019-12-19: 20 mg via INTRAVENOUS
  Filled 2019-12-19: qty 20

## 2019-12-19 MED ORDER — SODIUM CHLORIDE 0.9 % IV SOLN: Freq: Once | INTRAVENOUS | Status: AC

## 2019-12-19 MED ORDER — LORAZEPAM 2 MG/ML IJ SOLN
0.5000 mg | Freq: Once | INTRAMUSCULAR | Status: AC
  Administered 2019-12-19: 0.5 mg via INTRAVENOUS

## 2019-12-19 MED ORDER — HEPARIN SOD (PORK) LOCK FLUSH 100 UNIT/ML IV SOLN
500.0000 [IU] | Freq: Once | INTRAVENOUS | Status: AC | PRN
  Administered 2019-12-19: 500 [IU]

## 2019-12-19 MED ORDER — SODIUM CHLORIDE 0.9 % IV SOLN
45.0000 mg/m2 | Freq: Once | INTRAVENOUS | Status: AC
  Administered 2019-12-19: 84 mg via INTRAVENOUS
  Filled 2019-12-19: qty 14

## 2019-12-19 NOTE — Patient Instructions (Signed)
[place] at [facility] Discharge Instructions  You were seen today by Dr. Delton Coombes. He went over your recent results. You received your treatment today. Start drinking 5 cans of Ensure Plus after eating as much blended food as tolerated. You will be prescribed Magic Mouthwash to take 1 tablespoon, swish and swallow 20 minutes prior to eating to ease the trouble swallowing. Start taking 1/2 tablet of metoprolol twice daily. Dr. Delton Coombes will see you back in 1 week for labs and follow up.   Thank you for choosing [place] at [facility] to provide your oncology and hematology care.  To afford each patient quality time with our provider, please arrive at least 15 minutes before your scheduled appointment time.   If you have a lab appointment with the [place] please come in thru the Main Entrance and check in at the main information desk  You need to re-schedule your appointment should you arrive 10 or more minutes late.  We strive to give you quality time with our providers, and arriving late affects you and other patients whose appointments are after yours.  Also, if you no show three or more times for appointments you may be dismissed from the clinic at the providers discretion.     Again, thank you for choosing [facility].  Our hope is that these requests will decrease the amount of time that you wait before being seen by our physicians.       _____________________________________________________________  Should you have questions after your visit to [facility], please contact our office at [phone] between the hours of 8:00 a.m. and 4:30 p.m.  Voicemails left after 4:00 p.m. will not be returned until the following business day.  For prescription refill requests, have your pharmacy contact our office and allow 72 hours.    Cancer Center Support Programs:   > Cancer Support Group  2nd Tuesday of the  month 1pm-2pm, Journey Room

## 2019-12-19 NOTE — Progress Notes (Signed)
Reports feeling nervous and jittery after decadron IVPB complete.  Pt with visible tremors.  Dr. Delton Coombes notified and orders rec'd for Ativan 0.5mg  IV x 1 dose.   1315:  Pt resting comfortably in recliner with eyes closed.  In no apparent distress.  Tolerated infusions w/o adverse reaction.  Alert, in no distress.  Discharged ambulatory in c/o spouse in stable condition.

## 2019-12-19 NOTE — Patient Instructions (Signed)
[facility] at [facility] Discharge Instructions  Labs drawn from portacath today   Thank you for choosing [facility] at [facility] to provide your oncology and hematology care.  To afford each patient quality time with our provider, please arrive at least 15 minutes before your scheduled appointment time.   If you have a lab appointment with the Cancer Center please come in thru the Main Entrance and check in at the main information desk.  You need to re-schedule your appointment should you arrive 10 or more minutes late.  We strive to give you quality time with our providers, and arriving late affects you and other patients whose appointments are after yours.  Also, if you no show three or more times for appointments you may be dismissed from the clinic at the providers discretion.     Again, thank you for choosing [facility].  Our hope is that these requests will decrease the amount of time that you wait before being seen by our physicians.       _____________________________________________________________  Should you have questions after your visit to [facility], please contact our office at [phone] and follow the prompts.  Our office hours are 8:00 a.m. and 4:30 p.m. Monday - Friday.  Please note that voicemails left after 4:00 p.m. may not be returned until the following business day.  We are closed weekends and major holidays.  You do have access to a nurse 24-7, just call the main number to the clinic [phone] and do not press any options, hold on the line and a nurse will answer the phone.    For prescription refill requests, have your pharmacy contact our office and allow 72 hours.    Due to Covid, you will need to wear a mask upon entering the hospital. If you do not have a mask, a mask will be given to you at the Main Entrance upon arrival. For doctor visits, patients may have 1 support person age 18  or older with them. For treatment visits, patients can not have anyone with them due to social distancing guidelines and our immunocompromised population.     

## 2019-12-19 NOTE — Progress Notes (Signed)
Patient was assessed by Dr. Delton Coombes and labs have been reviewed.  Orders received for 1 liter normal saline over 2 hours in addition to treatment today.  Patient also has some radiation induced esophagitis.  Orders received for maalox with lidocaine, it has been sent to his pharmacy.  His blood pressure is low, orders received for him to take 1/2 pill twice daily.  Primary RN and pharmacy aware.   Okay to proceed with treatment today.

## 2019-12-19 NOTE — Progress Notes (Signed)
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  PRIOR THERAPY: None  NGS Results: Not done  CURRENT THERAPY: Carboplatin and paclitaxel weekly  BRIEF ONCOLOGIC HISTORY:  Oncology History  Squamous cell lung cancer, left ([place])  11/08/2019 Initial Diagnosis   Squamous cell lung cancer, left ([place])   11/08/2019 Cancer Staging   Staging form: Lung, AJCC 8th Edition - Clinical stage from 11/08/2019: Stage IIIB (cT4, cN2, cM0) - Signed by Alan Jack, MD on 11/08/2019   11/28/2019 -  Chemotherapy   The patient had palonosetron (ALOXI) injection 0.25 mg, 0.25 mg, Intravenous,  Once, 3 of 6 cycles Administration: 0.25 mg (11/28/2019), 0.25 mg (12/05/2019), 0.25 mg (12/12/2019) CARBOplatin (PARAPLATIN) 180 mg in sodium chloride 0.9 % 250 mL chemo infusion, 180 mg (100 % of original dose 176 mg), Intravenous,  Once, 3 of 6 cycles Dose modification:   (original dose 176 mg, Cycle 1),   (original dose 201.2 mg, Cycle 2),   (original dose 179.2 mg, Cycle 3),   (Cycle 4) Administration: 180 mg (11/28/2019), 200 mg (12/05/2019), 180 mg (12/12/2019) PACLitaxel (TAXOL) 84 mg in sodium chloride 0.9 % 250 mL chemo infusion (</= 80mg /m2), 45 mg/m2 = 84 mg, Intravenous,  Once, 3 of 6 cycles Administration: 84 mg (11/28/2019), 84 mg (12/05/2019), 84 mg (12/12/2019)  for chemotherapy treatment.      CANCER STAGING: Cancer Staging Squamous cell lung cancer, left [facility]) Staging form: Lung, AJCC 8th Edition - Clinical stage from 11/08/2019: Stage IIIB (cT4, cN2, cM0) - Signed by Alan Jack, MD on 11/08/2019   INTERVAL HISTORY:  Alan [name], a 69 y.o. male, returns for routine follow-up and consideration for next cycle of chemotherapy. [name] was last seen on 12/12/2019.  Due for cycle #4 of carboplatin and paclitaxel today.    Today he is accompanied by his wife. Overall, he tells me he has been feeling pretty well. He reports having pain when he swallows, but not at any other time. He is drinking 4 cans of Ensure Plus daily, though he eats half-portion to less due to pain with swallowing. He reports having diarrhea after the previous treatment and took Imodium which stopped it; he denies N/V, numbness or tingling. He takes Compazine once he develops nausea. He continues taking Hycodan daily.  Overall, he feels ready for next cycle of chemo today.    REVIEW OF SYSTEMS:  Review of Systems  Constitutional: Positive for appetite change (50%), fatigue (60%) and unexpected weight change (lost 3 lbs in 1 week).  HENT:   Positive for trouble swallowing.   Respiratory: Positive for cough (occasional) and shortness of breath (occasional).   Gastrointestinal: Positive for diarrhea (post-chemo) and nausea (occasional). Negative for vomiting.  Neurological: Positive for dizziness (occasional). Negative for numbness.  Psychiatric/Behavioral: Positive for sleep disturbance.    PAST MEDICAL/SURGICAL HISTORY:  Past Medical History:  Diagnosis Date  . Arthritis   . Hyperlipemia   . Hypertension   . Hyperthyroidism   . Myocardial infarction ([place])   . Paroxysmal atrial fibrillation ([facility])   . Port-A-Cath in place 11/14/2019  . Tachycardia    Past Surgical History:  Procedure Laterality Date  . CARDIAC CATHETERIZATION    . PORTACATH PLACEMENT Right 11/01/2019   Procedure: INSERTION PORT-A-CATH;  Surgeon: Alan Signs, MD;  Location: [facility];  Service: General;  Laterality:  Right;    SOCIAL HISTORY:  Social History   Socioeconomic History  . Marital status: Married    Spouse name: Not on file  . Number of children: 1  . Years of education: Not on file  . Highest education level: Not on file  Occupational History  . Occupation: retired  . Occupation: Glass blower/designer: [employer]  Tobacco Use  . Smoking status:  Former Smoker    Years: 45.00    Types: 82, Cigarettes    Quit date: 10/19/2014    Years since quitting: 5.1  . Smokeless tobacco: Never Used  Vaping Use  . Vaping Use: Never used  Substance and Sexual Activity  . Alcohol use: Never  . Drug use: No  . Sexual activity: Not on file  Other Topics Concern  . Not on file  Social History Narrative  . Not on file   Social Determinants of Health   Financial Resource Strain: Medium Risk  . Difficulty of Paying Living Expenses: Somewhat hard  Food Insecurity: No Food Insecurity  . Worried About Charity fundraiser in the Last Year: Never true  . Ran Out of Food in the Last Year: Never true  Transportation Needs: No Transportation Needs  . Lack of Transportation (Medical): No  . Lack of Transportation (Non-Medical): No  Physical Activity: Inactive  . Days of Exercise per Week: 0 days  . Minutes of Exercise per Session: 0 min  Stress: Stress Concern Present  . Feeling of Stress : Very much  Social Connections: Moderately Isolated  . Frequency of Communication with Friends and Family: More than three times a week  . Frequency of Social Gatherings with Friends and Family: More than three times a week  . Attends Religious Services: Never  . Active Member of Clubs or Organizations: No  . Attends Archivist Meetings: Never  . Marital Status: Married  Human resources officer Violence: Not At Risk  . Fear of Current or Ex-Partner: No  . Emotionally Abused: No  . Physically Abused: No  . Sexually Abused: No    FAMILY HISTORY:  Family History  Problem Relation Age of Onset  . Hypertension Father   . Dementia Father        died age 26  . Heart disease Father   . Heart failure Other     CURRENT MEDICATIONS:  Current Outpatient Medications  Medication Sig Dispense Refill  . acetaminophen (TYLENOL) 500 MG tablet Take 500 mg by mouth every 6 (six) hours as needed for moderate pain or headache.    . albuterol (PROVENTIL) (2.5  MG/3ML) 0.083% nebulizer solution Take 3 mLs (2.5 mg total) by nebulization every 6 (six) hours as needed for wheezing or shortness of breath. 75 mL 12  . ALPRAZolam (XANAX) 0.25 MG tablet Take 1 tablet (0.25 mg total) by mouth 3 (three) times daily as needed for anxiety. 30 tablet 0  . apixaban (ELIQUIS) 5 MG TABS tablet Take 1 tablet (5 mg total) by mouth 2 (two) times daily. 60 tablet 6  . CARBOPLATIN IV Inject into the vein once a week.    Marland Kitchen HYDROcodone-homatropine (HYCODAN) 5-1.5 MG/5ML syrup Take 15 mLs by mouth every 8 (eight) hours as needed for cough. 480 mL 0  . levofloxacin (LEVAQUIN) 500 MG tablet Take 500 mg by mouth daily.    Marland Kitchen lidocaine-prilocaine (EMLA) cream Apply a small amount to port a cath site and cover with plastic wrap 1 hour prior to chemotherapy appointments 30  g 3  . metoprolol tartrate (LOPRESSOR) 25 MG tablet Take 1 tablet (25 mg total) by mouth 2 (two) times daily. 180 tablet 3  . PACLitaxel (TAXOL IV) Inject into the vein once a week.    . prochlorperazine (COMPAZINE) 10 MG tablet Take 1 tablet (10 mg total) by mouth every 6 (six) hours as needed (Nausea or vomiting). 30 tablet 1  . alum & mag hydroxide-simeth (MAALOX MAX) 409-735-32 MG/5ML suspension Please take 1 tablespoon by mouth four times daily with meals. 480 mL 2   No current facility-administered medications for this visit.   Facility-Administered Medications Ordered in Other Visits  Medication Dose Route Frequency Provider Last Rate Last Admin  . 0.9 %  sodium chloride infusion   Intravenous Continuous Alan Jack, MD 500 mL/hr at 12/19/19 1015 New Bag at 12/19/19 1015    ALLERGIES:  No Known Allergies  PHYSICAL EXAM:  Performance status (ECOG): 1 - Symptomatic but completely ambulatory  Vitals:   12/19/19 0912  BP: 105/66  Pulse: 68  Resp: 18  Temp: (!) 97.1 F (36.2 C)  SpO2: 100%   Wt Readings from Last 3 Encounters:  12/19/19 167 lb 6.4 oz (75.9 kg)  12/12/19 170 lb 6.7 oz  (77.3 kg)  12/05/19 176 lb 3.2 oz (79.9 kg)   Physical Exam Vitals reviewed.  Constitutional:      Appearance: Normal appearance.  HENT:     Mouth/Throat:     Lips: No lesions.     Mouth: No oral lesions.     Dentition: No gum lesions.     Tongue: No lesions.     Palate: No mass.  Cardiovascular:     Rate and Rhythm: Normal rate and regular rhythm.     Pulses: Normal pulses.     Heart sounds: Normal heart sounds.  Pulmonary:     Effort: Pulmonary effort is normal.     Breath sounds: Normal breath sounds.  Chest:     Comments: Port-a-Cath in R chest Abdominal:     Palpations: Abdomen is soft.     Tenderness: There is no abdominal tenderness.  Musculoskeletal:     Right lower leg: No edema.     Left lower leg: No edema.  Neurological:     General: No focal deficit present.     Mental Status: He is alert and oriented to person, place, and time.  Psychiatric:        Mood and Affect: Mood normal.        Behavior: Behavior normal.     LABORATORY DATA:  I have reviewed the labs as listed.  CBC Latest Ref Rng & Units 12/19/2019 12/12/2019 12/05/2019  WBC 4.0 - 10.5 K/uL 3.9(L) 6.9 9.3  Hemoglobin 13.0 - 17.0 g/dL 11.3(L) 11.5(L) 12.3(L)  Hematocrit 39 - 52 % 34.2(L) 35.1(L) 37.5(L)  Platelets 150 - 400 K/uL 205 172 182   CMP Latest Ref Rng & Units 12/19/2019 12/12/2019 12/05/2019  Glucose 70 - 99 mg/dL 146(H) 223(H) 164(H)  BUN 8 - 23 mg/dL 30(H) 23 20  Creatinine 0.61 - 1.24 mg/dL 1.17 1.17 0.95  Sodium 135 - 145 mmol/L 131(L) 130(L) 137  Potassium 3.5 - 5.1 mmol/L 4.1 4.5 4.3  Chloride 98 - 111 mmol/L 98 95(L) 99  CO2 22 - 32 mmol/L 24 26 28   Calcium 8.9 - 10.3 mg/dL 8.6(L) 8.8(L) 9.1  Total Protein 6.5 - 8.1 g/dL 6.6 6.6 6.7  Total Bilirubin 0.3 - 1.2 mg/dL 0.7 1.2 0.6  Alkaline Phos 38 -  126 U/L 63 66 67  AST 15 - 41 U/L 26 19 20   ALT 0 - 44 U/L 28 19 21     DIAGNOSTIC IMAGING:  I have independently reviewed the scans and discussed with the patient. No results  found.   ASSESSMENT:  1. Left lung cancer: -Presentation to the ER on 10/06/2019 with cough and dizziness. -CT chest with contrast on 10/06/2019 showed large left suprahilar mass completely obstructing left upper lobe bronchus, measuring 7 x 5.3 x 5.8 cm. Mass extends into AP window and narrows the left mainstem bronchus. Small subpleural nodule in the left lower lobe measuring 4 mm indeterminate. AP window nodal mass measuring 3.4 cm. No supraclavicular adenopathy. Subcarinal enlarged lymph node measures 1.4 cm. -PET scan on 10/14/2019 shows large hypermetabolic left upper lobe mass surrounding the left hilum, extending into pleural surface not invading the chest wall. Direct invasion into the AP window and metastatic subcarinal lymph node, T4 N2 M0. 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 -10 pound weight loss in the last 6 months. Hoarseness for last 1 week. -MRI of the brain on 10/28/2019 with no evidence of metastasis. -Concurrent chemoradiation therapy started on 11/28/2019.  2. Social/family history: -He smoked 1 pack/day for 45 years, quit 5 years ago. -No family history of malignancies.   PLAN:  1.Stage III (T4 N2 M0) squamous cell carcinoma of the left lung: -He has tolerated last cycle reasonably well. -However he developed retrosternal pain on swallowing. -We will send a prescription for Maalox with lidocaine to be used every 4 hours as needed. -Reviewed his labs which showed white count 3.9 with ANC 3.3.  Platelet count was normal.  Albumin is low at 3.0.  Creatinine is stable at 1.17. -Proceed with next cycle of treatment today.  Reevaluate in 1 week.  2. Coughing spells: -Continue Hycodan twice daily as needed.  3. Anxiety: -Continue Xanax 0.25 mg twice daily as needed.  4. Atrial fibrillation: -Continue Eliquis 5 mg twice daily managed by Dr. Harl Bowie.  5. Nutrition: -He lost 3 pounds since  last week.  He is drinking 4 cans of Ensure Plus.  Only eating small portions of food. -Hopefully Maalox with lidocaine will help with swallowing process. -Recommended mechanical soft diet. -Recommend Ensure Plus 5 cans/day.  6.  Hypertension: -Blood pressure today is 105/66. -Decrease metoprolol 25 mg half tablet twice daily.   Orders placed this encounter:  No orders of the defined types were placed in this encounter.    Alan Jack, MD [place] [phone]   I, Milinda Antis, am acting as a scribe for Dr. Sanda Linger.  I, Alan Jack MD, have reviewed the above documentation for accuracy and completeness, and I agree with the above.

## 2019-12-20 ENCOUNTER — Ambulatory Visit: Payer: Medicare Other | Admitting: Physician Assistant

## 2019-12-22 ENCOUNTER — Ambulatory Visit
Admission: RE | Admit: 2019-12-22 | Discharge: 2019-12-22 | Disposition: A | Discharge status: Home / Self Care | Payer: Self-pay | Source: Ambulatory Visit | Attending: Hematology | Admitting: Hematology

## 2019-12-22 ENCOUNTER — Other Ambulatory Visit (HOSPITAL_COMMUNITY): Payer: Self-pay | Admitting: Hematology

## 2019-12-22 DIAGNOSIS — R918 Other nonspecific abnormal finding of lung field: Secondary | ICD-10-CM

## 2019-12-26 ENCOUNTER — Other Ambulatory Visit: Payer: Self-pay

## 2019-12-26 ENCOUNTER — Inpatient Hospital Stay (HOSPITAL_BASED_OUTPATIENT_CLINIC_OR_DEPARTMENT_OTHER): Payer: Medicare Other | Admitting: Hematology

## 2019-12-26 ENCOUNTER — Inpatient Hospital Stay (HOSPITAL_COMMUNITY): Payer: Medicare Other

## 2019-12-26 VITALS — BP 110/63 | HR 70 | Temp 96.9°F | Resp 18

## 2019-12-26 VITALS — BP 99/67 | HR 52 | Temp 97.2°F | Resp 18 | Wt 168.2 lb

## 2019-12-26 DIAGNOSIS — C3492 Malignant neoplasm of unspecified part of left bronchus or lung: Secondary | ICD-10-CM

## 2019-12-26 DIAGNOSIS — Z5111 Encounter for antineoplastic chemotherapy: Secondary | ICD-10-CM | POA: Diagnosis not present

## 2019-12-26 DIAGNOSIS — Z95828 Presence of other vascular implants and grafts: Secondary | ICD-10-CM

## 2019-12-26 LAB — COMPREHENSIVE METABOLIC PANEL
ALT: 21 U/L (ref 0–44)
AST: 23 U/L (ref 15–41)
Albumin: 3 g/dL — ABNORMAL LOW (ref 3.5–5.0)
Alkaline Phosphatase: 57 U/L (ref 38–126)
Anion gap: 7 (ref 5–15)
BUN: 18 mg/dL (ref 8–23)
CO2: 28 mmol/L (ref 22–32)
Calcium: 8.6 mg/dL — ABNORMAL LOW (ref 8.9–10.3)
Chloride: 98 mmol/L (ref 98–111)
Creatinine, Ser: 1.04 mg/dL (ref 0.61–1.24)
GFR, Estimated: >60 mL/min (ref >60)
Glucose, Bld: 155 mg/dL — ABNORMAL HIGH (ref 70–99)
Potassium: 4.5 mmol/L (ref 3.5–5.1)
Sodium: 133 mmol/L — ABNORMAL LOW (ref 135–145)
Total Bilirubin: 0.8 mg/dL (ref 0.3–1.2)
Total Protein: 6.1 g/dL — ABNORMAL LOW (ref 6.5–8.1)

## 2019-12-26 LAB — CBC WITH DIFFERENTIAL/PLATELET
Abs Immature Granulocytes: 0.01 10*3/uL (ref 0.00–0.07)
Basophils Absolute: 0 10*3/uL (ref 0.0–0.1)
Basophils Relative: 1 %
Eosinophils Absolute: 0 10*3/uL (ref 0.0–0.5)
Eosinophils Relative: 0 %
HCT: 31.8 % — ABNORMAL LOW (ref 39.0–52.0)
Hemoglobin: 10.2 g/dL — ABNORMAL LOW (ref 13.0–17.0)
Immature Granulocytes: 0 %
Lymphocytes Relative: 7 %
Lymphs Abs: 0.2 10*3/uL — ABNORMAL LOW (ref 0.7–4.0)
MCH: 28.3 pg (ref 26.0–34.0)
MCHC: 32.1 g/dL (ref 30.0–36.0)
MCV: 88.1 fL (ref 80.0–100.0)
Monocytes Absolute: 0.2 10*3/uL (ref 0.1–1.0)
Monocytes Relative: 9 %
Neutro Abs: 2.1 10*3/uL (ref 1.7–7.7)
Neutrophils Relative %: 83 %
Platelets: 140 10*3/uL — ABNORMAL LOW (ref 150–400)
RBC: 3.61 MIL/uL — ABNORMAL LOW (ref 4.22–5.81)
RDW: 14 % (ref 11.5–15.5)
WBC: 2.6 10*3/uL — ABNORMAL LOW (ref 4.0–10.5)
nRBC: 0 % (ref 0.0–0.2)

## 2019-12-26 MED ORDER — SODIUM CHLORIDE 0.9% FLUSH
10.0000 mL | INTRAVENOUS | Status: DC | PRN
  Administered 2019-12-26: 10 mL

## 2019-12-26 MED ORDER — HEPARIN SOD (PORK) LOCK FLUSH 100 UNIT/ML IV SOLN
500.0000 [IU] | Freq: Once | INTRAVENOUS | Status: AC | PRN
  Administered 2019-12-26: 500 [IU]

## 2019-12-26 MED ORDER — DIPHENHYDRAMINE HCL 50 MG/ML IJ SOLN
25.0000 mg | Freq: Once | INTRAMUSCULAR | Status: AC
  Administered 2019-12-26: 25 mg via INTRAVENOUS
  Filled 2019-12-26: qty 1

## 2019-12-26 MED ORDER — PALONOSETRON HCL INJECTION 0.25 MG/5ML
0.2500 mg | Freq: Once | INTRAVENOUS | Status: AC
  Administered 2019-12-26: 0.25 mg via INTRAVENOUS
  Filled 2019-12-26: qty 5

## 2019-12-26 MED ORDER — FAMOTIDINE IN NACL 20-0.9 MG/50ML-% IV SOLN
20.0000 mg | Freq: Once | INTRAVENOUS | Status: AC
  Administered 2019-12-26: 20 mg via INTRAVENOUS
  Filled 2019-12-26: qty 50

## 2019-12-26 MED ORDER — SODIUM CHLORIDE 0.9 % IV SOLN: Freq: Once | INTRAVENOUS | Status: AC

## 2019-12-26 MED ORDER — SODIUM CHLORIDE 0.9 % IV SOLN
190.0000 mg | Freq: Once | INTRAVENOUS | Status: AC
  Administered 2019-12-26: 190 mg via INTRAVENOUS
  Filled 2019-12-26: qty 19

## 2019-12-26 MED ORDER — CYANOCOBALAMIN 1000 MCG/ML IJ SOLN
INTRAMUSCULAR | Status: AC
  Filled 2019-12-26: qty 1

## 2019-12-26 MED ORDER — SODIUM CHLORIDE 0.9 % IV SOLN
45.0000 mg/m2 | Freq: Once | INTRAVENOUS | Status: AC
  Administered 2019-12-26: 84 mg via INTRAVENOUS
  Filled 2019-12-26: qty 14

## 2019-12-26 MED ORDER — OCTREOTIDE ACETATE 30 MG IM KIT
PACK | INTRAMUSCULAR | Status: AC
  Filled 2019-12-26: qty 1

## 2019-12-26 MED ORDER — SODIUM CHLORIDE 0.9 % IV SOLN
20.0000 mg | Freq: Once | INTRAVENOUS | Status: AC
  Administered 2019-12-26: 20 mg via INTRAVENOUS
  Filled 2019-12-26: qty 20

## 2019-12-26 NOTE — Patient Instructions (Signed)
[facility] at [facility] Discharge Instructions  Labs drawn from portacath today   Thank you for choosing [facility] at [facility] to provide your oncology and hematology care.  To afford each patient quality time with our provider, please arrive at least 15 minutes before your scheduled appointment time.   If you have a lab appointment with the Cancer Center please come in thru the Main Entrance and check in at the main information desk.  You need to re-schedule your appointment should you arrive 10 or more minutes late.  We strive to give you quality time with our providers, and arriving late affects you and other patients whose appointments are after yours.  Also, if you no show three or more times for appointments you may be dismissed from the clinic at the providers discretion.     Again, thank you for choosing [facility].  Our hope is that these requests will decrease the amount of time that you wait before being seen by our physicians.       _____________________________________________________________  Should you have questions after your visit to [facility], please contact our office at [phone] and follow the prompts.  Our office hours are 8:00 a.m. and 4:30 p.m. Monday - Friday.  Please note that voicemails left after 4:00 p.m. may not be returned until the following business day.  We are closed weekends and major holidays.  You do have access to a nurse 24-7, just call the main number to the clinic [phone] and do not press any options, hold on the line and a nurse will answer the phone.    For prescription refill requests, have your pharmacy contact our office and allow 72 hours.    Due to Covid, you will need to wear a mask upon entering the hospital. If you do not have a mask, a mask will be given to you at the Main Entrance upon arrival. For doctor visits, patients may have 1 support person age 18  or older with them. For treatment visits, patients can not have anyone with them due to social distancing guidelines and our immunocompromised population.     

## 2019-12-26 NOTE — Patient Instructions (Signed)
[facility] Discharge Instructions for Patients Receiving Chemotherapy  Today you received the following chemotherapy agents   To help prevent nausea and vomiting after your treatment, we encourage you to take your nausea medication   If you develop nausea and vomiting that is not controlled by your nausea medication, call the clinic.   BELOW ARE SYMPTOMS THAT SHOULD BE REPORTED IMMEDIATELY:  *FEVER GREATER THAN 100.5 F  *CHILLS WITH OR WITHOUT FEVER  NAUSEA AND VOMITING THAT IS NOT CONTROLLED WITH YOUR NAUSEA MEDICATION  *UNUSUAL SHORTNESS OF BREATH  *UNUSUAL BRUISING OR BLEEDING  TENDERNESS IN MOUTH AND THROAT WITH OR WITHOUT PRESENCE OF ULCERS  *URINARY PROBLEMS  *BOWEL PROBLEMS  UNUSUAL RASH Items with * indicate a potential emergency and should be followed up as soon as possible.  Feel free to call the clinic should you have any questions or concerns. The clinic phone number is [phone].  Please show the CHEMO ALERT CARD at check-in to the Emergency Department and triage nurse.   

## 2019-12-26 NOTE — Progress Notes (Signed)
Patient presents today for treatment and follow up visit with Dr. Delton Coombes. Labs reviewed by MD.   Message received from DWilson Rn/ Dr. Delton Coombes to proceed with treatment.   Treatment given today per MD orders. Tolerated infusion without adverse affects. Vital signs stable. No complaints at this time. Discharged from clinic ambulatory in stable condition. Alert and oriented x 3. F/U with [facility] as scheduled.

## 2019-12-26 NOTE — Progress Notes (Signed)
Patient was assessed by Dr. Delton Coombes and labs have been reviewed.  WBC 2.6, Patient is okay to proceed with treatment today. Patient had tremors with benadryl last treatment, we will decrease it to 25 mg today.  Primary RN and pharmacy aware.

## 2019-12-26 NOTE — Progress Notes (Signed)
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25 mg (12/05/2019), 0.25 mg (12/12/2019), 0.25 mg (12/19/2019) CARBOplatin (PARAPLATIN) 180 mg in sodium chloride 0.9 % 250 mL chemo infusion, 180 mg (100 % of original dose 176 mg), Intravenous,  Once, 4 of 6 cycles Dose modification:   (original dose 176 mg, Cycle 1),   (original dose 201.2 mg, Cycle 2),   (original dose 179.2 mg, Cycle 3),   (original dose 179.2 mg, Cycle 4) Administration: 180 mg (11/28/2019), 200 mg (12/05/2019), 180 mg (12/12/2019), 180 mg (12/19/2019) PACLitaxel (TAXOL) 84 mg in sodium chloride 0.9 % 250 mL chemo infusion (</= 80mg /m2), 45 mg/m2 = 84 mg, Intravenous,  Once, 4 of 6 cycles Administration: 84 mg (11/28/2019), 84 mg (12/05/2019), 84 mg (12/12/2019), 84 mg (12/19/2019)  for chemotherapy treatment.      CANCER STAGING: Cancer Staging Squamous cell lung cancer, left [facility]) Staging form: Lung, AJCC 8th Edition - Clinical stage from 11/08/2019: Stage IIIB (cT4, cN2, cM0) - Signed by Derek Jack, MD on 11/08/2019   INTERVAL HISTORY:  Alan [name], a 69 y.o. male, returns for routine follow-up and consideration for next cycle of chemotherapy. [name]  was last seen on 12/19/2019.  Due for cycle #5 of carboplatin and paclitaxel today.   Today he is accompanied by his wife. Overall, he tells me he has been feeling okay. He continues having a cough and is bringing up sputum intermittently. He was prescribed Levaquin by Dr. Adella Nissen for possible pneumonia and reports that his cough is not as deep as before; he finished it on 11/5. He reports having nausea and is taking Compazine but denies vomiting. His diarrhea is well-controlled with Imodium. His appetite is good and his trouble swallowing is improved with Magic Mouthwash. He denies leg swelling.  Overall, he feels ready for next cycle of chemo today.    REVIEW OF SYSTEMS:  Review of Systems  Constitutional: Positive for appetite change (50%) and fatigue (60%).  Respiratory: Positive for cough (intermittent sputum) and shortness of breath.   Cardiovascular: Negative for leg swelling.  Gastrointestinal: Positive for diarrhea and nausea.  Psychiatric/Behavioral: Positive for depression and sleep disturbance (improved). The patient is nervous/anxious.   All other systems reviewed and are negative.   PAST MEDICAL/SURGICAL HISTORY:  Past Medical History:  Diagnosis Date  . Arthritis   . Hyperlipemia   . Hypertension   . Hyperthyroidism   . Myocardial infarction ([place])   . Paroxysmal atrial fibrillation ([facility])   . Port-A-Cath in place 11/14/2019  . Tachycardia    Past Surgical History:  Procedure Laterality Date  . CARDIAC CATHETERIZATION    . PORTACATH PLACEMENT Right 11/01/2019   Procedure: INSERTION PORT-A-CATH;  Surgeon: Aviva Signs, MD;  Location:  [facility];  Service: General;  Laterality: Right;    SOCIAL HISTORY:  Social History   Socioeconomic History  . Marital status: Married    Spouse name: Not on file  . Number of children: 1  . Years of education: Not on file  . Highest education level: Not on file  Occupational History  . Occupation: retired  . Occupation: Education officer, museum: [employer]  Tobacco Use  . Smoking status: Former Smoker    Years: 45.00    Types: 41, Cigarettes    Quit date: 10/19/2014    Years since quitting: 5.1  . Smokeless tobacco: Never Used  Vaping Use  . Vaping Use: Never used  Substance and Sexual Activity  . Alcohol use: Never  . Drug use: No  . Sexual activity: Not on file  Other Topics Concern  . Not on file  Social History Narrative  . Not on file   Social Determinants of Health   Financial Resource Strain: Medium Risk  . Difficulty of Paying Living Expenses: Somewhat hard  Food Insecurity: No Food Insecurity  . Worried About Charity fundraiser in the Last Year: Never true  . Ran Out of Food in the Last Year: Never true  Transportation Needs: No Transportation Needs  . Lack of Transportation (Medical): No  . Lack of Transportation (Non-Medical): No  Physical Activity: Inactive  . Days of Exercise per Week: 0 days  . Minutes of Exercise per Session: 0 min  Stress: Stress Concern Present  . Feeling of Stress : Very much  Social Connections: Moderately Isolated  . Frequency of Communication with Friends and Family: More than three times a week  . Frequency of Social Gatherings with Friends and Family: More than three times a week  . Attends Religious Services: Never  . Active Member of Clubs or Organizations: No  . Attends Archivist Meetings: Never  . Marital Status: Married  Human resources officer Violence: Not At Risk  . Fear of Current or Ex-Partner: No  . Emotionally Abused: No  . Physically Abused: No  . Sexually Abused: No    FAMILY HISTORY:  Family History  Problem Relation Age of Onset  . Hypertension Father   . Dementia Father        died age 26  . Heart disease Father   . Heart failure Other     CURRENT MEDICATIONS:  Current Outpatient Medications  Medication Sig Dispense Refill  . acetaminophen (TYLENOL) 500 MG tablet Take 500 mg by mouth every 6 (six) hours as needed for  moderate pain or headache.    . albuterol (PROVENTIL) (2.5 MG/3ML) 0.083% nebulizer solution Take 3 mLs (2.5 mg total) by nebulization every 6 (six) hours as needed for wheezing or shortness of breath. 75 mL 12  . ALPRAZolam (XANAX) 0.25 MG tablet Take 1 tablet (0.25 mg total) by mouth 3 (three) times daily as needed for anxiety. 30 tablet 0  . alum & mag hydroxide-simeth (MAALOX MAX) 147-829-56 MG/5ML suspension Please take 1 tablespoon by mouth four times daily with meals. 480 mL 2  . apixaban (ELIQUIS) 5 MG TABS tablet Take 1 tablet (5 mg total) by mouth 2 (two) times daily. 60 tablet 6  . CARBOPLATIN IV Inject into the vein once a week.    Marland Kitchen HYDROcodone-homatropine (HYCODAN) 5-1.5 MG/5ML syrup Take 15 mLs by mouth every 8 (eight) hours as needed for cough. 480 mL 0  . levofloxacin (LEVAQUIN) 500 MG tablet Take 500  mg by mouth daily.    . metoprolol tartrate (LOPRESSOR) 25 MG tablet Take 1 tablet (25 mg total) by mouth 2 (two) times daily. 180 tablet 3  . PACLitaxel (TAXOL IV) Inject into the vein once a week.    . lidocaine-prilocaine (EMLA) cream Apply a small amount to port a cath site and cover with plastic wrap 1 hour prior to chemotherapy appointments (Patient not taking: Reported on 12/26/2019) 30 g 3  . prochlorperazine (COMPAZINE) 10 MG tablet Take 1 tablet (10 mg total) by mouth every 6 (six) hours as needed (Nausea or vomiting). (Patient not taking: Reported on 12/26/2019) 30 tablet 1   No current facility-administered medications for this visit.    ALLERGIES:  No Known Allergies  PHYSICAL EXAM:  Performance status (ECOG): 1 - Symptomatic but completely ambulatory  Vitals:   12/26/19 0922  BP: 99/67  Pulse: (!) 52  Resp: 18  Temp: (!) 97.2 F (36.2 C)  SpO2: 97%   Wt Readings from Last 3 Encounters:  12/26/19 168 lb 3.2 oz (76.3 kg)  12/19/19 167 lb 6.4 oz (75.9 kg)  12/12/19 170 lb 6.7 oz (77.3 kg)   Physical Exam Vitals reviewed.  Constitutional:      Appearance:  Normal appearance.  Cardiovascular:     Rate and Rhythm: Normal rate and regular rhythm.     Pulses: Normal pulses.     Heart sounds: Normal heart sounds.  Pulmonary:     Effort: Pulmonary effort is normal.     Breath sounds: Normal breath sounds.  Chest:     Comments: Port-a-Cath in R chest Musculoskeletal:     Right lower leg: No edema.     Left lower leg: No edema.  Neurological:     General: No focal deficit present.     Mental Status: He is alert and oriented to person, place, and time.  Psychiatric:        Mood and Affect: Mood normal.        Behavior: Behavior normal.     LABORATORY DATA:  I have reviewed the labs as listed.  CBC Latest Ref Rng & Units 12/26/2019 12/19/2019 12/12/2019  WBC 4.0 - 10.5 K/uL 2.6(L) 3.9(L) 6.9  Hemoglobin 13.0 - 17.0 g/dL 10.2(L) 11.3(L) 11.5(L)  Hematocrit 39 - 52 % 31.8(L) 34.2(L) 35.1(L)  Platelets 150 - 400 K/uL 140(L) 205 172   CMP Latest Ref Rng & Units 12/26/2019 12/19/2019 12/12/2019  Glucose 70 - 99 mg/dL 155(H) 146(H) 223(H)  BUN 8 - 23 mg/dL 18 30(H) 23  Creatinine 0.61 - 1.24 mg/dL 1.04 1.17 1.17  Sodium 135 - 145 mmol/L 133(L) 131(L) 130(L)  Potassium 3.5 - 5.1 mmol/L 4.5 4.1 4.5  Chloride 98 - 111 mmol/L 98 98 95(L)  CO2 22 - 32 mmol/L 28 24 26   Calcium 8.9 - 10.3 mg/dL 8.6(L) 8.6(L) 8.8(L)  Total Protein 6.5 - 8.1 g/dL 6.1(L) 6.6 6.6  Total Bilirubin 0.3 - 1.2 mg/dL 0.8 0.7 1.2  Alkaline Phos 38 - 126 U/L 57 63 66  AST 15 - 41 U/L 23 26 19   ALT 0 - 44 U/L 21 28 19     DIAGNOSTIC IMAGING:  I have independently reviewed the scans and discussed with the patient. No results found.   ASSESSMENT:  1. Left lung cancer: -Presentation to the ER on 10/06/2019 with cough and dizziness. -CT chest with contrast on 10/06/2019 showed large left suprahilar mass completely obstructing left upper lobe bronchus, measuring 7 x 5.3 x 5.8 cm. Mass  extends into AP window and narrows the left mainstem bronchus. Small subpleural nodule in  the left lower lobe measuring 4 mm indeterminate. AP window nodal mass measuring 3.4 cm. No supraclavicular adenopathy. Subcarinal enlarged lymph node measures 1.4 cm. -PET scan on 10/14/2019 shows large hypermetabolic left upper lobe mass surrounding the left hilum, extending into pleural surface not invading the chest wall. Direct invasion into the AP window and metastatic subcarinal lymph node, T4 N2 M0. 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 -10 pound weight loss in the last 6 months. Hoarseness for last 1 week. -MRI of the brain on 10/28/2019 with no evidence of metastasis. -Concurrent chemoradiation therapy started on 11/28/2019.  2. Social/family history: -He smoked 1 pack/day for 45 years, quit 5 years ago. -No family history of malignancies.   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  Left lung has infiltrates and new pleural effusion. -He was treated with Levaquin.  He reported improvement in cough. -Reviewed labs from today.  White count is 2.6 with ANC of 2.1.  Platelets are low at 140.  Albumin is 3.0 and rest of LFTs are normal. -He will proceed with weekly treatment today.  Plan to see him back in 1 week.  2. Coughing spells: -Continue Hycodan twice daily as needed.  3. Anxiety: -Continue Xanax 0.25 mg twice daily as needed.  4. Atrial fibrillation: -Continue Eliquis 5 mg twice daily.  5. Nutrition: -Continue Ensure plus 5 cans/day.  6.  Hypertension: -Continue metoprolol 25 mg half tablet twice daily.  Blood pressure today is 99/67.   Orders placed this encounter:  No orders of the defined types were placed in this encounter.    Derek Jack, MD [place] [phone]   I, Milinda Antis, am acting as a scribe for Dr. Sanda Linger.  I, Derek Jack MD, have reviewed the above documentation for accuracy and completeness, and I agree with the above.

## 2019-12-26 NOTE — Patient Instructions (Signed)
[place] at [facility] Discharge Instructions  You were seen today by Dr. Delton Coombes. He went over your recent results. You received your treatment today. Dr. Delton Coombes will see you back in 1 week for labs and follow up.   Thank you for choosing [place] at [facility] to provide your oncology and hematology care.  To afford each patient quality time with our provider, please arrive at least 15 minutes before your scheduled appointment time.   If you have a lab appointment with the [place] please come in thru the Main Entrance and check in at the main information desk  You need to re-schedule your appointment should you arrive 10 or more minutes late.  We strive to give you quality time with our providers, and arriving late affects you and other patients whose appointments are after yours.  Also, if you no show three or more times for appointments you may be dismissed from the clinic at the providers discretion.     Again, thank you for choosing [facility].  Our hope is that these requests will decrease the amount of time that you wait before being seen by our physicians.       _____________________________________________________________  Should you have questions after your visit to [facility], please contact our office at [phone] between the hours of 8:00 a.m. and 4:30 p.m.  Voicemails left after 4:00 p.m. will not be returned until the following business day.  For prescription refill requests, have your pharmacy contact our office and allow 72 hours.    Cancer Center Support Programs:   > Cancer Support Group  2nd Tuesday of the month 1pm-2pm, Journey Room

## 2019-12-30 ENCOUNTER — Encounter: Payer: Self-pay | Admitting: Student

## 2019-12-30 ENCOUNTER — Ambulatory Visit (INDEPENDENT_AMBULATORY_CARE_PROVIDER_SITE_OTHER): Payer: Medicare Other | Admitting: Student

## 2019-12-30 ENCOUNTER — Other Ambulatory Visit: Payer: Self-pay

## 2019-12-30 VITALS — BP 120/62 | HR 120 | Ht 63.0 in | Wt 166.6 lb

## 2019-12-30 DIAGNOSIS — C3492 Malignant neoplasm of unspecified part of left bronchus or lung: Secondary | ICD-10-CM | POA: Diagnosis not present

## 2019-12-30 DIAGNOSIS — I4819 Other persistent atrial fibrillation: Secondary | ICD-10-CM

## 2019-12-30 DIAGNOSIS — I10 Essential (primary) hypertension: Secondary | ICD-10-CM | POA: Diagnosis not present

## 2019-12-30 DIAGNOSIS — I2510 Atherosclerotic heart disease of native coronary artery without angina pectoris: Secondary | ICD-10-CM | POA: Diagnosis not present

## 2019-12-30 MED ORDER — METOPROLOL TARTRATE 25 MG PO TABS: ORAL_TABLET | ORAL | 3 refills | Status: DC

## 2019-12-30 NOTE — Progress Notes (Signed)
Cardiology Office Note    Date:  12/30/2019   ID:  Alan [name], Alan [name] 1950/09/07, MRN [id number]  PCP:  Merryl Hacker, No  Cardiologist: Carlyle Dolly, MD    Chief Complaint  Patient presents with  . Hospitalization Follow-up    History of Present Illness:    Alan [name] is a 69 y.o. male with past medical history of HTN, Hyperthyroidism, mild nonobstructive CAD by cath in 2008, lung cancer (diagnosed in 09/2019 - undergoing chemotherapy and radiation), remote history of paroxysmal atrial fibrillation with recurrence in 10/2019 and prior tobacco use who presents to the office today for hospital follow-up.  He was recently at [facility] on 11/01/2019 for planned Port-A-Cath insertion and during the procedure went into atrial fibrillation with RVR. He was completely asymptomatic but rates were variable from the 110's to 130's. He had recently underwent a lung biopsy and was not started on anticoagulation at that time. He was placed on Lopressor 37.5 mg twice daily for rate control. He did present to the office for a nurse visit on 11/04/2019 and his rates had significantly improved. He was started on Eliquis 5 mg twice daily for anticoagulation and an echocardiogram was ordered. His echocardiogram showed a preserved EF of 65 to 70% with no regional wall motion abnormalities. He did have moderate LVH and a small pericardial effusion but no significant valve abnormalities.  Oncology did reach out in the interim reporting he was "feeling off", therefore Lopressor was reduced to 25 mg twice daily.  In talking the patient and his wife today, he reports feeling fatigued since undergoing chemotherapy and radiation. He has dyspnea on exertion but denies any associated chest pain or palpitations. He is asymptomatic with his arrhythmia. By review of notes, Lopressor was reduced to 12.5 mg twice daily by Oncology in the interim due to hypotension.  Past Medical History:  Diagnosis Date  . Arthritis   .  Hyperlipemia   . Hypertension   . Hyperthyroidism   . Myocardial infarction ([place])   . Paroxysmal atrial fibrillation (HCC)   . Port-A-Cath in place 11/14/2019  . Tachycardia     Past Surgical History:  Procedure Laterality Date  . CARDIAC CATHETERIZATION    . PORTACATH PLACEMENT Right 11/01/2019   Procedure: INSERTION PORT-A-CATH;  Surgeon: Aviva Signs, MD;  Location: [facility];  Service: General;  Laterality: Right;    Current Medications: Outpatient Medications Prior to Visit  Medication Sig Dispense Refill  . acetaminophen (TYLENOL) 500 MG tablet Take 500 mg by mouth every 6 (six) hours as needed for moderate pain or headache.    . albuterol (PROVENTIL) (2.5 MG/3ML) 0.083% nebulizer solution Take 3 mLs (2.5 mg total) by nebulization every 6 (six) hours as needed for wheezing or shortness of breath. 75 mL 12  . ALPRAZolam (XANAX) 0.25 MG tablet Take 1 tablet (0.25 mg total) by mouth 3 (three) times daily as needed for anxiety. 30 tablet 0  . alum & mag hydroxide-simeth (MAALOX MAX) 010-272-53 MG/5ML suspension Please take 1 tablespoon by mouth four times daily with meals. 480 mL 2  . apixaban (ELIQUIS) 5 MG TABS tablet Take 1 tablet (5 mg total) by mouth 2 (two) times daily. 60 tablet 6  . CARBOPLATIN IV Inject into the vein once a week.    Marland Kitchen HYDROcodone-homatropine (HYCODAN) 5-1.5 MG/5ML syrup Take 15 mLs by mouth every 8 (eight) hours as needed for cough. 480 mL 0  . levofloxacin (LEVAQUIN) 500 MG tablet Take 500 mg by mouth  daily.    . lidocaine-prilocaine (EMLA) cream Apply a small amount to port a cath site and cover with plastic wrap 1 hour prior to chemotherapy appointments 30 g 3  . PACLitaxel (TAXOL IV) Inject into the vein once a week.    . prochlorperazine (COMPAZINE) 10 MG tablet Take 1 tablet (10 mg total) by mouth every 6 (six) hours as needed (Nausea or vomiting). 30 tablet 1  . metoprolol tartrate (LOPRESSOR) 25 MG tablet Take 12.5 mg by mouth 2 (two) times daily.    .  metoprolol tartrate (LOPRESSOR) 25 MG tablet Take 1 tablet (25 mg total) by mouth 2 (two) times daily. 180 tablet 3   No facility-administered medications prior to visit.     Allergies:   Patient has no known allergies.   Social History   Socioeconomic History  . Marital status: Married    Spouse name: Not on file  . Number of children: 1  . Years of education: Not on file  . Highest education level: Not on file  Occupational History  . Occupation: retired  . Occupation: Glass blower/designer: [employer]  Tobacco Use  . Smoking status: Former Smoker    Years: 45.00    Types: 78, Cigarettes    Quit date: 10/19/2014    Years since quitting: 5.2  . Smokeless tobacco: Never Used  Vaping Use  . Vaping Use: Never used  Substance and Sexual Activity  . Alcohol use: Never  . Drug use: No  . Sexual activity: Not on file  Other Topics Concern  . Not on file  Social History Narrative  . Not on file   Social Determinants of Health   Financial Resource Strain: Medium Risk  . Difficulty of Paying Living Expenses: Somewhat hard  Food Insecurity: No Food Insecurity  . Worried About Charity fundraiser in the Last Year: Never true  . Ran Out of Food in the Last Year: Never true  Transportation Needs: No Transportation Needs  . Lack of Transportation (Medical): No  . Lack of Transportation (Non-Medical): No  Physical Activity: Inactive  . Days of Exercise per Week: 0 days  . Minutes of Exercise per Session: 0 min  Stress: Stress Concern Present  . Feeling of Stress : Very much  Social Connections: Moderately Isolated  . Frequency of Communication with Friends and Family: More than three times a week  . Frequency of Social Gatherings with Friends and Family: More than three times a week  . Attends Religious Services: Never  . Active Member of Clubs or Organizations: No  . Attends Archivist Meetings: Never  . Marital Status: Married     Family History:  The  patient's family history includes Dementia in his father; Heart disease in his father; Heart failure in an other family member; Hypertension in his father.   Review of Systems:   Please see the history of present illness.     General:  No chills, fever, or night sweats. Positive for fatigue and weight loss.  Cardiovascular:  No chest pain, edema, orthopnea, palpitations, paroxysmal nocturnal dyspnea. Positive for dyspnea on exertion.  Dermatological: No rash, lesions/masses Respiratory: No cough, Positive for dyspnea. Urologic: No hematuria, dysuria Abdominal:   No nausea, vomiting, diarrhea, bright red blood per rectum, melena, or hematemesis Neurologic:  No visual changes, wkns, changes in mental status. All other systems reviewed and are otherwise negative except as noted above.   Physical Exam:    VS:  BP  120/62   Pulse (!) 120   Ht 5\' 3"  (1.6 m)   Wt 166 lb 9.6 oz (75.6 kg)   SpO2 98%   BMI 29.51 kg/m    General: Well developed, well nourished,male appearing in no acute distress. Head: Normocephalic, atraumatic. Neck: No carotid bruits. JVD not elevated.  Lungs: Respirations regular and unlabored, scattered rhonchi.  Heart: Irregularly irregular. No S3 or S4.  No murmur, no rubs, or gallops appreciated. Abdomen: Appears non-distended. No obvious abdominal masses. Msk:  Strength and tone appear normal for age. No obvious joint deformities or effusions. Extremities: No clubbing or cyanosis. No lower extremity edema.  Distal pedal pulses are 2+ bilaterally. Neuro: Alert and oriented X 3. Moves all extremities spontaneously. No focal deficits noted. Psych:  Responds to questions appropriately with a normal affect. Skin: No rashes or lesions noted  Wt Readings from Last 3 Encounters:  12/30/19 166 lb 9.6 oz (75.6 kg)  12/26/19 168 lb 3.2 oz (76.3 kg)  12/19/19 167 lb 6.4 oz (75.9 kg)     Studies/Labs Reviewed:   EKG:  EKG is not ordered today.    Recent Labs: 11/01/2019:  Magnesium 1.9; TSH 0.488 12/26/2019: ALT 21; BUN 18; Creatinine, Ser 1.04; Hemoglobin 10.2; Platelets 140; Potassium 4.5; Sodium 133   Lipid Panel No results found for: CHOL, TRIG, HDL, CHOLHDL, VLDL, LDLCALC, LDLDIRECT  Additional studies/ records that were reviewed today include:   Echocardiogram: 10/2019 IMPRESSIONS    1. Left ventricular ejection fraction, by estimation, is 65 to 70%. The  left ventricle has normal function. The left ventricle has no regional  wall motion abnormalities. There is moderate left ventricular hypertrophy.  Left ventricular diastolic  parameters are indeterminate.  2. Right ventricular systolic function is normal. The right ventricular  size is normal.  3. A small pericardial effusion is present. The pericardial effusion is  circumferential. There is no evidence of cardiac tamponade.  4. The mitral valve is normal in structure. Trivial mitral valve  regurgitation. No evidence of mitral stenosis.  5. The aortic valve has an indeterminant number of cusps. Aortic valve  regurgitation is not visualized. No aortic stenosis is present.  6. The inferior vena cava is normal in size with greater than 50%  respiratory variability, suggesting right atrial pressure of 3 mmHg.   Assessment:    1. Persistent atrial fibrillation ([place])   2. Essential hypertension   3. Coronary artery disease involving native coronary artery of native heart without angina pectoris   4. Squamous cell lung cancer, left (HCC)      Plan:   In order of problems listed above:  1. Persistent Atrial Fibrillation - He has overall been asymptomatic with his arrhythmia and denies any palpitations or chest pain. He does have baseline dyspnea on exertion in the setting of lung cancer for which he is currently undergoing treatment. - He was previously on Lopressor 37.5 mg twice daily and rates were well-controlled but he did develop hypotension with this by review of Oncology notes  which led to dose reduction of to 25 mg BID. This was further reduced to 12.5 mg BID in the interim but unfortunately he is now tachycardic. Heart rate was initially in the 120's and rechecked and at 110 bpm. Will titrate Lopressor to 25mg  in AM/12.5mg  in PM. He is at the hospital several days per week for chemotherapy and radiation, therefore will continue to follow vitals during these times. If rates remain elevated, would titrate to 25 mg twice daily  or consider switching to Toprol-XL. - His CHA2DS2-VASc Score is 3 (HTN, Vascular, Age). Continue Eliquis 5 mg twice daily for anticoagulation. He denies any evidence of active bleeding.  2. HTN - BP has actually been soft during prior Oncology visits with SBP in the 90's at times. BP is at 120/62 during today's visit. Will titrate Lopressor as outlined above in the setting of tachycardia.  3. CAD - He has a history of nonobstructive CAD by prior catheterization in 2008. He has baseline dyspnea on exertion but denies any chest pain. Continue beta-blocker therapy. He is not on ASA given the need for anticoagulation. He would benefit from checking an FLP in the future as he is not currently on statin therapy as he was previously not followed by a PCP prior to his lung cancer diagnosis.  4. Lung Cancer - He was diagnosed with bronchogenic carcinoma in 09/2019. Currently undergoing chemotherapy and radiation.   Medication Adjustments/Labs and Tests Ordered: Current medicines are reviewed at length with the patient today.  Concerns regarding medicines are outlined above.  Medication changes, Labs and Tests ordered today are listed in the Patient Instructions below. Patient Instructions  Medication Instructions:  Your physician has recommended you make the following change in your medication:  Change Lopressor to 25 mg (1 tablet) in the AM and 12.5 mg (1/2 tablet) in the PM.   *If you need a refill on your cardiac medications before your next  appointment, please call your pharmacy*   Lab Work: NONE   If you have labs (blood work) drawn today and your tests are completely normal, you will receive your results only by: Marland Kitchen MyChart Message (if you have MyChart) OR . A paper copy in the mail If you have any lab test that is abnormal or we need to change your treatment, we will call you to review the results.   Testing/Procedures: NONE    Follow-Up: At [facility], you and your health needs are our priority.  As part of our continuing mission to provide you with exceptional heart care, we have created designated Provider Care Teams.  These Care Teams include your primary Cardiologist (physician) and Advanced Practice Providers (APPs -  Physician Assistants and Nurse Practitioners) who all work together to provide you with the care you need, when you need it.  We recommend signing up for the patient portal called "MyChart".  Sign up information is provided on this After Visit Summary.  MyChart is used to connect with patients for Virtual Visits (Telemedicine).  Patients are able to view lab/test results, encounter notes, upcoming appointments, etc.  Non-urgent messages can be sent to your provider as well.   To learn more about what you can do with MyChart, go to [email or web address].    Your next appointment:   2-3 month(s)  The format for your next appointment:   In Person  Provider:   Carlyle Dolly, MD or Bernerd Pho, PA-C   Other Instructions Thank you for choosing [facility]!     Signed, Erma Heritage, PA-C  12/30/2019 3:11 PM    [facility] [address] [place], [place] [address] Phone: [phone] Fax: [phone]

## 2019-12-30 NOTE — Patient Instructions (Signed)
Medication Instructions:  Your physician has recommended you make the following change in your medication:  Change Lopressor to 25 mg (1 tablet) in the AM and 12.5 mg (1/2 tablet) in the PM.   *If you need a refill on your cardiac medications before your next appointment, please call your pharmacy*   Lab Work: NONE   If you have labs (blood work) drawn today and your tests are completely normal, you will receive your results only by: Marland Kitchen MyChart Message (if you have MyChart) OR . A paper copy in the mail If you have any lab test that is abnormal or we need to change your treatment, we will call you to review the results.   Testing/Procedures: NONE    Follow-Up: At [facility], you and your health needs are our priority.  As part of our continuing mission to provide you with exceptional heart care, we have created designated Provider Care Teams.  These Care Teams include your primary Cardiologist (physician) and Advanced Practice Providers (APPs -  Physician Assistants and Nurse Practitioners) who all work together to provide you with the care you need, when you need it.  We recommend signing up for the patient portal called "MyChart".  Sign up information is provided on this After Visit Summary.  MyChart is used to connect with patients for Virtual Visits (Telemedicine).  Patients are able to view lab/test results, encounter notes, upcoming appointments, etc.  Non-urgent messages can be sent to your provider as well.   To learn more about what you can do with MyChart, go to [email or web address].    Your next appointment:   2-3 month(s)  The format for your next appointment:   In Person  Provider:   Carlyle Dolly, MD or Bernerd Pho, PA-C   Other Instructions Thank you for choosing [facility]!

## 2020-01-02 ENCOUNTER — Inpatient Hospital Stay (HOSPITAL_BASED_OUTPATIENT_CLINIC_OR_DEPARTMENT_OTHER): Payer: Medicare Other | Admitting: Hematology

## 2020-01-02 ENCOUNTER — Inpatient Hospital Stay (HOSPITAL_COMMUNITY): Payer: Medicare Other

## 2020-01-02 ENCOUNTER — Encounter (HOSPITAL_COMMUNITY): Payer: Self-pay | Admitting: Hematology

## 2020-01-02 ENCOUNTER — Other Ambulatory Visit: Payer: Self-pay

## 2020-01-02 VITALS — BP 98/66 | HR 51 | Temp 97.1°F | Resp 18 | Wt 164.0 lb

## 2020-01-02 VITALS — BP 101/62 | HR 80 | Temp 97.0°F | Resp 18

## 2020-01-02 DIAGNOSIS — C3492 Malignant neoplasm of unspecified part of left bronchus or lung: Secondary | ICD-10-CM

## 2020-01-02 DIAGNOSIS — Z5111 Encounter for antineoplastic chemotherapy: Secondary | ICD-10-CM | POA: Diagnosis not present

## 2020-01-02 DIAGNOSIS — Z95828 Presence of other vascular implants and grafts: Secondary | ICD-10-CM

## 2020-01-02 LAB — CBC WITH DIFFERENTIAL/PLATELET
Abs Immature Granulocytes: 0.01 10*3/uL (ref 0.00–0.07)
Basophils Absolute: 0 10*3/uL (ref 0.0–0.1)
Basophils Relative: 1 %
Eosinophils Absolute: 0 10*3/uL (ref 0.0–0.5)
Eosinophils Relative: 1 %
HCT: 31.2 % — ABNORMAL LOW (ref 39.0–52.0)
Hemoglobin: 10.2 g/dL — ABNORMAL LOW (ref 13.0–17.0)
Immature Granulocytes: 0 %
Lymphocytes Relative: 9 %
Lymphs Abs: 0.3 10*3/uL — ABNORMAL LOW (ref 0.7–4.0)
MCH: 28.7 pg (ref 26.0–34.0)
MCHC: 32.7 g/dL (ref 30.0–36.0)
MCV: 87.9 fL (ref 80.0–100.0)
Monocytes Absolute: 0.2 10*3/uL (ref 0.1–1.0)
Monocytes Relative: 8 %
Neutro Abs: 2.2 10*3/uL (ref 1.7–7.7)
Neutrophils Relative %: 81 %
Platelets: 160 10*3/uL (ref 150–400)
RBC: 3.55 MIL/uL — ABNORMAL LOW (ref 4.22–5.81)
RDW: 14.6 % (ref 11.5–15.5)
WBC: 2.7 10*3/uL — ABNORMAL LOW (ref 4.0–10.5)
nRBC: 0 % (ref 0.0–0.2)

## 2020-01-02 LAB — COMPREHENSIVE METABOLIC PANEL
ALT: 21 U/L (ref 0–44)
AST: 23 U/L (ref 15–41)
Albumin: 3.3 g/dL — ABNORMAL LOW (ref 3.5–5.0)
Alkaline Phosphatase: 65 U/L (ref 38–126)
Anion gap: 6 (ref 5–15)
BUN: 24 mg/dL — ABNORMAL HIGH (ref 8–23)
CO2: 27 mmol/L (ref 22–32)
Calcium: 8.7 mg/dL — ABNORMAL LOW (ref 8.9–10.3)
Chloride: 99 mmol/L (ref 98–111)
Creatinine, Ser: 1.18 mg/dL (ref 0.61–1.24)
GFR, Estimated: >60 mL/min (ref >60)
Glucose, Bld: 123 mg/dL — ABNORMAL HIGH (ref 70–99)
Potassium: 3.9 mmol/L (ref 3.5–5.1)
Sodium: 132 mmol/L — ABNORMAL LOW (ref 135–145)
Total Bilirubin: 0.6 mg/dL (ref 0.3–1.2)
Total Protein: 6.6 g/dL (ref 6.5–8.1)

## 2020-01-02 MED ORDER — SODIUM CHLORIDE 0.9 % IV SOLN
45.0000 mg/m2 | Freq: Once | INTRAVENOUS | Status: AC
  Administered 2020-01-02: 84 mg via INTRAVENOUS
  Filled 2020-01-02: qty 14

## 2020-01-02 MED ORDER — DIPHENHYDRAMINE HCL 50 MG/ML IJ SOLN
50.0000 mg | Freq: Once | INTRAMUSCULAR | Status: AC
  Administered 2020-01-02: 50 mg via INTRAVENOUS
  Filled 2020-01-02: qty 1

## 2020-01-02 MED ORDER — SODIUM CHLORIDE 0.9% FLUSH
10.0000 mL | INTRAVENOUS | Status: DC | PRN
  Administered 2020-01-02: 10 mL

## 2020-01-02 MED ORDER — HEPARIN SOD (PORK) LOCK FLUSH 100 UNIT/ML IV SOLN
500.0000 [IU] | Freq: Once | INTRAVENOUS | Status: AC | PRN
  Administered 2020-01-02: 500 [IU]

## 2020-01-02 MED ORDER — SODIUM CHLORIDE 0.9 % IV SOLN: Freq: Once | INTRAVENOUS | Status: AC

## 2020-01-02 MED ORDER — PALONOSETRON HCL INJECTION 0.25 MG/5ML
0.2500 mg | Freq: Once | INTRAVENOUS | Status: AC
  Administered 2020-01-02: 0.25 mg via INTRAVENOUS
  Filled 2020-01-02: qty 5

## 2020-01-02 MED ORDER — FAMOTIDINE IN NACL 20-0.9 MG/50ML-% IV SOLN
20.0000 mg | Freq: Once | INTRAVENOUS | Status: AC
  Administered 2020-01-02: 20 mg via INTRAVENOUS
  Filled 2020-01-02: qty 50

## 2020-01-02 MED ORDER — SODIUM CHLORIDE 0.9 % IV SOLN
20.0000 mg | Freq: Once | INTRAVENOUS | Status: AC
  Administered 2020-01-02: 20 mg via INTRAVENOUS
  Filled 2020-01-02: qty 20

## 2020-01-02 MED ORDER — SODIUM CHLORIDE 0.9 % IV SOLN
178.2000 mg | Freq: Once | INTRAVENOUS | Status: AC
  Administered 2020-01-02: 180 mg via INTRAVENOUS
  Filled 2020-01-02: qty 18

## 2020-01-02 NOTE — Progress Notes (Signed)
Patient was assessed by Dr. Katragadda and labs have been reviewed.  Patient is okay to proceed with treatment today. Primary RN and pharmacy aware.   

## 2020-01-02 NOTE — Patient Instructions (Signed)
[facility] Discharge Instructions for Patients Receiving Chemotherapy   Beginning January 23rd 2017 lab work for the [employer] will be done in the  Main lab at [facility] on 1st floor. If you have a lab appointment with the [facility] please come in thru the  Main Entrance and check in at the main information desk   Today you received the following chemotherapy agents Taxol and Carboplatin. Follow-up as scheduled  To help prevent nausea and vomiting after your treatment, we encourage you to take your nausea medication   If you develop nausea and vomiting, or diarrhea that is not controlled by your medication, call the clinic.  The clinic phone number is [phone]. Office hours are Monday-Friday 8:30am-5:00pm.  BELOW ARE SYMPTOMS THAT SHOULD BE REPORTED IMMEDIATELY:  *FEVER GREATER THAN 101.0 F  *CHILLS WITH OR WITHOUT FEVER  NAUSEA AND VOMITING THAT IS NOT CONTROLLED WITH YOUR NAUSEA MEDICATION  *UNUSUAL SHORTNESS OF BREATH  *UNUSUAL BRUISING OR BLEEDING  TENDERNESS IN MOUTH AND THROAT WITH OR WITHOUT PRESENCE OF ULCERS  *URINARY PROBLEMS  *BOWEL PROBLEMS  UNUSUAL RASH Items with * indicate a potential emergency and should be followed up as soon as possible. If you have an emergency after office hours please contact your primary care physician or go to the nearest emergency department.  Please call the clinic during office hours if you have any questions or concerns.   You may also contact the Patient Navigator at [phone] should you have any questions or need assistance in obtaining follow up care.      Resources For Cancer Patients and their Caregivers ? American Cancer Society: Can assist with transportation, wigs, general needs, runs Look Good Feel Better.        (908) 356-3905 ? Cancer Care: Provides financial assistance, online support groups, medication/co-pay assistance.  1-800-813-HOPE 5642290630) ? [place] Assists [place] cancer patients and their families through emotional , educational and financial support.  734-228-9578 ? [place] DSS Where to apply for food stamps, Medicaid and utility assistance. (402) 702-6627 ? [facility]: Transportation to medical appointments. 916 013 3838 ? Social Security Administration: May apply for disability if have a Stage IV cancer. (864)841-1077 276-082-2552 ? [employer], Disability and Transit Services: Assists with nutrition, care and transit needs. 737-482-5566

## 2020-01-02 NOTE — Patient Instructions (Signed)
[place] at [facility] Discharge Instructions  You were seen today by Dr. Delton Coombes. He went over your recent results. You received your final treatment today. Drink at least 5 cans of Ensure daily to maintain your weight. You will be scheduled for a CT scan of your chest before your next visit. Dr. Delton Coombes will see you back in 3 weeks for labs and starting immunotherapy.   Thank you for choosing [facility] at [facility] to provide your oncology and hematology care.  To afford each patient quality time with our provider, please arrive at least 15 minutes before your scheduled appointment time.   If you have a lab appointment with the [facility] please come in thru the Main Entrance and check in at the main information desk  You need to re-schedule your appointment should you arrive 10 or more minutes late.  We strive to give you quality time with our providers, and arriving late affects you and other patients whose appointments are after yours.  Also, if you no show three or more times for appointments you may be dismissed from the clinic at the providers discretion.     Again, thank you for choosing [facility].  Our hope is that these requests will decrease the amount of time that you wait before being seen by our physicians.       _____________________________________________________________  Should you have questions after your visit to [facility], please contact our office at [phone] between the hours of 8:00 a.m. and 4:30 p.m.  Voicemails left after 4:00 p.m. will not be returned until the following business day.  For prescription refill requests, have your pharmacy contact our office and allow 72 hours.    Cancer Center Support Programs:   > Cancer Support Group  2nd Tuesday of the month 1pm-2pm, Journey Room

## 2020-01-02 NOTE — Progress Notes (Signed)
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  PRIOR THERAPY: None  NGS Results: Not done  CURRENT THERAPY: Concurrent chemoradiation with carboplatin and paclitaxel weekly  BRIEF ONCOLOGIC HISTORY:  Oncology History  Squamous cell lung cancer, left ([place])  11/08/2019 Initial Diagnosis   Squamous cell lung cancer, left ([place])   11/08/2019 Cancer Staging   Staging form: Lung, AJCC 8th Edition - Clinical stage from 11/08/2019: Stage IIIB (cT4, cN2, cM0) - Signed by Derek Jack, MD on 11/08/2019   11/28/2019 -  Chemotherapy   The patient had palonosetron (ALOXI) injection 0.25 mg, 0.25 mg, Intravenous,  Once, 5 of 6 cycles Administration: 0.25 mg (11/28/2019), 0.25 mg (12/05/2019), 0.25 mg (12/12/2019), 0.25 mg (12/19/2019), 0.25 mg (12/26/2019) CARBOplatin (PARAPLATIN) 180 mg in sodium chloride 0.9 % 250 mL chemo infusion, 180 mg (100 % of original dose 176 mg), Intravenous,  Once, 5 of 6 cycles Dose modification:   (original dose 176 mg, Cycle 1),   (original dose 201.2 mg, Cycle 2),   (original dose 179.2 mg, Cycle 3),   (original dose 179.2 mg, Cycle 4),   (original dose 195.4 mg, Cycle 5) Administration: 180 mg (11/28/2019), 200 mg (12/05/2019), 180 mg (12/12/2019), 180 mg (12/19/2019), 190 mg (12/26/2019) PACLitaxel (TAXOL) 84 mg in sodium chloride 0.9 % 250 mL chemo infusion (</= 80mg /m2), 45 mg/m2 = 84 mg, Intravenous,  Once, 5 of 6 cycles Administration: 84 mg (11/28/2019), 84 mg (12/05/2019), 84 mg (12/12/2019), 84 mg (12/19/2019), 84 mg (12/26/2019)  for chemotherapy treatment.      CANCER STAGING: Cancer Staging Squamous cell lung cancer, left [facility]) Staging form: Lung, AJCC 8th Edition - Clinical stage from 11/08/2019: Stage IIIB (cT4, cN2, cM0) - Signed by Derek Jack, MD on 11/08/2019   INTERVAL  HISTORY:  [name], a 69 y.o. male, returns for routine follow-up and consideration for final cycle of chemotherapy. Alan [name] was last seen on 12/26/2019.  Due for cycle #6 of carboplatin and paclitaxel today.   Today he is accompanied by his wife. Overall, he tells me he has been feeling pretty well, as good as last week. He developed an itchy rash on both forearms immediately after taking Benadryl last week during chemo and has been applying Benadryl cream to the skin. His cough has improved, though he continues coughing up sputum and it has intermittent specks of blood. He denies having difficulty swallowing. He is drinking 3-4 cans of Ensure daily; he also eats small meals 3-4 times a day. He continues taking Eliquis and he denies having leg swelling. His last radiation session is on Friday, 11/19.  Overall, he feels ready for final cycle of chemo today.    REVIEW OF SYSTEMS:  Review of Systems  Constitutional: Positive for appetite change (50%) and fatigue (50%).  HENT:   Positive for trouble swallowing.   Respiratory: Positive for cough (intermittent sputum w/ blood) and shortness of breath.   Cardiovascular: Negative for leg swelling.  Gastrointestinal: Positive for diarrhea and nausea.  Skin: Positive for rash (itchy rash on dorsal forearms).  Psychiatric/Behavioral: Positive for depression. The patient is nervous/anxious.   All other systems reviewed and are negative.   PAST MEDICAL/SURGICAL HISTORY:  Past Medical History:  Diagnosis Date   Arthritis    Hyperlipemia    Hypertension    Hyperthyroidism  Myocardial infarction [facility])    Paroxysmal atrial fibrillation ([place])    Port-A-Cath in place 11/14/2019   Tachycardia    Past Surgical History:  Procedure Laterality Date   CARDIAC CATHETERIZATION     PORTACATH PLACEMENT Right 11/01/2019   Procedure: INSERTION PORT-A-CATH;  Surgeon: Aviva Signs, MD;  Location: [facility];  Service: General;  Laterality:  Right;    SOCIAL HISTORY:  Social History   Socioeconomic History   Marital status: Married    Spouse name: Not on file   Number of children: 1   Years of education: Not on file   Highest education level: Not on file  Occupational History   Occupation: retired   Occupation: Glass blower/designer: [employer]  Tobacco Use   Smoking status: Former Smoker    Years: 45.00    Types: Cigars, Cigarettes    Quit date: 10/19/2014    Years since quitting: 5.2   Smokeless tobacco: Never Used  Vaping Use   Vaping Use: Never used  Substance and Sexual Activity   Alcohol use: Never   Drug use: No   Sexual activity: Not on file  Other Topics Concern   Not on file  Social History Narrative   Not on file   Social Determinants of Health   Financial Resource Strain: Medium Risk   Difficulty of Paying Living Expenses: Somewhat hard  Food Insecurity: No Food Insecurity   Worried About Charity fundraiser in the Last Year: Never true   [place] in the Last Year: Never true  Transportation Needs: No Transportation Needs   Lack of Transportation (Medical): No   Lack of Transportation (Non-Medical): No  Physical Activity: Inactive   Days of Exercise per Week: 0 days   Minutes of Exercise per Session: 0 min  Stress: Stress Concern Present   Feeling of Stress : Very much  Social Connections: Moderately Isolated   Frequency of Communication with Friends and Family: More than three times a week   Frequency of Social Gatherings with Friends and Family: More than three times a week   Attends Religious Services: Never   Marine scientist or Organizations: No   Attends Music therapist: Never   Marital Status: Married  Human resources officer Violence: Not At Risk   Fear of Current or Ex-Partner: No   Emotionally Abused: No   Physically Abused: No   Sexually Abused: No    FAMILY HISTORY:  Family History  Problem Relation Age of Onset    Hypertension Father    Dementia Father        died age 35   Heart disease Father    Heart failure Other     CURRENT MEDICATIONS:  Current Outpatient Medications  Medication Sig Dispense Refill   acetaminophen (TYLENOL) 500 MG tablet Take 500 mg by mouth every 6 (six) hours as needed for moderate pain or headache.     albuterol (PROVENTIL) (2.5 MG/3ML) 0.083% nebulizer solution Take 3 mLs (2.5 mg total) by nebulization every 6 (six) hours as needed for wheezing or shortness of breath. 75 mL 12   ALPRAZolam (XANAX) 0.25 MG tablet Take 1 tablet (0.25 mg total) by mouth 3 (three) times daily as needed for anxiety. 30 tablet 0   alum & mag hydroxide-simeth (MAALOX MAX) 503-546-56 MG/5ML suspension Please take 1 tablespoon by mouth four times daily with meals. 480 mL 2   apixaban (ELIQUIS) 5 MG TABS tablet Take 1 tablet (5 mg total)  by mouth 2 (two) times daily. 60 tablet 6   CARBOPLATIN IV Inject into the vein once a week.     HYDROcodone-homatropine (HYCODAN) 5-1.5 MG/5ML syrup Take 15 mLs by mouth every 8 (eight) hours as needed for cough. 480 mL 0   lidocaine-prilocaine (EMLA) cream Apply a small amount to port a cath site and cover with plastic wrap 1 hour prior to chemotherapy appointments 30 g 3   metoprolol tartrate (LOPRESSOR) 25 MG tablet Take one tablet(25 mg )  in the AM and take 1/2 Tablet (12.5mg ) in the PM 135 tablet 3   PACLitaxel (TAXOL IV) Inject into the vein once a week.     prochlorperazine (COMPAZINE) 10 MG tablet Take 1 tablet (10 mg total) by mouth every 6 (six) hours as needed (Nausea or vomiting). 30 tablet 1   No current facility-administered medications for this visit.    ALLERGIES:  No Known Allergies  PHYSICAL EXAM:  Performance status (ECOG): 1 - Symptomatic but completely ambulatory  Vitals:   01/02/20 0936  BP: 98/66  Pulse: (!) 51  Resp: 18  Temp: (!) 97.1 F (36.2 C)  SpO2: 100%   Wt Readings from Last 3 Encounters:  01/02/20 164 lb  (74.4 kg)  12/30/19 166 lb 9.6 oz (75.6 kg)  12/26/19 168 lb 3.2 oz (76.3 kg)   Physical Exam Vitals reviewed.  Constitutional:      Appearance: Normal appearance.  Cardiovascular:     Rate and Rhythm: Normal rate and regular rhythm.     Pulses: Normal pulses.     Heart sounds: Normal heart sounds.  Pulmonary:     Effort: Pulmonary effort is normal.     Breath sounds: Normal breath sounds.  Musculoskeletal:     Right lower leg: No edema.     Left lower leg: No edema.  Skin:    Findings: Rash present. Rash is papular (papular rash on dorsal surfaces of forearms).  Neurological:     General: No focal deficit present.     Mental Status: He is alert and oriented to person, place, and time.  Psychiatric:        Mood and Affect: Mood normal.        Behavior: Behavior normal.     LABORATORY DATA:  I have reviewed the labs as listed.  CBC Latest Ref Rng & Units 01/02/2020 12/26/2019 12/19/2019  WBC 4.0 - 10.5 K/uL 2.7(L) 2.6(L) 3.9(L)  Hemoglobin 13.0 - 17.0 g/dL 10.2(L) 10.2(L) 11.3(L)  Hematocrit 39 - 52 % 31.2(L) 31.8(L) 34.2(L)  Platelets 150 - 400 K/uL 160 140(L) 205   CMP Latest Ref Rng & Units 01/02/2020 12/26/2019 12/19/2019  Glucose 70 - 99 mg/dL 123(H) 155(H) 146(H)  BUN 8 - 23 mg/dL 24(H) 18 30(H)  Creatinine 0.61 - 1.24 mg/dL 1.18 1.04 1.17  Sodium 135 - 145 mmol/L 132(L) 133(L) 131(L)  Potassium 3.5 - 5.1 mmol/L 3.9 4.5 4.1  Chloride 98 - 111 mmol/L 99 98 98  CO2 22 - 32 mmol/L 27 28 24   Calcium 8.9 - 10.3 mg/dL 8.7(L) 8.6(L) 8.6(L)  Total Protein 6.5 - 8.1 g/dL 6.6 6.1(L) 6.6  Total Bilirubin 0.3 - 1.2 mg/dL 0.6 0.8 0.7  Alkaline Phos 38 - 126 U/L 65 57 63  AST 15 - 41 U/L 23 23 26   ALT 0 - 44 U/L 21 21 28     DIAGNOSTIC IMAGING:  I have independently reviewed the scans and discussed with the patient. No results found.   ASSESSMENT:  1.  Left lung cancer: -Presentation to the ER on 10/06/2019 with cough and dizziness. -CT chest with contrast on 10/06/2019  showed large left suprahilar mass completely obstructing left upper lobe bronchus, measuring 7 x 5.3 x 5.8 cm. Mass extends into AP window and narrows the left mainstem bronchus. Small subpleural nodule in the left lower lobe measuring 4 mm indeterminate. AP window nodal mass measuring 3.4 cm. No supraclavicular adenopathy. Subcarinal enlarged lymph node measures 1.4 cm. -PET scan on 10/14/2019 shows large hypermetabolic left upper lobe mass surrounding the left hilum, extending into pleural surface not invading the chest wall. Direct invasion into the AP window and metastatic subcarinal lymph node, T4 N2 M0. 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 -10 pound weight loss in the last 6 months. Hoarseness for last 1 week. -MRI of the brain on 10/28/2019 with no evidence of metastasis. -Concurrent chemoradiation therapy started on 11/28/2019.  2. Social/family history: -He smoked 1 pack/day for 45 years, quit 5 years ago. -No family history of malignancies.   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 -He has intermittent cough but overall improved. -He will finish radiation therapy on 01/06/2020. -CBC today shows white count 2.7 with normal ANC.  Platelet count is 160.  LFTs show low albumin of 3.3. -He will proceed with his final cycle of chemotherapy today. -We will plan to repeat CT of the chest prior to next visit in 3 weeks. -If he gets at least a partial response, plan is to initiate durvalumab consolidation 4 weeks after completion of radiation therapy.  2. Coughing spells: -Continue Hycodan twice daily as needed.  3. Anxiety: -Continue Xanax 0.25 mg twice daily as needed.  4. Atrial fibrillation: -Continue Eliquis 5 mg twice daily.  5. Nutrition: -He is drinking Ensure up to 4 cans/day.  I have told him to  increase it to 5 cans/day.  6. Hypertension: -He is taking metoprolol 25 mg in the morning and 12.5 mg in the evening.  Blood pressure today is 98/66.   Orders placed this encounter:  No orders of the defined types were placed in this encounter.    Derek Jack, MD [place] [phone]   I, Milinda Antis, am acting as a scribe for Dr. Sanda Linger.  I, Derek Jack MD, have reviewed the above documentation for accuracy and completeness, and I agree with the above.

## 2020-01-02 NOTE — Patient Instructions (Signed)
[facility] at [facility] Discharge Instructions  Labs drawn from portacath today   Thank you for choosing [facility] at [facility] to provide your oncology and hematology care.  To afford each patient quality time with our provider, please arrive at least 15 minutes before your scheduled appointment time.   If you have a lab appointment with the Cancer Center please come in thru the Main Entrance and check in at the main information desk.  You need to re-schedule your appointment should you arrive 10 or more minutes late.  We strive to give you quality time with our providers, and arriving late affects you and other patients whose appointments are after yours.  Also, if you no show three or more times for appointments you may be dismissed from the clinic at the providers discretion.     Again, thank you for choosing [facility].  Our hope is that these requests will decrease the amount of time that you wait before being seen by our physicians.       _____________________________________________________________  Should you have questions after your visit to [facility], please contact our office at [phone] and follow the prompts.  Our office hours are 8:00 a.m. and 4:30 p.m. Monday - Friday.  Please note that voicemails left after 4:00 p.m. may not be returned until the following business day.  We are closed weekends and major holidays.  You do have access to a nurse 24-7, just call the main number to the clinic [phone] and do not press any options, hold on the line and a nurse will answer the phone.    For prescription refill requests, have your pharmacy contact our office and allow 72 hours.    Due to Covid, you will need to wear a mask upon entering the hospital. If you do not have a mask, a mask will be given to you at the Main Entrance upon arrival. For doctor visits, patients may have 1 support person age 18  or older with them. For treatment visits, patients can not have anyone with them due to social distancing guidelines and our immunocompromised population.     

## 2020-01-02 NOTE — Progress Notes (Signed)
1135 Labs reviewed with and pt seen by Dr. Delton Coombes and pt approved for Taxol and Carboplatin infusions today per MD          [name] tolerated Taxol and Carboplatin infusions well without complaints or incident. VSS upon discharge. Pt discharged self ambulatory in satisfactory condition

## 2020-01-05 ENCOUNTER — Other Ambulatory Visit (HOSPITAL_COMMUNITY): Payer: Self-pay

## 2020-01-05 DIAGNOSIS — C3492 Malignant neoplasm of unspecified part of left bronchus or lung: Secondary | ICD-10-CM

## 2020-01-05 MED ORDER — HYDROCODONE-HOMATROPINE 5-1.5 MG/5ML PO SYRP: 15.0000 mL | ORAL_SOLUTION | Freq: Three times a day (TID) | ORAL | 0 refills | Status: DC | PRN

## 2020-01-09 ENCOUNTER — Encounter (HOSPITAL_COMMUNITY): Payer: Self-pay

## 2020-01-09 ENCOUNTER — Other Ambulatory Visit: Payer: Self-pay

## 2020-01-09 ENCOUNTER — Inpatient Hospital Stay (HOSPITAL_COMMUNITY): Payer: Medicare Other

## 2020-01-09 VITALS — BP 100/63 | HR 112 | Temp 97.2°F | Resp 18

## 2020-01-09 DIAGNOSIS — Z5111 Encounter for antineoplastic chemotherapy: Secondary | ICD-10-CM | POA: Diagnosis not present

## 2020-01-09 DIAGNOSIS — E860 Dehydration: Secondary | ICD-10-CM

## 2020-01-09 DIAGNOSIS — C3492 Malignant neoplasm of unspecified part of left bronchus or lung: Secondary | ICD-10-CM

## 2020-01-09 LAB — COMPREHENSIVE METABOLIC PANEL
ALT: 39 U/L (ref 0–44)
AST: 38 U/L (ref 15–41)
Albumin: 3.3 g/dL — ABNORMAL LOW (ref 3.5–5.0)
Alkaline Phosphatase: 65 U/L (ref 38–126)
Anion gap: 9 (ref 5–15)
BUN: 25 mg/dL — ABNORMAL HIGH (ref 8–23)
CO2: 26 mmol/L (ref 22–32)
Calcium: 8.9 mg/dL (ref 8.9–10.3)
Chloride: 100 mmol/L (ref 98–111)
Creatinine, Ser: 1.25 mg/dL — ABNORMAL HIGH (ref 0.61–1.24)
GFR, Estimated: >60 mL/min (ref >60)
Glucose, Bld: 143 mg/dL — ABNORMAL HIGH (ref 70–99)
Potassium: 4.3 mmol/L (ref 3.5–5.1)
Sodium: 135 mmol/L (ref 135–145)
Total Bilirubin: 0.5 mg/dL (ref 0.3–1.2)
Total Protein: 6.5 g/dL (ref 6.5–8.1)

## 2020-01-09 LAB — MAGNESIUM: Magnesium: 1.8 mg/dL (ref 1.7–2.4)

## 2020-01-09 LAB — CBC WITH DIFFERENTIAL/PLATELET
Abs Immature Granulocytes: 0.02 10*3/uL (ref 0.00–0.07)
Basophils Absolute: 0 10*3/uL (ref 0.0–0.1)
Basophils Relative: 1 %
Eosinophils Absolute: 0 10*3/uL (ref 0.0–0.5)
Eosinophils Relative: 1 %
HCT: 29.9 % — ABNORMAL LOW (ref 39.0–52.0)
Hemoglobin: 9.6 g/dL — ABNORMAL LOW (ref 13.0–17.0)
Immature Granulocytes: 1 %
Lymphocytes Relative: 11 %
Lymphs Abs: 0.3 10*3/uL — ABNORMAL LOW (ref 0.7–4.0)
MCH: 28.6 pg (ref 26.0–34.0)
MCHC: 32.1 g/dL (ref 30.0–36.0)
MCV: 89 fL (ref 80.0–100.0)
Monocytes Absolute: 0.3 10*3/uL (ref 0.1–1.0)
Monocytes Relative: 12 %
Neutro Abs: 2.2 10*3/uL (ref 1.7–7.7)
Neutrophils Relative %: 74 %
Platelets: 190 10*3/uL (ref 150–400)
RBC: 3.36 MIL/uL — ABNORMAL LOW (ref 4.22–5.81)
RDW: 14.9 % (ref 11.5–15.5)
WBC: 2.9 10*3/uL — ABNORMAL LOW (ref 4.0–10.5)
nRBC: 0.7 % — ABNORMAL HIGH (ref 0.0–0.2)

## 2020-01-09 MED ORDER — HEPARIN SOD (PORK) LOCK FLUSH 100 UNIT/ML IV SOLN
500.0000 [IU] | Freq: Once | INTRAVENOUS | Status: AC | PRN
  Administered 2020-01-09: 500 [IU]

## 2020-01-09 MED ORDER — SODIUM CHLORIDE 0.9 % IV SOLN
Freq: Once | INTRAVENOUS | Status: AC
  Filled 2020-01-09: qty 10

## 2020-01-09 MED ORDER — SODIUM CHLORIDE 0.9% FLUSH
10.0000 mL | Freq: Once | INTRAVENOUS | Status: AC | PRN
  Administered 2020-01-09: 10 mL

## 2020-01-09 NOTE — Progress Notes (Signed)
Hydration given today per MD orders.  Tolerated infusion without adverse affects.  Vital signs stable.  No complaints at this time.  Discharge from clinic ambulatory in stable condition.  Alert and oriented X 3.  Follow up with [facility] as scheduled.

## 2020-01-09 NOTE — Progress Notes (Signed)
Patient presents today for blood work and house fluids. Patient has complaints of diarrhea and feeling week per patient's words. Patient denies pain today. Patient states, " I went to [facility] yesterday and I felt like the walls were closing in on me." Patient states he has been unable to increase fluids and what he does ingest comes immediately out. Patient did not take any Imodium. Patient teaching performed and hydrating and using anti diarrhea medications.

## 2020-01-09 NOTE — Patient Instructions (Signed)
[facility] Discharge Instructions for Patients Receiving Chemotherapy  Today you received the following hydration  To help prevent nausea and vomiting after your treatment, we encourage you to take your nausea medication   If you develop nausea and vomiting that is not controlled by your nausea medication, call the clinic.   BELOW ARE SYMPTOMS THAT SHOULD BE REPORTED IMMEDIATELY:  *FEVER GREATER THAN 100.5 F  *CHILLS WITH OR WITHOUT FEVER  NAUSEA AND VOMITING THAT IS NOT CONTROLLED WITH YOUR NAUSEA MEDICATION  *UNUSUAL SHORTNESS OF BREATH  *UNUSUAL BRUISING OR BLEEDING  TENDERNESS IN MOUTH AND THROAT WITH OR WITHOUT PRESENCE OF ULCERS  *URINARY PROBLEMS  *BOWEL PROBLEMS  UNUSUAL RASH Items with * indicate a potential emergency and should be followed up as soon as possible.  Feel free to call the clinic should you have any questions or concerns. The clinic phone number is [phone].  Please show the [facility] at check-in to the Emergency Department and triage nurse.

## 2020-01-10 ENCOUNTER — Other Ambulatory Visit (HOSPITAL_COMMUNITY): Payer: Self-pay | Admitting: Hematology

## 2020-01-20 ENCOUNTER — Ambulatory Visit (HOSPITAL_COMMUNITY)
Admission: RE | Admit: 2020-01-20 | Discharge: 2020-01-20 | Disposition: A | Discharge status: Home / Self Care | Payer: Medicare Other | Source: Ambulatory Visit | Attending: Hematology | Admitting: Hematology

## 2020-01-20 ENCOUNTER — Other Ambulatory Visit: Payer: Self-pay

## 2020-01-20 DIAGNOSIS — C3492 Malignant neoplasm of unspecified part of left bronchus or lung: Secondary | ICD-10-CM | POA: Insufficient documentation

## 2020-01-20 MED ORDER — HEPARIN SOD (PORK) LOCK FLUSH 100 UNIT/ML IV SOLN: 500.0000 [IU] | Freq: Once | INTRAVENOUS | Status: AC

## 2020-01-20 MED ORDER — SODIUM CHLORIDE FLUSH 0.9 % IV SOLN
INTRAVENOUS | Status: AC
  Administered 2020-01-20: 10 mL
  Filled 2020-01-20: qty 20

## 2020-01-20 MED ORDER — HEPARIN SOD (PORK) LOCK FLUSH 100 UNIT/ML IV SOLN
INTRAVENOUS | Status: AC
  Administered 2020-01-20: 500 [IU] via INTRAVENOUS
  Filled 2020-01-20: qty 5

## 2020-01-20 MED ORDER — IOHEXOL 300 MG/ML  SOLN
75.0000 mL | Freq: Once | INTRAMUSCULAR | Status: AC | PRN
  Administered 2020-01-20: 75 mL via INTRAVENOUS

## 2020-01-20 NOTE — Progress Notes (Signed)
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  PRIOR THERAPY: None  NGS Results: Not done  CURRENT THERAPY: Concurrent chemoradiation with carboplatin and paclitaxel weekly  BRIEF ONCOLOGIC HISTORY:  Oncology History  Squamous cell lung cancer, left ([place])  11/08/2019 Initial Diagnosis   Squamous cell lung cancer, left ([place])   11/08/2019 Cancer Staging   Staging form: Lung, AJCC 8th Edition - Clinical stage from 11/08/2019: Stage IIIB (cT4, cN2, cM0) - Signed by Derek Jack, MD on 11/08/2019   11/28/2019 -  Chemotherapy   The patient had palonosetron (ALOXI) injection 0.25 mg, 0.25 mg, Intravenous,  Once, 6 of 6 cycles Administration: 0.25 mg (11/28/2019), 0.25 mg (12/05/2019), 0.25 mg (12/12/2019), 0.25 mg (12/19/2019), 0.25 mg (12/26/2019), 0.25 mg (01/02/2020) CARBOplatin (PARAPLATIN) 180 mg in sodium chloride 0.9 % 250 mL chemo infusion, 180 mg (100 % of original dose 176 mg), Intravenous,  Once, 6 of 6 cycles Dose modification:   (original dose 176 mg, Cycle 1),   (original dose 201.2 mg, Cycle 2),   (original dose 179.2 mg, Cycle 3),   (original dose 179.2 mg, Cycle 4),   (original dose 195.4 mg, Cycle 5),   (original dose 178.2 mg, Cycle 6) Administration: 180 mg (11/28/2019), 200 mg (12/05/2019), 180 mg (12/12/2019), 180 mg (12/19/2019), 190 mg (12/26/2019), 180 mg (01/02/2020) PACLitaxel (TAXOL) 84 mg in sodium chloride 0.9 % 250 mL chemo infusion (</= 80mg /m2), 45 mg/m2 = 84 mg, Intravenous,  Once, 6 of 6 cycles Administration: 84 mg (11/28/2019), 84 mg (12/05/2019), 84 mg (12/12/2019), 84 mg (12/19/2019), 84 mg (12/26/2019), 84 mg (01/02/2020)  for chemotherapy treatment.      CANCER STAGING: Cancer Staging Squamous cell lung cancer, left [facility]) Staging form: Lung, AJCC 8th Edition - Clinical stage from  11/08/2019: Stage IIIB (cT4, cN2, cM0) - Signed by Derek Jack, MD on 11/08/2019   INTERVAL HISTORY:  [name], a 69 y.o. male, returns for routine follow-up and consideration for final cycle of chemotherapy. [name] was last seen on 01/02/2020.  He completed chemotherapy on 01/02/2020 He completed radiation on 01/06/2020 He is feeling better, feels easy to breathe, cough has improved, no chest pain or chest pressure. He is eating better. Some diarrhea, not too bothersome.  REVIEW OF SYSTEMS:  Review of Systems  Constitutional: Positive for fatigue (improving). Negative for appetite change (improving).  HENT:   Negative for trouble swallowing.   Respiratory: Positive for cough (mild) and shortness of breath (improving).   Cardiovascular: Negative for leg swelling.  Gastrointestinal: Positive for diarrhea. Negative for nausea.  Skin: Positive for rash (Rash almost resolved).  Psychiatric/Behavioral: Negative for depression. The patient is not nervous/anxious.   All other systems reviewed and are negative.   PAST MEDICAL/SURGICAL HISTORY:  Past Medical History:  Diagnosis Date  . Arthritis   . Hyperlipemia   . Hypertension   . Hyperthyroidism   . Myocardial infarction ([place])   . Paroxysmal atrial fibrillation ([facility])   . Port-A-Cath in place 11/14/2019  . Tachycardia    Past Surgical History:  Procedure Laterality Date  . CARDIAC CATHETERIZATION    . PORTACATH PLACEMENT Right 11/01/2019   Procedure: INSERTION PORT-A-CATH;  Surgeon: Aviva Signs, MD;  Location: [facility];  Service: General;  Laterality: Right;    SOCIAL HISTORY:  Social History   Socioeconomic History  .  Marital status: Married    Spouse name: Not on file  . Number of children: 1  . Years of education: Not on file  . Highest education level: Not on file  Occupational History  . Occupation: retired  . Occupation: Glass blower/designer: [employer]  Tobacco Use  . Smoking status: Former Smoker     Years: 45.00    Types: 63, Cigarettes    Quit date: 10/19/2014    Years since quitting: 5.2  . Smokeless tobacco: Never Used  Vaping Use  . Vaping Use: Never used  Substance and Sexual Activity  . Alcohol use: Never  . Drug use: No  . Sexual activity: Not on file  Other Topics Concern  . Not on file  Social History Narrative  . Not on file   Social Determinants of Health   Financial Resource Strain: Medium Risk  . Difficulty of Paying Living Expenses: Somewhat hard  Food Insecurity: No Food Insecurity  . Worried About Charity fundraiser in the Last Year: Never true  . Ran Out of Food in the Last Year: Never true  Transportation Needs: No Transportation Needs  . Lack of Transportation (Medical): No  . Lack of Transportation (Non-Medical): No  Physical Activity: Inactive  . Days of Exercise per Week: 0 days  . Minutes of Exercise per Session: 0 min  Stress: Stress Concern Present  . Feeling of Stress : Very much  Social Connections: Moderately Isolated  . Frequency of Communication with Friends and Family: More than three times a week  . Frequency of Social Gatherings with Friends and Family: More than three times a week  . Attends Religious Services: Never  . Active Member of Clubs or Organizations: No  . Attends Archivist Meetings: Never  . Marital Status: Married  Human resources officer Violence: Not At Risk  . Fear of Current or Ex-Partner: No  . Emotionally Abused: No  . Physically Abused: No  . Sexually Abused: No    FAMILY HISTORY:  Family History  Problem Relation Age of Onset  . Hypertension Father   . Dementia Father        died age 37  . Heart disease Father   . Heart failure Other     CURRENT MEDICATIONS:  Current Outpatient Medications  Medication Sig Dispense Refill  . acetaminophen (TYLENOL) 500 MG tablet Take 500 mg by mouth every 6 (six) hours as needed for moderate pain or headache.    . albuterol (PROVENTIL) (2.5 MG/3ML) 0.083%  nebulizer solution Take 3 mLs (2.5 mg total) by nebulization every 6 (six) hours as needed for wheezing or shortness of breath. 75 mL 12  . ALPRAZolam (XANAX) 0.25 MG tablet TAKE 1 TABLET BY MOUTH THREE TIMES DAILY AS NEEDED FOR ANXIETY 30 tablet 0  . alum & mag hydroxide-simeth (MAALOX MAX) 732-202-54 MG/5ML suspension Please take 1 tablespoon by mouth four times daily with meals. 480 mL 2  . apixaban (ELIQUIS) 5 MG TABS tablet Take 1 tablet (5 mg total) by mouth 2 (two) times daily. 60 tablet 6  . HYDROcodone-homatropine (HYCODAN) 5-1.5 MG/5ML syrup Take 15 mLs by mouth every 8 (eight) hours as needed for cough. 480 mL 0  . metoprolol tartrate (LOPRESSOR) 25 MG tablet Take one tablet(25 mg )  in the AM and take 1/2 Tablet (12.5mg ) in the PM 135 tablet 3  . CARBOPLATIN IV Inject into the vein once a week. (Patient not taking: Reported on 01/23/2020)    .  lidocaine-prilocaine (EMLA) cream Apply a small amount to port a cath site and cover with plastic wrap 1 hour prior to chemotherapy appointments (Patient not taking: Reported on 01/23/2020) 30 g 3  . PACLitaxel (TAXOL IV) Inject into the vein once a week. (Patient not taking: Reported on 01/23/2020)    . prochlorperazine (COMPAZINE) 10 MG tablet Take 1 tablet (10 mg total) by mouth every 6 (six) hours as needed (Nausea or vomiting). (Patient not taking: Reported on 01/23/2020) 30 tablet 1   No current facility-administered medications for this visit.    ALLERGIES:  No Known Allergies  PHYSICAL EXAM:  Performance status (ECOG): 1 - Symptomatic but completely ambulatory  Vitals:   01/23/20 0919  BP: 112/66  Pulse: 87  Resp: 18  Temp: (!) 97.3 F (36.3 C)  SpO2: 100%   Wt Readings from Last 3 Encounters:  01/23/20 163 lb 11.2 oz (74.3 kg)  01/02/20 164 lb (74.4 kg)  12/30/19 166 lb 9.6 oz (75.6 kg)   Physical Exam Vitals reviewed.  Constitutional:      Appearance: Normal appearance.  Cardiovascular:     Rate and Rhythm: Normal rate  and regular rhythm.     Pulses: Normal pulses.     Heart sounds: Normal heart sounds.  Pulmonary:     Effort: Pulmonary effort is normal.     Breath sounds: Normal breath sounds.  Musculoskeletal:     Right lower leg: No edema.     Left lower leg: No edema.  Skin:    Findings: Rash (nearly resolved) present. Rash is not papular.  Neurological:     General: No focal deficit present.     Mental Status: He is alert and oriented to person, place, and time.  Psychiatric:        Mood and Affect: Mood normal.        Behavior: Behavior normal.     LABORATORY DATA:  I have reviewed the labs as listed.  CBC Latest Ref Rng & Units 01/23/2020 01/09/2020 01/02/2020  WBC 4.0 - 10.5 K/uL 7.4 2.9(L) 2.7(L)  Hemoglobin 13.0 - 17.0 g/dL 9.7(L) 9.6(L) 10.2(L)  Hematocrit 39 - 52 % 30.9(L) 29.9(L) 31.2(L)  Platelets 150 - 400 K/uL 138(L) 190 160   CMP Latest Ref Rng & Units 01/23/2020 01/09/2020 01/02/2020  Glucose 70 - 99 mg/dL 101(H) 143(H) 123(H)  BUN 8 - 23 mg/dL 16 25(H) 24(H)  Creatinine 0.61 - 1.24 mg/dL 1.08 1.25(H) 1.18  Sodium 135 - 145 mmol/L 134(L) 135 132(L)  Potassium 3.5 - 5.1 mmol/L 4.1 4.3 3.9  Chloride 98 - 111 mmol/L 102 100 99  CO2 22 - 32 mmol/L 25 26 27   Calcium 8.9 - 10.3 mg/dL 8.6(L) 8.9 8.7(L)  Total Protein 6.5 - 8.1 g/dL 6.4(L) 6.5 6.6  Total Bilirubin 0.3 - 1.2 mg/dL 0.7 0.5 0.6  Alkaline Phos 38 - 126 U/L 72 65 65  AST 15 - 41 U/L 46(H) 38 23  ALT 0 - 44 U/L 49(H) 39 21    DIAGNOSTIC IMAGING:  I have independently reviewed the scans and discussed with the patient. CT Chest W Contrast  Result Date: 01/21/2020 CLINICAL DATA:  Metastatic non-small cell lung cancer, assess treatment response EXAM: CT CHEST WITH CONTRAST TECHNIQUE: Multidetector CT imaging of the chest was performed during intravenous contrast administration. CONTRAST:  44mL OMNIPAQUE IOHEXOL 300 MG/ML  SOLN COMPARISON:  Outside CT chest, 12/16/2019, PET-CT, 10/14/2019, CT chest, 10/06/2019 FINDINGS:  Cardiovascular: Right chest port catheter. Aortic atherosclerosis. Normal heart size. Extensive  3 vessel coronary artery calcifications and/or stents. Unchanged trace pericardial effusion. Mediastinum/Nodes: Unchanged post treatment appearance of soft tissue about the left hilum and AP window (series 2, image 55). No discretely enlarged mediastinal, hilar, or axillary lymph nodes. Unchanged heterogeneously hypodense nodule of the inferior left lobe of the thyroid. In the setting of significant comorbidities or limited life expectancy, no follow-up recommended (ref: J Am Coll Radiol. 2015 Feb;12(2): 143-50). Trachea, and esophagus demonstrate no significant findings. Lungs/Pleura: Trace left pleural effusion, decreased compared to prior examination. Significant interval decrease in bulk of consolidation of the left upper lobe, with improved aeration. The most dense region of consolidation of the superior left upper lobe measures approximately 5.5 x 3.8 cm in axial extent, previously 6.3 x 5.1 cm when measured similarly (series 2, image 39). Significant interval decrease in extensive irregular nodularity and consolidation of the left lower lobe (series 4, image 95). Upper Abdomen: No acute abnormality. Musculoskeletal: No chest wall mass or suspicious bone lesions identified. IMPRESSION: 1. Significant interval decrease in bulk of consolidation of the left upper lobe, with improved aeration. 2. Significant interval decrease in extensive irregular nodularity and consolidation of the left lower lobe, likely reflecting lymphangitic disease. 3. Unchanged post treatment appearance of soft tissue about the left hilum and AP window. No discretely enlarged mediastinal or hilar lymph nodes. 4. Trace left pleural effusion, decreased compared to prior examination, consistent with improved malignant effusion. 5. Overall constellation of findings is consistent with treatment response. 6. Coronary artery disease. Aortic  Atherosclerosis (ICD10-I70.0). Electronically Signed   By: Eddie Candle M.D.   On: 01/21/2020 17:14     ASSESSMENT:  1. Left lung cancer: -Presentation to the ER on 10/06/2019 with cough and dizziness. -CT chest with contrast on 10/06/2019 showed large left suprahilar mass completely obstructing left upper lobe bronchus, measuring 7 x 5.3 x 5.8 cm. Mass extends into AP window and narrows the left mainstem bronchus. Small subpleural nodule in the left lower lobe measuring 4 mm indeterminate. AP window nodal mass measuring 3.4 cm. No supraclavicular adenopathy. Subcarinal enlarged lymph node measures 1.4 cm. -PET scan on 10/14/2019 shows large hypermetabolic left upper lobe mass surrounding the left hilum, extending into pleural surface not invading the chest wall. Direct invasion into the AP window and metastatic subcarinal lymph node, T4 N2 M0. 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 -10 pound weight loss in the last 6 months. Hoarseness for last 1 week. -MRI of the brain on 10/28/2019 with no evidence of metastasis. -Concurrent chemoradiation therapy started on 11/28/2019. Completed 01/02/20 ( chemo) 01/06/2020(radiation)  2. Social/family history: -He smoked 1 pack/day for 45 years, quit 5 years ago. -No family history of malignancies.   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 -If he gets at least a partial response, plan is to initiate durvalumab consolidation 4 weeks after completion of radiation therapy. - He had CT imaging 01/20/2020, per my review as well as radiology review, definite response compared to Oct 2021 scan. We should consider adjuvant durvalumab for a total of one yr Discussed mechanism of action, adverse effects from immunotherapy including but not limited to fatigue, skin rash, diarrhea,  hypo/hypothyroidism, hypo/hyperglycemia, hepatitis, nephritis, hypophysitis, myocarditis and some of these adverse effects could be fatal and life threatening. He is agreeable to proceed. [place] plan  placed.  2. Cough, improved significantly  3. Atrial fibrillation: -Continue Eliquis 5 mg twice daily.  4. Nutrition: -He is drinking Ensure up to 4 to 5 cans/day, weight is stable since last visit.  6. Hypertension: -He is taking metoprolol 25 mg in the morning and 12.5 mg in the evening.  Blood pressure today is 98/66.   Orders placed this encounter:  No orders of the defined types were placed in this encounter.  Benay Pike MD

## 2020-01-23 ENCOUNTER — Inpatient Hospital Stay (HOSPITAL_COMMUNITY): Payer: Medicare Other | Attending: Hematology

## 2020-01-23 ENCOUNTER — Other Ambulatory Visit: Payer: Self-pay

## 2020-01-23 ENCOUNTER — Inpatient Hospital Stay (HOSPITAL_BASED_OUTPATIENT_CLINIC_OR_DEPARTMENT_OTHER): Payer: Medicare Other | Admitting: Hematology and Oncology

## 2020-01-23 ENCOUNTER — Inpatient Hospital Stay (HOSPITAL_COMMUNITY): Payer: Medicare Other

## 2020-01-23 ENCOUNTER — Encounter (HOSPITAL_COMMUNITY): Payer: Self-pay | Admitting: Hematology and Oncology

## 2020-01-23 VITALS — BP 112/66 | HR 87 | Temp 97.3°F | Resp 18 | Wt 163.7 lb

## 2020-01-23 DIAGNOSIS — R059 Cough, unspecified: Secondary | ICD-10-CM | POA: Diagnosis not present

## 2020-01-23 DIAGNOSIS — Z7901 Long term (current) use of anticoagulants: Secondary | ICD-10-CM | POA: Diagnosis not present

## 2020-01-23 DIAGNOSIS — C3492 Malignant neoplasm of unspecified part of left bronchus or lung: Secondary | ICD-10-CM

## 2020-01-23 DIAGNOSIS — Z452 Encounter for adjustment and management of vascular access device: Secondary | ICD-10-CM | POA: Insufficient documentation

## 2020-01-23 DIAGNOSIS — C3412 Malignant neoplasm of upper lobe, left bronchus or lung: Secondary | ICD-10-CM | POA: Diagnosis present

## 2020-01-23 DIAGNOSIS — Z5112 Encounter for antineoplastic immunotherapy: Secondary | ICD-10-CM | POA: Insufficient documentation

## 2020-01-23 DIAGNOSIS — Z79899 Other long term (current) drug therapy: Secondary | ICD-10-CM | POA: Insufficient documentation

## 2020-01-23 DIAGNOSIS — Z95828 Presence of other vascular implants and grafts: Secondary | ICD-10-CM

## 2020-01-23 DIAGNOSIS — I4891 Unspecified atrial fibrillation: Secondary | ICD-10-CM | POA: Diagnosis not present

## 2020-01-23 DIAGNOSIS — I10 Essential (primary) hypertension: Secondary | ICD-10-CM | POA: Insufficient documentation

## 2020-01-23 LAB — COMPREHENSIVE METABOLIC PANEL
ALT: 49 U/L — ABNORMAL HIGH (ref 0–44)
AST: 46 U/L — ABNORMAL HIGH (ref 15–41)
Albumin: 3 g/dL — ABNORMAL LOW (ref 3.5–5.0)
Alkaline Phosphatase: 72 U/L (ref 38–126)
Anion gap: 7 (ref 5–15)
BUN: 16 mg/dL (ref 8–23)
CO2: 25 mmol/L (ref 22–32)
Calcium: 8.6 mg/dL — ABNORMAL LOW (ref 8.9–10.3)
Chloride: 102 mmol/L (ref 98–111)
Creatinine, Ser: 1.08 mg/dL (ref 0.61–1.24)
GFR, Estimated: >60 mL/min (ref >60)
Glucose, Bld: 101 mg/dL — ABNORMAL HIGH (ref 70–99)
Potassium: 4.1 mmol/L (ref 3.5–5.1)
Sodium: 134 mmol/L — ABNORMAL LOW (ref 135–145)
Total Bilirubin: 0.7 mg/dL (ref 0.3–1.2)
Total Protein: 6.4 g/dL — ABNORMAL LOW (ref 6.5–8.1)

## 2020-01-23 LAB — CBC WITH DIFFERENTIAL/PLATELET
Abs Immature Granulocytes: 0.04 10*3/uL (ref 0.00–0.07)
Basophils Absolute: 0.1 10*3/uL (ref 0.0–0.1)
Basophils Relative: 1 %
Eosinophils Absolute: 0.1 10*3/uL (ref 0.0–0.5)
Eosinophils Relative: 1 %
HCT: 30.9 % — ABNORMAL LOW (ref 39.0–52.0)
Hemoglobin: 9.7 g/dL — ABNORMAL LOW (ref 13.0–17.0)
Immature Granulocytes: 1 %
Lymphocytes Relative: 9 %
Lymphs Abs: 0.7 10*3/uL (ref 0.7–4.0)
MCH: 29.7 pg (ref 26.0–34.0)
MCHC: 31.4 g/dL (ref 30.0–36.0)
MCV: 94.5 fL (ref 80.0–100.0)
Monocytes Absolute: 0.8 10*3/uL (ref 0.1–1.0)
Monocytes Relative: 10 %
Neutro Abs: 5.9 10*3/uL (ref 1.7–7.7)
Neutrophils Relative %: 78 %
Platelets: 138 10*3/uL — ABNORMAL LOW (ref 150–400)
RBC: 3.27 MIL/uL — ABNORMAL LOW (ref 4.22–5.81)
RDW: 21.3 % — ABNORMAL HIGH (ref 11.5–15.5)
WBC: 7.4 10*3/uL (ref 4.0–10.5)
nRBC: 0 % (ref 0.0–0.2)

## 2020-01-23 MED ORDER — HEPARIN SOD (PORK) LOCK FLUSH 100 UNIT/ML IV SOLN
500.0000 [IU] | Freq: Once | INTRAVENOUS | Status: AC
  Administered 2020-01-23: 500 [IU] via INTRAVENOUS

## 2020-01-23 NOTE — Progress Notes (Signed)
DISCONTINUE ON PATHWAY REGIMEN - Non-Small Cell Lung     Administer weekly:     Paclitaxel      Carboplatin   **Always confirm dose/schedule in your pharmacy ordering system**  REASON: Continuation Of Treatment PRIOR TREATMENT: [id number]: Carboplatin AUC=2 + Paclitaxel 45 mg/m2 Weekly During Radiation TREATMENT RESPONSE: Partial Response (PR)  START ON PATHWAY REGIMEN - Non-Small Cell Lung     A cycle is every 14 days:     Durvalumab   **Always confirm dose/schedule in your pharmacy ordering system**  Patient Characteristics: Preoperative or Nonsurgical Candidate (Clinical Staging), Stage III - Nonsurgical Candidate (Nonsquamous and Squamous), PS = 0, 1 Therapeutic Status: Preoperative or Nonsurgical Candidate (Clinical Staging) AJCC T Category: cT4 AJCC N Category: cN2 AJCC M Category: cM0 AJCC 8 Stage Grouping: IIIB ECOG Performance Status: 1 Intent of Therapy: Curative Intent, Discussed with Patient

## 2020-01-23 NOTE — Addendum Note (Signed)
Addended by: Henreitta Leber E on: 01/23/2020 11:23 AM   Modules accepted: Orders

## 2020-01-23 NOTE — Progress Notes (Signed)
Pt will not be receiving treatment today due to unavailability of Imfinzi in pharmacy. Per Dr. Chryl Heck, since there is no immediate need and prior authorization is required, we will have patient come back and start treatment next week. Patient and wife aware and agreeable. Port flushed and deaccessed per protocol, see MAR and IV flowsheet for details. Discharged in satisfactory condition with follow up instructions.

## 2020-01-23 NOTE — Progress Notes (Signed)
No treatment today. Primary RN and pharmacy aware.  Patient will return next week for Imfinizi.

## 2020-01-25 NOTE — Progress Notes (Signed)
.   Pharmacist Chemotherapy Monitoring - Initial Assessment    Anticipated start date: 01/30/20   Regimen:  . Are orders appropriate based on the patient's diagnosis, regimen, and cycle? Yes . Does the plan date match the patient's scheduled date? Yes . Is the sequencing of drugs appropriate? Yes . Are the premedications appropriate for the patient's regimen? Yes . Prior Authorization for treatment is: Pending o If applicable, is the correct biosimilar selected based on the patient's insurance? not applicable  Organ Function and Labs: Marland Kitchen Are dose adjustments needed based on the patient's renal function, hepatic function, or hematologic function? No . Are appropriate labs ordered prior to the start of patient's treatment? Yes . Other organ system assessment, if indicated: N/A . The following baseline labs, if indicated, have been ordered: durvalumab: baseline TSH +/- T4  Dose Assessment: . Are the drug doses appropriate? Yes . Are the following correct: o Drug concentrations Yes o IV fluid compatible with drug Yes o Administration routes Yes o Timing of therapy Yes . If applicable, does the patient have documented access for treatment and/or plans for port-a-cath placement? yes . If applicable, have lifetime cumulative doses been properly documented and assessed? not applicable Lifetime Dose Tracking  . Carboplatin: 1,110 mg = 0.01 % of the maximum lifetime dose of 999,999,999 mg  o   Toxicity Monitoring/Prevention: . The patient has the following take home antiemetics prescribed: N/A . The patient has the following take home medications prescribed: N/A . Medication allergies and previous infusion related reactions, if applicable, have been reviewed and addressed. Yes . The patient's current medication list has been assessed for drug-drug interactions with their chemotherapy regimen. no significant drug-drug interactions were identified on review.  Order Review: . Are the treatment  plan orders signed? Yes . Is the patient scheduled to see a provider prior to their treatment? No  I verify that I have reviewed each item in the above checklist and answered each question accordingly.  Wynona Neat 01/25/2020 3:40 PM

## 2020-01-28 ENCOUNTER — Other Ambulatory Visit: Payer: Self-pay

## 2020-01-28 ENCOUNTER — Ambulatory Visit
Admission: EM | Admit: 2020-01-28 | Discharge: 2020-01-28 | Disposition: A | Discharge status: Home / Self Care | Payer: Medicare Other | Attending: Internal Medicine | Admitting: Internal Medicine

## 2020-01-28 DIAGNOSIS — Z1152 Encounter for screening for COVID-19: Secondary | ICD-10-CM

## 2020-01-28 NOTE — ED Triage Notes (Signed)
covid exposure

## 2020-01-30 ENCOUNTER — Other Ambulatory Visit: Payer: Self-pay

## 2020-01-30 ENCOUNTER — Inpatient Hospital Stay (HOSPITAL_COMMUNITY): Payer: Medicare Other

## 2020-01-30 VITALS — BP 117/78 | HR 100 | Temp 97.4°F | Resp 18

## 2020-01-30 DIAGNOSIS — C3492 Malignant neoplasm of unspecified part of left bronchus or lung: Secondary | ICD-10-CM

## 2020-01-30 DIAGNOSIS — Z95828 Presence of other vascular implants and grafts: Secondary | ICD-10-CM

## 2020-01-30 DIAGNOSIS — Z5112 Encounter for antineoplastic immunotherapy: Secondary | ICD-10-CM | POA: Diagnosis not present

## 2020-01-30 LAB — COMPREHENSIVE METABOLIC PANEL
ALT: 31 U/L (ref 0–44)
AST: 31 U/L (ref 15–41)
Albumin: 2.9 g/dL — ABNORMAL LOW (ref 3.5–5.0)
Alkaline Phosphatase: 71 U/L (ref 38–126)
Anion gap: 6 (ref 5–15)
BUN: 13 mg/dL (ref 8–23)
CO2: 25 mmol/L (ref 22–32)
Calcium: 8.4 mg/dL — ABNORMAL LOW (ref 8.9–10.3)
Chloride: 102 mmol/L (ref 98–111)
Creatinine, Ser: 0.93 mg/dL (ref 0.61–1.24)
GFR, Estimated: >60 mL/min (ref >60)
Glucose, Bld: 102 mg/dL — ABNORMAL HIGH (ref 70–99)
Potassium: 3.9 mmol/L (ref 3.5–5.1)
Sodium: 133 mmol/L — ABNORMAL LOW (ref 135–145)
Total Bilirubin: 0.5 mg/dL (ref 0.3–1.2)
Total Protein: 6.6 g/dL (ref 6.5–8.1)

## 2020-01-30 LAB — CBC WITH DIFFERENTIAL/PLATELET
Abs Immature Granulocytes: 0.03 10*3/uL (ref 0.00–0.07)
Basophils Absolute: 0 10*3/uL (ref 0.0–0.1)
Basophils Relative: 1 %
Eosinophils Absolute: 0.2 10*3/uL (ref 0.0–0.5)
Eosinophils Relative: 2 %
HCT: 30.7 % — ABNORMAL LOW (ref 39.0–52.0)
Hemoglobin: 9.7 g/dL — ABNORMAL LOW (ref 13.0–17.0)
Immature Granulocytes: 0 %
Lymphocytes Relative: 7 %
Lymphs Abs: 0.5 10*3/uL — ABNORMAL LOW (ref 0.7–4.0)
MCH: 30 pg (ref 26.0–34.0)
MCHC: 31.6 g/dL (ref 30.0–36.0)
MCV: 95 fL (ref 80.0–100.0)
Monocytes Absolute: 0.5 10*3/uL (ref 0.1–1.0)
Monocytes Relative: 6 %
Neutro Abs: 6.2 10*3/uL (ref 1.7–7.7)
Neutrophils Relative %: 84 %
Platelets: 147 10*3/uL — ABNORMAL LOW (ref 150–400)
RBC: 3.23 MIL/uL — ABNORMAL LOW (ref 4.22–5.81)
RDW: 21 % — ABNORMAL HIGH (ref 11.5–15.5)
WBC: 7.4 10*3/uL (ref 4.0–10.5)
nRBC: 0 % (ref 0.0–0.2)

## 2020-01-30 LAB — TSH: TSH: 1.047 u[IU]/mL (ref 0.350–4.500)

## 2020-01-30 LAB — NOVEL CORONAVIRUS, NAA: SARS-CoV-2, NAA: NOT DETECTED

## 2020-01-30 LAB — SARS-COV-2, NAA 2 DAY TAT

## 2020-01-30 MED ORDER — SODIUM CHLORIDE 0.9 % IV SOLN: Freq: Once | INTRAVENOUS | Status: AC

## 2020-01-30 MED ORDER — HEPARIN SOD (PORK) LOCK FLUSH 100 UNIT/ML IV SOLN
500.0000 [IU] | Freq: Once | INTRAVENOUS | Status: AC | PRN
  Administered 2020-01-30: 16:00:00 500 [IU]

## 2020-01-30 MED ORDER — SODIUM CHLORIDE 0.9 % IV SOLN
10.0000 mg/kg | Freq: Once | INTRAVENOUS | Status: AC
  Administered 2020-01-30: 14:00:00 740 mg via INTRAVENOUS
  Filled 2020-01-30: qty 10

## 2020-01-30 MED ORDER — SODIUM CHLORIDE 0.9% FLUSH
10.0000 mL | INTRAVENOUS | Status: DC | PRN
  Administered 2020-01-30: 16:00:00 10 mL

## 2020-01-30 NOTE — Patient Instructions (Signed)
[facility] Discharge Instructions for Patients Receiving Chemotherapy  Today you received the following chemotherapy agents   To help prevent nausea and vomiting after your treatment, we encourage you to take your nausea medication   If you develop nausea and vomiting that is not controlled by your nausea medication, call the clinic.   BELOW ARE SYMPTOMS THAT SHOULD BE REPORTED IMMEDIATELY:  *FEVER GREATER THAN 100.5 F  *CHILLS WITH OR WITHOUT FEVER  NAUSEA AND VOMITING THAT IS NOT CONTROLLED WITH YOUR NAUSEA MEDICATION  *UNUSUAL SHORTNESS OF BREATH  *UNUSUAL BRUISING OR BLEEDING  TENDERNESS IN MOUTH AND THROAT WITH OR WITHOUT PRESENCE OF ULCERS  *URINARY PROBLEMS  *BOWEL PROBLEMS  UNUSUAL RASH Items with * indicate a potential emergency and should be followed up as soon as possible.  Feel free to call the clinic should you have any questions or concerns. The clinic phone number is [phone].  Please show the CHEMO ALERT CARD at check-in to the Emergency Department and triage nurse.   

## 2020-01-30 NOTE — Progress Notes (Signed)
Patient presents today for Imfinzi infusion.  Vital signs stable.  Labs pending.  Patients only complaint is that he has trouble sleeping.  Treatment given today per MD orders.  Tolerated infusion without adverse affects.  Vital signs stable.  No complaints at this time.  Discharge from clinic ambulatory in stable condition.  Alert and oriented X 3.  Follow up with [facility] as scheduled.

## 2020-01-30 NOTE — Progress Notes (Signed)
Labs within parameters for today's treatment. MAR reviewed. Patient denies any changes since his last visit. No complaints noted today.

## 2020-02-13 ENCOUNTER — Inpatient Hospital Stay (HOSPITAL_COMMUNITY): Payer: Medicare Other

## 2020-02-13 ENCOUNTER — Inpatient Hospital Stay (HOSPITAL_BASED_OUTPATIENT_CLINIC_OR_DEPARTMENT_OTHER): Payer: Medicare Other | Admitting: Hematology

## 2020-02-13 ENCOUNTER — Other Ambulatory Visit: Payer: Self-pay

## 2020-02-13 VITALS — BP 101/63 | HR 96 | Temp 97.2°F | Resp 18

## 2020-02-13 VITALS — BP 110/95

## 2020-02-13 DIAGNOSIS — C3492 Malignant neoplasm of unspecified part of left bronchus or lung: Secondary | ICD-10-CM

## 2020-02-13 DIAGNOSIS — Z95828 Presence of other vascular implants and grafts: Secondary | ICD-10-CM

## 2020-02-13 DIAGNOSIS — Z5112 Encounter for antineoplastic immunotherapy: Secondary | ICD-10-CM | POA: Diagnosis not present

## 2020-02-13 LAB — CBC WITH DIFFERENTIAL/PLATELET
Abs Immature Granulocytes: 0.03 10*3/uL (ref 0.00–0.07)
Basophils Absolute: 0 10*3/uL (ref 0.0–0.1)
Basophils Relative: 1 %
Eosinophils Absolute: 0.1 10*3/uL (ref 0.0–0.5)
Eosinophils Relative: 3 %
HCT: 29.9 % — ABNORMAL LOW (ref 39.0–52.0)
Hemoglobin: 9.2 g/dL — ABNORMAL LOW (ref 13.0–17.0)
Immature Granulocytes: 1 %
Lymphocytes Relative: 13 %
Lymphs Abs: 0.6 10*3/uL — ABNORMAL LOW (ref 0.7–4.0)
MCH: 30.5 pg (ref 26.0–34.0)
MCHC: 30.8 g/dL (ref 30.0–36.0)
MCV: 99 fL (ref 80.0–100.0)
Monocytes Absolute: 0.6 10*3/uL (ref 0.1–1.0)
Monocytes Relative: 13 %
Neutro Abs: 3.4 10*3/uL (ref 1.7–7.7)
Neutrophils Relative %: 69 %
Platelets: 151 10*3/uL (ref 150–400)
RBC: 3.02 MIL/uL — ABNORMAL LOW (ref 4.22–5.81)
RDW: 20.6 % — ABNORMAL HIGH (ref 11.5–15.5)
WBC: 4.9 10*3/uL (ref 4.0–10.5)
nRBC: 0 % (ref 0.0–0.2)

## 2020-02-13 LAB — COMPREHENSIVE METABOLIC PANEL
ALT: 27 U/L (ref 0–44)
AST: 33 U/L (ref 15–41)
Albumin: 2.9 g/dL — ABNORMAL LOW (ref 3.5–5.0)
Alkaline Phosphatase: 62 U/L (ref 38–126)
Anion gap: 9 (ref 5–15)
BUN: 14 mg/dL (ref 8–23)
CO2: 24 mmol/L (ref 22–32)
Calcium: 8.4 mg/dL — ABNORMAL LOW (ref 8.9–10.3)
Chloride: 102 mmol/L (ref 98–111)
Creatinine, Ser: 1 mg/dL (ref 0.61–1.24)
GFR, Estimated: >60 mL/min (ref >60)
Glucose, Bld: 95 mg/dL (ref 70–99)
Potassium: 3.9 mmol/L (ref 3.5–5.1)
Sodium: 135 mmol/L (ref 135–145)
Total Bilirubin: 0.6 mg/dL (ref 0.3–1.2)
Total Protein: 6.4 g/dL — ABNORMAL LOW (ref 6.5–8.1)

## 2020-02-13 LAB — TSH: TSH: 0.944 u[IU]/mL (ref 0.350–4.500)

## 2020-02-13 MED ORDER — HEPARIN SOD (PORK) LOCK FLUSH 100 UNIT/ML IV SOLN
500.0000 [IU] | Freq: Once | INTRAVENOUS | Status: AC | PRN
  Administered 2020-02-13: 11:00:00 500 [IU]

## 2020-02-13 MED ORDER — SODIUM CHLORIDE 0.9% FLUSH
10.0000 mL | INTRAVENOUS | Status: DC | PRN
  Administered 2020-02-13: 11:00:00 10 mL

## 2020-02-13 MED ORDER — HYDROCODONE-HOMATROPINE 5-1.5 MG/5ML PO SYRP: 15.0000 mL | ORAL_SOLUTION | Freq: Three times a day (TID) | ORAL | 0 refills | Status: DC | PRN

## 2020-02-13 MED ORDER — SODIUM CHLORIDE 0.9 % IV SOLN
Freq: Once | INTRAVENOUS | Status: AC
Start: 2020-02-13 — End: 2020-02-13

## 2020-02-13 MED ORDER — SODIUM CHLORIDE 0.9 % IV SOLN
10.0000 mg/kg | Freq: Once | INTRAVENOUS | Status: AC
  Administered 2020-02-13: 10:00:00 740 mg via INTRAVENOUS
  Filled 2020-02-13: qty 10

## 2020-02-13 NOTE — Progress Notes (Signed)
Pt here for D1C2 of imfinizi. Labs WNL for treatment today.   Tolerated treatment well today without incidence.  Discharged in stable condition ambulatory.  Vital signs stable prior to discharge.

## 2020-02-13 NOTE — Progress Notes (Signed)
Good for treatment today.

## 2020-02-13 NOTE — Progress Notes (Signed)
Patients port flushed without difficulty.  Good blood return noted with no bruising or swelling noted at site.  Transparent dressing applied.  Patient left accessed for chemotherapy treatment. 

## 2020-02-13 NOTE — Patient Instructions (Signed)
[place] at [facility] Discharge Instructions  You were seen today by Dr. Delton Coombes. He went over your recent results. You received your treatment today; continue getting your treatment every 2 weeks. If you develop watery diarrhea, take 2 tablets of Imodium and then take 1 tablet after every bowel movement. Dr. Delton Coombes will see you back in 4 weeks for labs and follow up.   Thank you for choosing [place] at [facility] to provide your oncology and hematology care.  To afford each patient quality time with our provider, please arrive at least 15 minutes before your scheduled appointment time.   If you have a lab appointment with the [facility] please come in thru the Main Entrance and check in at the main information desk  You need to re-schedule your appointment should you arrive 10 or more minutes late.  We strive to give you quality time with our providers, and arriving late affects you and other patients whose appointments are after yours.  Also, if you no show three or more times for appointments you may be dismissed from the clinic at the providers discretion.     Again, thank you for choosing [facility].  Our hope is that these requests will decrease the amount of time that you wait before being seen by our physicians.       _____________________________________________________________  Should you have questions after your visit to [facility], please contact our office at [phone] between the hours of 8:00 a.m. and 4:30 p.m.  Voicemails left after 4:00 p.m. will not be returned until the following business day.  For prescription refill requests, have your pharmacy contact our office and allow 72 hours.    Cancer Center Support Programs:   > Cancer Support Group  2nd Tuesday of the month 1pm-2pm, Journey Room

## 2020-02-13 NOTE — Patient Instructions (Signed)
[facility] Discharge Instructions for Patients Receiving Chemotherapy  Today you received the following chemotherapy agents   To help prevent nausea and vomiting after your treatment, we encourage you to take your nausea medication   If you develop nausea and vomiting that is not controlled by your nausea medication, call the clinic.   BELOW ARE SYMPTOMS THAT SHOULD BE REPORTED IMMEDIATELY:  *FEVER GREATER THAN 100.5 F  *CHILLS WITH OR WITHOUT FEVER  NAUSEA AND VOMITING THAT IS NOT CONTROLLED WITH YOUR NAUSEA MEDICATION  *UNUSUAL SHORTNESS OF BREATH  *UNUSUAL BRUISING OR BLEEDING  TENDERNESS IN MOUTH AND THROAT WITH OR WITHOUT PRESENCE OF ULCERS  *URINARY PROBLEMS  *BOWEL PROBLEMS  UNUSUAL RASH Items with * indicate a potential emergency and should be followed up as soon as possible.  Feel free to call the clinic should you have any questions or concerns. The clinic phone number is [phone].  Please show the CHEMO ALERT CARD at check-in to the Emergency Department and triage nurse.   

## 2020-02-13 NOTE — Progress Notes (Signed)
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  PRIOR THERAPY: Concurrent chemoradiation with weekly carboplatin and paclitaxel x 6 cycles from 11/28/2019 to 01/02/2020  NGS Results: Not done  CURRENT THERAPY: Durvalumab every 2 weeks  BRIEF ONCOLOGIC HISTORY:  Oncology History  Squamous cell lung cancer, left ([facility])  11/08/2019 Initial Diagnosis   Squamous cell lung cancer, left ([facility])   11/08/2019 Cancer Staging   Staging form: Lung, AJCC 8th Edition - Clinical stage from 11/08/2019: Stage IIIB (cT4, cN2, cM0) - Signed by Derek Jack, MD on 11/08/2019   11/28/2019 - 01/02/2020 Chemotherapy   The patient had palonosetron (ALOXI) injection 0.25 mg, 0.25 mg, Intravenous,  Once, 6 of 6 cycles Administration: 0.25 mg (11/28/2019), 0.25 mg (12/05/2019), 0.25 mg (12/12/2019), 0.25 mg (12/19/2019), 0.25 mg (12/26/2019), 0.25 mg (01/02/2020) CARBOplatin (PARAPLATIN) 180 mg in sodium chloride 0.9 % 250 mL chemo infusion, 180 mg (100 % of original dose 176 mg), Intravenous,  Once, 6 of 6 cycles Dose modification:   (original dose 176 mg, Cycle 1),   (original dose 201.2 mg, Cycle 2),   (original dose 179.2 mg, Cycle 3),   (original dose 179.2 mg, Cycle 4),   (original dose 195.4 mg, Cycle 5),   (original dose 178.2 mg, Cycle 6) Administration: 180 mg (11/28/2019), 200 mg (12/05/2019), 180 mg (12/12/2019), 180 mg (12/19/2019), 190 mg (12/26/2019), 180 mg (01/02/2020) PACLitaxel (TAXOL) 84 mg in sodium chloride 0.9 % 250 mL chemo infusion (</= 80mg /m2), 45 mg/m2 = 84 mg, Intravenous,  Once, 6 of 6 cycles Administration: 84 mg (11/28/2019), 84 mg (12/05/2019), 84 mg (12/12/2019), 84 mg (12/19/2019), 84 mg (12/26/2019), 84 mg (01/02/2020)  for chemotherapy treatment.    01/30/2020 -  Chemotherapy   The patient had durvalumab  (IMFINZI) 740 mg in sodium chloride 0.9 % 100 mL chemo infusion, 10 mg/kg = 740 mg, Intravenous,  Once, 1 of 6 cycles Administration: 740 mg (01/30/2020)  for chemotherapy treatment.      CANCER STAGING: Cancer Staging Squamous cell lung cancer, left [facility]) Staging form: Lung, AJCC 8th Edition - Clinical stage from 11/08/2019: Stage IIIB (cT4, cN2, cM0) - Signed by Derek Jack, MD on 11/08/2019   INTERVAL HISTORY:  Alan [name], a 69 y.o. male, returns for routine follow-up and consideration for next cycle of immunotherapy. June was last seen on 01/23/2020.  Due for cycle #2 of durvalumab today.   Today he is accompanied by his wife. Overall, he tells me he has been feeling pretty well. He is tolerating the durvalumab well and denies having abdominal pain, rashes, itching or diarrhea. His swallowing has improved and his appetite is excellent; he eats 1 really good meal and snacks throughout the day and drinks 1-2 cans of Ensure daily to QOD. His coughing spells have improved and he continues taking Hycodan. He denies having leg swelling.  Overall, he feels ready for next cycle of immunotherapy today.    REVIEW OF SYSTEMS:  Review of Systems  Constitutional: Positive for fatigue (75%). Negative for appetite change.  HENT:   Negative for trouble swallowing.   Cardiovascular: Negative for leg swelling.  Gastrointestinal: Negative for abdominal pain and diarrhea.  Skin: Negative for itching and rash.  All other systems reviewed and are negative.   PAST MEDICAL/SURGICAL HISTORY:  Past Medical History:  Diagnosis Date  .  Arthritis   . Hyperlipemia   . Hypertension   . Hyperthyroidism   . Myocardial infarction ([facility])   . Paroxysmal atrial fibrillation ([facility])   . Port-A-Cath in place 11/14/2019  . Tachycardia    Past Surgical History:  Procedure Laterality Date  . CARDIAC CATHETERIZATION    . PORTACATH PLACEMENT Right 11/01/2019   Procedure: INSERTION PORT-A-CATH;   Surgeon: Aviva Signs, MD;  Location: [facility];  Service: General;  Laterality: Right;    SOCIAL HISTORY:  Social History   Socioeconomic History  . Marital status: Married    Spouse name: Not on file  . Number of children: 1  . Years of education: Not on file  . Highest education level: Not on file  Occupational History  . Occupation: retired  . Occupation: Glass blower/designer: [employer]  Tobacco Use  . Smoking status: Former Smoker    Years: 45.00    Types: 35, Cigarettes    Quit date: 10/19/2014    Years since quitting: 5.3  . Smokeless tobacco: Never Used  Vaping Use  . Vaping Use: Never used  Substance and Sexual Activity  . Alcohol use: Never  . Drug use: No  . Sexual activity: Not on file  Other Topics Concern  . Not on file  Social History Narrative  . Not on file   Social Determinants of Health   Financial Resource Strain: Medium Risk  . Difficulty of Paying Living Expenses: Somewhat hard  Food Insecurity: No Food Insecurity  . Worried About Charity fundraiser in the Last Year: Never true  . Ran Out of Food in the Last Year: Never true  Transportation Needs: No Transportation Needs  . Lack of Transportation (Medical): No  . Lack of Transportation (Non-Medical): No  Physical Activity: Inactive  . Days of Exercise per Week: 0 days  . Minutes of Exercise per Session: 0 min  Stress: Stress Concern Present  . Feeling of Stress : Very much  Social Connections: Moderately Isolated  . Frequency of Communication with Friends and Family: More than three times a week  . Frequency of Social Gatherings with Friends and Family: More than three times a week  . Attends Religious Services: Never  . Active Member of Clubs or Organizations: No  . Attends Archivist Meetings: Never  . Marital Status: Married  Human resources officer Violence: Not At Risk  . Fear of Current or Ex-Partner: No  . Emotionally Abused: No  . Physically Abused: No  . Sexually Abused: No     FAMILY HISTORY:  Family History  Problem Relation Age of Onset  . Hypertension Father   . Dementia Father        died age 81  . Heart disease Father   . Heart failure Other     CURRENT MEDICATIONS:  Current Outpatient Medications  Medication Sig Dispense Refill  . acetaminophen (TYLENOL) 500 MG tablet Take 500 mg by mouth every 6 (six) hours as needed for moderate pain or headache.    . albuterol (PROVENTIL) (2.5 MG/3ML) 0.083% nebulizer solution Take 3 mLs (2.5 mg total) by nebulization every 6 (six) hours as needed for wheezing or shortness of breath. 75 mL 12  . ALPRAZolam (XANAX) 0.25 MG tablet TAKE 1 TABLET BY MOUTH THREE TIMES DAILY AS NEEDED FOR ANXIETY 30 tablet 0  . alum & mag hydroxide-simeth (MAALOX MAX) 194-174-08 MG/5ML suspension Please take 1 tablespoon by mouth four times daily with meals. 480 mL 2  .  apixaban (ELIQUIS) 5 MG TABS tablet Take 1 tablet (5 mg total) by mouth 2 (two) times daily. 60 tablet 6  . HYDROcodone-homatropine (HYCODAN) 5-1.5 MG/5ML syrup Take 15 mLs by mouth every 8 (eight) hours as needed for cough. 480 mL 0  . metoprolol tartrate (LOPRESSOR) 25 MG tablet Take one tablet(25 mg )  in the AM and take 1/2 Tablet (12.5mg ) in the PM 135 tablet 3   No current facility-administered medications for this visit.    ALLERGIES:  No Known Allergies  PHYSICAL EXAM:  Performance status (ECOG): 1 - Symptomatic but completely ambulatory  Vitals:   02/13/20 0844  BP: (!) 110/95   Wt Readings from Last 3 Encounters:  02/13/20 165 lb (74.8 kg)  01/30/20 164 lb (74.4 kg)  01/23/20 163 lb 11.2 oz (74.3 kg)   Physical Exam Vitals reviewed.  Constitutional:      Appearance: Normal appearance.  Cardiovascular:     Rate and Rhythm: Normal rate and regular rhythm.     Pulses: Normal pulses.     Heart sounds: Normal heart sounds.  Pulmonary:     Effort: Pulmonary effort is normal.     Breath sounds: Normal breath sounds.  Chest:     Comments:  Port-a-Cath in R chest Musculoskeletal:     Right lower leg: No edema.     Left lower leg: No edema.  Neurological:     General: No focal deficit present.     Mental Status: He is alert and oriented to person, place, and time.  Psychiatric:        Mood and Affect: Mood normal.        Behavior: Behavior normal.     LABORATORY DATA:  I have reviewed the labs as listed.  CBC Latest Ref Rng & Units 02/13/2020 01/30/2020 01/23/2020  WBC 4.0 - 10.5 K/uL 4.9 7.4 7.4  Hemoglobin 13.0 - 17.0 g/dL 9.2(L) 9.7(L) 9.7(L)  Hematocrit 39.0 - 52.0 % 29.9(L) 30.7(L) 30.9(L)  Platelets 150 - 400 K/uL 151 147(L) 138(L)   CMP Latest Ref Rng & Units 02/13/2020 01/30/2020 01/23/2020  Glucose 70 - 99 mg/dL 95 102(H) 101(H)  BUN 8 - 23 mg/dL 14 13 16   Creatinine 0.61 - 1.24 mg/dL 1.00 0.93 1.08  Sodium 135 - 145 mmol/L 135 133(L) 134(L)  Potassium 3.5 - 5.1 mmol/L 3.9 3.9 4.1  Chloride 98 - 111 mmol/L 102 102 102  CO2 22 - 32 mmol/L 24 25 25   Calcium 8.9 - 10.3 mg/dL 8.4(L) 8.4(L) 8.6(L)  Total Protein 6.5 - 8.1 g/dL 6.4(L) 6.6 6.4(L)  Total Bilirubin 0.3 - 1.2 mg/dL 0.6 0.5 0.7  Alkaline Phos 38 - 126 U/L 62 71 72  AST 15 - 41 U/L 33 31 46(H)  ALT 0 - 44 U/L 27 31 49(H)    DIAGNOSTIC IMAGING:  I have independently reviewed the scans and discussed with the patient. CT Chest W Contrast  Result Date: 01/21/2020 CLINICAL DATA:  Metastatic non-small cell lung cancer, assess treatment response EXAM: CT CHEST WITH CONTRAST TECHNIQUE: Multidetector CT imaging of the chest was performed during intravenous contrast administration. CONTRAST:  41mL OMNIPAQUE IOHEXOL 300 MG/ML  SOLN COMPARISON:  Outside CT chest, 12/16/2019, PET-CT, 10/14/2019, CT chest, 10/06/2019 FINDINGS: Cardiovascular: Right chest port catheter. Aortic atherosclerosis. Normal heart size. Extensive 3 vessel coronary artery calcifications and/or stents. Unchanged trace pericardial effusion. Mediastinum/Nodes: Unchanged post treatment  appearance of soft tissue about the left hilum and AP window (series 2, image 55). No discretely enlarged mediastinal,  hilar, or axillary lymph nodes. Unchanged heterogeneously hypodense nodule of the inferior left lobe of the thyroid. In the setting of significant comorbidities or limited life expectancy, no follow-up recommended (ref: J Am Coll Radiol. 2015 Feb;12(2): 143-50). Trachea, and esophagus demonstrate no significant findings. Lungs/Pleura: Trace left pleural effusion, decreased compared to prior examination. Significant interval decrease in bulk of consolidation of the left upper lobe, with improved aeration. The most dense region of consolidation of the superior left upper lobe measures approximately 5.5 x 3.8 cm in axial extent, previously 6.3 x 5.1 cm when measured similarly (series 2, image 39). Significant interval decrease in extensive irregular nodularity and consolidation of the left lower lobe (series 4, image 95). Upper Abdomen: No acute abnormality. Musculoskeletal: No chest wall mass or suspicious bone lesions identified. IMPRESSION: 1. Significant interval decrease in bulk of consolidation of the left upper lobe, with improved aeration. 2. Significant interval decrease in extensive irregular nodularity and consolidation of the left lower lobe, likely reflecting lymphangitic disease. 3. Unchanged post treatment appearance of soft tissue about the left hilum and AP window. No discretely enlarged mediastinal or hilar lymph nodes. 4. Trace left pleural effusion, decreased compared to prior examination, consistent with improved malignant effusion. 5. Overall constellation of findings is consistent with treatment response. 6. Coronary artery disease. Aortic Atherosclerosis (ICD10-I70.0). Electronically Signed   By: Eddie Candle M.D.   On: 01/21/2020 17:14     ASSESSMENT:  1. Left lung cancer: -Presentation to the ER on 10/06/2019 with cough and dizziness. -CT chest with contrast on 10/06/2019  showed large left suprahilar mass completely obstructing left upper lobe bronchus, measuring 7 x 5.3 x 5.8 cm. Mass extends into AP window and narrows the left mainstem bronchus. Small subpleural nodule in the left lower lobe measuring 4 mm indeterminate. AP window nodal mass measuring 3.4 cm. No supraclavicular adenopathy. Subcarinal enlarged lymph node measures 1.4 cm. -PET scan on 10/14/2019 shows large hypermetabolic left upper lobe mass surrounding the left hilum, extending into pleural surface not invading the chest wall. Direct invasion into the AP window and metastatic subcarinal lymph node, T4 N2 M0. 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 -10 pound weight loss in the last 6 months. Hoarseness for last 1 week. -MRI of the brain on 10/28/2019 with no evidence of metastasis. -Concurrent chemoradiation therapy started on 11/28/2019. Completed 01/02/20 ( chemo) 01/06/2020(radiation) -CT chest on 01/20/2020 showed significant interval decrease in consolidation of the left upper lobe, interval decrease in irregular nodularity and consolidation of the left lower lobe.  Unchanged posttreatment appearance of soft tissue about the left hilum and AP window.  No discretely enlarged mediastinal or hilar lymph nodes.  Trace left pleural effusion, decreased compared to prior exam. -Consolidation durvalumab started on 01/30/2020.  2. Social/family history: -He smoked 1 pack/day for 45 years, quit 5 years ago. -No family history of malignancies.   PLAN:  1.Stage III (T4 N2 M0) squamous cell carcinoma of the left lung: -We reviewed the results of CT scan with the patient. -He has tolerated first cycle of durvalumab very well.  No immunotherapy related side effects noted. -Reviewed labs from today which showed albumin 2.9 and other LFTs normal. -He will proceed with his treatment today and in 2 weeks.  I plan to see him back in 4  weeks.  2.  Cough: -This has improved significantly.  Continue Hycodan twice daily as needed.  3. Atrial fibrillation: -Continue Eliquis 5 mg  twice daily.  No bleeding issues.  4. Nutrition: -He is eating 1 good meal and snacking rest of the time.  He is drinking boost 1 to 2 cans/day.  Weight is improving.  6. Hypertension: -Continue metoprolol 25 mg in the morning and 12.5 mg in the evening.  Orders placed this encounter:  No orders of the defined types were placed in this encounter.    Derek Jack, MD [place] [phone]   I, Milinda Antis, am acting as a scribe for Dr. Sanda Linger.  I, Derek Jack MD, have reviewed the above documentation for accuracy and completeness, and I agree with the above.

## 2020-02-20 ENCOUNTER — Other Ambulatory Visit (HOSPITAL_COMMUNITY): Payer: Self-pay | Admitting: Surgery

## 2020-02-20 DIAGNOSIS — C3492 Malignant neoplasm of unspecified part of left bronchus or lung: Secondary | ICD-10-CM

## 2020-02-20 MED ORDER — HYDROCODONE-HOMATROPINE 5-1.5 MG/5ML PO SYRP: 15.0000 mL | ORAL_SOLUTION | Freq: Three times a day (TID) | ORAL | 0 refills | Status: DC | PRN

## 2020-02-22 ENCOUNTER — Other Ambulatory Visit (HOSPITAL_COMMUNITY): Payer: Self-pay

## 2020-02-22 DIAGNOSIS — C3492 Malignant neoplasm of unspecified part of left bronchus or lung: Secondary | ICD-10-CM

## 2020-02-22 MED ORDER — HYDROCODONE-HOMATROPINE 5-1.5 MG/5ML PO SYRP: 15.0000 mL | ORAL_SOLUTION | Freq: Three times a day (TID) | ORAL | 0 refills | Status: DC | PRN

## 2020-02-27 ENCOUNTER — Inpatient Hospital Stay (HOSPITAL_COMMUNITY): Payer: Medicare Other

## 2020-02-27 ENCOUNTER — Encounter (HOSPITAL_COMMUNITY): Payer: Self-pay

## 2020-02-27 ENCOUNTER — Inpatient Hospital Stay (HOSPITAL_COMMUNITY): Payer: Medicare Other | Attending: Hematology

## 2020-02-27 ENCOUNTER — Other Ambulatory Visit: Payer: Self-pay

## 2020-02-27 VITALS — BP 100/59 | HR 87 | Temp 97.7°F | Resp 18

## 2020-02-27 DIAGNOSIS — Z5112 Encounter for antineoplastic immunotherapy: Secondary | ICD-10-CM | POA: Diagnosis present

## 2020-02-27 DIAGNOSIS — C3492 Malignant neoplasm of unspecified part of left bronchus or lung: Secondary | ICD-10-CM

## 2020-02-27 DIAGNOSIS — C3412 Malignant neoplasm of upper lobe, left bronchus or lung: Secondary | ICD-10-CM | POA: Insufficient documentation

## 2020-02-27 DIAGNOSIS — Z95828 Presence of other vascular implants and grafts: Secondary | ICD-10-CM

## 2020-02-27 LAB — COMPREHENSIVE METABOLIC PANEL
ALT: 20 U/L (ref 0–44)
AST: 31 U/L (ref 15–41)
Albumin: 3.1 g/dL — ABNORMAL LOW (ref 3.5–5.0)
Alkaline Phosphatase: 64 U/L (ref 38–126)
Anion gap: 9 (ref 5–15)
BUN: 14 mg/dL (ref 8–23)
CO2: 24 mmol/L (ref 22–32)
Calcium: 8.5 mg/dL — ABNORMAL LOW (ref 8.9–10.3)
Chloride: 101 mmol/L (ref 98–111)
Creatinine, Ser: 0.98 mg/dL (ref 0.61–1.24)
GFR, Estimated: >60 mL/min (ref >60)
Glucose, Bld: 127 mg/dL — ABNORMAL HIGH (ref 70–99)
Potassium: 4.3 mmol/L (ref 3.5–5.1)
Sodium: 134 mmol/L — ABNORMAL LOW (ref 135–145)
Total Bilirubin: 0.6 mg/dL (ref 0.3–1.2)
Total Protein: 6.7 g/dL (ref 6.5–8.1)

## 2020-02-27 LAB — CBC WITH DIFFERENTIAL/PLATELET
Abs Immature Granulocytes: 0.03 10*3/uL (ref 0.00–0.07)
Basophils Absolute: 0.1 10*3/uL (ref 0.0–0.1)
Basophils Relative: 1 %
Eosinophils Absolute: 0.2 10*3/uL (ref 0.0–0.5)
Eosinophils Relative: 2 %
HCT: 32.3 % — ABNORMAL LOW (ref 39.0–52.0)
Hemoglobin: 9.9 g/dL — ABNORMAL LOW (ref 13.0–17.0)
Immature Granulocytes: 0 %
Lymphocytes Relative: 5 %
Lymphs Abs: 0.4 10*3/uL — ABNORMAL LOW (ref 0.7–4.0)
MCH: 31 pg (ref 26.0–34.0)
MCHC: 30.7 g/dL (ref 30.0–36.0)
MCV: 101.3 fL — ABNORMAL HIGH (ref 80.0–100.0)
Monocytes Absolute: 0.5 10*3/uL (ref 0.1–1.0)
Monocytes Relative: 7 %
Neutro Abs: 5.8 10*3/uL (ref 1.7–7.7)
Neutrophils Relative %: 85 %
Platelets: 178 10*3/uL (ref 150–400)
RBC: 3.19 MIL/uL — ABNORMAL LOW (ref 4.22–5.81)
RDW: 18 % — ABNORMAL HIGH (ref 11.5–15.5)
WBC: 6.9 10*3/uL (ref 4.0–10.5)
nRBC: 0 % (ref 0.0–0.2)

## 2020-02-27 MED ORDER — HEPARIN SOD (PORK) LOCK FLUSH 100 UNIT/ML IV SOLN
500.0000 [IU] | Freq: Once | INTRAVENOUS | Status: AC | PRN
  Administered 2020-02-27: 500 [IU]

## 2020-02-27 MED ORDER — SODIUM CHLORIDE 0.9 % IV SOLN: Freq: Once | INTRAVENOUS | Status: AC

## 2020-02-27 MED ORDER — SODIUM CHLORIDE 0.9 % IV SOLN
10.0000 mg/kg | Freq: Once | INTRAVENOUS | Status: AC
Start: 2020-02-27 — End: 2020-02-27
  Administered 2020-02-27: 740 mg via INTRAVENOUS
  Filled 2020-02-27: qty 10

## 2020-02-27 MED ORDER — SODIUM CHLORIDE 0.9% FLUSH
10.0000 mL | INTRAVENOUS | Status: DC | PRN
  Administered 2020-02-27: 10 mL

## 2020-02-27 NOTE — Progress Notes (Signed)
Patient tolerated therapy with no complaints voiced.  Side effects with management reviewed with understanding verbalized.  Port site clean and dry with no bruising or swelling noted at site.  Good blood return noted before and after administration of therapy.  Band aid applied.  Patient left in satisfactory condition with VSS and no s/s of distress noted.

## 2020-03-01 ENCOUNTER — Ambulatory Visit
Admission: EM | Admit: 2020-03-01 | Discharge: 2020-03-01 | Disposition: A | Discharge status: Home / Self Care | Payer: Medicare Other | Attending: Family Medicine | Admitting: Family Medicine

## 2020-03-01 ENCOUNTER — Other Ambulatory Visit: Payer: Self-pay

## 2020-03-01 DIAGNOSIS — Z20822 Contact with and (suspected) exposure to covid-19: Secondary | ICD-10-CM | POA: Diagnosis not present

## 2020-03-01 NOTE — ED Notes (Signed)
Nurse visit only

## 2020-03-01 NOTE — ED Triage Notes (Signed)
Wants covid test due to exposure from someone living with him. No symptoms

## 2020-03-02 ENCOUNTER — Telehealth: Payer: Self-pay | Admitting: Student

## 2020-03-02 ENCOUNTER — Other Ambulatory Visit: Payer: Self-pay

## 2020-03-02 ENCOUNTER — Telehealth (INDEPENDENT_AMBULATORY_CARE_PROVIDER_SITE_OTHER): Payer: Medicare Other | Admitting: Student

## 2020-03-02 ENCOUNTER — Encounter: Payer: Self-pay | Admitting: Student

## 2020-03-02 VITALS — Ht 64.0 in | Wt 166.0 lb

## 2020-03-02 DIAGNOSIS — C3492 Malignant neoplasm of unspecified part of left bronchus or lung: Secondary | ICD-10-CM

## 2020-03-02 DIAGNOSIS — I4819 Other persistent atrial fibrillation: Secondary | ICD-10-CM

## 2020-03-02 DIAGNOSIS — I10 Essential (primary) hypertension: Secondary | ICD-10-CM | POA: Diagnosis not present

## 2020-03-02 DIAGNOSIS — I2510 Atherosclerotic heart disease of native coronary artery without angina pectoris: Secondary | ICD-10-CM | POA: Diagnosis not present

## 2020-03-02 NOTE — Patient Instructions (Signed)
Medication Instructions:  Your physician recommends that you continue on your current medications as directed. Please refer to the Current Medication list given to you today.  *If you need a refill on your cardiac medications before your next appointment, please call your pharmacy*   Lab Work: NONE   If you have labs (blood work) drawn today and your tests are completely normal, you will receive your results only by: Marland Kitchen MyChart Message (if you have MyChart) OR . A paper copy in the mail If you have any lab test that is abnormal or we need to change your treatment, we will call you to review the results.   Testing/Procedures: NONE    Follow-Up: At [facility], you and your health needs are our priority.  As part of our continuing mission to provide you with exceptional heart care, we have created designated Provider Care Teams.  These Care Teams include your primary Cardiologist (physician) and Advanced Practice Providers (APPs -  Physician Assistants and Nurse Practitioners) who all work together to provide you with the care you need, when you need it.  We recommend signing up for the patient portal called "MyChart".  Sign up information is provided on this After Visit Summary.  MyChart is used to connect with patients for Virtual Visits (Telemedicine).  Patients are able to view lab/test results, encounter notes, upcoming appointments, etc.  Non-urgent messages can be sent to your provider as well.   To learn more about what you can do with MyChart, go to [email or web address].    Your next appointment:   6 month(s)  The format for your next appointment:   In Person  Provider:   Carlyle Dolly, MD or Bernerd Pho, PA-C   Other Instructions Thank you for choosing [facility]!

## 2020-03-02 NOTE — Telephone Encounter (Signed)
  Patient Consent for Virtual Visit         DRAVON NOTT has provided verbal consent on 03/02/2020 for a virtual visit (video or telephone).   CONSENT FOR VIRTUAL VISIT FOR:  Alan [name]  By participating in this virtual visit I agree to the following:  I hereby voluntarily request, consent and authorize [facility] and its employed or contracted physicians, physician assistants, nurse practitioners or other licensed health care professionals (the Practitioner), to provide me with telemedicine health care services (the "Services") as deemed necessary by the treating Practitioner. I acknowledge and consent to receive the Services by the Practitioner via telemedicine. I understand that the telemedicine visit will involve communicating with the Practitioner through live audiovisual communication technology and the disclosure of certain medical information by electronic transmission. I acknowledge that I have been given the opportunity to request an in-person assessment or other available alternative prior to the telemedicine visit and am voluntarily participating in the telemedicine visit.  I understand that I have the right to withhold or withdraw my consent to the use of telemedicine in the course of my care at any time, without affecting my right to future care or treatment, and that the Practitioner or I may terminate the telemedicine visit at any time. I understand that I have the right to inspect all information obtained and/or recorded in the course of the telemedicine visit and may receive copies of available information for a reasonable fee.  I understand that some of the potential risks of receiving the Services via telemedicine include:  Marland Kitchen Delay or interruption in medical evaluation due to technological equipment failure or disruption; . Information transmitted may not be sufficient (e.g. poor resolution of images) to allow for appropriate medical decision making by the Practitioner;  and/or  . In rare instances, security protocols could fail, causing a breach of personal health information.  Furthermore, I acknowledge that it is my responsibility to provide information about my medical history, conditions and care that is complete and accurate to the best of my ability. I acknowledge that Practitioner's advice, recommendations, and/or decision may be based on factors not within their control, such as incomplete or inaccurate data provided by me or distortions of diagnostic images or specimens that may result from electronic transmissions. I understand that the practice of medicine is not an exact science and that Practitioner makes no warranties or guarantees regarding treatment outcomes. I acknowledge that a copy of this consent can be made available to me via my patient portal ([facility]), or I can request a printed copy by calling the office of [facility].    I understand that my insurance will be billed for this visit.   I have read or had this consent read to me. . I understand the contents of this consent, which adequately explains the benefits and risks of the Services being provided via telemedicine.  . I have been provided ample opportunity to ask questions regarding this consent and the Services and have had my questions answered to my satisfaction. . I give my informed consent for the services to be provided through the use of telemedicine in my medical care

## 2020-03-02 NOTE — Progress Notes (Signed)
Virtual Visit via Telephone Note   This visit type was conducted due to national recommendations for restrictions regarding the COVID-19 Pandemic (e.g. social distancing) in an effort to limit this patient's exposure and mitigate transmission in our community.  Due to his co-morbid illnesses, this patient is at least at moderate risk for complications without adequate follow up.  This format is felt to be most appropriate for this patient at this time.  The patient did not have access to video technology/had technical difficulties with video requiring transitioning to audio format only (telephone).  All issues noted in this document were discussed and addressed.  No physical exam could be performed with this format.  Please refer to the patient's chart for his  consent to telehealth for [facility]   Date:  03/03/2020   ID:  Alan [name], DOB [birth year], MRN [id number] The patient was identified using 2 identifiers.  Patient Location: Home Provider Location: Office/Clinic  PCP:  Patient, No Pcp Per  Cardiologist:  Carlyle Dolly, MD  Electrophysiologist:  None   Evaluation Performed:  Follow-Up Visit  Chief Complaint: 38-month visit  History of Present Illness:    Alan [name] is a 70 y.o. male with past medical history of HTN, Hyperthyroidism, mild nonobstructive CAD by cath in 2008, persistent atrial fibrillation (diagnosed in 10/2019), lung cancer (diagnosed in 09/2019 - undergoing chemotherapy and radiation) and prior tobacco use who presents for a 52-month telehealth visit.  He was last examined by myself in 12/2019 and reported feeling fatigued in the setting of undergoing chemotherapy and radiation. He did have baseline dyspnea on exertion but denied any acute changes in this or associated chest pain or palpitations. Lopressor had been reduced to 12.5 mg twice daily by Oncology due to hypotension. Given that his heart rate was elevated in the 110's during his visit,  Lopressor was titrated to 25mg  in AM/12.5mg  in PM. He was continued on Eliquis 5 mg twice daily for anticoagulation.  By review of notes, the patient did present to the ED on 03/01/2020 for a COVID test given a recent exposure. This has not yet resulted on the day of his visit, therefore he switched to a Virtual Visit.   In talking with the patient today, he reports overall doing well since his last office visit. He is receiving immunotherapy for his lung cancer and reports overall tolerating this well. He does have dyspnea on exertion but denies any chest pain or palpitations. No recent orthopnea, PND or edema. Reports his heart rate has overall been well controlled when going for his cancer treatments. Vitals from his recent ED visit on 03/01/2020 showed BP was stable at 120/69 and heart rate was at 83.  He does have a pending COVID test but denies any symptoms concerning for COVID-19 infection (fever, chills, cough, or new shortness of breath).    Past Medical History:  Diagnosis Date  . Arthritis   . Hyperlipemia   . Hypertension   . Hyperthyroidism   . Myocardial infarction ([place])   . Paroxysmal atrial fibrillation ([facility])   . Port-A-Cath in place 11/14/2019  . Tachycardia    Past Surgical History:  Procedure Laterality Date  . CARDIAC CATHETERIZATION    . PORTACATH PLACEMENT Right 11/01/2019   Procedure: INSERTION PORT-A-CATH;  Surgeon: Aviva Signs, MD;  Location: [facility];  Service: General;  Laterality: Right;     Current Meds  Medication Sig  . acetaminophen (TYLENOL) 500 MG tablet Take 500 mg by mouth  every 6 (six) hours as needed for moderate pain or headache.  . albuterol (PROVENTIL) (2.5 MG/3ML) 0.083% nebulizer solution Take 3 mLs (2.5 mg total) by nebulization every 6 (six) hours as needed for wheezing or shortness of breath.  . ALPRAZolam (XANAX) 0.25 MG tablet TAKE 1 TABLET BY MOUTH THREE TIMES DAILY AS NEEDED FOR ANXIETY  . alum & mag hydroxide-simeth (MAALOX MAX)  063-016-01 MG/5ML suspension Please take 1 tablespoon by mouth four times daily with meals.  Marland Kitchen apixaban (ELIQUIS) 5 MG TABS tablet Take 1 tablet (5 mg total) by mouth 2 (two) times daily.  Marland Kitchen HYDROcodone-homatropine (HYCODAN) 5-1.5 MG/5ML syrup Take 15 mLs by mouth every 8 (eight) hours as needed for cough.  . metoprolol tartrate (LOPRESSOR) 25 MG tablet Take one tablet(25 mg )  in the AM and take 1/2 Tablet (12.5mg ) in the PM     Allergies:   Patient has no known allergies.   Social History   Tobacco Use  . Smoking status: Former Smoker    Years: 45.00    Types: 56, Cigarettes    Quit date: 10/19/2014    Years since quitting: 5.3  . Smokeless tobacco: Never Used  Vaping Use  . Vaping Use: Never used  Substance Use Topics  . Alcohol use: Never  . Drug use: No     Family Hx: The patient's family history includes Dementia in his father; Heart disease in his father; Heart failure in an other family member; Hypertension in his father.  ROS:   Please see the history of present illness.     All other systems reviewed and are negative.   Prior CV studies:   The following studies were reviewed today:  Echocardiogram: 10/2019 IMPRESSIONS    1. Left ventricular ejection fraction, by estimation, is 65 to 70%. The  left ventricle has normal function. The left ventricle has no regional  wall motion abnormalities. There is moderate left ventricular hypertrophy.  Left ventricular diastolic  parameters are indeterminate.  2. Right ventricular systolic function is normal. The right ventricular  size is normal.  3. A small pericardial effusion is present. The pericardial effusion is  circumferential. There is no evidence of cardiac tamponade.  4. The mitral valve is normal in structure. Trivial mitral valve  regurgitation. No evidence of mitral stenosis.  5. The aortic valve has an indeterminant number of cusps. Aortic valve  regurgitation is not visualized. No aortic stenosis  is present.  6. The inferior vena cava is normal in size with greater than 50%  respiratory variability, suggesting right atrial pressure of 3 mmHg.    Labs/Other Tests and Data Reviewed:    EKG:  No ECG reviewed.  Recent Labs: 01/09/2020: Magnesium 1.8 02/13/2020: TSH 0.944 02/27/2020: ALT 20; BUN 14; Creatinine, Ser 0.98; Hemoglobin 9.9; Platelets 178; Potassium 4.3; Sodium 134   Recent Lipid Panel No results found for: CHOL, TRIG, HDL, CHOLHDL, LDLCALC, LDLDIRECT  Wt Readings from Last 3 Encounters:  03/02/20 166 lb (75.3 kg)  02/27/20 166 lb 3.2 oz (75.4 kg)  02/13/20 165 lb (74.8 kg)     Risk Assessment/Calculations:     CHA2DS2-VASc Score = 3  This indicates a 3.2% annual risk of stroke. The patient's score is based upon: CHF History: No HTN History: Yes Diabetes History: No Stroke History: No Vascular Disease History: Yes Age Score: 1 Gender Score: 0   Objective:    Vital Signs:  Ht 5\' 4"  (1.626 m)   Wt 166 lb (75.3 kg)  BMI 28.49 kg/m    General: Pleasant male sounding in NAD.  Psych: Normal affect. Neuro: Alert and oriented X 3.  Lungs:  Resp regular and unlabored while talking on the phone.   ASSESSMENT & PLAN:    1. Persistent Atrial Fibrillation - He denies any recent palpitations and his HR has been well-controlled when checked at recent outpatient visits. Continue Lopressor 25mg  in AM/12.5mg  in PM. Previously unable to tolerate higher dosing secondary to hypotension.  - He denies any evidence of active bleeding. Remains on Eliquis 5mg  BID for anticoagulation. Hgb was stable at 9.9 by most recent labs earlier this week.   2. HTN - BP has been soft when checked at prior visits but was at 120/69 yesterday. Continue Lopressor at current dosing.   3. History of CAD - He had nonobstructive CAD by cath in 2008. He denies any recent chest pain but does have baseline dyspnea on exertion in the setting of his lung cancer.  - Not on ASA given the need  for anticoagulation. He is not currently on statin therapy. Will check FLP with upcoming labs for risk stratification.   4. Lung Cancer - Followed by Oncology. Currently on immunotherapy.    COVID-19 Education: The signs and symptoms of COVID-19 were discussed with the patient and how to seek care for testing (follow up with PCP or arrange E-visit). The importance of social distancing was discussed today.  Time:   Today, I have spent 9 minutes with the patient with telehealth technology discussing the above problems.     Medication Adjustments/Labs and Tests Ordered: Current medicines are reviewed at length with the patient today.  Concerns regarding medicines are outlined above.   Tests Ordered: Orders Placed This Encounter  Procedures  . Lipid Profile    Medication Changes: No orders of the defined types were placed in this encounter.   Follow Up:  In Person in 6 month(s)  Signed, Erma Heritage, PA-C  03/03/2020 10:21 AM    [place]

## 2020-03-03 ENCOUNTER — Encounter: Payer: Self-pay | Admitting: Student

## 2020-03-03 LAB — NOVEL CORONAVIRUS, NAA: SARS-CoV-2, NAA: DETECTED — AB

## 2020-03-03 LAB — SARS-COV-2, NAA 2 DAY TAT

## 2020-03-04 ENCOUNTER — Encounter: Payer: Self-pay | Admitting: Physician Assistant

## 2020-03-04 ENCOUNTER — Other Ambulatory Visit: Payer: Self-pay | Admitting: Physician Assistant

## 2020-03-04 DIAGNOSIS — Z720 Tobacco use: Secondary | ICD-10-CM

## 2020-03-04 DIAGNOSIS — Z95828 Presence of other vascular implants and grafts: Secondary | ICD-10-CM

## 2020-03-04 DIAGNOSIS — I4891 Unspecified atrial fibrillation: Secondary | ICD-10-CM

## 2020-03-04 DIAGNOSIS — U071 COVID-19: Secondary | ICD-10-CM

## 2020-03-04 DIAGNOSIS — C3490 Malignant neoplasm of unspecified part of unspecified bronchus or lung: Secondary | ICD-10-CM | POA: Insufficient documentation

## 2020-03-04 DIAGNOSIS — C3492 Malignant neoplasm of unspecified part of left bronchus or lung: Secondary | ICD-10-CM

## 2020-03-04 NOTE — Progress Notes (Signed)
I connected by phone with [name] on 03/04/2020 at 3:49 PM to discuss the potential use of a new treatment for mild to moderate COVID-19 viral infection in non-hospitalized patients.  This patient is a 70 y.o. male that meets the FDA criteria for Emergency Use Authorization of COVID monoclonal antibody sotrovimab, casirivimab/imdevimab or bamlamivimab/estevimab.  Has a (+) direct SARS-CoV-2 viral test result  Has mild or moderate COVID-19   Is NOT hospitalized due to COVID-19  Is within 10 days of symptom onset  Has at least one of the high risk factor(s) for progression to severe COVID-19 and/or hospitalization as defined in EUA.  Specific high risk criteria : Older age (>/= 71 yo), BMI > 25, Immunosuppressive Disease or Treatment, Cardiovascular disease or hypertension and Other high risk medical condition per CDC:  high SVI   I have spoken and communicated the following to the patient or parent/caregiver regarding COVID monoclonal antibody treatment:  1. FDA has authorized the emergency use for the treatment of mild to moderate COVID-19 in adults and pediatric patients with positive results of direct SARS-CoV-2 viral testing who are 19 years of age and older weighing at least 40 kg, and who are at high risk for progressing to severe COVID-19 and/or hospitalization.  2. The significant known and potential risks and benefits of COVID monoclonal antibody, and the extent to which such potential risks and benefits are unknown.  3. Information on available alternative treatments and the risks and benefits of those alternatives, including clinical trials.  4. Patients treated with COVID monoclonal antibody should continue to self-isolate and use infection control measures (e.g., wear mask, isolate, social distance, avoid sharing personal items, clean and disinfect "high touch" surfaces, and frequent handwashing) according to CDC guidelines.   5. The patient or parent/caregiver has the  option to accept or refuse COVID monoclonal antibody treatment.  After reviewing this information with the patient, the patient has agreed to receive one of the available covid 19 monoclonal antibodies and will be provided an appropriate fact sheet prior to infusion.  Sx onset 1/16. Set up for infusion on 1/17 @ . Directions given to [facility]. Pt is aware that insurance will be charged an infusion fee.   Angelena Form 03/04/2020 3:49 PM

## 2020-03-05 ENCOUNTER — Ambulatory Visit (HOSPITAL_COMMUNITY): Payer: Medicare Other

## 2020-03-06 ENCOUNTER — Ambulatory Visit (HOSPITAL_COMMUNITY)
Admission: RE | Admit: 2020-03-06 | Discharge: 2020-03-06 | Disposition: A | Discharge status: Home / Self Care | Payer: Medicare Other | Source: Ambulatory Visit | Attending: Pulmonary Disease | Admitting: Pulmonary Disease

## 2020-03-06 DIAGNOSIS — Z95828 Presence of other vascular implants and grafts: Secondary | ICD-10-CM | POA: Diagnosis not present

## 2020-03-06 DIAGNOSIS — Z720 Tobacco use: Secondary | ICD-10-CM

## 2020-03-06 DIAGNOSIS — U071 COVID-19: Secondary | ICD-10-CM

## 2020-03-06 DIAGNOSIS — C3492 Malignant neoplasm of unspecified part of left bronchus or lung: Secondary | ICD-10-CM

## 2020-03-06 DIAGNOSIS — I4891 Unspecified atrial fibrillation: Secondary | ICD-10-CM

## 2020-03-06 MED ORDER — ALBUTEROL SULFATE HFA 108 (90 BASE) MCG/ACT IN AERS: 2.0000 | INHALATION_SPRAY | Freq: Once | RESPIRATORY_TRACT | Status: DC | PRN

## 2020-03-06 MED ORDER — EPINEPHRINE 0.3 MG/0.3ML IJ SOAJ: 0.3000 mg | Freq: Once | INTRAMUSCULAR | Status: DC | PRN

## 2020-03-06 MED ORDER — FAMOTIDINE IN NACL 20-0.9 MG/50ML-% IV SOLN: 20.0000 mg | Freq: Once | INTRAVENOUS | Status: DC | PRN

## 2020-03-06 MED ORDER — DIPHENHYDRAMINE HCL 50 MG/ML IJ SOLN: 50.0000 mg | Freq: Once | INTRAMUSCULAR | Status: DC | PRN

## 2020-03-06 MED ORDER — METHYLPREDNISOLONE SODIUM SUCC 125 MG IJ SOLR: 125.0000 mg | Freq: Once | INTRAMUSCULAR | Status: DC | PRN

## 2020-03-06 MED ORDER — HEPARIN SOD (PORK) LOCK FLUSH 100 UNIT/ML IV SOLN
500.0000 [IU] | INTRAVENOUS | Status: AC | PRN
  Administered 2020-03-06: 500 [IU]

## 2020-03-06 MED ORDER — SODIUM CHLORIDE 0.9 % IV SOLN: INTRAVENOUS | Status: DC | PRN

## 2020-03-06 MED ORDER — SOTROVIMAB 500 MG/8ML IV SOLN
500.0000 mg | Freq: Once | INTRAVENOUS | Status: AC
  Administered 2020-03-06: 500 mg via INTRAVENOUS

## 2020-03-06 NOTE — Progress Notes (Signed)
Diagnosis: COVID-19  Physician: Dr. Patrick Wright  Procedure: Covid Infusion Clinic Med: Sotrovimab infusion - Provided patient with sotrovimab fact sheet for patients, parents, and caregivers prior to infusion.   Complications: No immediate complications noted  Discharge: Discharged home    

## 2020-03-06 NOTE — Progress Notes (Signed)
Patient reviewed Fact Sheet for Patients, Parents, and Caregivers for Emergency Use Authorization (EUA) of sotrovimab for the Treatment of Coronavirus. Patient also reviewed and is agreeable to the estimated cost of treatment. Patient is agreeable to proceed.   

## 2020-03-06 NOTE — Discharge Instructions (Signed)

## 2020-03-09 ENCOUNTER — Telehealth (HOSPITAL_COMMUNITY): Payer: Self-pay

## 2020-03-09 NOTE — Telephone Encounter (Signed)
This nurse received call from patients wife who stated patient was diagnosed with Covid on 03/01/20 and then on Tuesday 1/18 the patient received the antibody infusion.  He is scheduled for his next Carboplatin treatment on 1/24 and they needed to know should he still come for the treatment.  This nurse explained to the wife the wait time of 21 days from day of diagnosis before he can return to the clinic.  Advised that the scheduling department will be giving them a call to reschedule his appointment for a day that will fall after the completion of his 21 day waiting period.  Wife acknowledged understanding.  No further questions or concerns at this time.

## 2020-03-12 ENCOUNTER — Ambulatory Visit (HOSPITAL_COMMUNITY): Payer: Medicare Other

## 2020-03-12 ENCOUNTER — Ambulatory Visit (HOSPITAL_COMMUNITY): Payer: Medicare Other | Admitting: Hematology

## 2020-03-12 ENCOUNTER — Other Ambulatory Visit (HOSPITAL_COMMUNITY): Payer: Medicare Other

## 2020-03-26 ENCOUNTER — Ambulatory Visit (HOSPITAL_COMMUNITY): Payer: Medicare Other | Admitting: Hematology

## 2020-03-26 ENCOUNTER — Other Ambulatory Visit (HOSPITAL_COMMUNITY): Payer: Medicare Other

## 2020-03-26 ENCOUNTER — Ambulatory Visit (HOSPITAL_COMMUNITY): Payer: Medicare Other

## 2020-04-02 ENCOUNTER — Inpatient Hospital Stay (HOSPITAL_COMMUNITY): Payer: Medicare Other | Attending: Hematology

## 2020-04-02 ENCOUNTER — Inpatient Hospital Stay (HOSPITAL_BASED_OUTPATIENT_CLINIC_OR_DEPARTMENT_OTHER): Payer: Medicare Other | Admitting: Hematology

## 2020-04-02 ENCOUNTER — Other Ambulatory Visit: Payer: Self-pay

## 2020-04-02 ENCOUNTER — Inpatient Hospital Stay (HOSPITAL_COMMUNITY): Payer: Medicare Other

## 2020-04-02 ENCOUNTER — Encounter (HOSPITAL_COMMUNITY): Payer: Self-pay | Admitting: Hematology

## 2020-04-02 VITALS — BP 137/87 | HR 80 | Temp 96.9°F | Resp 18 | Wt 168.4 lb

## 2020-04-02 VITALS — BP 105/64 | HR 87 | Temp 97.1°F | Resp 18

## 2020-04-02 DIAGNOSIS — C3492 Malignant neoplasm of unspecified part of left bronchus or lung: Secondary | ICD-10-CM

## 2020-04-02 DIAGNOSIS — R059 Cough, unspecified: Secondary | ICD-10-CM | POA: Diagnosis not present

## 2020-04-02 DIAGNOSIS — I10 Essential (primary) hypertension: Secondary | ICD-10-CM | POA: Diagnosis not present

## 2020-04-02 DIAGNOSIS — Z5112 Encounter for antineoplastic immunotherapy: Secondary | ICD-10-CM | POA: Insufficient documentation

## 2020-04-02 DIAGNOSIS — Z95828 Presence of other vascular implants and grafts: Secondary | ICD-10-CM

## 2020-04-02 DIAGNOSIS — Z79899 Other long term (current) drug therapy: Secondary | ICD-10-CM | POA: Diagnosis not present

## 2020-04-02 DIAGNOSIS — C3412 Malignant neoplasm of upper lobe, left bronchus or lung: Secondary | ICD-10-CM | POA: Diagnosis present

## 2020-04-02 LAB — COMPREHENSIVE METABOLIC PANEL
ALT: 11 U/L (ref 0–44)
AST: 17 U/L (ref 15–41)
Albumin: 3.2 g/dL — ABNORMAL LOW (ref 3.5–5.0)
Alkaline Phosphatase: 58 U/L (ref 38–126)
Anion gap: 7 (ref 5–15)
BUN: 16 mg/dL (ref 8–23)
CO2: 25 mmol/L (ref 22–32)
Calcium: 8.9 mg/dL (ref 8.9–10.3)
Chloride: 104 mmol/L (ref 98–111)
Creatinine, Ser: 0.93 mg/dL (ref 0.61–1.24)
GFR, Estimated: >60 mL/min (ref >60)
Glucose, Bld: 110 mg/dL — ABNORMAL HIGH (ref 70–99)
Potassium: 3.8 mmol/L (ref 3.5–5.1)
Sodium: 136 mmol/L (ref 135–145)
Total Bilirubin: 0.4 mg/dL (ref 0.3–1.2)
Total Protein: 6.7 g/dL (ref 6.5–8.1)

## 2020-04-02 LAB — CBC WITH DIFFERENTIAL/PLATELET
Abs Immature Granulocytes: 0.02 10*3/uL (ref 0.00–0.07)
Basophils Absolute: 0 10*3/uL (ref 0.0–0.1)
Basophils Relative: 1 %
Eosinophils Absolute: 0.2 10*3/uL (ref 0.0–0.5)
Eosinophils Relative: 3 %
HCT: 34.5 % — ABNORMAL LOW (ref 39.0–52.0)
Hemoglobin: 10.8 g/dL — ABNORMAL LOW (ref 13.0–17.0)
Immature Granulocytes: 0 %
Lymphocytes Relative: 6 %
Lymphs Abs: 0.4 10*3/uL — ABNORMAL LOW (ref 0.7–4.0)
MCH: 31.3 pg (ref 26.0–34.0)
MCHC: 31.3 g/dL (ref 30.0–36.0)
MCV: 100 fL (ref 80.0–100.0)
Monocytes Absolute: 0.5 10*3/uL (ref 0.1–1.0)
Monocytes Relative: 7 %
Neutro Abs: 6.1 10*3/uL (ref 1.7–7.7)
Neutrophils Relative %: 83 %
Platelets: 140 10*3/uL — ABNORMAL LOW (ref 150–400)
RBC: 3.45 MIL/uL — ABNORMAL LOW (ref 4.22–5.81)
RDW: 14.2 % (ref 11.5–15.5)
WBC: 7.2 10*3/uL (ref 4.0–10.5)
nRBC: 0 % (ref 0.0–0.2)

## 2020-04-02 LAB — TSH: TSH: 0.047 u[IU]/mL — ABNORMAL LOW (ref 0.350–4.500)

## 2020-04-02 MED ORDER — SODIUM CHLORIDE 0.9 % IV SOLN
10.0000 mg/kg | Freq: Once | INTRAVENOUS | Status: AC
  Administered 2020-04-02: 740 mg via INTRAVENOUS
  Filled 2020-04-02: qty 10

## 2020-04-02 MED ORDER — SODIUM CHLORIDE 0.9 % IV SOLN: Freq: Once | INTRAVENOUS | Status: AC

## 2020-04-02 MED ORDER — SODIUM CHLORIDE 0.9% FLUSH
10.0000 mL | INTRAVENOUS | Status: DC | PRN
  Administered 2020-04-02: 10 mL

## 2020-04-02 MED ORDER — HEPARIN SOD (PORK) LOCK FLUSH 100 UNIT/ML IV SOLN
500.0000 [IU] | Freq: Once | INTRAVENOUS | Status: AC | PRN
  Administered 2020-04-02: 500 [IU]

## 2020-04-02 NOTE — Progress Notes (Signed)
[place], [place] [address]   CLINIC:  Medical Oncology/Hematology  PCP:  Patient, No Pcp Per None None   REASON FOR VISIT:  Follow-up for left squamous cell lung cancer  PRIOR THERAPY: Concurrent chemoradiation with weekly carboplatin and paclitaxel x 6 cycles from 11/28/2019 to 01/02/2020  NGS Results: Not done  CURRENT THERAPY: Durvalumab every 2 weeks  BRIEF ONCOLOGIC HISTORY:  Oncology History  Squamous cell lung cancer, left ([place])  11/08/2019 Initial Diagnosis   Squamous cell lung cancer, left ([place])   11/08/2019 Cancer Staging   Staging form: Lung, AJCC 8th Edition - Clinical stage from 11/08/2019: Stage IIIB (cT4, cN2, cM0) - Signed by Derek Jack, MD on 11/08/2019   11/28/2019 - 01/02/2020 Chemotherapy   The patient had palonosetron (ALOXI) injection 0.25 mg, 0.25 mg, Intravenous,  Once, 6 of 6 cycles Administration: 0.25 mg (11/28/2019), 0.25 mg (12/05/2019), 0.25 mg (12/12/2019), 0.25 mg (12/19/2019), 0.25 mg (12/26/2019), 0.25 mg (01/02/2020) CARBOplatin (PARAPLATIN) 180 mg in sodium chloride 0.9 % 250 mL chemo infusion, 180 mg (100 % of original dose 176 mg), Intravenous,  Once, 6 of 6 cycles Dose modification:   (original dose 176 mg, Cycle 1),   (original dose 201.2 mg, Cycle 2),   (original dose 179.2 mg, Cycle 3),   (original dose 179.2 mg, Cycle 4),   (original dose 195.4 mg, Cycle 5),   (original dose 178.2 mg, Cycle 6) Administration: 180 mg (11/28/2019), 200 mg (12/05/2019), 180 mg (12/12/2019), 180 mg (12/19/2019), 190 mg (12/26/2019), 180 mg (01/02/2020) PACLitaxel (TAXOL) 84 mg in sodium chloride 0.9 % 250 mL chemo infusion (</= 80mg /m2), 45 mg/m2 = 84 mg, Intravenous,  Once, 6 of 6 cycles Administration: 84 mg (11/28/2019), 84 mg (12/05/2019), 84 mg (12/12/2019), 84 mg (12/19/2019), 84 mg (12/26/2019), 84 mg (01/02/2020)  for chemotherapy treatment.    01/30/2020 -  Chemotherapy    Patient is on Treatment Plan:  LUNG DURVALUMAB Q14D        CANCER STAGING: Cancer Staging Squamous cell lung cancer, left ([place]) Staging form: Lung, AJCC 8th Edition - Clinical stage from 11/08/2019: Stage IIIB (cT4, cN2, cM0) - Signed by Derek Jack, MD on 11/08/2019   INTERVAL HISTORY:  Mr. MAZI BRAILSFORD, a 70 y.o. male, returns for routine follow-up and consideration for next cycle of immunotherapy. Jennie was last seen on 02/13/2020.  Due for cycle #4 of durvalumab today.   Today he is accompanied by his wife. Overall, he tells me he has been feeling okay. He was diagnosed with COVID on 03/01/2020 while only having a fever and his cough has improved since then. He continues taking Eliquis BID. He denies having any diarrhea or rash, though he complains of having itching on his arms. His appetite is okay and he is drinking 1-2 cans of Boost daily.  Overall, he feels ready for next cycle of immunotherapy today.    REVIEW OF SYSTEMS:  Review of Systems  Constitutional: Positive for appetite change (60%) and fatigue (60%).  Respiratory: Positive for cough (improving) and shortness of breath (w/ exertion).   Skin: Positive for itching (on arms d/t dry skin). Negative for rash.  All other systems reviewed and are negative.   PAST MEDICAL/SURGICAL HISTORY:  Past Medical History:  Diagnosis Date  . Arthritis   . Hyperlipemia   . Hypertension   . Hyperthyroidism   . Lung cancer ([place])   . Myocardial infarction ([place])   . Paroxysmal atrial fibrillation ([facility])   .  Port-A-Cath in place 11/14/2019  . Tachycardia    Past Surgical History:  Procedure Laterality Date  . CARDIAC CATHETERIZATION    . PORTACATH PLACEMENT Right 11/01/2019   Procedure: INSERTION PORT-A-CATH;  Surgeon: Aviva Signs, MD;  Location: [facility];  Service: General;  Laterality: Right;    SOCIAL HISTORY:  Social History   Socioeconomic History  . Marital status: Married    Spouse name: Not on file  . Number of children: 1  . Years  of education: Not on file  . Highest education level: Not on file  Occupational History  . Occupation: retired  . Occupation: Glass blower/designer: [employer]  Tobacco Use  . Smoking status: Former Smoker    Years: 45.00    Types: 71, Cigarettes    Quit date: 10/19/2014    Years since quitting: 5.4  . Smokeless tobacco: Never Used  Vaping Use  . Vaping Use: Never used  Substance and Sexual Activity  . Alcohol use: Never  . Drug use: No  . Sexual activity: Not on file  Other Topics Concern  . Not on file  Social History Narrative  . Not on file   Social Determinants of Health   Financial Resource Strain: Medium Risk  . Difficulty of Paying Living Expenses: Somewhat hard  Food Insecurity: No Food Insecurity  . Worried About Charity fundraiser in the Last Year: Never true  . Ran Out of Food in the Last Year: Never true  Transportation Needs: No Transportation Needs  . Lack of Transportation (Medical): No  . Lack of Transportation (Non-Medical): No  Physical Activity: Inactive  . Days of Exercise per Week: 0 days  . Minutes of Exercise per Session: 0 min  Stress: Stress Concern Present  . Feeling of Stress : Very much  Social Connections: Moderately Isolated  . Frequency of Communication with Friends and Family: More than three times a week  . Frequency of Social Gatherings with Friends and Family: More than three times a week  . Attends Religious Services: Never  . Active Member of Clubs or Organizations: No  . Attends Archivist Meetings: Never  . Marital Status: Married  Human resources officer Violence: Not At Risk  . Fear of Current or Ex-Partner: No  . Emotionally Abused: No  . Physically Abused: No  . Sexually Abused: No    FAMILY HISTORY:  Family History  Problem Relation Age of Onset  . Hypertension Father   . Dementia Father        died age 77  . Heart disease Father   . Heart failure Other     CURRENT MEDICATIONS:  Current Outpatient  Medications  Medication Sig Dispense Refill  . acetaminophen (TYLENOL) 500 MG tablet Take 500 mg by mouth every 6 (six) hours as needed for moderate pain or headache.    . albuterol (PROVENTIL) (2.5 MG/3ML) 0.083% nebulizer solution Take 3 mLs (2.5 mg total) by nebulization every 6 (six) hours as needed for wheezing or shortness of breath. 75 mL 12  . ALPRAZolam (XANAX) 0.25 MG tablet TAKE 1 TABLET BY MOUTH THREE TIMES DAILY AS NEEDED FOR ANXIETY 30 tablet 0  . alum & mag hydroxide-simeth (MAALOX MAX) 676-195-09 MG/5ML suspension Please take 1 tablespoon by mouth four times daily with meals. 480 mL 2  . apixaban (ELIQUIS) 5 MG TABS tablet Take 1 tablet (5 mg total) by mouth 2 (two) times daily. 60 tablet 6  . HYDROcodone-homatropine (HYCODAN) 5-1.5 MG/5ML syrup Take  15 mLs by mouth every 8 (eight) hours as needed for cough. 480 mL 0  . lidocaine-prilocaine (EMLA) cream Apply 1 application topically as needed.    . metoprolol tartrate (LOPRESSOR) 25 MG tablet Take one tablet(25 mg )  in the AM and take 1/2 Tablet (12.5mg ) in the PM 135 tablet 3   No current facility-administered medications for this visit.    ALLERGIES:  No Known Allergies  PHYSICAL EXAM:  Performance status (ECOG): 1 - Symptomatic but completely ambulatory  Vitals:   04/02/20 0907  BP: 137/87  Pulse: 80  Resp: 18  Temp: (!) 96.9 F (36.1 C)  SpO2: 97%   Wt Readings from Last 3 Encounters:  04/02/20 168 lb 6.4 oz (76.4 kg)  03/02/20 166 lb (75.3 kg)  02/27/20 166 lb 3.2 oz (75.4 kg)   Physical Exam Vitals reviewed.  Constitutional:      Appearance: Normal appearance.  Cardiovascular:     Rate and Rhythm: Normal rate and regular rhythm.     Pulses: Normal pulses.     Heart sounds: Normal heart sounds.  Pulmonary:     Effort: Pulmonary effort is normal.     Breath sounds: Normal breath sounds.  Chest:     Comments: Port-a-Cath in R chest Neurological:     General: No focal deficit present.     Mental  Status: He is alert and oriented to person, place, and time.  Psychiatric:        Mood and Affect: Mood normal.        Behavior: Behavior normal.     LABORATORY DATA:   I have reviewed the labs as listed.  CBC Latest Ref Rng & Units 04/02/2020 02/27/2020 02/13/2020  WBC 4.0 - 10.5 K/uL 7.2 6.9 4.9  Hemoglobin 13.0 - 17.0 g/dL 10.8(L) 9.9(L) 9.2(L)  Hematocrit 39.0 - 52.0 % 34.5(L) 32.3(L) 29.9(L)  Platelets 150 - 400 K/uL 140(L) 178 151   CMP Latest Ref Rng & Units 02/27/2020 02/13/2020 01/30/2020  Glucose 70 - 99 mg/dL 127(H) 95 102(H)  BUN 8 - 23 mg/dL 14 14 13   Creatinine 0.61 - 1.24 mg/dL 0.98 1.00 0.93  Sodium 135 - 145 mmol/L 134(L) 135 133(L)  Potassium 3.5 - 5.1 mmol/L 4.3 3.9 3.9  Chloride 98 - 111 mmol/L 101 102 102  CO2 22 - 32 mmol/L 24 24 25   Calcium 8.9 - 10.3 mg/dL 8.5(L) 8.4(L) 8.4(L)  Total Protein 6.5 - 8.1 g/dL 6.7 6.4(L) 6.6  Total Bilirubin 0.3 - 1.2 mg/dL 0.6 0.6 0.5  Alkaline Phos 38 - 126 U/L 64 62 71  AST 15 - 41 U/L 31 33 31  ALT 0 - 44 U/L 20 27 31     DIAGNOSTIC IMAGING:  I have independently reviewed the scans and discussed with the patient. No results found.   ASSESSMENT:  1. Left lung cancer: -Presentation to the ER on 10/06/2019 with cough and dizziness. -CT chest with contrast on 10/06/2019 showed large left suprahilar mass completely obstructing left upper lobe bronchus, measuring 7 x 5.3 x 5.8 cm. Mass extends into AP window and narrows the left mainstem bronchus. Small subpleural nodule in the left lower lobe measuring 4 mm indeterminate. AP window nodal mass measuring 3.4 cm. No supraclavicular adenopathy. Subcarinal enlarged lymph node measures 1.4 cm. -PET scan on 10/14/2019 shows large hypermetabolic left upper lobe mass surrounding the left hilum, extending into pleural surface not invading the chest wall. Direct invasion into the AP window and metastatic subcarinal lymph node, T4 N2  M0. 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 -10 pound weight loss in the last 6 months. Hoarseness for last 1 week. -MRI of the brain on 10/28/2019 with no evidence of metastasis. -Concurrent chemoradiation therapy started on 11/28/2019.Completed 01/02/20 ( chemo) 01/06/2020(radiation) -CT chest on 01/20/2020 showed significant interval decrease in consolidation of the left upper lobe, interval decrease in irregular nodularity and consolidation of the left lower lobe.  Unchanged posttreatment appearance of soft tissue about the left hilum and AP window.  No discretely enlarged mediastinal or hilar lymph nodes.  Trace left pleural effusion, decreased compared to prior exam. -Consolidation durvalumab started on 01/30/2020.  2. Social/family history: -He smoked 1 pack/day for 45 years, quit 5 years ago. -No family history of malignancies.   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  He had a Covid infection after that and missed the next dose.  His breathing has been stable. -Reviewed his labs which showed normal LFTs.  No immunotherapy related side effects.  Mild thrombocytopenia with platelet count 140.  TSH has decreased to 0.047.  We will closely monitor.  -Will proceed with his next dose of Durvalumab today and in 2 weeks.  RTC 4 weeks with repeat CT of the chest.  2.  Cough: -Continue Hycodan twice daily as needed.  3. Atrial fibrillation: -Continue Eliquis 5 mg twice daily.    4. Nutrition: -He is eating well and maintaining his weight.  He is also drinking boost 1 to 2 cans/day.  5. Hypertension: -Continue metoprolol 1 tablet in the morning and half tablet in the evening.   Orders placed this encounter:  Orders Placed This Encounter  Procedures  . CT Chest W Contrast     Derek Jack, MD [place] [phone]   I, Milinda Antis, am acting as a scribe for Dr.  Sanda Linger.  I, Derek Jack MD, have reviewed the above documentation for accuracy and completeness, and I agree with the above.

## 2020-04-02 NOTE — Progress Notes (Signed)
Pt here for imfinzi today.  Labs WNL for treatment today.   Tolerated treatment well today without incidence.  Vital signs stable prior to discharge.  Discharged ambulatory in stable condition.

## 2020-04-02 NOTE — Patient Instructions (Signed)
[facility] Discharge Instructions for Patients Receiving Chemotherapy  Today you received the following chemotherapy agents   To help prevent nausea and vomiting after your treatment, we encourage you to take your nausea medication   If you develop nausea and vomiting that is not controlled by your nausea medication, call the clinic.   BELOW ARE SYMPTOMS THAT SHOULD BE REPORTED IMMEDIATELY:  *FEVER GREATER THAN 100.5 F  *CHILLS WITH OR WITHOUT FEVER  NAUSEA AND VOMITING THAT IS NOT CONTROLLED WITH YOUR NAUSEA MEDICATION  *UNUSUAL SHORTNESS OF BREATH  *UNUSUAL BRUISING OR BLEEDING  TENDERNESS IN MOUTH AND THROAT WITH OR WITHOUT PRESENCE OF ULCERS  *URINARY PROBLEMS  *BOWEL PROBLEMS  UNUSUAL RASH Items with * indicate a potential emergency and should be followed up as soon as possible.  Feel free to call the clinic should you have any questions or concerns. The clinic phone number is [phone].  Please show the CHEMO ALERT CARD at check-in to the Emergency Department and triage nurse.   

## 2020-04-02 NOTE — Progress Notes (Signed)
Patient was assessed by Dr. Delton Coombes and labs have been reviewed.  Treatment today pending lab results. Primary RN and pharmacy aware.

## 2020-04-02 NOTE — Patient Instructions (Addendum)
[place] at [facility] Discharge Instructions  You were seen today by Dr. Delton Coombes. He went over your recent results. You received your treatment today; continue getting your treatment every 2 weeks. Apply a moisturizing lotion to your dry skin to prevent it from itching. You will be scheduled to have a CT scan of your chest before your next visit. Dr. Delton Coombes will see you back in 4 weeks for labs and follow up.   Thank you for choosing [place] at [facility] to provide your oncology and hematology care.  To afford each patient quality time with our provider, please arrive at least 15 minutes before your scheduled appointment time.   If you have a lab appointment with the [facility] please come in thru the Main Entrance and check in at the main information desk  You need to re-schedule your appointment should you arrive 10 or more minutes late.  We strive to give you quality time with our providers, and arriving late affects you and other patients whose appointments are after yours.  Also, if you no show three or more times for appointments you may be dismissed from the clinic at the providers discretion.     Again, thank you for choosing [facility].  Our hope is that these requests will decrease the amount of time that you wait before being seen by our physicians.       _____________________________________________________________  Should you have questions after your visit to [facility], please contact our office at [phone] between the hours of 8:00 a.m. and 4:30 p.m.  Voicemails left after 4:00 p.m. will not be returned until the following business day.  For prescription refill requests, have your pharmacy contact our office and allow 72 hours.    Cancer Center Support Programs:   > Cancer Support Group  2nd Tuesday of the month 1pm-2pm, Journey Room

## 2020-04-16 ENCOUNTER — Inpatient Hospital Stay (HOSPITAL_COMMUNITY): Payer: Medicare Other

## 2020-04-16 ENCOUNTER — Other Ambulatory Visit: Payer: Self-pay

## 2020-04-16 VITALS — BP 117/74 | HR 98 | Temp 97.1°F | Resp 17

## 2020-04-16 DIAGNOSIS — Z95828 Presence of other vascular implants and grafts: Secondary | ICD-10-CM

## 2020-04-16 DIAGNOSIS — C3492 Malignant neoplasm of unspecified part of left bronchus or lung: Secondary | ICD-10-CM

## 2020-04-16 DIAGNOSIS — Z5112 Encounter for antineoplastic immunotherapy: Secondary | ICD-10-CM | POA: Diagnosis not present

## 2020-04-16 LAB — CBC WITH DIFFERENTIAL/PLATELET
Abs Immature Granulocytes: 0.02 10*3/uL (ref 0.00–0.07)
Basophils Absolute: 0.1 10*3/uL (ref 0.0–0.1)
Basophils Relative: 1 %
Eosinophils Absolute: 0.3 10*3/uL (ref 0.0–0.5)
Eosinophils Relative: 5 %
HCT: 37.8 % — ABNORMAL LOW (ref 39.0–52.0)
Hemoglobin: 11.9 g/dL — ABNORMAL LOW (ref 13.0–17.0)
Immature Granulocytes: 0 %
Lymphocytes Relative: 6 %
Lymphs Abs: 0.4 10*3/uL — ABNORMAL LOW (ref 0.7–4.0)
MCH: 30.4 pg (ref 26.0–34.0)
MCHC: 31.5 g/dL (ref 30.0–36.0)
MCV: 96.7 fL (ref 80.0–100.0)
Monocytes Absolute: 0.6 10*3/uL (ref 0.1–1.0)
Monocytes Relative: 9 %
Neutro Abs: 5.1 10*3/uL (ref 1.7–7.7)
Neutrophils Relative %: 79 %
Platelets: 135 10*3/uL — ABNORMAL LOW (ref 150–400)
RBC: 3.91 MIL/uL — ABNORMAL LOW (ref 4.22–5.81)
RDW: 13.4 % (ref 11.5–15.5)
WBC: 6.4 10*3/uL (ref 4.0–10.5)
nRBC: 0 % (ref 0.0–0.2)

## 2020-04-16 LAB — COMPREHENSIVE METABOLIC PANEL
ALT: 12 U/L (ref 0–44)
AST: 18 U/L (ref 15–41)
Albumin: 3.3 g/dL — ABNORMAL LOW (ref 3.5–5.0)
Alkaline Phosphatase: 65 U/L (ref 38–126)
Anion gap: 7 (ref 5–15)
BUN: 11 mg/dL (ref 8–23)
CO2: 27 mmol/L (ref 22–32)
Calcium: 9.1 mg/dL (ref 8.9–10.3)
Chloride: 101 mmol/L (ref 98–111)
Creatinine, Ser: 0.98 mg/dL (ref 0.61–1.24)
GFR, Estimated: >60 mL/min (ref >60)
Glucose, Bld: 125 mg/dL — ABNORMAL HIGH (ref 70–99)
Potassium: 3.7 mmol/L (ref 3.5–5.1)
Sodium: 135 mmol/L (ref 135–145)
Total Bilirubin: 0.5 mg/dL (ref 0.3–1.2)
Total Protein: 6.7 g/dL (ref 6.5–8.1)

## 2020-04-16 LAB — TSH: TSH: 0.03 u[IU]/mL — ABNORMAL LOW (ref 0.350–4.500)

## 2020-04-16 MED ORDER — SODIUM CHLORIDE 0.9% FLUSH
10.0000 mL | INTRAVENOUS | Status: DC | PRN
  Administered 2020-04-16: 10 mL

## 2020-04-16 MED ORDER — SODIUM CHLORIDE 0.9 % IV SOLN: Freq: Once | INTRAVENOUS | Status: AC

## 2020-04-16 MED ORDER — SODIUM CHLORIDE 0.9 % IV SOLN
10.0000 mg/kg | Freq: Once | INTRAVENOUS | Status: AC
  Administered 2020-04-16: 740 mg via INTRAVENOUS
  Filled 2020-04-16: qty 10

## 2020-04-16 MED ORDER — HEPARIN SOD (PORK) LOCK FLUSH 100 UNIT/ML IV SOLN
500.0000 [IU] | Freq: Once | INTRAVENOUS | Status: AC | PRN
  Administered 2020-04-16: 500 [IU]

## 2020-04-16 NOTE — Patient Instructions (Signed)
[facility] Discharge Instructions for Patients Receiving Chemotherapy  Today you received the following chemotherapy agents   To help prevent nausea and vomiting after your treatment, we encourage you to take your nausea medication   If you develop nausea and vomiting that is not controlled by your nausea medication, call the clinic.   BELOW ARE SYMPTOMS THAT SHOULD BE REPORTED IMMEDIATELY:  *FEVER GREATER THAN 100.5 F  *CHILLS WITH OR WITHOUT FEVER  NAUSEA AND VOMITING THAT IS NOT CONTROLLED WITH YOUR NAUSEA MEDICATION  *UNUSUAL SHORTNESS OF BREATH  *UNUSUAL BRUISING OR BLEEDING  TENDERNESS IN MOUTH AND THROAT WITH OR WITHOUT PRESENCE OF ULCERS  *URINARY PROBLEMS  *BOWEL PROBLEMS  UNUSUAL RASH Items with * indicate a potential emergency and should be followed up as soon as possible.  Feel free to call the clinic should you have any questions or concerns. The clinic phone number is [phone].  Please show the CHEMO ALERT CARD at check-in to the Emergency Department and triage nurse.   

## 2020-04-16 NOTE — Progress Notes (Signed)
Patient presents today for treatment. Vital signs are stable. Patient denies any changes since his last visit. Labs within parameters for treatment today.   Treatment given today per MD orders. Tolerated infusion without adverse affects. Vital signs stable. No complaints at this time. Discharged from clinic ambulatory in stable condition. Alert and oriented x 3. F/U with [facility] as scheduled.

## 2020-04-18 ENCOUNTER — Other Ambulatory Visit (HOSPITAL_COMMUNITY): Payer: Self-pay

## 2020-04-18 DIAGNOSIS — C3492 Malignant neoplasm of unspecified part of left bronchus or lung: Secondary | ICD-10-CM

## 2020-04-20 ENCOUNTER — Other Ambulatory Visit (HOSPITAL_COMMUNITY): Payer: Self-pay | Admitting: Hematology

## 2020-04-20 DIAGNOSIS — C3492 Malignant neoplasm of unspecified part of left bronchus or lung: Secondary | ICD-10-CM

## 2020-04-23 ENCOUNTER — Other Ambulatory Visit (HOSPITAL_COMMUNITY): Payer: Self-pay

## 2020-04-23 DIAGNOSIS — C3492 Malignant neoplasm of unspecified part of left bronchus or lung: Secondary | ICD-10-CM

## 2020-04-27 ENCOUNTER — Other Ambulatory Visit: Payer: Self-pay

## 2020-04-27 ENCOUNTER — Ambulatory Visit (HOSPITAL_COMMUNITY)
Admission: RE | Admit: 2020-04-27 | Discharge: 2020-04-27 | Disposition: A | Discharge status: Home / Self Care | Payer: Medicare Other | Source: Ambulatory Visit | Attending: Hematology | Admitting: Hematology

## 2020-04-27 DIAGNOSIS — C3492 Malignant neoplasm of unspecified part of left bronchus or lung: Secondary | ICD-10-CM | POA: Insufficient documentation

## 2020-04-27 MED ORDER — SODIUM CHLORIDE FLUSH 0.9 % IV SOLN
INTRAVENOUS | Status: AC
  Administered 2020-04-27: 10 mL via INTRAVENOUS
  Filled 2020-04-27: qty 10

## 2020-04-27 MED ORDER — HEPARIN SOD (PORK) LOCK FLUSH 100 UNIT/ML IV SOLN
INTRAVENOUS | Status: AC
  Administered 2020-04-27: 500 [IU]
  Filled 2020-04-27: qty 5

## 2020-04-27 MED ORDER — IOHEXOL 300 MG/ML  SOLN
75.0000 mL | Freq: Once | INTRAMUSCULAR | Status: AC | PRN
  Administered 2020-04-27: 75 mL via INTRAVENOUS

## 2020-04-30 ENCOUNTER — Encounter (HOSPITAL_COMMUNITY): Payer: Self-pay | Admitting: Hematology

## 2020-04-30 ENCOUNTER — Other Ambulatory Visit (HOSPITAL_COMMUNITY): Payer: Self-pay

## 2020-04-30 ENCOUNTER — Inpatient Hospital Stay (HOSPITAL_COMMUNITY): Payer: Medicare Other | Attending: Hematology

## 2020-04-30 ENCOUNTER — Inpatient Hospital Stay (HOSPITAL_COMMUNITY): Payer: Medicare Other

## 2020-04-30 ENCOUNTER — Other Ambulatory Visit: Payer: Self-pay

## 2020-04-30 ENCOUNTER — Inpatient Hospital Stay (HOSPITAL_BASED_OUTPATIENT_CLINIC_OR_DEPARTMENT_OTHER): Payer: Medicare Other | Admitting: Hematology

## 2020-04-30 VITALS — BP 98/55 | HR 97 | Temp 98.6°F | Resp 18

## 2020-04-30 VITALS — BP 100/60 | HR 95 | Temp 98.4°F | Resp 18 | Wt 157.1 lb

## 2020-04-30 DIAGNOSIS — Z7901 Long term (current) use of anticoagulants: Secondary | ICD-10-CM | POA: Diagnosis not present

## 2020-04-30 DIAGNOSIS — Z95828 Presence of other vascular implants and grafts: Secondary | ICD-10-CM

## 2020-04-30 DIAGNOSIS — C3492 Malignant neoplasm of unspecified part of left bronchus or lung: Secondary | ICD-10-CM

## 2020-04-30 DIAGNOSIS — E079 Disorder of thyroid, unspecified: Secondary | ICD-10-CM | POA: Diagnosis not present

## 2020-04-30 DIAGNOSIS — Z79899 Other long term (current) drug therapy: Secondary | ICD-10-CM | POA: Insufficient documentation

## 2020-04-30 DIAGNOSIS — I480 Paroxysmal atrial fibrillation: Secondary | ICD-10-CM | POA: Diagnosis not present

## 2020-04-30 DIAGNOSIS — J90 Pleural effusion, not elsewhere classified: Secondary | ICD-10-CM | POA: Insufficient documentation

## 2020-04-30 DIAGNOSIS — C3412 Malignant neoplasm of upper lobe, left bronchus or lung: Secondary | ICD-10-CM | POA: Diagnosis present

## 2020-04-30 DIAGNOSIS — I10 Essential (primary) hypertension: Secondary | ICD-10-CM | POA: Insufficient documentation

## 2020-04-30 DIAGNOSIS — Z5112 Encounter for antineoplastic immunotherapy: Secondary | ICD-10-CM | POA: Diagnosis present

## 2020-04-30 LAB — COMPREHENSIVE METABOLIC PANEL
ALT: 13 U/L (ref 0–44)
AST: 18 U/L (ref 15–41)
Albumin: 3.3 g/dL — ABNORMAL LOW (ref 3.5–5.0)
Alkaline Phosphatase: 64 U/L (ref 38–126)
Anion gap: 7 (ref 5–15)
BUN: 17 mg/dL (ref 8–23)
CO2: 26 mmol/L (ref 22–32)
Calcium: 8.9 mg/dL (ref 8.9–10.3)
Chloride: 101 mmol/L (ref 98–111)
Creatinine, Ser: 0.97 mg/dL (ref 0.61–1.24)
GFR, Estimated: >60 mL/min (ref >60)
Glucose, Bld: 104 mg/dL — ABNORMAL HIGH (ref 70–99)
Potassium: 3.6 mmol/L (ref 3.5–5.1)
Sodium: 134 mmol/L — ABNORMAL LOW (ref 135–145)
Total Bilirubin: 0.5 mg/dL (ref 0.3–1.2)
Total Protein: 7.2 g/dL (ref 6.5–8.1)

## 2020-04-30 LAB — CBC WITH DIFFERENTIAL/PLATELET
Abs Immature Granulocytes: 0.03 10*3/uL (ref 0.00–0.07)
Basophils Absolute: 0 10*3/uL (ref 0.0–0.1)
Basophils Relative: 0 %
Eosinophils Absolute: 0.4 10*3/uL (ref 0.0–0.5)
Eosinophils Relative: 6 %
HCT: 37.2 % — ABNORMAL LOW (ref 39.0–52.0)
Hemoglobin: 11.9 g/dL — ABNORMAL LOW (ref 13.0–17.0)
Immature Granulocytes: 0 %
Lymphocytes Relative: 7 %
Lymphs Abs: 0.5 10*3/uL — ABNORMAL LOW (ref 0.7–4.0)
MCH: 30 pg (ref 26.0–34.0)
MCHC: 32 g/dL (ref 30.0–36.0)
MCV: 93.7 fL (ref 80.0–100.0)
Monocytes Absolute: 0.6 10*3/uL (ref 0.1–1.0)
Monocytes Relative: 8 %
Neutro Abs: 5.9 10*3/uL (ref 1.7–7.7)
Neutrophils Relative %: 79 %
Platelets: 133 10*3/uL — ABNORMAL LOW (ref 150–400)
RBC: 3.97 MIL/uL — ABNORMAL LOW (ref 4.22–5.81)
RDW: 13.5 % (ref 11.5–15.5)
WBC: 7.6 10*3/uL (ref 4.0–10.5)
nRBC: 0 % (ref 0.0–0.2)

## 2020-04-30 LAB — TSH: TSH: 0.203 u[IU]/mL — ABNORMAL LOW (ref 0.350–4.500)

## 2020-04-30 LAB — T4, FREE: Free T4: 1.1 ng/dL (ref 0.61–1.12)

## 2020-04-30 LAB — CORTISOL: Cortisol, Plasma: 21 ug/dL

## 2020-04-30 MED ORDER — SODIUM CHLORIDE 0.9 % IV SOLN: Freq: Once | INTRAVENOUS | Status: AC

## 2020-04-30 MED ORDER — SODIUM CHLORIDE 0.9 % IV SOLN
10.0000 mg/kg | Freq: Once | INTRAVENOUS | Status: AC
  Administered 2020-04-30: 740 mg via INTRAVENOUS
  Filled 2020-04-30: qty 10

## 2020-04-30 MED ORDER — SODIUM CHLORIDE 0.9% FLUSH
10.0000 mL | INTRAVENOUS | Status: DC | PRN
  Administered 2020-04-30: 10 mL

## 2020-04-30 MED ORDER — HEPARIN SOD (PORK) LOCK FLUSH 100 UNIT/ML IV SOLN
500.0000 [IU] | Freq: Once | INTRAVENOUS | Status: AC | PRN
  Administered 2020-04-30: 500 [IU]

## 2020-04-30 MED ORDER — LANREOTIDE ACETATE 120 MG/0.5ML ~~LOC~~ SOLN
SUBCUTANEOUS | Status: AC
  Filled 2020-04-30: qty 120

## 2020-04-30 NOTE — Patient Instructions (Signed)
[place] at [facility] Discharge Instructions  You were seen today by Dr. Delton Coombes. He went over your recent results and scans. You received your treatment today. Eat protein-dense meals and drink 2-3 cans of Boost/Ensure daily to maintain your weight and energy levels. Dr. Delton Coombes will see you back in 2 weeks for labs and follow up.   Thank you for choosing [place] at [facility] to provide your oncology and hematology care.  To afford each patient quality time with our provider, please arrive at least 15 minutes before your scheduled appointment time.   If you have a lab appointment with the [facility] please come in thru the Main Entrance and check in at the main information desk  You need to re-schedule your appointment should you arrive 10 or more minutes late.  We strive to give you quality time with our providers, and arriving late affects you and other patients whose appointments are after yours.  Also, if you no show three or more times for appointments you may be dismissed from the clinic at the providers discretion.     Again, thank you for choosing [facility].  Our hope is that these requests will decrease the amount of time that you wait before being seen by our physicians.       _____________________________________________________________  Should you have questions after your visit to [facility], please contact our office at [phone] between the hours of 8:00 a.m. and 4:30 p.m.  Voicemails left after 4:00 p.m. will not be returned until the following business day.  For prescription refill requests, have your pharmacy contact our office and allow 72 hours.    Cancer Center Support Programs:   > Cancer Support Group  2nd Tuesday of the month 1pm-2pm, Journey Room

## 2020-04-30 NOTE — Progress Notes (Signed)
Patient presents today for Imfinzi infusion.  Vital signs within parameters for treatment.  Labs pending.  Patient states that he feels good and has no new complaints at this time.  Labs within parameters for treatment.  Message received from [place] patient okay for treatment.  Imfinzi infusion given today per MD orders.  Stable during infusion without adverse affects.  Vital signs stable.  No complaints at this time.  Discharge from clinic ambulatory in stable condition.  Alert and oriented X 3.  Follow up with [facility] as scheduled.

## 2020-04-30 NOTE — Addendum Note (Signed)
Addended by: Fayne Norrie on: 04/30/2020 01:58 PM   Modules accepted: Orders

## 2020-04-30 NOTE — Patient Instructions (Signed)
Durvalumab infusion What is this medicine? DURVALUMAB (dur VAL ue mab) is a monoclonal antibody. It is used to treat lung cancer. This medicine may be used for other purposes; ask your health care provider or pharmacist if you have questions. COMMON BRAND NAME(S): IMFINZI What should I tell my health care provider before I take this medicine? They need to know if you have any of these conditions:  autoimmune diseases like Crohn's disease, ulcerative colitis, or lupus  have had or planning to have an allogeneic stem cell transplant (uses someone else's stem cells)  history of organ transplant  history of radiation to the chest  nervous system problems like myasthenia gravis or Guillain-Barre syndrome  an unusual or allergic reaction to durvalumab, other medicines, foods, dyes, or preservatives  pregnant or trying to get pregnant  breast-feeding How should I use this medicine? This medicine is for infusion into a vein. It is given by a health care professional in a hospital or clinic setting. A special MedGuide will be given to you before each treatment. Be sure to read this information carefully each time. Talk to your pediatrician regarding the use of this medicine in children. Special care may be needed. Overdosage: If you think you have taken too much of this medicine contact a poison control center or emergency room at once. NOTE: This medicine is only for you. Do not share this medicine with others. What if I miss a dose? It is important not to miss your dose. Call your doctor or health care professional if you are unable to keep an appointment. What may interact with this medicine? Interactions have not been studied. This list may not describe all possible interactions. Give your health care provider a list of all the medicines, herbs, non-prescription drugs, or dietary supplements you use. Also tell them if you smoke, drink alcohol, or use illegal drugs. Some items may interact  with your medicine. What should I watch for while using this medicine? This drug may make you feel generally unwell. Continue your course of treatment even though you feel ill unless your doctor tells you to stop. You may need blood work done while you are taking this medicine. Do not become pregnant while taking this medicine or for 3 months after stopping it. Women should inform their doctor if they wish to become pregnant or think they might be pregnant. There is a potential for serious side effects to an unborn child. Talk to your health care professional or pharmacist for more information. Do not breast-feed an infant while taking this medicine or for 3 months after stopping it. What side effects may I notice from receiving this medicine? Side effects that you should report to your doctor or health care professional as soon as possible:  allergic reactions like skin rash, itching or hives, swelling of the face, lips, or tongue  black, tarry stools  bloody or watery diarrhea  breathing problems  change in emotions or moods  change in sex drive  changes in vision  chest pain or chest tightness  chills  confusion  cough  facial flushing  fever  headache  signs and symptoms of high blood sugar such as dizziness; dry mouth; dry skin; fruity breath; nausea; stomach pain; increased hunger or thirst; increased urination  signs and symptoms of liver injury like dark yellow or Fischler urine; general ill feeling or flu-like symptoms; light-colored stools; loss of appetite; nausea; right upper belly pain; unusually weak or tired; yellowing of the eyes or skin  stomach pain  trouble passing urine or change in the amount of urine  weight gain or weight loss Side effects that usually do not require medical attention (report these to your doctor or health care professional if they continue or are bothersome):  bone pain  constipation  loss of appetite  muscle  pain  nausea  swelling of the ankles, feet, hands  tiredness This list may not describe all possible side effects. Call your doctor for medical advice about side effects. You may report side effects to FDA at 1-800-FDA-1088. Where should I keep my medicine? This drug is given in a hospital or clinic and will not be stored at home. NOTE: This sheet is a summary. It may not cover all possible information. If you have questions about this medicine, talk to your doctor, pharmacist, or health care provider.  2021 [employer]/Gold Standard (2019-04-14 13:01:29)

## 2020-04-30 NOTE — Progress Notes (Signed)
Patient was assessed by Dr. Katragadda and labs have been reviewed.  Patient is okay to proceed with treatment today. Primary RN and pharmacy aware.   

## 2020-04-30 NOTE — Progress Notes (Signed)
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  PRIOR THERAPY: Concurrent chemoradiation with weekly carboplatin and paclitaxel x 6 cycles from 11/28/2019 to 01/02/2020  NGS Results: Not done  CURRENT THERAPY: Durvalumab every 2 weeks  BRIEF ONCOLOGIC HISTORY:  Oncology History  Squamous cell lung cancer, left ([place])  11/08/2019 Initial Diagnosis   Squamous cell lung cancer, left ([place])   11/08/2019 Cancer Staging   Staging form: Lung, AJCC 8th Edition - Clinical stage from 11/08/2019: Stage IIIB (cT4, cN2, cM0) - Signed by Derek Jack, MD on 11/08/2019   11/28/2019 - 01/02/2020 Chemotherapy   The patient had palonosetron (ALOXI) injection 0.25 mg, 0.25 mg, Intravenous,  Once, 6 of 6 cycles Administration: 0.25 mg (11/28/2019), 0.25 mg (12/05/2019), 0.25 mg (12/12/2019), 0.25 mg (12/19/2019), 0.25 mg (12/26/2019), 0.25 mg (01/02/2020) CARBOplatin (PARAPLATIN) 180 mg in sodium chloride 0.9 % 250 mL chemo infusion, 180 mg (100 % of original dose 176 mg), Intravenous,  Once, 6 of 6 cycles Dose modification:   (original dose 176 mg, Cycle 1),   (original dose 201.2 mg, Cycle 2),   (original dose 179.2 mg, Cycle 3),   (original dose 179.2 mg, Cycle 4),   (original dose 195.4 mg, Cycle 5),   (original dose 178.2 mg, Cycle 6) Administration: 180 mg (11/28/2019), 200 mg (12/05/2019), 180 mg (12/12/2019), 180 mg (12/19/2019), 190 mg (12/26/2019), 180 mg (01/02/2020) PACLitaxel (TAXOL) 84 mg in sodium chloride 0.9 % 250 mL chemo infusion (</= 80mg /m2), 45 mg/m2 = 84 mg, Intravenous,  Once, 6 of 6 cycles Administration: 84 mg (11/28/2019), 84 mg (12/05/2019), 84 mg (12/12/2019), 84 mg (12/19/2019), 84 mg (12/26/2019), 84 mg (01/02/2020)  for chemotherapy treatment.    01/30/2020 -  Chemotherapy    Patient is on Treatment Plan:  LUNG DURVALUMAB Q14D        CANCER STAGING: Cancer Staging Squamous cell lung cancer, left ([place]) Staging form: Lung, AJCC 8th Edition - Clinical stage from 11/08/2019: Stage IIIB (cT4, cN2, cM0) - Signed by Derek Jack, MD on 11/08/2019   INTERVAL HISTORY:  Mr. TRISHA MORANDI, a 70 y.o. male, returns for routine follow-up and consideration for next cycle of immunotherapy. Keshan was last seen on 04/02/2020.  Due for cycle #6 of durvalumab today.   Today he is accompanied by his wife. Overall, he tells me he has been feeling okay. He notes that he developed a cold about 2 weeks ago. He continues taking Hydromet BID and he continues getting coughing spells with white sputum. He denies having any issues with swallowing. His SOB with activity is stable. He is drinking Boost as needed, generally 2 cans daily when his wife can convince him to drink it.  Overall, he feels ready for next cycle of immunotherapy today.    REVIEW OF SYSTEMS:  Review of Systems  Constitutional: Positive for appetite change (50%), fatigue (50%) and unexpected weight change (lost 5 lbs in 2 weeks).  HENT:   Negative for trouble swallowing.   Respiratory: Positive for cough (spells w/ white sputum) and shortness of breath (w/ exertion; stable).   Neurological: Positive for dizziness.  All other systems reviewed and are negative.   PAST MEDICAL/SURGICAL HISTORY:  Past Medical History:  Diagnosis Date  . Arthritis   . Hyperlipemia   . Hypertension   . Hyperthyroidism   .  Lung cancer ([place])   . Myocardial infarction ([place])   . Paroxysmal atrial fibrillation ([facility])   . Port-A-Cath in place 11/14/2019  . Tachycardia    Past Surgical History:  Procedure Laterality Date  . CARDIAC CATHETERIZATION    . PORTACATH PLACEMENT Right 11/01/2019   Procedure: INSERTION PORT-A-CATH;  Surgeon: Aviva Signs, MD;  Location: [facility];  Service: General;  Laterality: Right;    SOCIAL HISTORY:  Social History    Socioeconomic History  . Marital status: Married    Spouse name: Not on file  . Number of children: 1  . Years of education: Not on file  . Highest education level: Not on file  Occupational History  . Occupation: retired  . Occupation: Glass blower/designer: [employer]  Tobacco Use  . Smoking status: Former Smoker    Years: 45.00    Types: 28, Cigarettes    Quit date: 10/19/2014    Years since quitting: 5.5  . Smokeless tobacco: Never Used  Vaping Use  . Vaping Use: Never used  Substance and Sexual Activity  . Alcohol use: Never  . Drug use: No  . Sexual activity: Not on file  Other Topics Concern  . Not on file  Social History Narrative  . Not on file   Social Determinants of Health   Financial Resource Strain: Medium Risk  . Difficulty of Paying Living Expenses: Somewhat hard  Food Insecurity: No Food Insecurity  . Worried About Charity fundraiser in the Last Year: Never true  . Ran Out of Food in the Last Year: Never true  Transportation Needs: No Transportation Needs  . Lack of Transportation (Medical): No  . Lack of Transportation (Non-Medical): No  Physical Activity: Inactive  . Days of Exercise per Week: 0 days  . Minutes of Exercise per Session: 0 min  Stress: Stress Concern Present  . Feeling of Stress : Very much  Social Connections: Moderately Isolated  . Frequency of Communication with Friends and Family: More than three times a week  . Frequency of Social Gatherings with Friends and Family: More than three times a week  . Attends Religious Services: Never  . Active Member of Clubs or Organizations: No  . Attends Archivist Meetings: Never  . Marital Status: Married  Human resources officer Violence: Not At Risk  . Fear of Current or Ex-Partner: No  . Emotionally Abused: No  . Physically Abused: No  . Sexually Abused: No    FAMILY HISTORY:  Family History  Problem Relation Age of Onset  . Hypertension Father   . Dementia Father         died age 77  . Heart disease Father   . Heart failure Other     CURRENT MEDICATIONS:  Current Outpatient Medications  Medication Sig Dispense Refill  . acetaminophen (TYLENOL) 500 MG tablet Take 500 mg by mouth every 6 (six) hours as needed for moderate pain or headache.    . albuterol (PROVENTIL) (2.5 MG/3ML) 0.083% nebulizer solution Take 3 mLs (2.5 mg total) by nebulization every 6 (six) hours as needed for wheezing or shortness of breath. 75 mL 12  . ALPRAZolam (XANAX) 0.25 MG tablet TAKE 1 TABLET BY MOUTH THREE TIMES DAILY AS NEEDED FOR ANXIETY 30 tablet 0  . alum & mag hydroxide-simeth (MAALOX MAX) 161-096-04 MG/5ML suspension Please take 1 tablespoon by mouth four times daily with meals. 480 mL 2  . apixaban (ELIQUIS) 5 MG TABS tablet Take 1 tablet (5  mg total) by mouth 2 (two) times daily. 60 tablet 6  . HYDROMET 5-1.5 MG/5ML syrup take 15 mls BY MOUTH EVERY 8 HOURS AS NEEDED FOR cough 473 mL 0  . lidocaine-prilocaine (EMLA) cream Apply 1 application topically as needed.    . metoprolol tartrate (LOPRESSOR) 25 MG tablet Take one tablet(25 mg )  in the AM and take 1/2 Tablet (12.5mg ) in the PM 135 tablet 3   No current facility-administered medications for this visit.    ALLERGIES:  No Known Allergies  PHYSICAL EXAM:  Performance status (ECOG): 1 - Symptomatic but completely ambulatory  Vitals:   04/30/20 0932  BP: 100/60  Pulse: 95  Resp: 18  Temp: 98.4 F (36.9 C)  SpO2: 98%   Wt Readings from Last 3 Encounters:  04/30/20 157 lb 1.6 oz (71.3 kg)  04/16/20 162 lb 3.2 oz (73.6 kg)  04/02/20 168 lb 6.4 oz (76.4 kg)   Physical Exam Vitals reviewed.  Constitutional:      Appearance: Normal appearance.  Cardiovascular:     Rate and Rhythm: Normal rate and regular rhythm.     Pulses: Normal pulses.     Heart sounds: Normal heart sounds.  Pulmonary:     Effort: Pulmonary effort is normal.     Breath sounds: Normal breath sounds.  Chest:     Comments: Port-a-Cath  in R chest Musculoskeletal:     Right lower leg: No edema.     Left lower leg: No edema.  Neurological:     General: No focal deficit present.     Mental Status: He is alert and oriented to person, place, and time.  Psychiatric:        Mood and Affect: Mood normal.        Behavior: Behavior normal.     LABORATORY DATA:  I have reviewed the labs as listed.  CBC Latest Ref Rng & Units 04/30/2020 04/16/2020 04/02/2020  WBC 4.0 - 10.5 K/uL 7.6 6.4 7.2  Hemoglobin 13.0 - 17.0 g/dL 11.9(L) 11.9(L) 10.8(L)  Hematocrit 39.0 - 52.0 % 37.2(L) 37.8(L) 34.5(L)  Platelets 150 - 400 K/uL 133(L) 135(L) 140(L)   CMP Latest Ref Rng & Units 04/30/2020 04/16/2020 04/02/2020  Glucose 70 - 99 mg/dL 104(H) 125(H) 110(H)  BUN 8 - 23 mg/dL 17 11 16   Creatinine 0.61 - 1.24 mg/dL 0.97 0.98 0.93  Sodium 135 - 145 mmol/L 134(L) 135 136  Potassium 3.5 - 5.1 mmol/L 3.6 3.7 3.8  Chloride 98 - 111 mmol/L 101 101 104  CO2 22 - 32 mmol/L 26 27 25   Calcium 8.9 - 10.3 mg/dL 8.9 9.1 8.9  Total Protein 6.5 - 8.1 g/dL 7.2 6.7 6.7  Total Bilirubin 0.3 - 1.2 mg/dL 0.5 0.5 0.4  Alkaline Phos 38 - 126 U/L 64 65 58  AST 15 - 41 U/L 18 18 17   ALT 0 - 44 U/L 13 12 11     DIAGNOSTIC IMAGING:  I have independently reviewed the scans and discussed with the patient. CT Chest W Contrast  Result Date: 04/28/2020 CLINICAL DATA:  Non-small cell lung cancer, history of LEFT squamous cell lung cancer status post chemo and radiation by report. EXAM: CT CHEST WITH CONTRAST TECHNIQUE: Multidetector CT imaging of the chest was performed during intravenous contrast administration. CONTRAST:  32mL OMNIPAQUE IOHEXOL 300 MG/ML  SOLN COMPARISON:  December third of 2021 FINDINGS: Cardiovascular: A RIGHT-sided Port-A-Cath in place terminates in the proximal superior vena cava near the brachiocephalic junction unchanged with respect position compared  to previous imaging. Heart size mildly enlarged. Small pericardial effusion very slightly increased  compared to the previous study 8 mm thickness as compared to 5 mm thickness on the previous exam. Three-vessel coronary artery disease as before. Central pulmonary vasculature stable with engorgement of the main pulmonary artery in case mint of LEFT pulmonary vasculature. Mediastinum/Nodes: Diffuse esophageal thickening. Diminished size of subcarinal nodal tissue (image 65, series 2) approximately 6 mm as compared to 8 mm short axis. Soft tissue tracking along the LEFT main pulmonary artery and bronchovascular structures extending into the LEFT upper chest is similar centrally. Small lymph nodes scattered about the chest with similar appearance. None with pathologic enlargement. Enlarged LEFT thyroid lobe with heterogeneity and lesion measuring as much as 3.1 x 2.0 cm in the inferior LEFT thyroid lobe is similar to previous imaging. Lungs/Pleura: Interval development of a small LEFT-sided pleural effusion with further volume loss in the LEFT upper lobe. Nodularity in the LEFT lower lobe is improved. There is bandlike scarring in the LEFT lower lobe that is developed since the previous study with mild septal thickening also slightly increased with further volume loss about the LEFT infrahilar region as well. In the axial plane the area in the LEFT upper lobe of previous volume loss measuring 5.5 x 4.1 cm previously 5.5 x 3.8 cm is accentuated by further perihilar consolidation and volume loss. Scattered small nodules in the RIGHT chest, for instance, other tiny nodules scattered about the RIGHT chest, for instance 2 nodules adjacent to the RIGHT hilum on image 67 of series 2. Image 65 of series 4 measuring approximately 4 mm no RIGHT-sided effusion or consolidative changes. Small nodule in the RIGHT upper lobe on image 41 of series 2 approximately 4-5 mm. Small nodule in the medial RIGHT lung on image 62 of series 2 approximately 6 mm. Upper Abdomen: Incidental imaging of upper abdominal contents with no acute  process. Adrenal glands are normal. Incompletely imaged presumed cysts in the RIGHT kidney. Musculoskeletal: No acute musculoskeletal process. Spinal degenerative changes. Lucent area in the superior endplate of T1 on image 105 of series 6 measuring 10 x 5 mm, atypical for Schmorl's node. IMPRESSION: 1. Interval development of a small LEFT-sided pleural effusion with further volume loss about the LEFT hilum accentuating the appearance of previous area of soft tissue and volume loss. Findings based on pattern of consolidation favor post treatment changes involving the chest at this time. 2. There is bandlike scarring in the LEFT lower lobe that is developed since the previous study with further volume loss about the LEFT infrahilar region as well. Findings are likely related to post treatment changes. Nodularity and septal thickening overall appears improved with respect to nodularity and irregular septal thickening in the LEFT lower lobe. 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 3. Small bone lesion at T1 suspicious for bony metastasis. 4. Developing nodules in the RIGHT chest, nonspecific but suspicious given patient history for developing disease in the RIGHT chest particularly nodule in the medial RIGHT chest on image 62 of series 2. PET imaging may be helpful to evaluate this finding and the finding above. 5. Diffuse esophageal thickening, nonspecific. 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 Correlate with any symptoms. 6. Enlarged LEFT thyroid lobe with heterogeneity and lesion measuring as much as 3.1 x 2.0 cm in the inferior LEFT thyroid lobe is similar to previous imaging. Recommend thyroid US (ref: J Am Coll Radiol. 2015 Feb;12(2): 143-50). 7. Three-vessel coronary artery disease. 8. Small pericardial effusion very slightly increased  compared to the previous study. Attention on follow-up. 9. Aortic atherosclerosis. Aortic Atherosclerosis (ICD10-I70.0). Electronically Signed   By:  Zetta Bills M.D.   On: 04/28/2020 19:07     ASSESSMENT:  1. Left lung cancer: -Presentation to the ER on 10/06/2019 with cough and dizziness. -CT chest with contrast on 10/06/2019 showed large left suprahilar mass completely obstructing left upper lobe bronchus, measuring 7 x 5.3 x 5.8 cm. Mass extends into AP window and narrows the left mainstem bronchus. Small subpleural nodule in the left lower lobe measuring 4 mm indeterminate. AP window nodal mass measuring 3.4 cm. No supraclavicular adenopathy. Subcarinal enlarged lymph node measures 1.4 cm. -PET scan on 10/14/2019 shows large hypermetabolic left upper lobe mass surrounding the left hilum, extending into pleural surface not invading the chest wall. Direct invasion into the AP window and metastatic subcarinal lymph node, T4 N2 M0. 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 -10 pound weight loss in the last 6 months. Hoarseness for last 1 week. -MRI of the brain on 10/28/2019 with no evidence of metastasis. -Concurrent chemoradiation therapy started on 11/28/2019.Completed 01/02/20 ( chemo) 01/06/2020(radiation) -CT chest on 01/20/2020 showed significant interval decrease in consolidation of the left upper lobe, interval decrease in irregular nodularity and consolidation of the left lower lobe. Unchanged posttreatment appearance of soft tissue about the left hilum and AP window. No discretely enlarged mediastinal or hilar lymph nodes. Trace left pleural effusion, decreased compared to prior exam. -Consolidation durvalumab started on 01/30/2020.  2. Social/family history: -He smoked 1 pack/day for 45 years, quit 5 years ago. -No family history of malignancies.   PLAN:  1.Stage III (T4 N2 M0) squamous cell carcinoma of the left lung: -Reviewed CT chest with contrast from 03/30/2020 which showed an development of small left-sided pleural effusion with further volume loss.   Bandlike scarring in the left lower lobe since prior study likely posttreatment changes.  Small bone lesion at T1 is also stable from pretreatment scan.  There is 1 small nodule in the right chest which is new about 4 mm we will monitor.  Diffuse esophageal thickening, likely post radiation esophagitis.  Small pericardial effusion very slightly increased. -We have reviewed his labs which showed normal CMP although albumin is low at 3.3.  CBC was normal. -Will recommend proceeding with his treatment today.  Reevaluate in 2 weeks.  2.Cough: -Continue Hycodan twice daily as needed.  3. Atrial fibrillation: -Continue Eliquis 5 mg twice daily.  4. Nutrition: -He lost 5 pounds since last visit.  He is drinking 1 Ensure per day. -Recommend increasing to 2 ensures per day.  5. Hypertension: -Continue metoprolol 1 tablet in the morning and half tablet in the evening.  Blood pressure is 100/60.  6.  Thyroid problem: -His TSH has decreased to 0.03 and improved to 0.2 today.  Serum cortisol was 21. -We will check free T4 and anti thyroid peroxidase antibodies.   Orders placed this encounter:  Orders Placed This Encounter  Procedures  . Thyroid Peroxidase Antibodies (TPO) ([facility])     Derek Jack, MD [facility] [phone]   I, Milinda Antis, am acting as a scribe for Dr. Sanda Linger.  I, Derek Jack MD, have reviewed the above documentation for accuracy and completeness, and I agree with the above.

## 2020-05-01 LAB — THYROID PEROXIDASE ANTIBODY: Thyroperoxidase Ab SerPl-aCnc: 204 IU/mL — ABNORMAL HIGH (ref 0–34)

## 2020-05-01 LAB — FOLLICLE STIMULATING HORMONE: FSH: 12 m[IU]/mL (ref 1.5–12.4)

## 2020-05-01 LAB — ACTH: C206 ACTH: 17.1 pg/mL (ref 7.2–63.3)

## 2020-05-01 LAB — LUTEINIZING HORMONE: LH: 4.5 m[IU]/mL (ref 1.7–8.6)

## 2020-05-01 LAB — GROWTH HORMONE: Growth Hormone: 1.6 ng/mL (ref 0.0–10.0)

## 2020-05-01 LAB — PROLACTIN: Prolactin: 17.6 ng/mL — ABNORMAL HIGH (ref 4.0–15.2)

## 2020-05-04 ENCOUNTER — Other Ambulatory Visit: Payer: Self-pay

## 2020-05-04 ENCOUNTER — Encounter (HOSPITAL_COMMUNITY): Payer: Self-pay | Admitting: Emergency Medicine

## 2020-05-04 ENCOUNTER — Emergency Department (HOSPITAL_COMMUNITY)
Admission: EM | Admit: 2020-05-04 | Discharge: 2020-05-05 | Disposition: A | Discharge status: Home / Self Care | Payer: Medicare Other | Attending: Emergency Medicine | Admitting: Emergency Medicine

## 2020-05-04 DIAGNOSIS — Z87891 Personal history of nicotine dependence: Secondary | ICD-10-CM | POA: Diagnosis not present

## 2020-05-04 DIAGNOSIS — K5641 Fecal impaction: Secondary | ICD-10-CM | POA: Diagnosis not present

## 2020-05-04 DIAGNOSIS — I10 Essential (primary) hypertension: Secondary | ICD-10-CM | POA: Diagnosis not present

## 2020-05-04 DIAGNOSIS — I4901 Ventricular fibrillation: Secondary | ICD-10-CM | POA: Diagnosis not present

## 2020-05-04 DIAGNOSIS — Z79899 Other long term (current) drug therapy: Secondary | ICD-10-CM | POA: Diagnosis not present

## 2020-05-04 DIAGNOSIS — Z85118 Personal history of other malignant neoplasm of bronchus and lung: Secondary | ICD-10-CM | POA: Diagnosis not present

## 2020-05-04 DIAGNOSIS — Z7901 Long term (current) use of anticoagulants: Secondary | ICD-10-CM | POA: Diagnosis not present

## 2020-05-04 DIAGNOSIS — I4891 Unspecified atrial fibrillation: Secondary | ICD-10-CM

## 2020-05-04 DIAGNOSIS — K6289 Other specified diseases of anus and rectum: Secondary | ICD-10-CM | POA: Diagnosis present

## 2020-05-04 LAB — BASIC METABOLIC PANEL
Anion gap: 11 (ref 5–15)
BUN: 20 mg/dL (ref 8–23)
CO2: 22 mmol/L (ref 22–32)
Calcium: 9.1 mg/dL (ref 8.9–10.3)
Chloride: 99 mmol/L (ref 98–111)
Creatinine, Ser: 1.09 mg/dL (ref 0.61–1.24)
GFR, Estimated: >60 mL/min (ref >60)
Glucose, Bld: 165 mg/dL — ABNORMAL HIGH (ref 70–99)
Potassium: 4 mmol/L (ref 3.5–5.1)
Sodium: 132 mmol/L — ABNORMAL LOW (ref 135–145)

## 2020-05-04 LAB — CBC
HCT: 38 % — ABNORMAL LOW (ref 39.0–52.0)
Hemoglobin: 12.3 g/dL — ABNORMAL LOW (ref 13.0–17.0)
MCH: 30.3 pg (ref 26.0–34.0)
MCHC: 32.4 g/dL (ref 30.0–36.0)
MCV: 93.6 fL (ref 80.0–100.0)
Platelets: 182 10*3/uL (ref 150–400)
RBC: 4.06 MIL/uL — ABNORMAL LOW (ref 4.22–5.81)
RDW: 13.3 % (ref 11.5–15.5)
WBC: 14 10*3/uL — ABNORMAL HIGH (ref 4.0–10.5)
nRBC: 0 % (ref 0.0–0.2)

## 2020-05-04 MED ORDER — METOPROLOL TARTRATE 50 MG PO TABS
50.0000 mg | ORAL_TABLET | Freq: Once | ORAL | Status: AC
  Administered 2020-05-04: 50 mg via ORAL
  Filled 2020-05-04: qty 1

## 2020-05-04 MED ORDER — METOPROLOL TARTRATE 5 MG/5ML IV SOLN
5.0000 mg | Freq: Once | INTRAVENOUS | Status: AC
  Administered 2020-05-04: 5 mg via INTRAVENOUS
  Filled 2020-05-04: qty 5

## 2020-05-04 NOTE — Discharge Instructions (Signed)
Your heart rate has been elevated, we have given you medications to slow it down.  You will need to continue to take your medications at home including metoprolol, please take that medication twice a day and follow-up with your cardiologist on Monday  Continue to use a stool softener such as Colace, 100 mg twice a day but return to the emergency department for severe or worsening symptoms, this includes increasing abdominal pain rectal pain or palpitations difficulty breathing or chest pain.  If you become lightheaded dizzy or your symptoms are worsening at all return to the ER immediately

## 2020-05-04 NOTE — ED Provider Notes (Signed)
[facility] EMERGENCY DEPARTMENT Provider Note   CSN: [id number] Arrival date & time: 05/04/20  1936     History Chief Complaint  Patient presents with  . Tachycardia    Alan [name] is a 70 y.o. male.  HPI   This patient is a 70 year old male, he has a history of persistent atrial fibrillation according to the most recent cardiology notes, he is on Eliquis, he takes metoprolol.  He actually presents to the hospital with a complaint of constipation and rectal pain, he states that he has been trying to use an enema and has had a little bit of diarrhea but still feels like he has a significant and tremendous amount of rectal pain.  He states this is been going on for couple of days gradually getting worse, he does not have any coughing shortness of breath or chest pain.  He does not have any fevers or chills nausea or vomiting and has not had any swelling of his legs.  He has been taking chemotherapy for his lung cancer, he no longer gets infusions.  He has done well.  He reports no significant history of constipation  Past Medical History:  Diagnosis Date  . Arthritis   . Hyperlipemia   . Hypertension   . Hyperthyroidism   . Lung cancer ([place])   . Myocardial infarction ([place])   . Paroxysmal atrial fibrillation ([facility])   . Port-A-Cath in place 11/14/2019  . Tachycardia     Patient Active Problem List   Diagnosis Date Noted  . Lung cancer ([place])   . Port-A-Cath in place 11/14/2019  . 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 Malignant neoplasm of left lung ([place])   . Mass of upper lobe of left lung 10/19/2019  . Atrial fibrillation with rapid ventricular response ([place]) 12/25/2010  . Hyperlipidemia 12/25/2010  . Hyperglycemia 12/25/2010  . Noncompliance with medications 12/25/2010  . Tobacco abuse 12/25/2010    Past Surgical History:  Procedure Laterality Date  . CARDIAC CATHETERIZATION    . PORTACATH PLACEMENT Right 11/01/2019   Procedure: INSERTION PORT-A-CATH;   Surgeon: Aviva Signs, MD;  Location: [facility];  Service: General;  Laterality: Right;       Family History  Problem Relation Age of Onset  . Hypertension Father   . Dementia Father        died age 30  . Heart disease Father   . Heart failure Other     Social History   Tobacco Use  . Smoking status: Former Smoker    Years: 45.00    Types: 48, Cigarettes    Quit date: 10/19/2014    Years since quitting: 5.5  . Smokeless tobacco: Never Used  Vaping Use  . Vaping Use: Never used  Substance Use Topics  . Alcohol use: Never  . Drug use: No    Home Medications Prior to Admission medications   Medication Sig Start Date End Date Taking? Authorizing Provider  acetaminophen (TYLENOL) 500 MG tablet Take 500 mg by mouth every 6 (six) hours as needed for moderate pain or headache.    [provider]  albuterol (PROVENTIL) (2.5 MG/3ML) 0.083% nebulizer solution Take 3 mLs (2.5 mg total) by nebulization every 6 (six) hours as needed for wheezing or shortness of breath. 10/27/19   Orpah Greek, MD  ALPRAZolam Duanne Moron) 0.25 MG tablet TAKE 1 TABLET BY MOUTH THREE TIMES DAILY AS NEEDED FOR ANXIETY 01/10/20   Derek Jack, MD  alum & mag hydroxide-simeth (MAALOX  MAX) 458-099-83 MG/5ML suspension Please take 1 tablespoon by mouth four times daily with meals. 12/19/19   Derek Jack, MD  apixaban (ELIQUIS) 5 MG TABS tablet Take 1 tablet (5 mg total) by mouth 2 (two) times daily. 11/04/19   Arnoldo Lenis, MD  HYDROMET 5-1.5 MG/5ML syrup take 15 mls BY MOUTH EVERY 8 HOURS AS NEEDED FOR cough 04/20/20   Derek Jack, MD  lidocaine-prilocaine (EMLA) cream Apply 1 application topically as needed. 01/30/20   [provider]  metoprolol tartrate (LOPRESSOR) 25 MG tablet Take one tablet(25 mg )  in the AM and take 1/2 Tablet (12.5mg ) in the PM 12/30/19   Ahmed Prima, Fransisco Hertz, PA-C    Allergies    Patient has no known allergies.  Review of Systems    Review of Systems  All other systems reviewed and are negative.   Physical Exam Updated Vital Signs BP 108/83   Pulse (!) 169   Temp 98.1 F (36.7 C)   Resp 16   Ht 1.626 m (5\' 4" )   Wt 71.3 kg   SpO2 100%   BMI 26.98 kg/m   Physical Exam Vitals and nursing note reviewed.  Constitutional:      General: He is not in acute distress.    Appearance: He is well-developed.  HENT:     Head: Normocephalic and atraumatic.     Mouth/Throat:     Pharynx: No oropharyngeal exudate.  Eyes:     General: No scleral icterus.       Right eye: No discharge.        Left eye: No discharge.     Conjunctiva/sclera: Conjunctivae normal.     Pupils: Pupils are equal, round, and reactive to light.  Neck:     Thyroid: No thyromegaly.     Vascular: No JVD.  Cardiovascular:     Rate and Rhythm: Tachycardia present. Rhythm irregular.     Heart sounds: Normal heart sounds. No murmur heard. No friction rub. No gallop.      Comments: EKG shows that the patient is in atrial fibrillation with a rapid ventricular rate approximately 150 to 180 bpm, he has good pulses and no edema Pulmonary:     Effort: Pulmonary effort is normal. No respiratory distress.     Breath sounds: Normal breath sounds. No wheezing or rales.  Abdominal:     General: Bowel sounds are normal. There is no distension.     Palpations: Abdomen is soft. There is no mass.     Tenderness: There is no abdominal tenderness.     Comments: The abdomen is soft and nontender  Genitourinary:    Comments: Rectal exam shows a large fecal stool burden, very hard, no blood, no masses, no hemorrhoids no blood, no masses, no hemorrhoids, no fissures Musculoskeletal:        General: No tenderness. Normal range of motion.     Cervical back: Normal range of motion and neck supple.  Lymphadenopathy:     Cervical: No cervical adenopathy.  Skin:    General: Skin is warm and dry.     Findings: No erythema or rash.  Neurological:     Mental  Status: He is alert.     Coordination: Coordination normal.  Psychiatric:        Behavior: Behavior normal.     ED Results / Procedures / Treatments   Labs (all labs ordered are listed, but only abnormal results are displayed) Labs Reviewed  CBC - Abnormal; Notable for  the following components:      Result Value   WBC 14.0 (*)    RBC 4.06 (*)    Hemoglobin 12.3 (*)    HCT 38.0 (*)    All other components within normal limits  BASIC METABOLIC PANEL - Abnormal; Notable for the following components:   Sodium 132 (*)    Glucose, Bld 165 (*)    All other components within normal limits    EKG EKG Interpretation  Date/Time:  Friday May 04 2020 20:01:40 EDT Ventricular Rate:  179 PR Interval:    QRS Duration: 82 QT Interval:  254 QTC Calculation: 438 R Axis:   95 Text Interpretation: Atrial fibrillation with rapid ventricular response Rightward axis ST & T wave abnormality, consider anterior ischemia Abnormal ECG Since last tracing rate faster Confirmed by Noemi Chapel [phone]) on 05/04/2020 8:31:15 PM   Radiology No results found.  Procedures Fecal disimpaction  Date/Time: 05/04/2020 8:29 PM Performed by: Noemi Chapel, MD Authorized by: Noemi Chapel, MD  Consent: Verbal consent obtained. Risks and benefits: risks, benefits and alternatives were discussed Consent given by: patient Patient understanding: patient states understanding of the procedure being performed Patient identity confirmed: verbally with patient Time out: Immediately prior to procedure a "time out" was called to verify the correct patient, procedure, equipment, support staff and site/side marked as required. Preparation: Patient was prepped and draped in the usual sterile fashion. Local anesthesia used: no  Anesthesia: Local anesthesia used: no  Sedation: Patient sedated: no  Patient tolerance: patient tolerated the procedure well with no immediate complications Comments: Large amount of hard  stool was passed successfully through the rectum with digital assistance      Medications Ordered in ED Medications  metoprolol tartrate (LOPRESSOR) injection 5 mg (has no administration in time range)  metoprolol tartrate (LOPRESSOR) tablet 50 mg (has no administration in time range)  metoprolol tartrate (LOPRESSOR) injection 5 mg (5 mg Intravenous Given 05/04/20 2050)    ED Course  I have reviewed the triage vital signs and the nursing notes.  Pertinent labs & imaging results that were available during my care of the patient were reviewed by me and considered in my medical decision making (see chart for details).  Clinical Course as of 05/04/20 2310  Fri May 04, 2020  2216 Tachycardia is significantly improved after single dose of Lopressor, he has now taken his home medications, he feels much better after being disimpacted and states he no longer has the rectal discomfort.  The patient is afebrile, labs are unremarkable except for a mild leukocytosis, he has no symptoms of infection anywhere, electrolytes are reassuring and unremarkable [BM]    Clinical Course User Index [BM] Noemi Chapel, MD   MDM Rules/Calculators/A&P                          This patient presents with 2 problems, #1 he is constipated and will need to have a fecal disimpaction, see procedure note.  The patient also has a problem of atrial fibrillation with rapid ventricular rate.  According to the notes he does have persistent atrial fibrillation.  He is on Eliquis and rate control but currently has a heart rate in the 170s but varies between 130 and 180 in the room.  Good pulses, good blood pressure, no fever.  We will give him a dose of IV rate control, he is not a candidate for cardioversion given his persistent A. fib status.  The patient  is agreeable  After disimpaction the patient appears much better, his heart rate is reduced  Heart rate is still fluctuating between 120 and 160, he has taken his home  dose of metoprolol, it is only 25 mg, I do not think that is good to be enough so I have given him additional IV and oral metoprolol.  At change of shift the patient has had a very large bowel movement and feels completely back to normal with regards to his gastrointestinal status.  His blood pressure is normal, heart rate is still elevated, he will be transitioned at change of shift to the oncoming provider Dr. Wyvonnia Dusky to follow-up results of the medication improvement and disposition accordingly.   Final Clinical Impression(s) / ED Diagnoses Final diagnoses:  Fecal impaction ([place])  Atrial fibrillation with RVR ([place])      Noemi Chapel, MD 05/04/20 2310

## 2020-05-04 NOTE — ED Notes (Signed)
Pt took home PO medication per MD verbal order.

## 2020-05-04 NOTE — ED Triage Notes (Signed)
Pt initially was c/o constipation. During triage, HR was 178. Pt denies chest pain, but endorses SOB

## 2020-05-05 ENCOUNTER — Other Ambulatory Visit: Payer: Self-pay

## 2020-05-05 DIAGNOSIS — K5641 Fecal impaction: Secondary | ICD-10-CM | POA: Diagnosis not present

## 2020-05-05 MED ORDER — HEPARIN SOD (PORK) LOCK FLUSH 100 UNIT/ML IV SOLN
500.0000 [IU] | Freq: Once | INTRAVENOUS | Status: AC
  Administered 2020-05-05: 500 [IU]
  Filled 2020-05-05: qty 5

## 2020-05-05 MED ORDER — SODIUM CHLORIDE 0.9 % IV BOLUS
1000.0000 mL | Freq: Once | INTRAVENOUS | Status: AC
  Administered 2020-05-05: 1000 mL via INTRAVENOUS

## 2020-05-05 NOTE — ED Provider Notes (Signed)
Care assumed from Dr. Sabra Heck.  Patient here with fecal impaction and rectal pain.  He was disimpacted with relief but remains tachycardic in atrial fibrillation.  He does have a history of this and is on Eliquis and metoprolol.  He was given p.o. and IV metoprolol in attempt to control his heart rate. He is asymptomatic from this.  No chest pain or shortness of breath.  No abdominal pain.  Patient given IV fluids.  He already received p.o. and IV metoprolol.  He denies chest pain or shortness of breath.  No abdominal pain.  No palpitations.  Heart rate has improved to the 90s to 100s. Patient feels well.  He appears stable for discharge with follow-up with his oncologist.  The family at bedside states they already have a plan at home for constipation as outlined by oncology. Return precautions discussed  BP 130/78   Pulse 98   Temp 98.1 F (36.7 C)   Resp (!) 22   Ht 5\' 4"  (1.626 m)   Wt 71.3 kg   SpO2 97%   BMI 26.98 kg/m     Ezequiel Essex, MD 05/05/20 [phone]

## 2020-05-07 ENCOUNTER — Other Ambulatory Visit: Payer: Self-pay

## 2020-05-07 ENCOUNTER — Other Ambulatory Visit (HOSPITAL_COMMUNITY)
Admission: RE | Admit: 2020-05-07 | Discharge: 2020-05-07 | Disposition: A | Discharge status: Home / Self Care | Payer: Medicare Other | Source: Ambulatory Visit | Attending: Student | Admitting: Student

## 2020-05-07 DIAGNOSIS — I2510 Atherosclerotic heart disease of native coronary artery without angina pectoris: Secondary | ICD-10-CM | POA: Insufficient documentation

## 2020-05-07 LAB — LIPID PANEL
Cholesterol: 98 mg/dL (ref 0–200)
HDL: 33 mg/dL — ABNORMAL LOW (ref >40)
LDL Cholesterol: 50 mg/dL (ref 0–99)
Total CHOL/HDL Ratio: 3 RATIO
Triglycerides: 77 mg/dL (ref <150)
VLDL: 15 mg/dL (ref 0–40)

## 2020-05-08 ENCOUNTER — Telehealth (HOSPITAL_COMMUNITY): Payer: Self-pay

## 2020-05-08 NOTE — Telephone Encounter (Signed)
This nurse spoke with patient related to after hours call placed over weekend with complaints of no bowel movement for a few days and increased pain and heart rate.  Patient states he is feeling much better today.  And has a follow up with Cardiology related to increase heart rate.  No further questions or concerns at this time.

## 2020-05-09 ENCOUNTER — Encounter: Payer: Self-pay | Admitting: Cardiology

## 2020-05-09 ENCOUNTER — Other Ambulatory Visit: Payer: Self-pay

## 2020-05-09 ENCOUNTER — Ambulatory Visit (INDEPENDENT_AMBULATORY_CARE_PROVIDER_SITE_OTHER): Payer: Medicare Other | Admitting: Cardiology

## 2020-05-09 ENCOUNTER — Telehealth: Payer: Self-pay

## 2020-05-09 VITALS — BP 132/70 | HR 93 | Ht 65.0 in | Wt 158.0 lb

## 2020-05-09 DIAGNOSIS — I4891 Unspecified atrial fibrillation: Secondary | ICD-10-CM | POA: Diagnosis not present

## 2020-05-09 NOTE — Telephone Encounter (Signed)
-----   Message from Erma Heritage, [place] sent at 05/08/2020  4:27 PM EDT ----- Please let the patient know his cholesterol is well controlled with total cholesterol at 98 and LDL at 50. He does not need medications for his cholesterol at this time.

## 2020-05-09 NOTE — Progress Notes (Signed)
Clinical Summary Alan [name] is a 70 y.o.male seen today for a focused visit on his history of PAF and recent ER visit with tachycardia.   1. PAF - in ER 04/2020 with fecal impaction and rectal pain. Issues with a fib with RVR at the time.  - 04/30/20 TSH 0.2 - av nodal agents have been limited by low bp's - multiple recent provider visits rates 80s to 90s   2. History of lung cancer - followed by oncology   Past Medical History:  Diagnosis Date  . Arthritis   . Hyperlipemia   . Hypertension   . Hyperthyroidism   . Lung cancer ([place])   . Myocardial infarction ([place])   . Paroxysmal atrial fibrillation ([facility])   . Port-A-Cath in place 11/14/2019  . Tachycardia      No Known Allergies   Current Outpatient Medications  Medication Sig Dispense Refill  . acetaminophen (TYLENOL) 500 MG tablet Take 500 mg by mouth every 6 (six) hours as needed for moderate pain or headache.    . albuterol (PROVENTIL) (2.5 MG/3ML) 0.083% nebulizer solution Take 3 mLs (2.5 mg total) by nebulization every 6 (six) hours as needed for wheezing or shortness of breath. 75 mL 12  . ALPRAZolam (XANAX) 0.25 MG tablet TAKE 1 TABLET BY MOUTH THREE TIMES DAILY AS NEEDED FOR ANXIETY 30 tablet 0  . alum & mag hydroxide-simeth (MAALOX MAX) [phone] MG/5ML suspension Please take 1 tablespoon by mouth four times daily with meals. 480 mL 2  . apixaban (ELIQUIS) 5 MG TABS tablet Take 1 tablet (5 mg total) by mouth 2 (two) times daily. 60 tablet 6  . HYDROMET 5-1.5 MG/5ML syrup take 15 mls BY MOUTH EVERY 8 HOURS AS NEEDED FOR cough 473 mL 0  . lidocaine-prilocaine (EMLA) cream Apply 1 application topically as needed.    . metoprolol tartrate (LOPRESSOR) 25 MG tablet Take one tablet(25 mg )  in the AM and take 1/2 Tablet (12.5mg ) in the PM 135 tablet 3   No current facility-administered medications for this visit.     Past Surgical History:  Procedure Laterality Date  . CARDIAC CATHETERIZATION    . PORTACATH  PLACEMENT Right 11/01/2019   Procedure: INSERTION PORT-A-CATH;  Surgeon: Alan Signs, MD;  Location: [facility];  Service: General;  Laterality: Right;     No Known Allergies    Family History  Problem Relation Age of Onset  . Hypertension Father   . Dementia Father        died age 64  . Heart disease Father   . Heart failure Other      Social History Alan [name] reports that he quit smoking about 5 years ago. His smoking use included cigars and cigarettes. He quit after 45.00 years of use. He has never used smokeless tobacco. Alan [name] reports no history of alcohol use.   Review of Systems CONSTITUTIONAL: No weight loss, fever, chills, weakness or fatigue.  HEENT: Eyes: No visual loss, blurred vision, double vision or yellow sclerae.No hearing loss, sneezing, congestion, runny nose or sore throat.  SKIN: No rash or itching.  CARDIOVASCULAR: per hpi RESPIRATORY: No shortness of breath, cough or sputum.  GASTROINTESTINAL: No anorexia, nausea, vomiting or diarrhea. No abdominal pain or blood.  GENITOURINARY: No burning on urination, no polyuria NEUROLOGICAL: No headache, dizziness, syncope, paralysis, ataxia, numbness or tingling in the extremities. No change in bowel or bladder control.  MUSCULOSKELETAL: No muscle, back pain, joint pain or stiffness.  LYMPHATICS: No  enlarged nodes. No history of splenectomy.  PSYCHIATRIC: No history of depression or anxiety.  ENDOCRINOLOGIC: No reports of sweating, cold or heat intolerance. No polyuria or polydipsia.  Marland Kitchen   Physical Examination Today's Vitals   05/09/20 1037  BP: 132/70  Pulse: 93  SpO2: 99%  Weight: 158 lb (71.7 kg)  Height: 5\' 5"  (1.651 m)   Body mass index is 26.29 kg/m.  Gen: resting comfortably, no acute distress HEENT: no scleral icterus, pupils equal round and reactive, no palptable cervical adenopathy,  CV: RRR, no m/r/g, no jvd Resp: Clear to auscultation bilaterally GI: abdomen is soft, non-tender,  non-distended, normal bowel sounds, no hepatosplenomegaly MSK: extremities are warm, no edema.  Skin: warm, no rash Neuro:  no focal deficits Psych: appropriate affect   Assessment and Plan  1. Afib - recent high rates during ER visit with fecal impaction and severe rectal pain - outpatient clinic rates have been 80s to 90s - av nodal agent dosing has been limited due to low bp's - would continue current medication regimen, recent RVR due to fecal impaction and pain, rates have gone back to his baseline - continue anticoag - EKG today shows afib high 90s      Arnoldo Lenis, M.D.

## 2020-05-09 NOTE — Telephone Encounter (Signed)
Patient was in office for OV with Dr. Harl Bowie and was told results while in office. Patient verbalized understanding and had no questions or concerns at this time.

## 2020-05-09 NOTE — Patient Instructions (Signed)

## 2020-05-14 ENCOUNTER — Other Ambulatory Visit: Payer: Self-pay

## 2020-05-14 ENCOUNTER — Ambulatory Visit (HOSPITAL_COMMUNITY): Payer: Medicare Other

## 2020-05-14 ENCOUNTER — Ambulatory Visit (HOSPITAL_COMMUNITY): Payer: Medicare Other | Admitting: Hematology

## 2020-05-14 ENCOUNTER — Inpatient Hospital Stay (HOSPITAL_COMMUNITY): Payer: Medicare Other

## 2020-05-14 ENCOUNTER — Other Ambulatory Visit (HOSPITAL_COMMUNITY): Payer: Self-pay

## 2020-05-14 ENCOUNTER — Other Ambulatory Visit (HOSPITAL_COMMUNITY): Payer: Self-pay | Admitting: Hematology

## 2020-05-14 ENCOUNTER — Other Ambulatory Visit (HOSPITAL_COMMUNITY): Payer: Medicare Other

## 2020-05-14 ENCOUNTER — Inpatient Hospital Stay (HOSPITAL_BASED_OUTPATIENT_CLINIC_OR_DEPARTMENT_OTHER): Payer: Medicare Other | Admitting: Hematology

## 2020-05-14 VITALS — BP 118/69 | HR 70 | Temp 97.0°F | Resp 18 | Wt 158.7 lb

## 2020-05-14 VITALS — BP 97/56 | HR 89 | Temp 97.0°F | Resp 18

## 2020-05-14 DIAGNOSIS — C3492 Malignant neoplasm of unspecified part of left bronchus or lung: Secondary | ICD-10-CM

## 2020-05-14 DIAGNOSIS — Z5112 Encounter for antineoplastic immunotherapy: Secondary | ICD-10-CM | POA: Diagnosis not present

## 2020-05-14 DIAGNOSIS — Z95828 Presence of other vascular implants and grafts: Secondary | ICD-10-CM

## 2020-05-14 LAB — COMPREHENSIVE METABOLIC PANEL
ALT: 20 U/L (ref 0–44)
AST: 26 U/L (ref 15–41)
Albumin: 3.4 g/dL — ABNORMAL LOW (ref 3.5–5.0)
Alkaline Phosphatase: 62 U/L (ref 38–126)
Anion gap: 9 (ref 5–15)
BUN: 22 mg/dL (ref 8–23)
CO2: 25 mmol/L (ref 22–32)
Calcium: 8.9 mg/dL (ref 8.9–10.3)
Chloride: 100 mmol/L (ref 98–111)
Creatinine, Ser: 1.05 mg/dL (ref 0.61–1.24)
GFR, Estimated: >60 mL/min (ref >60)
Glucose, Bld: 113 mg/dL — ABNORMAL HIGH (ref 70–99)
Potassium: 4.1 mmol/L (ref 3.5–5.1)
Sodium: 134 mmol/L — ABNORMAL LOW (ref 135–145)
Total Bilirubin: 0.3 mg/dL (ref 0.3–1.2)
Total Protein: 7.2 g/dL (ref 6.5–8.1)

## 2020-05-14 LAB — CBC WITH DIFFERENTIAL/PLATELET
Abs Immature Granulocytes: 0.02 10*3/uL (ref 0.00–0.07)
Basophils Absolute: 0.1 10*3/uL (ref 0.0–0.1)
Basophils Relative: 1 %
Eosinophils Absolute: 0.4 10*3/uL (ref 0.0–0.5)
Eosinophils Relative: 6 %
HCT: 37.9 % — ABNORMAL LOW (ref 39.0–52.0)
Hemoglobin: 12 g/dL — ABNORMAL LOW (ref 13.0–17.0)
Immature Granulocytes: 0 %
Lymphocytes Relative: 10 %
Lymphs Abs: 0.7 10*3/uL (ref 0.7–4.0)
MCH: 29.9 pg (ref 26.0–34.0)
MCHC: 31.7 g/dL (ref 30.0–36.0)
MCV: 94.3 fL (ref 80.0–100.0)
Monocytes Absolute: 0.6 10*3/uL (ref 0.1–1.0)
Monocytes Relative: 10 %
Neutro Abs: 4.8 10*3/uL (ref 1.7–7.7)
Neutrophils Relative %: 73 %
Platelets: 168 10*3/uL (ref 150–400)
RBC: 4.02 MIL/uL — ABNORMAL LOW (ref 4.22–5.81)
RDW: 13.9 % (ref 11.5–15.5)
WBC: 6.5 10*3/uL (ref 4.0–10.5)
nRBC: 0 % (ref 0.0–0.2)

## 2020-05-14 LAB — MAGNESIUM: Magnesium: 2 mg/dL (ref 1.7–2.4)

## 2020-05-14 MED ORDER — HYDROCODONE-HOMATROPINE 5-1.5 MG/5ML PO SYRP: ORAL_SOLUTION | ORAL | 0 refills | Status: DC

## 2020-05-14 MED ORDER — DRONABINOL 5 MG PO CAPS: 5.0000 mg | ORAL_CAPSULE | Freq: Two times a day (BID) | ORAL | 2 refills | Status: DC

## 2020-05-14 MED ORDER — SODIUM CHLORIDE 0.9 % IV SOLN: Freq: Once | INTRAVENOUS | Status: AC

## 2020-05-14 MED ORDER — SODIUM CHLORIDE 0.9 % IV SOLN
10.0000 mg/kg | Freq: Once | INTRAVENOUS | Status: AC
  Administered 2020-05-14: 740 mg via INTRAVENOUS
  Filled 2020-05-14: qty 10

## 2020-05-14 MED ORDER — SODIUM CHLORIDE 0.9% FLUSH
10.0000 mL | INTRAVENOUS | Status: DC | PRN
  Administered 2020-05-14: 10 mL

## 2020-05-14 MED ORDER — HEPARIN SOD (PORK) LOCK FLUSH 100 UNIT/ML IV SOLN
500.0000 [IU] | Freq: Once | INTRAVENOUS | Status: AC | PRN
  Administered 2020-05-14: 500 [IU]

## 2020-05-14 NOTE — Progress Notes (Signed)
Patient presents today for Imfinzi infusion today.  Vital signs and labs within parameters for treatment.  Patient has no new complaints at this time.  Imfinzi infusion given today per MD orders.  Stable during infusion without adverse affects.  Vital signs stable.  No complaints at this time.  Discharge from clinic ambulatory in stable condition.  Alert and oriented X 3.  Follow up with [facility] as scheduled.

## 2020-05-14 NOTE — Progress Notes (Signed)
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  PRIOR THERAPY: Concurrent chemoradiation with weekly carboplatin and paclitaxel x 6 cycles from 11/28/2019 to 01/02/2020  NGS Results: Not done  CURRENT THERAPY: Durvalumab every 2 weeks  BRIEF ONCOLOGIC HISTORY:  Oncology History  Squamous cell lung cancer, left ([facility])  11/08/2019 Initial Diagnosis   Squamous cell lung cancer, left ([facility])   11/08/2019 Cancer Staging   Staging form: Lung, AJCC 8th Edition - Clinical stage from 11/08/2019: Stage IIIB (cT4, cN2, cM0) - Signed by Derek Jack, MD on 11/08/2019   11/28/2019 - 01/02/2020 Chemotherapy   The patient had palonosetron (ALOXI) injection 0.25 mg, 0.25 mg, Intravenous,  Once, 6 of 6 cycles Administration: 0.25 mg (11/28/2019), 0.25 mg (12/05/2019), 0.25 mg (12/12/2019), 0.25 mg (12/19/2019), 0.25 mg (12/26/2019), 0.25 mg (01/02/2020) CARBOplatin (PARAPLATIN) 180 mg in sodium chloride 0.9 % 250 mL chemo infusion, 180 mg (100 % of original dose 176 mg), Intravenous,  Once, 6 of 6 cycles Dose modification:   (original dose 176 mg, Cycle 1),   (original dose 201.2 mg, Cycle 2),   (original dose 179.2 mg, Cycle 3),   (original dose 179.2 mg, Cycle 4),   (original dose 195.4 mg, Cycle 5),   (original dose 178.2 mg, Cycle 6) Administration: 180 mg (11/28/2019), 200 mg (12/05/2019), 180 mg (12/12/2019), 180 mg (12/19/2019), 190 mg (12/26/2019), 180 mg (01/02/2020) PACLitaxel (TAXOL) 84 mg in sodium chloride 0.9 % 250 mL chemo infusion (</= 80mg /m2), 45 mg/m2 = 84 mg, Intravenous,  Once, 6 of 6 cycles Administration: 84 mg (11/28/2019), 84 mg (12/05/2019), 84 mg (12/12/2019), 84 mg (12/19/2019), 84 mg (12/26/2019), 84 mg (01/02/2020)  for chemotherapy treatment.    01/30/2020  -  Chemotherapy    Patient is on Treatment Plan: LUNG DURVALUMAB Q14D        CANCER STAGING: Cancer Staging Squamous cell lung cancer, left ([facility]) Staging form: Lung, AJCC 8th Edition - Clinical stage from 11/08/2019: Stage IIIB (cT4, cN2, cM0) - Signed by Derek Jack, MD on 11/08/2019   INTERVAL HISTORY:  Alan [name], a 70 y.o. male, returns for routine follow-up and consideration for next cycle of immunotherapy. Alan [name] was last seen on 04/30/2020.  Due for cycle #7 of durvalumab today.   Today he is accompanied by his wife. Overall, he tells me he has been feeling okay. His appetite is still lower but his weight is steady. He denies having any palpitations or constipation recently. His cough and SOB with exertion are stable and he continues coughing up white sputum; he is taking Hycodan PRN. He denies having N/V/D. He is drinking 2-3 cans of Boost per day and eating what he wants to eat. He denies having any headaches. He continues taking Eliquis.  Overall, he feels ready for next cycle of immunotherapy today.    REVIEW OF SYSTEMS:  Review of Systems  Constitutional: Positive for appetite change (75%) and fatigue (50%). Negative for unexpected weight change.  Respiratory: Positive for cough (productive) and shortness of breath.   Cardiovascular: Negative for palpitations.  Gastrointestinal: Positive for constipation (occasional). Negative for diarrhea, nausea and vomiting.  Neurological: Negative for headaches.  All other systems reviewed and are negative.   PAST MEDICAL/SURGICAL HISTORY:  Past Medical History:  Diagnosis Date  . Arthritis   .  Hyperlipemia   . Hypertension   . Hyperthyroidism   . Lung cancer ([facility])   . Myocardial infarction ([facility])   . Paroxysmal atrial fibrillation ([facility])   . Port-A-Cath in place 11/14/2019  . Tachycardia    Past Surgical History:  Procedure Laterality Date  . CARDIAC CATHETERIZATION    . PORTACATH PLACEMENT Right  11/01/2019   Procedure: INSERTION PORT-A-CATH;  Surgeon: Aviva Signs, MD;  Location: [facility];  Service: General;  Laterality: Right;    SOCIAL HISTORY:  Social History   Socioeconomic History  . Marital status: Married    Spouse name: Not on file  . Number of children: 1  . Years of education: Not on file  . Highest education level: Not on file  Occupational History  . Occupation: retired  . Occupation: Glass blower/designer: [employer]  Tobacco Use  . Smoking status: Former Smoker    Years: 45.00    Types: 66, Cigarettes    Quit date: 10/19/2014    Years since quitting: 5.5  . Smokeless tobacco: Never Used  Vaping Use  . Vaping Use: Never used  Substance and Sexual Activity  . Alcohol use: Never  . Drug use: No  . Sexual activity: Not on file  Other Topics Concern  . Not on file  Social History Narrative  . Not on file   Social Determinants of Health   Financial Resource Strain: Medium Risk  . Difficulty of Paying Living Expenses: Somewhat hard  Food Insecurity: No Food Insecurity  . Worried About Charity fundraiser in the Last Year: Never true  . Ran Out of Food in the Last Year: Never true  Transportation Needs: No Transportation Needs  . Lack of Transportation (Medical): No  . Lack of Transportation (Non-Medical): No  Physical Activity: Inactive  . Days of Exercise per Week: 0 days  . Minutes of Exercise per Session: 0 min  Stress: Stress Concern Present  . Feeling of Stress : Very much  Social Connections: Moderately Isolated  . Frequency of Communication with Friends and Family: More than three times a week  . Frequency of Social Gatherings with Friends and Family: More than three times a week  . Attends Religious Services: Never  . Active Member of Clubs or Organizations: No  . Attends Archivist Meetings: Never  . Marital Status: Married  Human resources officer Violence: Not At Risk  . Fear of Current or Ex-Partner: No  . Emotionally Abused: No   . Physically Abused: No  . Sexually Abused: No    FAMILY HISTORY:  Family History  Problem Relation Age of Onset  . Hypertension Father   . Dementia Father        died age 38  . Heart disease Father   . Heart failure Other     CURRENT MEDICATIONS:  Current Outpatient Medications  Medication Sig Dispense Refill  . acetaminophen (TYLENOL) 500 MG tablet Take 500 mg by mouth every 6 (six) hours as needed for moderate pain or headache.    . albuterol (PROVENTIL) (2.5 MG/3ML) 0.083% nebulizer solution Take 3 mLs (2.5 mg total) by nebulization every 6 (six) hours as needed for wheezing or shortness of breath. 75 mL 12  . ALPRAZolam (XANAX) 0.25 MG tablet TAKE 1 TABLET BY MOUTH THREE TIMES DAILY AS NEEDED FOR ANXIETY 30 tablet 0  . apixaban (ELIQUIS) 5 MG TABS tablet Take 1 tablet (5 mg total) by mouth 2 (two) times daily. 60 tablet 6  .  HYDROMET 5-1.5 MG/5ML syrup take 15 mls BY MOUTH EVERY 8 HOURS AS NEEDED FOR cough 473 mL 0  . lidocaine-prilocaine (EMLA) cream Apply 1 application topically as needed.    . metoprolol tartrate (LOPRESSOR) 25 MG tablet Take one tablet(25 mg )  in the AM and take 1/2 Tablet (12.5mg ) in the PM 135 tablet 3  . dronabinol (MARINOL) 5 MG capsule Take 5 mg by mouth 2 (two) times daily before a meal.     No current facility-administered medications for this visit.    ALLERGIES:  No Known Allergies  PHYSICAL EXAM:  Performance status (ECOG): 1 - Symptomatic but completely ambulatory  Vitals:   05/14/20 0812  BP: 118/69  Pulse: 70  Resp: 18  Temp: (!) 97 F (36.1 C)  SpO2: 100%   Wt Readings from Last 3 Encounters:  05/14/20 158 lb 11.2 oz (72 kg)  05/09/20 158 lb (71.7 kg)  05/04/20 157 lb 3 oz (71.3 kg)   Physical Exam Vitals reviewed.  Constitutional:      Appearance: Normal appearance.  Cardiovascular:     Rate and Rhythm: Normal rate and regular rhythm.     Pulses: Normal pulses.     Heart sounds: Normal heart sounds.  Pulmonary:      Effort: Pulmonary effort is normal.     Breath sounds: Normal breath sounds.  Chest:     Comments: Port-a-Cath in R chest Abdominal:     Palpations: Abdomen is soft. There is no hepatomegaly, splenomegaly or mass.     Tenderness: There is no abdominal tenderness.     Hernia: No hernia is present.  Musculoskeletal:     Right lower leg: No edema.     Left lower leg: No edema.  Neurological:     General: No focal deficit present.     Mental Status: He is alert and oriented to person, place, and time.  Psychiatric:        Mood and Affect: Mood normal.        Behavior: Behavior normal.     LABORATORY DATA:  I have reviewed the labs as listed.  CBC Latest Ref Rng & Units 05/14/2020 05/04/2020 04/30/2020  WBC 4.0 - 10.5 K/uL 6.5 14.0(H) 7.6  Hemoglobin 13.0 - 17.0 g/dL 12.0(L) 12.3(L) 11.9(L)  Hematocrit 39.0 - 52.0 % 37.9(L) 38.0(L) 37.2(L)  Platelets 150 - 400 K/uL 168 182 133(L)   CMP Latest Ref Rng & Units 05/14/2020 05/04/2020 04/30/2020  Glucose 70 - 99 mg/dL 113(H) 165(H) 104(H)  BUN 8 - 23 mg/dL 22 20 17   Creatinine 0.61 - 1.24 mg/dL 1.05 1.09 0.97  Sodium 135 - 145 mmol/L 134(L) 132(L) 134(L)  Potassium 3.5 - 5.1 mmol/L 4.1 4.0 3.6  Chloride 98 - 111 mmol/L 100 99 101  CO2 22 - 32 mmol/L 25 22 26   Calcium 8.9 - 10.3 mg/dL 8.9 9.1 8.9  Total Protein 6.5 - 8.1 g/dL 7.2 - 7.2  Total Bilirubin 0.3 - 1.2 mg/dL 0.3 - 0.5  Alkaline Phos 38 - 126 U/L 62 - 64  AST 15 - 41 U/L 26 - 18  ALT 0 - 44 U/L 20 - 13    DIAGNOSTIC IMAGING:  I have independently reviewed the scans and discussed with the patient. CT Chest W Contrast  Result Date: 04/28/2020 CLINICAL DATA:  Non-small cell lung cancer, history of LEFT squamous cell lung cancer status post chemo and radiation by report. EXAM: CT CHEST WITH CONTRAST TECHNIQUE: Multidetector CT imaging of the chest was  performed during intravenous contrast administration. CONTRAST:  32mL OMNIPAQUE IOHEXOL 300 MG/ML  SOLN COMPARISON:  December third  of 2021 FINDINGS: Cardiovascular: A RIGHT-sided Port-A-Cath in place terminates in the proximal superior vena cava near the brachiocephalic junction unchanged with respect position compared to previous imaging. Heart size mildly enlarged. Small pericardial effusion very slightly increased compared to the previous study 8 mm thickness as compared to 5 mm thickness on the previous exam. Three-vessel coronary artery disease as before. Central pulmonary vasculature stable with engorgement of the main pulmonary artery in case mint of LEFT pulmonary vasculature. Mediastinum/Nodes: Diffuse esophageal thickening. Diminished size of subcarinal nodal tissue (image 65, series 2) approximately 6 mm as compared to 8 mm short axis. Soft tissue tracking along the LEFT main pulmonary artery and bronchovascular structures extending into the LEFT upper chest is similar centrally. Small lymph nodes scattered about the chest with similar appearance. None with pathologic enlargement. Enlarged LEFT thyroid lobe with heterogeneity and lesion measuring as much as 3.1 x 2.0 cm in the inferior LEFT thyroid lobe is similar to previous imaging. Lungs/Pleura: Interval development of a small LEFT-sided pleural effusion with further volume loss in the LEFT upper lobe. Nodularity in the LEFT lower lobe is improved. There is bandlike scarring in the LEFT lower lobe that is developed since the previous study with mild septal thickening also slightly increased with further volume loss about the LEFT infrahilar region as well. In the axial plane the area in the LEFT upper lobe of previous volume loss measuring 5.5 x 4.1 cm previously 5.5 x 3.8 cm is accentuated by further perihilar consolidation and volume loss. Scattered small nodules in the RIGHT chest, for instance, other tiny nodules scattered about the RIGHT chest, for instance 2 nodules adjacent to the RIGHT hilum on image 67 of series 2. Image 65 of series 4 measuring approximately 4 mm no  RIGHT-sided effusion or consolidative changes. Small nodule in the RIGHT upper lobe on image 41 of series 2 approximately 4-5 mm. Small nodule in the medial RIGHT lung on image 62 of series 2 approximately 6 mm. Upper Abdomen: Incidental imaging of upper abdominal contents with no acute process. Adrenal glands are normal. Incompletely imaged presumed cysts in the RIGHT kidney. Musculoskeletal: No acute musculoskeletal process. Spinal degenerative changes. Lucent area in the superior endplate of T1 on image 105 of series 6 measuring 10 x 5 mm, atypical for Schmorl's node. IMPRESSION: 1. Interval development of a small LEFT-sided pleural effusion with further volume loss about the LEFT hilum accentuating the appearance of previous area of soft tissue and volume loss. Findings based on pattern of consolidation favor post treatment changes involving the chest at this time. 2. There is bandlike scarring in the LEFT lower lobe that is developed since the previous study with further volume loss about the LEFT infrahilar region as well. Findings are likely related to post treatment changes. Nodularity and septal thickening overall appears improved with respect to nodularity and irregular septal thickening in the LEFT lower lobe. 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 3. Small bone lesion at T1 suspicious for bony metastasis. 4. Developing nodules in the RIGHT chest, nonspecific but suspicious given patient history for developing disease in the RIGHT chest particularly nodule in the medial RIGHT chest on image 62 of series 2. PET imaging may be helpful to evaluate this finding and the finding above. 5. Diffuse esophageal thickening, nonspecific. 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 Correlate with any symptoms. 6. Enlarged LEFT thyroid lobe with heterogeneity and  lesion measuring as much as 3.1 x 2.0 cm in the inferior LEFT thyroid lobe is similar to previous imaging. Recommend thyroid US (ref: J Am  Coll Radiol. 2015 Feb;12(2): 143-50). 7. Three-vessel coronary artery disease. 8. Small pericardial effusion very slightly increased compared to the previous study. Attention on follow-up. 9. Aortic atherosclerosis. Aortic Atherosclerosis (ICD10-I70.0). Electronically Signed   By: Zetta Bills M.D.   On: 04/28/2020 19:07     ASSESSMENT:  1. Left lung cancer: -Presentation to the ER on 10/06/2019 with cough and dizziness. -CT chest with contrast on 10/06/2019 showed large left suprahilar mass completely obstructing left upper lobe bronchus, measuring 7 x 5.3 x 5.8 cm. Mass extends into AP window and narrows the left mainstem bronchus. Small subpleural nodule in the left lower lobe measuring 4 mm indeterminate. AP window nodal mass measuring 3.4 cm. No supraclavicular adenopathy. Subcarinal enlarged lymph node measures 1.4 cm. -PET scan on 10/14/2019 shows large hypermetabolic left upper lobe mass surrounding the left hilum, extending into pleural surface not invading the chest wall. Direct invasion into the AP window and metastatic subcarinal lymph node, T4 N2 M0. 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 -10 pound weight loss in the last 6 months. Hoarseness for last 1 week. -MRI of the brain on 10/28/2019 with no evidence of metastasis. -Concurrent chemoradiation therapy started on 11/28/2019.Completed 01/02/20 ( chemo) 01/06/2020(radiation) -CT chest on 01/20/2020 showed significant interval decrease in consolidation of the left upper lobe, interval decrease in irregular nodularity and consolidation of the left lower lobe. Unchanged posttreatment appearance of soft tissue about the left hilum and AP window. No discretely enlarged mediastinal or hilar lymph nodes. Trace left pleural effusion, decreased compared to prior exam. -Consolidation durvalumab started on 01/30/2020. -CT chest with contrast on 03/30/2020 with the development of  small left-sided pleural effusion with further volume loss.  Bandlike scarring in the left lower lobe since prior study likely posttreatment changes.  Small bone lesion at T1 stable.  1 small nodule in the right chest which is new about 4 mm.  Will monitor.  2. Social/family history: -He smoked 1 pack/day for 45 years, quit 5 years ago. -No family history of malignancies.   PLAN:  1.Stage III (T4 N2 M0) squamous cell carcinoma of the left lung: -He has tolerated last cycle reasonably well.  His cough and shortness of breath are stable. -He visited ER for severe constipation requiring digital disimpaction. -Recommend stool softener daily. -Reviewed labs which showed normal LFTs and CBC. -Proceed with treatment today and in 2 weeks.  RTC if 4 weeks for follow-up.  2.Cough: -Continue Hycodan twice daily as needed.  3. Atrial fibrillation: -Continue Eliquis 5 mg twice daily.  4.  Weight loss: -At last visit he lost 5 pounds.  He gained 1 pound in the last 2 weeks. -His weight is stable at this time but reports decrease in appetite. -Recommend starting Marinol 5 mg twice daily.  We discussed side effects.  5. Hypertension: -Continue metoprolol 1 tablet in the morning and half tablet in the evening.  6.  Thyroid problem: -His last TSH was 0.203.  T4 was normal at 1.1.  We will closely monitor.  Antithyroid peroxidase antibodies were elevated at 204.  ACTH was normal.  Serum cortisol was also normal.   Orders placed this encounter:  No orders of the defined types were placed in this encounter.    Derek Jack, MD [place] [phone]  Saunders Revel, am acting as a scribe for Dr. Sanda Linger.  I, Derek Jack MD, have reviewed the above documentation for accuracy and completeness, and I agree with the above.

## 2020-05-14 NOTE — Patient Instructions (Signed)
[place] at [facility] Discharge Instructions  You were seen today by Dr. Delton Coombes. He went over your recent results. You received your treatment today; continue getting your treatment every 2 weeks. Take a stool softener once daily. You will be prescribed Marinol 5 mg to take twice daily to improve your appetite. Drink 2-3 cans of Boost/Ensure daily to maintain your weight and energy levels. Dr. Delton Coombes will see you back in 4 weeks for labs and follow up.   Thank you for choosing [place] at [facility] to provide your oncology and hematology care.  To afford each patient quality time with our provider, please arrive at least 15 minutes before your scheduled appointment time.   If you have a lab appointment with the [facility] please come in thru the Main Entrance and check in at the main information desk  You need to re-schedule your appointment should you arrive 10 or more minutes late.  We strive to give you quality time with our providers, and arriving late affects you and other patients whose appointments are after yours.  Also, if you no show three or more times for appointments you may be dismissed from the clinic at the providers discretion.     Again, thank you for choosing [facility].  Our hope is that these requests will decrease the amount of time that you wait before being seen by our physicians.       _____________________________________________________________  Should you have questions after your visit to [facility], please contact our office at [phone] between the hours of 8:00 a.m. and 4:30 p.m.  Voicemails left after 4:00 p.m. will not be returned until the following business day.  For prescription refill requests, have your pharmacy contact our office and allow 72 hours.    Cancer Center Support Programs:   > Cancer Support Group  2nd Tuesday of the month 1pm-2pm, Journey Room

## 2020-05-14 NOTE — Patient Instructions (Signed)
Durvalumab infusion What is this medicine? DURVALUMAB (dur VAL ue mab) is a monoclonal antibody. It is used to treat lung cancer. This medicine may be used for other purposes; ask your health care provider or pharmacist if you have questions. COMMON BRAND NAME(S): [id number] What should I tell my health care provider before I take this medicine? They need to know if you have any of these conditions:  autoimmune diseases like Crohn's disease, ulcerative colitis, or lupus  have had or planning to have an allogeneic stem cell transplant (uses someone else's stem cells)  history of organ transplant  history of radiation to the chest  nervous system problems like myasthenia gravis or Guillain-Barre syndrome  an unusual or allergic reaction to durvalumab, other medicines, foods, dyes, or preservatives  pregnant or trying to get pregnant  breast-feeding How should I use this medicine? This medicine is for infusion into a vein. It is given by a health care professional in a hospital or clinic setting. A special MedGuide will be given to you before each treatment. Be sure to read this information carefully each time. Talk to your pediatrician regarding the use of this medicine in children. Special care may be needed. Overdosage: If you think you have taken too much of this medicine contact a poison control center or emergency room at once. NOTE: This medicine is only for you. Do not share this medicine with others. What if I miss a dose? It is important not to miss your dose. Call your doctor or health care professional if you are unable to keep an appointment. What may interact with this medicine? Interactions have not been studied. This list may not describe all possible interactions. Give your health care provider a list of all the medicines, herbs, non-prescription drugs, or dietary supplements you use. Also tell them if you smoke, drink alcohol, or use illegal drugs. Some items may interact  with your medicine. What should I watch for while using this medicine? This drug may make you feel generally unwell. Continue your course of treatment even though you feel ill unless your doctor tells you to stop. You may need blood work done while you are taking this medicine. Do not become pregnant while taking this medicine or for 3 months after stopping it. Women should inform their doctor if they wish to become pregnant or think they might be pregnant. There is a potential for serious side effects to an unborn child. Talk to your health care professional or pharmacist for more information. Do not breast-feed an infant while taking this medicine or for 3 months after stopping it. What side effects may I notice from receiving this medicine? Side effects that you should report to your doctor or health care professional as soon as possible:  allergic reactions like skin rash, itching or hives, swelling of the face, lips, or tongue  black, tarry stools  bloody or watery diarrhea  breathing problems  change in emotions or moods  change in sex drive  changes in vision  chest pain or chest tightness  chills  confusion  cough  facial flushing  fever  headache  signs and symptoms of high blood sugar such as dizziness; dry mouth; dry skin; fruity breath; nausea; stomach pain; increased hunger or thirst; increased urination  signs and symptoms of liver injury like dark yellow or [place] urine; general ill feeling or flu-like symptoms; light-colored stools; loss of appetite; nausea; right upper belly pain; unusually weak or tired; yellowing of the eyes or skin  stomach pain  trouble passing urine or change in the amount of urine  weight gain or weight loss Side effects that usually do not require medical attention (report these to your doctor or health care professional if they continue or are bothersome):  bone pain  constipation  loss of appetite  muscle  pain  nausea  swelling of the ankles, feet, hands  tiredness This list may not describe all possible side effects. Call your doctor for medical advice about side effects. You may report side effects to FDA at 1-800-FDA-1088. Where should I keep my medicine? This drug is given in a hospital or clinic and will not be stored at home. NOTE: This sheet is a summary. It may not cover all possible information. If you have questions about this medicine, talk to your doctor, pharmacist, or health care provider.  2021 Elsevier/Gold Standard (2019-04-14 13:01:29)

## 2020-05-14 NOTE — Progress Notes (Signed)
Patient was assessed by Dr. Katragadda and labs have been reviewed.  Patient is okay to proceed with treatment today. Primary RN and pharmacy aware.   

## 2020-05-28 ENCOUNTER — Other Ambulatory Visit: Payer: Self-pay

## 2020-05-28 ENCOUNTER — Ambulatory Visit (HOSPITAL_COMMUNITY): Payer: Medicare Other | Admitting: Hematology and Oncology

## 2020-05-28 ENCOUNTER — Inpatient Hospital Stay (HOSPITAL_COMMUNITY): Payer: Medicare Other | Attending: Hematology and Oncology

## 2020-05-28 ENCOUNTER — Inpatient Hospital Stay (HOSPITAL_COMMUNITY): Payer: Medicare Other

## 2020-05-28 VITALS — BP 101/60 | HR 80 | Temp 96.9°F | Resp 18

## 2020-05-28 DIAGNOSIS — I4891 Unspecified atrial fibrillation: Secondary | ICD-10-CM | POA: Insufficient documentation

## 2020-05-28 DIAGNOSIS — I10 Essential (primary) hypertension: Secondary | ICD-10-CM | POA: Diagnosis not present

## 2020-05-28 DIAGNOSIS — R634 Abnormal weight loss: Secondary | ICD-10-CM | POA: Diagnosis not present

## 2020-05-28 DIAGNOSIS — Z95828 Presence of other vascular implants and grafts: Secondary | ICD-10-CM

## 2020-05-28 DIAGNOSIS — C3412 Malignant neoplasm of upper lobe, left bronchus or lung: Secondary | ICD-10-CM | POA: Diagnosis present

## 2020-05-28 DIAGNOSIS — Z7901 Long term (current) use of anticoagulants: Secondary | ICD-10-CM | POA: Insufficient documentation

## 2020-05-28 DIAGNOSIS — C3492 Malignant neoplasm of unspecified part of left bronchus or lung: Secondary | ICD-10-CM

## 2020-05-28 DIAGNOSIS — Z5112 Encounter for antineoplastic immunotherapy: Secondary | ICD-10-CM | POA: Insufficient documentation

## 2020-05-28 LAB — CBC WITH DIFFERENTIAL/PLATELET
Abs Immature Granulocytes: 0.03 10*3/uL (ref 0.00–0.07)
Basophils Absolute: 0.1 10*3/uL (ref 0.0–0.1)
Basophils Relative: 1 %
Eosinophils Absolute: 0.4 10*3/uL (ref 0.0–0.5)
Eosinophils Relative: 6 %
HCT: 37.2 % — ABNORMAL LOW (ref 39.0–52.0)
Hemoglobin: 11.9 g/dL — ABNORMAL LOW (ref 13.0–17.0)
Immature Granulocytes: 0 %
Lymphocytes Relative: 9 %
Lymphs Abs: 0.6 10*3/uL — ABNORMAL LOW (ref 0.7–4.0)
MCH: 30.4 pg (ref 26.0–34.0)
MCHC: 32 g/dL (ref 30.0–36.0)
MCV: 95.1 fL (ref 80.0–100.0)
Monocytes Absolute: 0.6 10*3/uL (ref 0.1–1.0)
Monocytes Relative: 8 %
Neutro Abs: 5 10*3/uL (ref 1.7–7.7)
Neutrophils Relative %: 76 %
Platelets: 119 10*3/uL — ABNORMAL LOW (ref 150–400)
RBC: 3.91 MIL/uL — ABNORMAL LOW (ref 4.22–5.81)
RDW: 14.5 % (ref 11.5–15.5)
WBC: 6.7 10*3/uL (ref 4.0–10.5)
nRBC: 0 % (ref 0.0–0.2)

## 2020-05-28 LAB — COMPREHENSIVE METABOLIC PANEL
ALT: 23 U/L (ref 0–44)
AST: 26 U/L (ref 15–41)
Albumin: 3.3 g/dL — ABNORMAL LOW (ref 3.5–5.0)
Alkaline Phosphatase: 63 U/L (ref 38–126)
Anion gap: 8 (ref 5–15)
BUN: 14 mg/dL (ref 8–23)
CO2: 26 mmol/L (ref 22–32)
Calcium: 8.7 mg/dL — ABNORMAL LOW (ref 8.9–10.3)
Chloride: 104 mmol/L (ref 98–111)
Creatinine, Ser: 1.17 mg/dL (ref 0.61–1.24)
GFR, Estimated: >60 mL/min (ref >60)
Glucose, Bld: 121 mg/dL — ABNORMAL HIGH (ref 70–99)
Potassium: 3.7 mmol/L (ref 3.5–5.1)
Sodium: 138 mmol/L (ref 135–145)
Total Bilirubin: 0.4 mg/dL (ref 0.3–1.2)
Total Protein: 6.5 g/dL (ref 6.5–8.1)

## 2020-05-28 LAB — MAGNESIUM: Magnesium: 1.6 mg/dL — ABNORMAL LOW (ref 1.7–2.4)

## 2020-05-28 MED ORDER — SODIUM CHLORIDE 0.9% FLUSH
10.0000 mL | INTRAVENOUS | Status: DC | PRN
  Administered 2020-05-28: 10 mL

## 2020-05-28 MED ORDER — SODIUM CHLORIDE 0.9 % IV SOLN
10.0000 mg/kg | Freq: Once | INTRAVENOUS | Status: AC
  Administered 2020-05-28: 740 mg via INTRAVENOUS
  Filled 2020-05-28: qty 10

## 2020-05-28 MED ORDER — SODIUM CHLORIDE 0.9 % IV SOLN: Freq: Once | INTRAVENOUS | Status: AC

## 2020-05-28 MED ORDER — HEPARIN SOD (PORK) LOCK FLUSH 100 UNIT/ML IV SOLN
500.0000 [IU] | Freq: Once | INTRAVENOUS | Status: AC | PRN
  Administered 2020-05-28: 500 [IU]

## 2020-05-28 NOTE — Patient Instructions (Signed)
Durvalumab infusion What is this medicine? DURVALUMAB (dur VAL ue mab) is a monoclonal antibody. It is used to treat lung cancer. This medicine may be used for other purposes; ask your health care provider or pharmacist if you have questions. COMMON BRAND NAME(S): IMFINZI What should I tell my health care provider before I take this medicine? They need to know if you have any of these conditions:  autoimmune diseases like Crohn's disease, ulcerative colitis, or lupus  have had or planning to have an allogeneic stem cell transplant (uses someone else's stem cells)  history of organ transplant  history of radiation to the chest  nervous system problems like myasthenia gravis or Guillain-Barre syndrome  an unusual or allergic reaction to durvalumab, other medicines, foods, dyes, or preservatives  pregnant or trying to get pregnant  breast-feeding How should I use this medicine? This medicine is for infusion into a vein. It is given by a health care professional in a hospital or clinic setting. A special MedGuide will be given to you before each treatment. Be sure to read this information carefully each time. Talk to your pediatrician regarding the use of this medicine in children. Special care may be needed. Overdosage: If you think you have taken too much of this medicine contact a poison control center or emergency room at once. NOTE: This medicine is only for you. Do not share this medicine with others. What if I miss a dose? It is important not to miss your dose. Call your doctor or health care professional if you are unable to keep an appointment. What may interact with this medicine? Interactions have not been studied. This list may not describe all possible interactions. Give your health care provider a list of all the medicines, herbs, non-prescription drugs, or dietary supplements you use. Also tell them if you smoke, drink alcohol, or use illegal drugs. Some items may interact  with your medicine. What should I watch for while using this medicine? This drug may make you feel generally unwell. Continue your course of treatment even though you feel ill unless your doctor tells you to stop. You may need blood work done while you are taking this medicine. Do not become pregnant while taking this medicine or for 3 months after stopping it. Women should inform their doctor if they wish to become pregnant or think they might be pregnant. There is a potential for serious side effects to an unborn child. Talk to your health care professional or pharmacist for more information. Do not breast-feed an infant while taking this medicine or for 3 months after stopping it. What side effects may I notice from receiving this medicine? Side effects that you should report to your doctor or health care professional as soon as possible:  allergic reactions like skin rash, itching or hives, swelling of the face, lips, or tongue  black, tarry stools  bloody or watery diarrhea  breathing problems  change in emotions or moods  change in sex drive  changes in vision  chest pain or chest tightness  chills  confusion  cough  facial flushing  fever  headache  signs and symptoms of high blood sugar such as dizziness; dry mouth; dry skin; fruity breath; nausea; stomach pain; increased hunger or thirst; increased urination  signs and symptoms of liver injury like dark yellow or Howry urine; general ill feeling or flu-like symptoms; light-colored stools; loss of appetite; nausea; right upper belly pain; unusually weak or tired; yellowing of the eyes or skin  stomach pain  trouble passing urine or change in the amount of urine  weight gain or weight loss Side effects that usually do not require medical attention (report these to your doctor or health care professional if they continue or are bothersome):  bone pain  constipation  loss of appetite  muscle  pain  nausea  swelling of the ankles, feet, hands  tiredness This list may not describe all possible side effects. Call your doctor for medical advice about side effects. You may report side effects to FDA at 1-800-FDA-1088. Where should I keep my medicine? This drug is given in a hospital or clinic and will not be stored at home. NOTE: This sheet is a summary. It may not cover all possible information. If you have questions about this medicine, talk to your doctor, pharmacist, or health care provider.  2021 Elsevier/Gold Standard (2019-04-14 13:01:29)

## 2020-05-28 NOTE — Progress Notes (Signed)
Patient presents today for Imfinzi infusion.  Vital signs and Labs within parameters for treatment.  Patient has no new complaints at this time.  Imfinzi infusion given today per MD orders.  Stable during infusion without adverse affects.  Vital signs stable.  No complaints at this time.  Discharge from clinic ambulatory in stable condition.  Alert and oriented X 3.  Follow up with [facility] as scheduled.

## 2020-05-28 NOTE — Progress Notes (Signed)
Patients port flushed without difficulty.  Good blood return noted with no bruising or swelling noted at site.  stable during access and blood draw.  Vital signs WNL.

## 2020-05-30 ENCOUNTER — Other Ambulatory Visit (HOSPITAL_COMMUNITY): Payer: Self-pay | Admitting: Hematology

## 2020-05-31 ENCOUNTER — Other Ambulatory Visit (HOSPITAL_COMMUNITY): Payer: Self-pay | Admitting: Hematology

## 2020-05-31 DIAGNOSIS — C3492 Malignant neoplasm of unspecified part of left bronchus or lung: Secondary | ICD-10-CM

## 2020-06-11 ENCOUNTER — Inpatient Hospital Stay (HOSPITAL_COMMUNITY): Payer: Medicare Other

## 2020-06-11 ENCOUNTER — Inpatient Hospital Stay (HOSPITAL_BASED_OUTPATIENT_CLINIC_OR_DEPARTMENT_OTHER): Payer: Medicare Other | Admitting: Hematology

## 2020-06-11 ENCOUNTER — Other Ambulatory Visit: Payer: Self-pay

## 2020-06-11 ENCOUNTER — Encounter (HOSPITAL_COMMUNITY): Payer: Self-pay | Admitting: Hematology

## 2020-06-11 VITALS — BP 151/61 | HR 56 | Temp 97.0°F | Resp 18 | Wt 167.1 lb

## 2020-06-11 VITALS — BP 107/58 | HR 87 | Temp 97.2°F | Resp 18

## 2020-06-11 DIAGNOSIS — Z95828 Presence of other vascular implants and grafts: Secondary | ICD-10-CM

## 2020-06-11 DIAGNOSIS — C3492 Malignant neoplasm of unspecified part of left bronchus or lung: Secondary | ICD-10-CM

## 2020-06-11 DIAGNOSIS — Z5112 Encounter for antineoplastic immunotherapy: Secondary | ICD-10-CM | POA: Diagnosis not present

## 2020-06-11 LAB — CBC WITH DIFFERENTIAL/PLATELET
Abs Immature Granulocytes: 0.03 10*3/uL (ref 0.00–0.07)
Basophils Absolute: 0.1 10*3/uL (ref 0.0–0.1)
Basophils Relative: 1 %
Eosinophils Absolute: 0.3 10*3/uL (ref 0.0–0.5)
Eosinophils Relative: 5 %
HCT: 37.5 % — ABNORMAL LOW (ref 39.0–52.0)
Hemoglobin: 12 g/dL — ABNORMAL LOW (ref 13.0–17.0)
Immature Granulocytes: 0 %
Lymphocytes Relative: 9 %
Lymphs Abs: 0.6 10*3/uL — ABNORMAL LOW (ref 0.7–4.0)
MCH: 30.8 pg (ref 26.0–34.0)
MCHC: 32 g/dL (ref 30.0–36.0)
MCV: 96.2 fL (ref 80.0–100.0)
Monocytes Absolute: 0.5 10*3/uL (ref 0.1–1.0)
Monocytes Relative: 8 %
Neutro Abs: 5.3 10*3/uL (ref 1.7–7.7)
Neutrophils Relative %: 77 %
Platelets: 149 10*3/uL — ABNORMAL LOW (ref 150–400)
RBC: 3.9 MIL/uL — ABNORMAL LOW (ref 4.22–5.81)
RDW: 15.1 % (ref 11.5–15.5)
WBC: 6.8 10*3/uL (ref 4.0–10.5)
nRBC: 0 % (ref 0.0–0.2)

## 2020-06-11 LAB — COMPREHENSIVE METABOLIC PANEL
ALT: 21 U/L (ref 0–44)
AST: 25 U/L (ref 15–41)
Albumin: 3.4 g/dL — ABNORMAL LOW (ref 3.5–5.0)
Alkaline Phosphatase: 62 U/L (ref 38–126)
Anion gap: 9 (ref 5–15)
BUN: 11 mg/dL (ref 8–23)
CO2: 27 mmol/L (ref 22–32)
Calcium: 9 mg/dL (ref 8.9–10.3)
Chloride: 101 mmol/L (ref 98–111)
Creatinine, Ser: 1.16 mg/dL (ref 0.61–1.24)
GFR, Estimated: >60 mL/min (ref >60)
Glucose, Bld: 115 mg/dL — ABNORMAL HIGH (ref 70–99)
Potassium: 3.8 mmol/L (ref 3.5–5.1)
Sodium: 137 mmol/L (ref 135–145)
Total Bilirubin: 0.5 mg/dL (ref 0.3–1.2)
Total Protein: 6.7 g/dL (ref 6.5–8.1)

## 2020-06-11 LAB — MAGNESIUM: Magnesium: 1.8 mg/dL (ref 1.7–2.4)

## 2020-06-11 MED ORDER — SODIUM CHLORIDE 0.9 % IV SOLN: Freq: Once | INTRAVENOUS | Status: AC

## 2020-06-11 MED ORDER — SODIUM CHLORIDE 0.9 % IV SOLN
10.0000 mg/kg | Freq: Once | INTRAVENOUS | Status: AC
  Administered 2020-06-11: 740 mg via INTRAVENOUS
  Filled 2020-06-11: qty 10

## 2020-06-11 MED ORDER — SODIUM CHLORIDE 0.9% FLUSH
10.0000 mL | INTRAVENOUS | Status: DC | PRN
  Administered 2020-06-11: 10 mL

## 2020-06-11 MED ORDER — HEPARIN SOD (PORK) LOCK FLUSH 100 UNIT/ML IV SOLN
500.0000 [IU] | Freq: Once | INTRAVENOUS | Status: AC | PRN
  Administered 2020-06-11: 500 [IU]

## 2020-06-11 NOTE — Patient Instructions (Signed)
[facility]  Discharge Instructions: Thank you for choosing [facility] to provide your oncology and hematology care.  If you have a lab appointment with the [facility], please come in thru the Main Entrance and check in at the main information desk.  Wear comfortable clothing and clothing appropriate for easy access to any Portacath or PICC line.   We strive to give you quality time with your provider. You may need to reschedule your appointment if you arrive late (15 or more minutes).  Arriving late affects you and other patients whose appointments are after yours.  Also, if you miss three or more appointments without notifying the office, you may be dismissed from the clinic at the provider's discretion.      For prescription refill requests, have your pharmacy contact our office and allow 72 hours for refills to be completed.    Today you received the following chemotherapy and/or immunotherapy agents imfinzi.    To help prevent nausea and vomiting after your treatment, we encourage you to take your nausea medication as directed.  BELOW ARE SYMPTOMS THAT SHOULD BE REPORTED IMMEDIATELY: . *FEVER GREATER THAN 100.4 F (38 C) OR HIGHER . *CHILLS OR SWEATING . *NAUSEA AND VOMITING THAT IS NOT CONTROLLED WITH YOUR NAUSEA MEDICATION . *UNUSUAL SHORTNESS OF BREATH . *UNUSUAL BRUISING OR BLEEDING . *URINARY PROBLEMS (pain or burning when urinating, or frequent urination) . *BOWEL PROBLEMS (unusual diarrhea, constipation, pain near the anus) . TENDERNESS IN MOUTH AND THROAT WITH OR WITHOUT PRESENCE OF ULCERS (sore throat, sores in mouth, or a toothache) . UNUSUAL RASH, SWELLING OR PAIN  . UNUSUAL VAGINAL DISCHARGE OR ITCHING   Items with * indicate a potential emergency and should be followed up as soon as possible or go to the Emergency Department if any problems should occur.  Please show the CHEMOTHERAPY ALERT CARD or IMMUNOTHERAPY ALERT CARD at check-in to the  Emergency Department and triage nurse.  Should you have questions after your visit or need to cancel or reschedule your appointment, please contact [facility] [phone]  and follow the prompts.  Office hours are 8:00 a.m. to 4:30 p.m. Monday - Friday. Please note that voicemails left after 4:00 p.m. may not be returned until the following business day.  We are closed weekends and major holidays. You have access to a nurse at all times for urgent questions. Please call the main number to the clinic [phone] and follow the prompts.  For any non-urgent questions, you may also contact your provider using MyChart. We now offer e-Visits for anyone 58 and older to request care online for non-urgent symptoms. For details visit [email or web address].   Also download the MyChart app! Go to the app store, search "MyChart", open the app, select [facility], and log in with your MyChart username and password.  Due to Covid, a mask is required upon entering the hospital/clinic. If you do not have a mask, one will be given to you upon arrival. For doctor visits, patients may have 1 support person aged 33 or older with them. For treatment visits, patients cannot have anyone with them due to current Covid guidelines and our immunocompromised population.

## 2020-06-11 NOTE — Patient Instructions (Signed)

## 2020-06-11 NOTE — Progress Notes (Signed)
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  PRIOR THERAPY: Concurrent chemoradiation with weekly carboplatin and paclitaxel x 6 cycles from 11/28/2019 to 01/02/2020  NGS Results: Not done  CURRENT THERAPY: Durvalumab every 2 weeks  BRIEF ONCOLOGIC HISTORY:  Oncology History  Squamous cell lung cancer, left ([facility])  11/08/2019 Initial Diagnosis   Squamous cell lung cancer, left ([facility])   11/08/2019 Cancer Staging   Staging form: Lung, AJCC 8th Edition - Clinical stage from 11/08/2019: Stage IIIB (cT4, cN2, cM0) - Signed by Derek Jack, MD on 11/08/2019   11/28/2019 - 01/02/2020 Chemotherapy   The patient had palonosetron (ALOXI) injection 0.25 mg, 0.25 mg, Intravenous,  Once, 6 of 6 cycles Administration: 0.25 mg (11/28/2019), 0.25 mg (12/05/2019), 0.25 mg (12/12/2019), 0.25 mg (12/19/2019), 0.25 mg (12/26/2019), 0.25 mg (01/02/2020) CARBOplatin (PARAPLATIN) 180 mg in sodium chloride 0.9 % 250 mL chemo infusion, 180 mg (100 % of original dose 176 mg), Intravenous,  Once, 6 of 6 cycles Dose modification:   (original dose 176 mg, Cycle 1),   (original dose 201.2 mg, Cycle 2),   (original dose 179.2 mg, Cycle 3),   (original dose 179.2 mg, Cycle 4),   (original dose 195.4 mg, Cycle 5),   (original dose 178.2 mg, Cycle 6) Administration: 180 mg (11/28/2019), 200 mg (12/05/2019), 180 mg (12/12/2019), 180 mg (12/19/2019), 190 mg (12/26/2019), 180 mg (01/02/2020) PACLitaxel (TAXOL) 84 mg in sodium chloride 0.9 % 250 mL chemo infusion (</= 80mg /m2), 45 mg/m2 = 84 mg, Intravenous,  Once, 6 of 6 cycles Administration: 84 mg (11/28/2019), 84 mg (12/05/2019), 84 mg (12/12/2019), 84 mg (12/19/2019), 84 mg (12/26/2019), 84 mg (01/02/2020)  for chemotherapy treatment.    01/30/2020  -  Chemotherapy    Patient is on Treatment Plan: LUNG DURVALUMAB Q14D        CANCER STAGING: Cancer Staging Squamous cell lung cancer, left ([facility]) Staging form: Lung, AJCC 8th Edition - Clinical stage from 11/08/2019: Stage IIIB (cT4, cN2, cM0) - Signed by Derek Jack, MD on 11/08/2019   INTERVAL HISTORY:  [name], a 70 y.o. male, returns for routine follow-up and consideration for next cycle of immunotherapy. Dequavius was last seen on 05/14/2020.  Due for cycle #9 of durvalumab today.   Today he is accompanied by his wife. Overall, he tells me he has been feeling okay. His cough has improved but his voice is more hoarse. He has not seen the ENT yet for his voice. He continues having SOB with exertion. His appetite has improved with Marinol. He continues taking Eliquis BID and denies having nosebleeds, hematochezia or hematuria. He reports having left costochondral pain when he touches it since finishing radiation.  Overall, he feels ready for next cycle of immunotherapy today.    REVIEW OF SYSTEMS:  Review of Systems  Constitutional: Positive for appetite change (75%) and fatigue (50%). Negative for unexpected weight change.  HENT:   Negative for nosebleeds.        Voice more hoarse  Respiratory: Positive for cough (improved) and shortness of breath (w/ exertion).   Cardiovascular: Positive for chest pain (L costochondral pain).  Gastrointestinal: Negative for blood in stool.  Genitourinary: Negative for hematuria.   All other systems reviewed and are negative.   PAST MEDICAL/SURGICAL HISTORY:  Past Medical History:  Diagnosis Date  . Arthritis   . Hyperlipemia   . Hypertension   . Hyperthyroidism   . Lung cancer ([facility])   . Myocardial infarction ([facility])   . Paroxysmal atrial fibrillation ([facility])   . Port-A-Cath in place 11/14/2019  . Tachycardia    Past Surgical History:  Procedure Laterality Date  . CARDIAC CATHETERIZATION    . PORTACATH PLACEMENT Right  11/01/2019   Procedure: INSERTION PORT-A-CATH;  Surgeon: Aviva Signs, MD;  Location: [facility];  Service: General;  Laterality: Right;    SOCIAL HISTORY:  Social History   Socioeconomic History  . Marital status: Married    Spouse name: Not on file  . Number of children: 1  . Years of education: Not on file  . Highest education level: Not on file  Occupational History  . Occupation: retired  . Occupation: Glass blower/designer: [employer]  Tobacco Use  . Smoking status: Former Smoker    Years: 45.00    Types: 36, Cigarettes    Quit date: 10/19/2014    Years since quitting: 5.6  . Smokeless tobacco: Never Used  Vaping Use  . Vaping Use: Never used  Substance and Sexual Activity  . Alcohol use: Never  . Drug use: No  . Sexual activity: Not on file  Other Topics Concern  . Not on file  Social History Narrative  . Not on file   Social Determinants of Health   Financial Resource Strain: Medium Risk  . Difficulty of Paying Living Expenses: Somewhat hard  Food Insecurity: No Food Insecurity  . Worried About Charity fundraiser in the Last Year: Never true  . Ran Out of Food in the Last Year: Never true  Transportation Needs: No Transportation Needs  . Lack of Transportation (Medical): No  . Lack of Transportation (Non-Medical): No  Physical Activity: Inactive  . Days of Exercise per Week: 0 days  . Minutes of Exercise per Session: 0 min  Stress: Stress Concern Present  . Feeling of Stress : Very much  Social Connections: Moderately Isolated  . Frequency of Communication with Friends and Family: More than three times a week  . Frequency of Social Gatherings with Friends and Family: More than three times a week  . Attends Religious Services: Never  . Active Member of Clubs or Organizations: No  . Attends Archivist Meetings: Never  . Marital Status: Married  Human resources officer Violence: Not At Risk  . Fear of Current or Ex-Partner: No  . Emotionally Abused: No   . Physically Abused: No  . Sexually Abused: No    FAMILY HISTORY:  Family History  Problem Relation Age of Onset  . Hypertension Father   . Dementia Father        died age 62  . Heart disease Father   . Heart failure Other     CURRENT MEDICATIONS:  Current Outpatient Medications  Medication Sig Dispense Refill  . acetaminophen (TYLENOL) 500 MG tablet Take 500 mg by mouth every 6 (six) hours as needed for moderate pain or headache.    . albuterol (PROVENTIL) (2.5 MG/3ML) 0.083% nebulizer solution Take 3 mLs (2.5 mg total) by nebulization every 6 (six) hours as needed for wheezing or shortness of breath. 75 mL 12  . ALPRAZolam (XANAX) 0.25 MG tablet TAKE 1 TABLET BY MOUTH THREE TIMES DAILY AS NEEDED FOR ANXIETY 30 tablet 3  . apixaban (ELIQUIS) 5 MG TABS tablet Take 1 tablet (5 mg  total) by mouth 2 (two) times daily. 60 tablet 6  . dronabinol (MARINOL) 5 MG capsule Take 1 capsule (5 mg total) by mouth 2 (two) times daily before a meal. 60 capsule 2  . HYDROcodone-homatropine (HYDROMET) 5-1.5 MG/5ML syrup take 15 mls BY MOUTH EVERY 8 HOURS AS NEEDED FOR cough 473 mL 0  . lidocaine (XYLOCAINE) 2 % solution TAKE 15 ML BY MOUTH 4 TIMES DAILY WITH  MEALS 480 mL 0  . lidocaine-prilocaine (EMLA) cream Apply 1 application topically as needed.    . metoprolol tartrate (LOPRESSOR) 25 MG tablet Take one tablet(25 mg )  in the AM and take 1/2 Tablet (12.5mg ) in the PM 135 tablet 3   No current facility-administered medications for this visit.    ALLERGIES:  No Known Allergies  PHYSICAL EXAM:  Performance status (ECOG): 1 - Symptomatic but completely ambulatory  Vitals:   06/11/20 0920  BP: (!) 151/61  Pulse: (!) 56  Resp: 18  Temp: (!) 97 F (36.1 C)  SpO2: 95%   Wt Readings from Last 3 Encounters:  06/11/20 167 lb 1.6 oz (75.8 kg)  05/28/20 161 lb 9.6 oz (73.3 kg)  05/14/20 158 lb 11.2 oz (72 kg)   Physical Exam Vitals reviewed.  Constitutional:      Appearance: Normal  appearance.  Cardiovascular:     Rate and Rhythm: Normal rate and regular rhythm.     Pulses: Normal pulses.     Heart sounds: Normal heart sounds.  Pulmonary:     Effort: Pulmonary effort is normal.     Breath sounds: Normal breath sounds.  Chest:     Comments: Port-a-Cath in R chest Musculoskeletal:     Right lower leg: No edema.     Left lower leg: No edema.  Neurological:     General: No focal deficit present.     Mental Status: He is alert and oriented to person, place, and time.  Psychiatric:        Mood and Affect: Mood normal.        Behavior: Behavior normal.     LABORATORY DATA:  I have reviewed the labs as listed.  CBC Latest Ref Rng & Units 06/11/2020 05/28/2020 05/14/2020  WBC 4.0 - 10.5 K/uL 6.8 6.7 6.5  Hemoglobin 13.0 - 17.0 g/dL 12.0(L) 11.9(L) 12.0(L)  Hematocrit 39.0 - 52.0 % 37.5(L) 37.2(L) 37.9(L)  Platelets 150 - 400 K/uL 149(L) 119(L) 168   CMP Latest Ref Rng & Units 06/11/2020 05/28/2020 05/14/2020  Glucose 70 - 99 mg/dL 115(H) 121(H) 113(H)  BUN 8 - 23 mg/dL 11 14 22   Creatinine 0.61 - 1.24 mg/dL 1.16 1.17 1.05  Sodium 135 - 145 mmol/L 137 138 134(L)  Potassium 3.5 - 5.1 mmol/L 3.8 3.7 4.1  Chloride 98 - 111 mmol/L 101 104 100  CO2 22 - 32 mmol/L 27 26 25   Calcium 8.9 - 10.3 mg/dL 9.0 8.7(L) 8.9  Total Protein 6.5 - 8.1 g/dL 6.7 6.5 7.2  Total Bilirubin 0.3 - 1.2 mg/dL 0.5 0.4 0.3  Alkaline Phos 38 - 126 U/L 62 63 62  AST 15 - 41 U/L 25 26 26   ALT 0 - 44 U/L 21 23 20     DIAGNOSTIC IMAGING:  I have independently reviewed the scans and discussed with the patient. No results found.   ASSESSMENT:  1. Left lung cancer: -Presentation to the ER on 10/06/2019 with cough and dizziness. -CT chest with contrast on 10/06/2019 showed large left suprahilar mass completely obstructing left upper  lobe bronchus, measuring 7 x 5.3 x 5.8 cm. Mass extends into AP window and narrows the left mainstem bronchus. Small subpleural nodule in the left lower lobe  measuring 4 mm indeterminate. AP window nodal mass measuring 3.4 cm. No supraclavicular adenopathy. Subcarinal enlarged lymph node measures 1.4 cm. -PET scan on 10/14/2019 shows large hypermetabolic left upper lobe mass surrounding the left hilum, extending into pleural surface not invading the chest wall. Direct invasion into the AP window and metastatic subcarinal lymph node, T4 N2 M0. 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 -10 pound weight loss in the last 6 months. Hoarseness for last 1 week. -MRI of the brain on 10/28/2019 with no evidence of metastasis. -Concurrent chemoradiation therapy started on 11/28/2019.Completed 01/02/20 ( chemo) 01/06/2020(radiation) -CT chest on 01/20/2020 showed significant interval decrease in consolidation of the left upper lobe, interval decrease in irregular nodularity and consolidation of the left lower lobe. Unchanged posttreatment appearance of soft tissue about the left hilum and AP window. No discretely enlarged mediastinal or hilar lymph nodes. Trace left pleural effusion, decreased compared to prior exam. -Consolidation durvalumab started on 01/30/2020. -CT chest with contrast on 03/30/2020 with the development of small left-sided pleural effusion with further volume loss.  Bandlike scarring in the left lower lobe since prior study likely posttreatment changes.  Small bone lesion at T1 stable.  1 small nodule in the right chest which is new about 4 mm.  Will monitor.  2. Social/family history: -He smoked 1 pack/day for 45 years, quit 5 years ago. -No family history of malignancies.   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  Cough and shortness of breath has improved. - However he continues to have hoarseness of voice.  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  Would  recommend consult with Dr. Benjamine Mola. - Reviewed his labs which showed normal LFTs and CBC. - Proceed with treatment today and in 2 weeks.  RTC 4 weeks for follow-up.  2.Cough: -Continue Hycodan twice daily as needed.  Cough is improved.  3. Atrial fibrillation: -Continue Eliquis 5 mg twice daily.  4.  Weight loss: -Continue Megace 5 mg twice daily.  He gained 7 pounds. - He is continuing to gain weight, recommend cutting back on Megace to once a day.  5. Hypertension: -Continue metoprolol 1 tablet in the morning and half tablet in the evening.  6. Thyroid problem: -TSH was low and T4 was normal.  Antithyroid peroxidase antibodies were elevated.  ACTH and cortisol were normal.  We will closely monitor.   Orders placed this encounter:  No orders of the defined types were placed in this encounter.    Derek Jack, MD [facility] [phone]   I, Milinda Antis, am acting as a scribe for Dr. Sanda Linger.  I, Derek Jack MD, have reviewed the above documentation for accuracy and completeness, and I agree with the above.

## 2020-06-11 NOTE — Patient Instructions (Addendum)
[facility] at [facility] Discharge Instructions  You were seen today by Dr. Delton Coombes. He went over your recent results. You received your treatment today; continue getting your treatment every 2 weeks. You will be referred to ENT, Dr. Benjamine Mola in [place], for your voice hoarseness. You will have CT scans done every 3 months. Once you reach your pre-treatment weight, take Marinol only once daily. Dr. Delton Coombes will see you back in 1 month for labs and follow up.   Thank you for choosing [facility] at [facility] to provide your oncology and hematology care.  To afford each patient quality time with our provider, please arrive at least 15 minutes before your scheduled appointment time.   If you have a lab appointment with the [facility] please come in thru the Main Entrance and check in at the main information desk  You need to re-schedule your appointment should you arrive 10 or more minutes late.  We strive to give you quality time with our providers, and arriving late affects you and other patients whose appointments are after yours.  Also, if you no show three or more times for appointments you may be dismissed from the clinic at the providers discretion.     Again, thank you for choosing [facility]  Our hope is that these requests will decrease the amount of time that you wait before being seen by our physicians.       _____________________________________________________________  Should you have questions after your visit to [facility], please contact our office at [phone] between the hours of 8:00 a.m. and 4:30 p.m.  Voicemails left after 4:00 p.m. will not be returned until the following business day.  For prescription refill requests, have your pharmacy contact our office and allow 72 hours.    Cancer Center Support Programs:   > Cancer Support Group  2nd Tuesday of the month 1pm-2pm, Journey Room

## 2020-06-11 NOTE — Progress Notes (Signed)
Pt here for D1C9 of imfinzi.  No complaints to note.  Okay for treatment per Dr Raliegh Ip.   Tolerated treatment well today without incidence.  Pt stable during and after treatment.  Vital signs stable prior to discharge.  AVS reviewed.  Pt discharged in stable condition ambulatory.

## 2020-06-26 ENCOUNTER — Other Ambulatory Visit: Payer: Self-pay

## 2020-06-26 ENCOUNTER — Encounter (HOSPITAL_COMMUNITY): Payer: Self-pay

## 2020-06-26 ENCOUNTER — Inpatient Hospital Stay (HOSPITAL_COMMUNITY): Payer: Medicare Other | Attending: Hematology and Oncology

## 2020-06-26 ENCOUNTER — Inpatient Hospital Stay (HOSPITAL_COMMUNITY): Payer: Medicare Other

## 2020-06-26 VITALS — BP 121/67 | HR 109 | Temp 97.1°F | Resp 18 | Wt 167.0 lb

## 2020-06-26 VITALS — BP 119/76 | HR 71 | Temp 97.0°F | Resp 18

## 2020-06-26 DIAGNOSIS — Z79899 Other long term (current) drug therapy: Secondary | ICD-10-CM | POA: Insufficient documentation

## 2020-06-26 DIAGNOSIS — I10 Essential (primary) hypertension: Secondary | ICD-10-CM | POA: Insufficient documentation

## 2020-06-26 DIAGNOSIS — I4891 Unspecified atrial fibrillation: Secondary | ICD-10-CM | POA: Insufficient documentation

## 2020-06-26 DIAGNOSIS — Z95828 Presence of other vascular implants and grafts: Secondary | ICD-10-CM

## 2020-06-26 DIAGNOSIS — Z5112 Encounter for antineoplastic immunotherapy: Secondary | ICD-10-CM | POA: Diagnosis present

## 2020-06-26 DIAGNOSIS — C3492 Malignant neoplasm of unspecified part of left bronchus or lung: Secondary | ICD-10-CM

## 2020-06-26 DIAGNOSIS — C3412 Malignant neoplasm of upper lobe, left bronchus or lung: Secondary | ICD-10-CM | POA: Diagnosis present

## 2020-06-26 DIAGNOSIS — E039 Hypothyroidism, unspecified: Secondary | ICD-10-CM | POA: Insufficient documentation

## 2020-06-26 DIAGNOSIS — R059 Cough, unspecified: Secondary | ICD-10-CM | POA: Diagnosis not present

## 2020-06-26 LAB — COMPREHENSIVE METABOLIC PANEL
ALT: 25 U/L (ref 0–44)
AST: 31 U/L (ref 15–41)
Albumin: 3.7 g/dL (ref 3.5–5.0)
Alkaline Phosphatase: 67 U/L (ref 38–126)
Anion gap: 7 (ref 5–15)
BUN: 17 mg/dL (ref 8–23)
CO2: 26 mmol/L (ref 22–32)
Calcium: 8.9 mg/dL (ref 8.9–10.3)
Chloride: 101 mmol/L (ref 98–111)
Creatinine, Ser: 1.13 mg/dL (ref 0.61–1.24)
GFR, Estimated: >60 mL/min (ref >60)
Glucose, Bld: 104 mg/dL — ABNORMAL HIGH (ref 70–99)
Potassium: 4.3 mmol/L (ref 3.5–5.1)
Sodium: 134 mmol/L — ABNORMAL LOW (ref 135–145)
Total Bilirubin: 0.6 mg/dL (ref 0.3–1.2)
Total Protein: 7.1 g/dL (ref 6.5–8.1)

## 2020-06-26 LAB — CBC WITH DIFFERENTIAL/PLATELET
Abs Immature Granulocytes: 0.03 10*3/uL (ref 0.00–0.07)
Basophils Absolute: 0.1 10*3/uL (ref 0.0–0.1)
Basophils Relative: 1 %
Eosinophils Absolute: 0.3 10*3/uL (ref 0.0–0.5)
Eosinophils Relative: 5 %
HCT: 39.1 % (ref 39.0–52.0)
Hemoglobin: 12.5 g/dL — ABNORMAL LOW (ref 13.0–17.0)
Immature Granulocytes: 1 %
Lymphocytes Relative: 7 %
Lymphs Abs: 0.5 10*3/uL — ABNORMAL LOW (ref 0.7–4.0)
MCH: 30.5 pg (ref 26.0–34.0)
MCHC: 32 g/dL (ref 30.0–36.0)
MCV: 95.4 fL (ref 80.0–100.0)
Monocytes Absolute: 0.5 10*3/uL (ref 0.1–1.0)
Monocytes Relative: 7 %
Neutro Abs: 5.2 10*3/uL (ref 1.7–7.7)
Neutrophils Relative %: 79 %
Platelets: 130 10*3/uL — ABNORMAL LOW (ref 150–400)
RBC: 4.1 MIL/uL — ABNORMAL LOW (ref 4.22–5.81)
RDW: 15.3 % (ref 11.5–15.5)
WBC: 6.5 10*3/uL (ref 4.0–10.5)
nRBC: 0 % (ref 0.0–0.2)

## 2020-06-26 LAB — TSH: TSH: 22.9 u[IU]/mL — ABNORMAL HIGH (ref 0.350–4.500)

## 2020-06-26 MED ORDER — SODIUM CHLORIDE 0.9% FLUSH
10.0000 mL | INTRAVENOUS | Status: DC | PRN
  Administered 2020-06-26 (×2): 10 mL

## 2020-06-26 MED ORDER — DURVALUMAB 500 MG/10ML IV SOLN
10.0000 mg/kg | Freq: Once | INTRAVENOUS | Status: AC
  Administered 2020-06-26: 740 mg via INTRAVENOUS
  Filled 2020-06-26: qty 10

## 2020-06-26 MED ORDER — HEPARIN SOD (PORK) LOCK FLUSH 100 UNIT/ML IV SOLN
500.0000 [IU] | Freq: Once | INTRAVENOUS | Status: AC | PRN
  Administered 2020-06-26: 500 [IU]

## 2020-06-26 MED ORDER — SODIUM CHLORIDE 0.9 % IV SOLN: Freq: Once | INTRAVENOUS | Status: AC

## 2020-06-26 NOTE — Patient Instructions (Signed)
[facility]  Discharge Instructions: Thank you for choosing [facility] to provide your oncology and hematology care.  If you have a lab appointment with the [facility], please come in thru the Main Entrance and check in at the main information desk.  Wear comfortable clothing and clothing appropriate for easy access to any Portacath or PICC line.   We strive to give you quality time with your provider. You may need to reschedule your appointment if you arrive late (15 or more minutes).  Arriving late affects you and other patients whose appointments are after yours.  Also, if you miss three or more appointments without notifying the office, you may be dismissed from the clinic at the provider's discretion.      For prescription refill requests, have your pharmacy contact our office and allow 72 hours for refills to be completed.    Today you received the following chemotherapy and/or immunotherapy agents imfinzi.       To help prevent nausea and vomiting after your treatment, we encourage you to take your nausea medication as directed.  BELOW ARE SYMPTOMS THAT SHOULD BE REPORTED IMMEDIATELY: . *FEVER GREATER THAN 100.4 F (38 C) OR HIGHER . *CHILLS OR SWEATING . *NAUSEA AND VOMITING THAT IS NOT CONTROLLED WITH YOUR NAUSEA MEDICATION . *UNUSUAL SHORTNESS OF BREATH . *UNUSUAL BRUISING OR BLEEDING . *URINARY PROBLEMS (pain or burning when urinating, or frequent urination) . *BOWEL PROBLEMS (unusual diarrhea, constipation, pain near the anus) . TENDERNESS IN MOUTH AND THROAT WITH OR WITHOUT PRESENCE OF ULCERS (sore throat, sores in mouth, or a toothache) . UNUSUAL RASH, SWELLING OR PAIN  . UNUSUAL VAGINAL DISCHARGE OR ITCHING   Items with * indicate a potential emergency and should be followed up as soon as possible or go to the Emergency Department if any problems should occur.  Please show the CHEMOTHERAPY ALERT CARD or IMMUNOTHERAPY ALERT CARD at check-in to  the Emergency Department and triage nurse.  Should you have questions after your visit or need to cancel or reschedule your appointment, please contact [facility] [phone]  and follow the prompts.  Office hours are 8:00 a.m. to 4:30 p.m. Monday - Friday. Please note that voicemails left after 4:00 p.m. may not be returned until the following business day.  We are closed weekends and major holidays. You have access to a nurse at all times for urgent questions. Please call the main number to the clinic [phone] and follow the prompts.  For any non-urgent questions, you may also contact your provider using MyChart. We now offer e-Visits for anyone 52 and older to request care online for non-urgent symptoms. For details visit [email or web address].   Also download the MyChart app! Go to the app store, search "MyChart", open the app, select [facility], and log in with your MyChart username and password.  Due to Covid, a mask is required upon entering the hospital/clinic. If you do not have a mask, one will be given to you upon arrival. For doctor visits, patients may have 1 support person aged 85 or older with them. For treatment visits, patients cannot have anyone with them due to current Covid guidelines and our immunocompromised population.

## 2020-06-26 NOTE — Progress Notes (Signed)
Patient tolerated therapy with no complaints voiced.  Side effects with management reviewed with understanding verbalized.  Port site clean and dry with no bruising or swelling noted at site.  Good blood return noted before and after administration of therapy.  Band aid applied.  Patient left in satisfactory condition with VSS and no s/s of distress noted.

## 2020-06-27 LAB — T4: T4, Total: 3.4 ug/dL — ABNORMAL LOW (ref 4.5–12.0)

## 2020-06-29 ENCOUNTER — Other Ambulatory Visit: Payer: Self-pay | Admitting: Cardiology

## 2020-06-29 ENCOUNTER — Other Ambulatory Visit (HOSPITAL_COMMUNITY): Payer: Self-pay | Admitting: Hematology

## 2020-06-29 DIAGNOSIS — C3492 Malignant neoplasm of unspecified part of left bronchus or lung: Secondary | ICD-10-CM

## 2020-06-29 NOTE — Telephone Encounter (Signed)
Prescription refill request for Eliquis received. Indication: afib  Last office visit: [place], 05/09/2020 Scr: 1.13, 06/26/2020 Age: 70 yo   Weight: 75.8 kg   Pt is on the correct dose of Eliquis. Prescription refill sent for Eliquis 5mg  BID.

## 2020-07-09 NOTE — Progress Notes (Signed)
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 [place])   11/08/2019 Cancer Staging   Staging form: Lung, AJCC 8th Edition - Clinical stage from 11/08/2019: Stage IIIB (cT4, cN2, cM0) - Signed by Derek Jack, MD on 11/08/2019   11/28/2019 - 01/02/2020 Chemotherapy   The patient had palonosetron (ALOXI) injection 0.25 mg, 0.25 mg, Intravenous,  Once, 6 of 6 cycles Administration: 0.25 mg (11/28/2019), 0.25 mg (12/05/2019), 0.25 mg (12/12/2019), 0.25 mg (12/19/2019), 0.25 mg (12/26/2019), 0.25 mg (01/02/2020) CARBOplatin (PARAPLATIN) 180 mg in sodium chloride 0.9 % 250 mL chemo infusion, 180 mg (100 % of original dose 176 mg), Intravenous,  Once, 6 of 6 cycles Dose modification:   (original dose 176 mg, Cycle 1),   (original dose 201.2 mg, Cycle 2),   (original dose 179.2 mg, Cycle 3),   (original dose 179.2 mg, Cycle 4),   (original dose 195.4 mg, Cycle 5),   (original dose 178.2 mg, Cycle 6) Administration: 180 mg (11/28/2019), 200 mg (12/05/2019), 180 mg (12/12/2019), 180 mg (12/19/2019), 190 mg (12/26/2019), 180 mg (01/02/2020) PACLitaxel (TAXOL) 84 mg in sodium chloride 0.9 % 250 mL chemo infusion (</= 80mg /m2), 45 mg/m2 = 84 mg, Intravenous,  Once, 6 of 6 cycles Administration: 84 mg (11/28/2019), 84 mg (12/05/2019), 84 mg (12/12/2019), 84 mg (12/19/2019), 84 mg (12/26/2019), 84 mg (01/02/2020)  for chemotherapy treatment.    01/30/2020  -  Chemotherapy    Patient is on Treatment Plan: LUNG DURVALUMAB Q14D        CANCER STAGING: Cancer Staging Squamous cell lung cancer, left ([place]) Staging form: Lung, AJCC 8th Edition - Clinical stage from 11/08/2019: Stage IIIB (cT4, cN2, cM0) - Signed by Derek Jack, MD on 11/08/2019   INTERVAL HISTORY:  [name], a 70 y.o. male, returns for routine follow-up and consideration for next cycle of chemotherapy. Odus was last seen on 06/11/2020.  Due for cycle #11 of Durvalumab today.   Overall, he tells me he has been feeling pretty well. He reports improvement with his cough; it produces white sputum. He is taking Marinol and Eliquis twice daily; he reports improved appetite and denies any bleeding. He denies diarrhea, rash, or itching. He reports slight trouble chewing as it causes fatigue.  Overall, he feels ready for next cycle of chemo today.   REVIEW OF SYSTEMS:  Review of Systems  Constitutional: Positive for appetite change (75%) and fatigue (50%).  HENT:   Positive for trouble swallowing.   Respiratory: Positive for cough.   Gastrointestinal: Negative for diarrhea.  Skin: Negative for itching and rash.  All other systems reviewed and are negative.   PAST MEDICAL/SURGICAL HISTORY:  Past Medical History:  Diagnosis Date  . Arthritis   . Hyperlipemia   . Hypertension   . Hyperthyroidism   . Lung cancer ([facility])   . Myocardial infarction ([facility])   . Paroxysmal atrial fibrillation ([facility])   . Port-A-Cath in place 11/14/2019  . Tachycardia  Past Surgical History:  Procedure Laterality Date  . CARDIAC CATHETERIZATION    . PORTACATH PLACEMENT Right 11/01/2019   Procedure: INSERTION PORT-A-CATH;  Surgeon: Aviva Signs, MD;  Location: [facility];  Service: General;  Laterality: Right;    SOCIAL HISTORY:  Social History   Socioeconomic History  . Marital status: Married    Spouse name: Not on file  . Number of children: 1  . Years of education: Not on  file  . Highest education level: Not on file  Occupational History  . Occupation: retired  . Occupation: Glass blower/designer: [employer]  Tobacco Use  . Smoking status: Former Smoker    Years: 45.00    Types: 25, Cigarettes    Quit date: 10/19/2014    Years since quitting: 5.7  . Smokeless tobacco: Never Used  Vaping Use  . Vaping Use: Never used  Substance and Sexual Activity  . Alcohol use: Never  . Drug use: No  . Sexual activity: Not on file  Other Topics Concern  . Not on file  Social History Narrative  . Not on file   Social Determinants of Health   Financial Resource Strain: Medium Risk  . Difficulty of Paying Living Expenses: Somewhat hard  Food Insecurity: No Food Insecurity  . Worried About Charity fundraiser in the Last Year: Never true  . Ran Out of Food in the Last Year: Never true  Transportation Needs: No Transportation Needs  . Lack of Transportation (Medical): No  . Lack of Transportation (Non-Medical): No  Physical Activity: Inactive  . Days of Exercise per Week: 0 days  . Minutes of Exercise per Session: 0 min  Stress: Stress Concern Present  . Feeling of Stress : Very much  Social Connections: Moderately Isolated  . Frequency of Communication with Friends and Family: More than three times a week  . Frequency of Social Gatherings with Friends and Family: More than three times a week  . Attends Religious Services: Never  . Active Member of Clubs or Organizations: No  . Attends Archivist Meetings: Never  . Marital Status: Married  Human resources officer Violence: Not At Risk  . Fear of Current or Ex-Partner: No  . Emotionally Abused: No  . Physically Abused: No  . Sexually Abused: No    FAMILY HISTORY:  Family History  Problem Relation Age of Onset  . Hypertension Father   . Dementia Father        died age 67  . Heart disease Father   . Heart failure Other     CURRENT MEDICATIONS:  Current Outpatient Medications  Medication Sig  Dispense Refill  . acetaminophen (TYLENOL) 500 MG tablet Take 500 mg by mouth every 6 (six) hours as needed for moderate pain or headache.    . albuterol (PROVENTIL) (2.5 MG/3ML) 0.083% nebulizer solution Take 3 mLs (2.5 mg total) by nebulization every 6 (six) hours as needed for wheezing or shortness of breath. 75 mL 12  . ALPRAZolam (XANAX) 0.25 MG tablet TAKE 1 TABLET BY MOUTH THREE TIMES DAILY AS NEEDED FOR ANXIETY 30 tablet 3  . dronabinol (MARINOL) 5 MG capsule Take 1 capsule (5 mg total) by mouth 2 (two) times daily before a meal. 60 capsule 2  . ELIQUIS 5 MG TABS tablet Take 1 tablet by mouth twice daily 60 tablet 5  . HYCODAN 5-1.5 MG/5ML syrup take 15 mls BY MOUTH EVERY 8 HOURS AS NEEDED FOR cough 473 mL 0  . lidocaine (  XYLOCAINE) 2 % solution TAKE 15 ML BY MOUTH 4 TIMES DAILY WITH  MEALS 480 mL 0  . lidocaine-prilocaine (EMLA) cream Apply 1 application topically as needed.    . metoprolol tartrate (LOPRESSOR) 25 MG tablet Take one tablet(25 mg )  in the AM and take 1/2 Tablet (12.5mg ) in the PM 135 tablet 3   No current facility-administered medications for this visit.    ALLERGIES:  No Known Allergies  PHYSICAL EXAM:  Performance status (ECOG): 1 - Symptomatic but completely ambulatory  There were no vitals filed for this visit. Wt Readings from Last 3 Encounters:  06/26/20 167 lb (75.8 kg)  06/11/20 167 lb 1.6 oz (75.8 kg)  05/28/20 161 lb 9.6 oz (73.3 kg)   Physical Exam Vitals reviewed.  Constitutional:      Appearance: Normal appearance.  Cardiovascular:     Rate and Rhythm: Normal rate and regular rhythm.     Pulses: Normal pulses.     Heart sounds: Normal heart sounds.  Pulmonary:     Effort: Pulmonary effort is normal.     Breath sounds: Normal breath sounds.  Abdominal:     Palpations: Abdomen is soft. There is no hepatomegaly, splenomegaly or mass.     Tenderness: There is no abdominal tenderness.  Neurological:     General: No focal deficit present.      Mental Status: He is alert and oriented to person, place, and time.  Psychiatric:        Mood and Affect: Mood normal.        Behavior: Behavior normal.     LABORATORY DATA:  I have reviewed the labs as listed.  CBC Latest Ref Rng & Units 06/26/2020 06/11/2020 05/28/2020  WBC 4.0 - 10.5 K/uL 6.5 6.8 6.7  Hemoglobin 13.0 - 17.0 g/dL 12.5(L) 12.0(L) 11.9(L)  Hematocrit 39.0 - 52.0 % 39.1 37.5(L) 37.2(L)  Platelets 150 - 400 K/uL 130(L) 149(L) 119(L)   CMP Latest Ref Rng & Units 06/26/2020 06/11/2020 05/28/2020  Glucose 70 - 99 mg/dL 104(H) 115(H) 121(H)  BUN 8 - 23 mg/dL 17 11 14   Creatinine 0.61 - 1.24 mg/dL 1.13 1.16 1.17  Sodium 135 - 145 mmol/L 134(L) 137 138  Potassium 3.5 - 5.1 mmol/L 4.3 3.8 3.7  Chloride 98 - 111 mmol/L 101 101 104  CO2 22 - 32 mmol/L 26 27 26   Calcium 8.9 - 10.3 mg/dL 8.9 9.0 8.7(L)  Total Protein 6.5 - 8.1 g/dL 7.1 6.7 6.5  Total Bilirubin 0.3 - 1.2 mg/dL 0.6 0.5 0.4  Alkaline Phos 38 - 126 U/L 67 62 63  AST 15 - 41 U/L 31 25 26   ALT 0 - 44 U/L 25 21 23     DIAGNOSTIC IMAGING:  I have independently reviewed the scans and discussed with the patient. No results found.   ASSESSMENT:  1. Left lung cancer: -Presentation to the ER on 10/06/2019 with cough and dizziness. -CT chest with contrast on 10/06/2019 showed large left suprahilar mass completely obstructing left upper lobe bronchus, measuring 7 x 5.3 x 5.8 cm. Mass extends into AP window and narrows the left mainstem bronchus. Small subpleural nodule in the left lower lobe measuring 4 mm indeterminate. AP window nodal mass measuring 3.4 cm. No supraclavicular adenopathy. Subcarinal enlarged lymph node measures 1.4 cm. -PET scan on 10/14/2019 shows large hypermetabolic left upper lobe mass surrounding the left hilum, extending into pleural surface not invading the chest wall. Direct invasion into the AP window and metastatic subcarinal lymph node, T4  N2 M0. 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 -10 pound weight loss in the last 6 months. Hoarseness for last 1 week. -MRI of the brain on 10/28/2019 with no evidence of metastasis. -Concurrent chemoradiation therapy started on 11/28/2019.Completed 01/02/20 ( chemo) 01/06/2020(radiation) -CT chest on 01/20/2020 showed significant interval decrease in consolidation of the left upper lobe, interval decrease in irregular nodularity and consolidation of the left lower lobe. Unchanged posttreatment appearance of soft tissue about the left hilum and AP window. No discretely enlarged mediastinal or hilar lymph nodes. Trace left pleural effusion, decreased compared to prior exam. -Consolidation durvalumab started on 01/30/2020. -CT chest with contrast on 03/30/2020 with the development of small left-sided pleural effusion with further volume loss. Bandlike scarring in the left lower lobe since prior study likely posttreatment changes. Small bone lesion at T1 stable. 1 small nodule in the right chest which is new about 4 mm. Will monitor.  2. Social/family history: -He smoked 1 pack/day for 45 years, quit 5 years ago. -No family history of malignancies.   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 Benjamine Mola for hoarseness.  He will have a procedure done. - He does not report any immunotherapy related side effects. - Reviewed his labs which showed normal LFTs.  CBC was grossly normal.  Creatinine was 1.25 and slightly elevated.  Encouraged hydration. - Recommend proceeding with treatment today. - RTC 2 weeks for follow-up.  We will plan to repeat CT scan of the chest without contrast.  2.Cough: -He is not requiring Hycodan on a regular basis.  Cough has improved.  3. Atrial fibrillation: -Continue Eliquis 5 mg twice daily.  No bleeding issues reported.  4.Weight loss: -Continue Marinol 5 mg twice daily. - He is continuing  to eat well and gaining weight.  5. Hypertension: -Continue metoprolol 1 tablet in the morning and half tablet in the evening.  6.  Hypothyroidism: -Last TSH was 22.9.  T4 was low at 3.4. - We will start him on Synthroid 100 mcg daily.  We will recheck his TSH in 6 weeks.      Orders placed this encounter:  No orders of the defined types were placed in this encounter.    Derek Jack, MD [place] [phone]   I, Thana Ates, am acting as a scribe for Dr. Derek Jack.  I, Derek Jack MD, have reviewed the above documentation for accuracy and completeness, and I agree with the above.

## 2020-07-10 ENCOUNTER — Other Ambulatory Visit: Payer: Self-pay

## 2020-07-10 ENCOUNTER — Inpatient Hospital Stay (HOSPITAL_BASED_OUTPATIENT_CLINIC_OR_DEPARTMENT_OTHER): Payer: Medicare Other | Admitting: Hematology

## 2020-07-10 ENCOUNTER — Inpatient Hospital Stay (HOSPITAL_COMMUNITY): Payer: Medicare Other

## 2020-07-10 VITALS — BP 108/66 | HR 93 | Temp 96.8°F | Resp 18 | Wt 165.5 lb

## 2020-07-10 VITALS — BP 142/78 | HR 85 | Temp 96.6°F | Resp 18

## 2020-07-10 DIAGNOSIS — C3492 Malignant neoplasm of unspecified part of left bronchus or lung: Secondary | ICD-10-CM

## 2020-07-10 DIAGNOSIS — Z95828 Presence of other vascular implants and grafts: Secondary | ICD-10-CM

## 2020-07-10 DIAGNOSIS — Z5112 Encounter for antineoplastic immunotherapy: Secondary | ICD-10-CM | POA: Diagnosis not present

## 2020-07-10 DIAGNOSIS — E222 Syndrome of inappropriate secretion of antidiuretic hormone: Secondary | ICD-10-CM

## 2020-07-10 LAB — MAGNESIUM: Magnesium: 1.7 mg/dL (ref 1.7–2.4)

## 2020-07-10 LAB — COMPREHENSIVE METABOLIC PANEL
ALT: 33 U/L (ref 0–44)
AST: 38 U/L (ref 15–41)
Albumin: 3.7 g/dL (ref 3.5–5.0)
Alkaline Phosphatase: 62 U/L (ref 38–126)
Anion gap: 7 (ref 5–15)
BUN: 16 mg/dL (ref 8–23)
CO2: 26 mmol/L (ref 22–32)
Calcium: 8.9 mg/dL (ref 8.9–10.3)
Chloride: 103 mmol/L (ref 98–111)
Creatinine, Ser: 1.25 mg/dL — ABNORMAL HIGH (ref 0.61–1.24)
GFR, Estimated: >60 mL/min (ref >60)
Glucose, Bld: 96 mg/dL (ref 70–99)
Potassium: 3.8 mmol/L (ref 3.5–5.1)
Sodium: 136 mmol/L (ref 135–145)
Total Bilirubin: 0.7 mg/dL (ref 0.3–1.2)
Total Protein: 6.9 g/dL (ref 6.5–8.1)

## 2020-07-10 LAB — CBC WITH DIFFERENTIAL/PLATELET
Abs Immature Granulocytes: 0.01 10*3/uL (ref 0.00–0.07)
Basophils Absolute: 0.1 10*3/uL (ref 0.0–0.1)
Basophils Relative: 1 %
Eosinophils Absolute: 0.3 10*3/uL (ref 0.0–0.5)
Eosinophils Relative: 5 %
HCT: 38.8 % — ABNORMAL LOW (ref 39.0–52.0)
Hemoglobin: 12.5 g/dL — ABNORMAL LOW (ref 13.0–17.0)
Immature Granulocytes: 0 %
Lymphocytes Relative: 8 %
Lymphs Abs: 0.5 10*3/uL — ABNORMAL LOW (ref 0.7–4.0)
MCH: 30.8 pg (ref 26.0–34.0)
MCHC: 32.2 g/dL (ref 30.0–36.0)
MCV: 95.6 fL (ref 80.0–100.0)
Monocytes Absolute: 0.4 10*3/uL (ref 0.1–1.0)
Monocytes Relative: 7 %
Neutro Abs: 4.7 10*3/uL (ref 1.7–7.7)
Neutrophils Relative %: 79 %
Platelets: 127 10*3/uL — ABNORMAL LOW (ref 150–400)
RBC: 4.06 MIL/uL — ABNORMAL LOW (ref 4.22–5.81)
RDW: 14.9 % (ref 11.5–15.5)
WBC: 6 10*3/uL (ref 4.0–10.5)
nRBC: 0 % (ref 0.0–0.2)

## 2020-07-10 MED ORDER — SODIUM CHLORIDE 0.9 % IV SOLN: Freq: Once | INTRAVENOUS | Status: AC

## 2020-07-10 MED ORDER — SODIUM CHLORIDE 0.9% FLUSH
10.0000 mL | INTRAVENOUS | Status: DC | PRN
  Administered 2020-07-10: 10 mL

## 2020-07-10 MED ORDER — SODIUM CHLORIDE 0.9 % IV SOLN
10.0000 mg/kg | Freq: Once | INTRAVENOUS | Status: AC
  Administered 2020-07-10: 740 mg via INTRAVENOUS
  Filled 2020-07-10: qty 10

## 2020-07-10 MED ORDER — LEVOTHYROXINE SODIUM 100 MCG PO TABS: 100.0000 ug | ORAL_TABLET | Freq: Every day | ORAL | 3 refills | Status: DC

## 2020-07-10 MED ORDER — HEPARIN SOD (PORK) LOCK FLUSH 100 UNIT/ML IV SOLN
500.0000 [IU] | Freq: Once | INTRAVENOUS | Status: AC | PRN
  Administered 2020-07-10: 500 [IU]

## 2020-07-10 NOTE — Progress Notes (Signed)
Patients port flushed without difficulty.  Good blood return noted with no bruising or swelling noted at site.  Stable during access and blood draw.  Patient to remain accessed for treatment. 

## 2020-07-10 NOTE — Patient Instructions (Signed)
[facility] at [facility] Discharge Instructions  You were seen today by Dr. Delton Coombes. He went over your recent results. You will be scheduled for a CT scan of your chest prior to your next visit. Dr. Delton Coombes will see you back in 2 weeks for labs and follow up.   Thank you for choosing [place] at [facility] to provide your oncology and hematology care.  To afford each patient quality time with our provider, please arrive at least 15 minutes before your scheduled appointment time.   If you have a lab appointment with the [facility] please come in thru the Main Entrance and check in at the main information desk  You need to re-schedule your appointment should you arrive 10 or more minutes late.  We strive to give you quality time with our providers, and arriving late affects you and other patients whose appointments are after yours.  Also, if you no show three or more times for appointments you may be dismissed from the clinic at the providers discretion.     Again, thank you for choosing [facility].  Our hope is that these requests will decrease the amount of time that you wait before being seen by our physicians.       _____________________________________________________________  Should you have questions after your visit to [facility], please contact our office at [phone] between the hours of 8:00 a.m. and 4:30 p.m.  Voicemails left after 4:00 p.m. will not be returned until the following business day.  For prescription refill requests, have your pharmacy contact our office and allow 72 hours.    Cancer Center Support Programs:   > Cancer Support Group  2nd Tuesday of the month 1pm-2pm, Journey Room

## 2020-07-10 NOTE — Patient Instructions (Signed)
[facility]  Discharge Instructions: Thank you for choosing [facility] to provide your oncology and hematology care.  If you have a lab appointment with the [facility], please come in thru the Main Entrance and check in at the main information desk.  Wear comfortable clothing and clothing appropriate for easy access to any Portacath or PICC line.   We strive to give you quality time with your provider. You may need to reschedule your appointment if you arrive late (15 or more minutes).  Arriving late affects you and other patients whose appointments are after yours.  Also, if you miss three or more appointments without notifying the office, you may be dismissed from the clinic at the provider's discretion.      For prescription refill requests, have your pharmacy contact our office and allow 72 hours for refills to be completed.    Today you received the following chemotherapy and/or immunotherapy agents imfinzi.  Return as schedule.  Please call the clinic if you have any questions or concerns.     To help prevent nausea and vomiting after your treatment, we encourage you to take your nausea medication as directed.  BELOW ARE SYMPTOMS THAT SHOULD BE REPORTED IMMEDIATELY: . *FEVER GREATER THAN 100.4 F (38 C) OR HIGHER . *CHILLS OR SWEATING . *NAUSEA AND VOMITING THAT IS NOT CONTROLLED WITH YOUR NAUSEA MEDICATION . *UNUSUAL SHORTNESS OF BREATH . *UNUSUAL BRUISING OR BLEEDING . *URINARY PROBLEMS (pain or burning when urinating, or frequent urination) . *BOWEL PROBLEMS (unusual diarrhea, constipation, pain near the anus) . TENDERNESS IN MOUTH AND THROAT WITH OR WITHOUT PRESENCE OF ULCERS (sore throat, sores in mouth, or a toothache) . UNUSUAL RASH, SWELLING OR PAIN  . UNUSUAL VAGINAL DISCHARGE OR ITCHING   Items with * indicate a potential emergency and should be followed up as soon as possible or go to the Emergency Department if any problems should  occur.  Please show the CHEMOTHERAPY ALERT CARD or IMMUNOTHERAPY ALERT CARD at check-in to the Emergency Department and triage nurse.  Should you have questions after your visit or need to cancel or reschedule your appointment, please contact [facility] [phone]  and follow the prompts.  Office hours are 8:00 a.m. to 4:30 p.m. Monday - Friday. Please note that voicemails left after 4:00 p.m. may not be returned until the following business day.  We are closed weekends and major holidays. You have access to a nurse at all times for urgent questions. Please call the main number to the clinic [phone] and follow the prompts.  For any non-urgent questions, you may also contact your provider using MyChart. We now offer e-Visits for anyone 70 and older to request care online for non-urgent symptoms. For details visit [email or web address].   Also download the MyChart app! Go to the app store, search "MyChart", open the app, select [facility], and log in with your MyChart username and password.  Due to Covid, a mask is required upon entering the hospital/clinic. If you do not have a mask, one will be given to you upon arrival. For doctor visits, patients may have 1 support person aged 51 or older with them. For treatment visits, patients cannot have anyone with them due to current Covid guidelines and our immunocompromised population.

## 2020-07-10 NOTE — Progress Notes (Signed)
Pt here for imfinzi. Okay for labs per Dr Raliegh Ip.   Pt tolerated treatment well today without incidence.  Pt stable during and after treatment.  Vital signs stable prior to discharge. AVS reviewed.  Discharged in stable condition ambulatory.

## 2020-07-10 NOTE — Progress Notes (Signed)
Patient was assessed by Dr. Delton Coombes and labs have been reviewed.  Patient is okay to proceed with treatment today. Primary RN and pharmacy aware. Started on synthroid 146mcg daily.

## 2020-07-18 ENCOUNTER — Other Ambulatory Visit (HOSPITAL_COMMUNITY): Payer: Self-pay | Admitting: Hematology

## 2020-07-20 ENCOUNTER — Other Ambulatory Visit: Payer: Self-pay

## 2020-07-20 ENCOUNTER — Ambulatory Visit (HOSPITAL_COMMUNITY)
Admission: RE | Admit: 2020-07-20 | Discharge: 2020-07-20 | Disposition: A | Discharge status: Home / Self Care | Payer: Medicare Other | Source: Ambulatory Visit | Attending: Hematology | Admitting: Hematology

## 2020-07-20 DIAGNOSIS — C3492 Malignant neoplasm of unspecified part of left bronchus or lung: Secondary | ICD-10-CM | POA: Diagnosis not present

## 2020-07-23 ENCOUNTER — Encounter (HOSPITAL_COMMUNITY): Payer: Self-pay | Admitting: *Deleted

## 2020-07-23 ENCOUNTER — Other Ambulatory Visit (HOSPITAL_COMMUNITY): Payer: Self-pay | Admitting: Hematology

## 2020-07-23 DIAGNOSIS — C3492 Malignant neoplasm of unspecified part of left bronchus or lung: Secondary | ICD-10-CM

## 2020-07-23 NOTE — Progress Notes (Signed)
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  PRIOR THERAPY: Concurrent chemoradiation with weekly carboplatin and paclitaxel x 6 cycles from 11/28/2019 to 01/02/2020  NGS Results: not done  CURRENT THERAPY: Durvalumab every 2 weeks  BRIEF ONCOLOGIC HISTORY:  Oncology History  Squamous cell lung cancer, left (Alan [name])  11/08/2019 Initial Diagnosis   Squamous cell lung cancer, left (Alan [name])   11/08/2019 Cancer Staging   Staging form: Lung, AJCC 8th Edition - Clinical stage from 11/08/2019: Stage IIIB (cT4, cN2, cM0) - Signed by Alan Jack, MD on 11/08/2019   11/28/2019 - 01/02/2020 Chemotherapy   The patient had palonosetron (ALOXI) injection 0.25 mg, 0.25 mg, Intravenous,  Once, 6 of 6 cycles Administration: 0.25 mg (11/28/2019), 0.25 mg (12/05/2019), 0.25 mg (12/12/2019), 0.25 mg (12/19/2019), 0.25 mg (12/26/2019), 0.25 mg (01/02/2020) CARBOplatin (PARAPLATIN) 180 mg in sodium chloride 0.9 % 250 mL chemo infusion, 180 mg (100 % of original dose 176 mg), Intravenous,  Once, 6 of 6 cycles Dose modification:   (original dose 176 mg, Cycle 1),   (original dose 201.2 mg, Cycle 2),   (original dose 179.2 mg, Cycle 3),   (original dose 179.2 mg, Cycle 4),   (original dose 195.4 mg, Cycle 5),   (original dose 178.2 mg, Cycle 6) Administration: 180 mg (11/28/2019), 200 mg (12/05/2019), 180 mg (12/12/2019), 180 mg (12/19/2019), 190 mg (12/26/2019), 180 mg (01/02/2020) PACLitaxel (TAXOL) 84 mg in sodium chloride 0.9 % 250 mL chemo infusion (</= 80mg /m2), 45 mg/m2 = 84 mg, Intravenous,  Once, 6 of 6 cycles Administration: 84 mg (11/28/2019), 84 mg (12/05/2019), 84 mg (12/12/2019), 84 mg (12/19/2019), 84 mg (12/26/2019), 84 mg (01/02/2020)  for chemotherapy treatment.    01/30/2020  -  Chemotherapy    Patient is on Treatment Plan: LUNG DURVALUMAB Q14D        CANCER STAGING: Cancer Staging Squamous cell lung cancer, left (Alan [name]) Staging form: Lung, AJCC 8th Edition - Clinical stage from 11/08/2019: Stage IIIB (cT4, cN2, cM0) - Signed by Alan Jack, MD on 11/08/2019   INTERVAL HISTORY:  Alan [name], a 70 y.o. male, returns for routine follow-up and consideration for next cycle of chemotherapy. Alan [name] was last seen on 07/10/2020.  Due for cycle #12 of Durvalumab today.   On exam today Alan [name] reports he has been well in the interim since his last visit.  He is accompanied by his wife today.  He reports that he has been tolerating the durvalumab well with only fatigue.  He denies having issues with nausea, vomiting, diarrhea, fevers, chills, sweats.  He notes that his cough is stable and continues to produce a whitish phlegm.  His weight has increased to 169 pounds today up 4 pounds from his prior visit on 07/10/2020.  Overall, he feels ready for next cycle of chemo today.   REVIEW OF SYSTEMS:  Review of Systems  Constitutional: Positive for appetite change (75%) and fatigue (50%).  HENT:   Positive for trouble swallowing.   Respiratory: Positive for cough.   Gastrointestinal: Negative for diarrhea.  Skin: Negative for itching and rash.  All other systems reviewed and are negative.   PAST MEDICAL/SURGICAL HISTORY:  Past Medical History:  Diagnosis Date  . Arthritis   . Hyperlipemia   . Hypertension   .  Hyperthyroidism   . Lung cancer ([facility])   . Myocardial infarction ([facility])   . Paroxysmal atrial fibrillation ([facility])   . Port-A-Cath in place 11/14/2019  . Tachycardia    Past Surgical History:  Procedure Laterality Date  . CARDIAC CATHETERIZATION    . PORTACATH PLACEMENT Right 11/01/2019   Procedure: INSERTION PORT-A-CATH;  Surgeon: Alan Signs, MD;  Location: [facility];  Service: General;  Laterality: Right;    SOCIAL HISTORY:  Social  History   Socioeconomic History  . Marital status: Married    Spouse name: Not on file  . Number of children: 1  . Years of education: Not on file  . Highest education level: Not on file  Occupational History  . Occupation: retired  . Occupation: Glass blower/designer: [employer]  Tobacco Use  . Smoking status: Former Smoker    Years: 45.00    Types: 58, Cigarettes    Quit date: 10/19/2014    Years since quitting: 5.7  . Smokeless tobacco: Never Used  Vaping Use  . Vaping Use: Never used  Substance and Sexual Activity  . Alcohol use: Never  . Drug use: No  . Sexual activity: Not on file  Other Topics Concern  . Not on file  Social History Narrative  . Not on file   Social Determinants of Health   Financial Resource Strain: Medium Risk  . Difficulty of Paying Living Expenses: Somewhat hard  Food Insecurity: No Food Insecurity  . Worried About Charity fundraiser in the Last Year: Never true  . Ran Out of Food in the Last Year: Never true  Transportation Needs: No Transportation Needs  . Lack of Transportation (Medical): No  . Lack of Transportation (Non-Medical): No  Physical Activity: Inactive  . Days of Exercise per Week: 0 days  . Minutes of Exercise per Session: 0 min  Stress: Stress Concern Present  . Feeling of Stress : Very much  Social Connections: Moderately Isolated  . Frequency of Communication with Friends and Family: More than three times a week  . Frequency of Social Gatherings with Friends and Family: More than three times a week  . Attends Religious Services: Never  . Active Member of Clubs or Organizations: No  . Attends Archivist Meetings: Never  . Marital Status: Married  Human resources officer Violence: Not At Risk  . Fear of Current or Ex-Partner: No  . Emotionally Abused: No  . Physically Abused: No  . Sexually Abused: No    FAMILY HISTORY:  Family History  Problem Relation Age of Onset  . Hypertension Father   . Dementia Father         died age 22  . Heart disease Father   . Heart failure Other     CURRENT MEDICATIONS:  Current Outpatient Medications  Medication Sig Dispense Refill  . ALPRAZolam (XANAX) 0.25 MG tablet TAKE 1 TABLET BY MOUTH THREE TIMES DAILY AS NEEDED FOR ANXIETY 30 tablet 3  . dronabinol (MARINOL) 5 MG capsule Take 1 capsule (5 mg total) by mouth 2 (two) times daily before a meal. 60 capsule 2  . ELIQUIS 5 MG TABS tablet Take 1 tablet by mouth twice daily 60 tablet 5  . HYCODAN 5-1.5 MG/5ML syrup take 15 mls BY MOUTH EVERY 8 HOURS AS NEEDED FOR cough 473 mL 0  . levothyroxine (SYNTHROID) 100 MCG tablet Take 1 tablet (100 mcg total) by mouth daily before breakfast. 30 tablet 3  . lidocaine (XYLOCAINE) 2 %  solution TAKE 15 ML BY MOUTH  4 TIMES DAILY WITH MEALS 480 mL 0  . lidocaine-prilocaine (EMLA) cream Apply 1 application topically as needed.    . metoprolol tartrate (LOPRESSOR) 25 MG tablet Take one tablet(25 mg )  in the AM and take 1/2 Tablet (12.5mg ) in the PM 135 tablet 3  . acetaminophen (TYLENOL) 500 MG tablet Take 500 mg by mouth every 6 (six) hours as needed for moderate pain or headache. (Patient not taking: Reported on 07/24/2020)    . albuterol (PROVENTIL) (2.5 MG/3ML) 0.083% nebulizer solution Take 3 mLs (2.5 mg total) by nebulization every 6 (six) hours as needed for wheezing or shortness of breath. (Patient not taking: Reported on 07/24/2020) 75 mL 12   No current facility-administered medications for this visit.   Facility-Administered Medications Ordered in Other Visits  Medication Dose Route Frequency Provider Last Rate Last Admin  . 0.9 %  sodium chloride infusion   Intravenous Once Orson Slick, MD      . durvalumab (IMFINZI) 740 mg in sodium chloride 0.9 % 100 mL chemo infusion  10 mg/kg (Treatment Plan Recorded) Intravenous Once Ledell Peoples IV, MD      . heparin lock flush 100 unit/mL  500 Units Intracatheter Once PRN Ledell Peoples IV, MD      . sodium chloride flush  (NS) 0.9 % injection 10 mL  10 mL Intracatheter PRN Orson Slick, MD        ALLERGIES:  No Known Allergies  PHYSICAL EXAM:  Performance status (ECOG): 1 - Symptomatic but completely ambulatory  Vitals:   07/24/20 0810  BP: 124/84  Pulse: 86  Resp: 18  Temp: (!) 96.9 F (36.1 C)  SpO2: 100%   Wt Readings from Last 3 Encounters:  07/24/20 169 lb 12.8 oz (77 kg)  07/10/20 165 lb 8 oz (75.1 kg)  06/26/20 167 lb (75.8 kg)   Physical Exam Vitals reviewed.  Constitutional:      Appearance: Normal appearance.  Cardiovascular:     Rate and Rhythm: Normal rate and regular rhythm.     Pulses: Normal pulses.     Heart sounds: Normal heart sounds.  Pulmonary:     Effort: Pulmonary effort is normal.     Breath sounds: Normal breath sounds.  Abdominal:     Palpations: Abdomen is soft. There is no hepatomegaly, splenomegaly or mass.     Tenderness: There is no abdominal tenderness.  Neurological:     General: No focal deficit present.     Mental Status: He is alert and oriented to person, place, and time.  Psychiatric:        Mood and Affect: Mood normal.        Behavior: Behavior normal.     LABORATORY DATA:  I have reviewed the labs as listed.  CBC Latest Ref Rng & Units 07/24/2020 07/10/2020 06/26/2020  WBC 4.0 - 10.5 K/uL 6.2 6.0 6.5  Hemoglobin 13.0 - 17.0 g/dL 11.7(L) 12.5(L) 12.5(L)  Hematocrit 39.0 - 52.0 % 36.3(L) 38.8(L) 39.1  Platelets 150 - 400 K/uL 113(L) 127(L) 130(L)   CMP Latest Ref Rng & Units 07/24/2020 07/10/2020 06/26/2020  Glucose 70 - 99 mg/dL 124(H) 96 104(H)  BUN 8 - 23 mg/dL 14 16 17   Creatinine 0.61 - 1.24 mg/dL 1.04 1.25(H) 1.13  Sodium 135 - 145 mmol/L 136 136 134(L)  Potassium 3.5 - 5.1 mmol/L 4.0 3.8 4.3  Chloride 98 - 111 mmol/L 104 103 101  CO2  22 - 32 mmol/L 25 26 26   Calcium 8.9 - 10.3 mg/dL 8.8(L) 8.9 8.9  Total Protein 6.5 - 8.1 g/dL 6.6 6.9 7.1  Total Bilirubin 0.3 - 1.2 mg/dL 0.4 0.7 0.6  Alkaline Phos 38 - 126 U/L 63 62 67  AST 15  - 41 U/L 27 38 31  ALT 0 - 44 U/L 22 33 25    DIAGNOSTIC IMAGING:  I have independently reviewed the scans and discussed with the patient. CT Chest Wo Contrast  Result Date: 07/21/2020 CLINICAL DATA:  LEFT lung cancer. Squamous cell carcinoma. Status post chemo and radiation therapy. EXAM: CT CHEST WITHOUT CONTRAST TECHNIQUE: Multidetector CT imaging of the chest was performed following the standard protocol without IV contrast. COMPARISON:  04/27/2020 FINDINGS: Cardiovascular: Coronary artery calcification and aortic atherosclerotic calcification. Mediastinum/Nodes: Fullness in the AP window and poor definition of fat planes around the LEFT mainstem bronchus and LEFT hilar not changed from comparison exam. No enlarged mediastinal or supraclavicular nodes. Port in the anterior chest wall with tip in distal SVC. Lungs/Pleura: Rounded consolidation with air bronchograms about the LEFT hilum not changed from comparison exam. New pneumothorax over the LEFT lung apex. Previously fluid filling the space. Favor chronic pneumothorax related to resorption of fluid and poor expansion lung. Discrete nodule within the LEFT lower lobe measures 7 mm (59/series 4 )which was not clearly evident on comparison exam. Atelectasis and small effusion on the LEFT base is similar. RIGHT lung is clear. Upper Abdomen: Limited view of the liver, kidneys, pancreas are unremarkable. Normal adrenal glands. Musculoskeletal: Stable lytic lesion at T1 measures 10 mm compared to 10 mm (image 115/series 6/coronal imaging IMPRESSION: 1. New pulmonary nodule in the LEFT lower lobe. Recommend short interval CT or PET-CT follow-up (1 month). 2. New chronic appearing pneumothorax at the LEFT lung apex related to the atelectatic LEFT upper lobe. Previously there is fluid within this space. Pneumothorax presumably related to resorption of fluid and poor expansion of the atelectatic lung. 3. Stable LEFT perihilar radiation consolidation. 4. Stable  LEFT peribronchial and AP window soft tissue thickening. These results will be called to the ordering clinician or representative by the Radiologist Assistant, and communication documented in the PACS or [employer]. Electronically Signed   By: Suzy Bouchard M.D.   On: 07/21/2020 13:28     ASSESSMENT:  1. Left lung cancer: -Presentation to the ER on 10/06/2019 with cough and dizziness. -CT chest with contrast on 10/06/2019 showed large left suprahilar mass completely obstructing left upper lobe bronchus, measuring 7 x 5.3 x 5.8 cm. Mass extends into AP window and narrows the left mainstem bronchus. Small subpleural nodule in the left lower lobe measuring 4 mm indeterminate. AP window nodal mass measuring 3.4 cm. No supraclavicular adenopathy. Subcarinal enlarged lymph node measures 1.4 cm. -PET scan on 10/14/2019 shows large hypermetabolic left upper lobe mass surrounding the left hilum, extending into pleural surface not invading the chest wall. Direct invasion into the AP window and metastatic subcarinal lymph node, T4 N2 M0. 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 -10 pound weight loss in the last 6 months. Hoarseness for last 1 week. -MRI of the brain on 10/28/2019 with no evidence of metastasis. -Concurrent chemoradiation therapy started on 11/28/2019.Completed 01/02/20 ( chemo) 01/06/2020(radiation) -CT chest on 01/20/2020 showed significant interval decrease in consolidation of the left upper lobe, interval decrease in irregular nodularity and consolidation of the left lower lobe. Unchanged posttreatment appearance of  soft tissue about the left hilum and AP window. No discretely enlarged mediastinal or hilar lymph nodes. Trace left pleural effusion, decreased compared to prior exam. -Consolidation durvalumab started on 01/30/2020. -CT chest with contrast on 03/30/2020 with the development of small left-sided pleural  effusion with further volume loss. Bandlike scarring in the left lower lobe since prior study likely posttreatment changes. Small bone lesion at T1 stable. 1 small nodule in the right chest which is new about 4 mm. Will monitor.  2. Social/family history: -He smoked 1 pack/day for 45 years, quit 5 years ago. -No family history of malignancies.   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 Benjamine Mola for hoarseness.  He will have a procedure done. - He does not report any immunotherapy related side effects. - Reviewed his labs which showed normal LFTs.  CBC was grossly normal.  Creatinine was 1.04 and slightly elevated.  Encouraged hydration. -- CT scan on 07/20/2020 notes a new 43mm LLL nodule, no previously present. Short term repeat imaging recommended per radiology.  - Recommend proceeding with treatment today. - RTC 2 weeks for follow-up.    2.Cough: -He is not requiring Hycodan on a regular basis.  Cough has improved.  3. Atrial fibrillation: -Continue Eliquis 5 mg twice daily.  No bleeding issues reported.  4.Weight loss: -Continue Marinol 5 mg twice daily. - He is continuing to eat well and gaining weight.  5. Hypertension: -Continue metoprolol 1 tablet in the morning and half tablet in the evening.  6.  Hypothyroidism: -Last TSH was 22.9.  T4 was low at 3.4. on 06/26/2020.  - We will start him on Synthroid 100 mcg daily.  We will recheck his TSH in 6 weeks.    Orders placed this encounter:  No orders of the defined types were placed in this encounter.   Ledell Peoples, MD Department of Hematology/Oncology [place] Phone: [phone] Pager: [phone] Email: [email or web address]

## 2020-07-24 ENCOUNTER — Inpatient Hospital Stay (HOSPITAL_COMMUNITY): Payer: Medicare Other

## 2020-07-24 ENCOUNTER — Ambulatory Visit (HOSPITAL_COMMUNITY): Payer: Medicare Other

## 2020-07-24 ENCOUNTER — Encounter (HOSPITAL_COMMUNITY): Payer: Self-pay | Admitting: Hematology

## 2020-07-24 ENCOUNTER — Other Ambulatory Visit (HOSPITAL_COMMUNITY): Payer: Medicare Other

## 2020-07-24 ENCOUNTER — Encounter (HOSPITAL_COMMUNITY): Payer: Self-pay | Admitting: Hematology and Oncology

## 2020-07-24 ENCOUNTER — Other Ambulatory Visit: Payer: Self-pay

## 2020-07-24 ENCOUNTER — Inpatient Hospital Stay (HOSPITAL_COMMUNITY): Payer: Medicare Other | Attending: Hematology and Oncology | Admitting: Hematology and Oncology

## 2020-07-24 VITALS — BP 124/84 | HR 86 | Temp 96.9°F | Resp 18 | Wt 169.8 lb

## 2020-07-24 VITALS — BP 150/82 | HR 76 | Temp 96.5°F | Resp 18

## 2020-07-24 DIAGNOSIS — I4891 Unspecified atrial fibrillation: Secondary | ICD-10-CM | POA: Diagnosis not present

## 2020-07-24 DIAGNOSIS — Z95828 Presence of other vascular implants and grafts: Secondary | ICD-10-CM

## 2020-07-24 DIAGNOSIS — C3492 Malignant neoplasm of unspecified part of left bronchus or lung: Secondary | ICD-10-CM

## 2020-07-24 DIAGNOSIS — Z5112 Encounter for antineoplastic immunotherapy: Secondary | ICD-10-CM | POA: Insufficient documentation

## 2020-07-24 DIAGNOSIS — I10 Essential (primary) hypertension: Secondary | ICD-10-CM | POA: Insufficient documentation

## 2020-07-24 DIAGNOSIS — R059 Cough, unspecified: Secondary | ICD-10-CM | POA: Insufficient documentation

## 2020-07-24 DIAGNOSIS — Z7901 Long term (current) use of anticoagulants: Secondary | ICD-10-CM | POA: Insufficient documentation

## 2020-07-24 DIAGNOSIS — R634 Abnormal weight loss: Secondary | ICD-10-CM | POA: Diagnosis not present

## 2020-07-24 DIAGNOSIS — C3412 Malignant neoplasm of upper lobe, left bronchus or lung: Secondary | ICD-10-CM | POA: Insufficient documentation

## 2020-07-24 DIAGNOSIS — E039 Hypothyroidism, unspecified: Secondary | ICD-10-CM | POA: Insufficient documentation

## 2020-07-24 LAB — COMPREHENSIVE METABOLIC PANEL
ALT: 22 U/L (ref 0–44)
AST: 27 U/L (ref 15–41)
Albumin: 3.5 g/dL (ref 3.5–5.0)
Alkaline Phosphatase: 63 U/L (ref 38–126)
Anion gap: 7 (ref 5–15)
BUN: 14 mg/dL (ref 8–23)
CO2: 25 mmol/L (ref 22–32)
Calcium: 8.8 mg/dL — ABNORMAL LOW (ref 8.9–10.3)
Chloride: 104 mmol/L (ref 98–111)
Creatinine, Ser: 1.04 mg/dL (ref 0.61–1.24)
GFR, Estimated: >60 mL/min (ref >60)
Glucose, Bld: 124 mg/dL — ABNORMAL HIGH (ref 70–99)
Potassium: 4 mmol/L (ref 3.5–5.1)
Sodium: 136 mmol/L (ref 135–145)
Total Bilirubin: 0.4 mg/dL (ref 0.3–1.2)
Total Protein: 6.6 g/dL (ref 6.5–8.1)

## 2020-07-24 LAB — CBC WITH DIFFERENTIAL/PLATELET
Abs Immature Granulocytes: 0.02 10*3/uL (ref 0.00–0.07)
Basophils Absolute: 0.1 10*3/uL (ref 0.0–0.1)
Basophils Relative: 1 %
Eosinophils Absolute: 0.3 10*3/uL (ref 0.0–0.5)
Eosinophils Relative: 5 %
HCT: 36.3 % — ABNORMAL LOW (ref 39.0–52.0)
Hemoglobin: 11.7 g/dL — ABNORMAL LOW (ref 13.0–17.0)
Immature Granulocytes: 0 %
Lymphocytes Relative: 10 %
Lymphs Abs: 0.6 10*3/uL — ABNORMAL LOW (ref 0.7–4.0)
MCH: 31.1 pg (ref 26.0–34.0)
MCHC: 32.2 g/dL (ref 30.0–36.0)
MCV: 96.5 fL (ref 80.0–100.0)
Monocytes Absolute: 0.6 10*3/uL (ref 0.1–1.0)
Monocytes Relative: 9 %
Neutro Abs: 4.7 10*3/uL (ref 1.7–7.7)
Neutrophils Relative %: 75 %
Platelets: 113 10*3/uL — ABNORMAL LOW (ref 150–400)
RBC: 3.76 MIL/uL — ABNORMAL LOW (ref 4.22–5.81)
RDW: 14.9 % (ref 11.5–15.5)
WBC: 6.2 10*3/uL (ref 4.0–10.5)
nRBC: 0 % (ref 0.0–0.2)

## 2020-07-24 LAB — MAGNESIUM: Magnesium: 1.8 mg/dL (ref 1.7–2.4)

## 2020-07-24 MED ORDER — HEPARIN SOD (PORK) LOCK FLUSH 100 UNIT/ML IV SOLN
500.0000 [IU] | Freq: Once | INTRAVENOUS | Status: AC | PRN
Start: 2020-07-24 — End: 2020-07-24
  Administered 2020-07-24: 500 [IU]

## 2020-07-24 MED ORDER — SODIUM CHLORIDE 0.9% FLUSH
10.0000 mL | INTRAVENOUS | Status: DC | PRN
  Administered 2020-07-24: 10 mL

## 2020-07-24 MED ORDER — SODIUM CHLORIDE 0.9 % IV SOLN: Freq: Once | INTRAVENOUS | Status: AC

## 2020-07-24 MED ORDER — SODIUM CHLORIDE 0.9 % IV SOLN
10.0000 mg/kg | Freq: Once | INTRAVENOUS | Status: AC
  Administered 2020-07-24: 740 mg via INTRAVENOUS
  Filled 2020-07-24: qty 10

## 2020-07-24 NOTE — Progress Notes (Signed)
Patient seen and labs reviewed by Dr. Lorenso Courier. Patient tolerated chemotherapy with no complaints voiced. Side effects with management reviewed understanding verbalized. Port site clean and dry with no bruising or swelling noted at site. Good blood return noted before and after administration of chemotherapy. Band aid applied. Patient left in satisfactory condition with VSS and no s/s of distress noted.

## 2020-07-24 NOTE — Progress Notes (Signed)
Received call report on CT Chest from 07/20/2020.  Patient will be in clinic on 07/24/20 to see Dr. Lorenso Courier.  He will review with patient at that time.   IMPRESSION: 1. New pulmonary nodule in the LEFT lower lobe. Recommend short interval CT or PET-CT follow-up (1 month). 2. New chronic appearing pneumothorax at the LEFT lung apex related to the atelectatic LEFT upper lobe. Previously there is fluid within this space. Pneumothorax presumably related to resorption of fluid and poor expansion of the atelectatic lung. 3. Stable LEFT perihilar radiation consolidation. 4. Stable LEFT peribronchial and AP window soft tissue thickening.

## 2020-07-24 NOTE — Patient Instructions (Signed)
[facility]  Discharge Instructions: Thank you for choosing [facility] to provide your oncology and hematology care.  If you have a lab appointment with the [facility], please come in thru the Main Entrance and check in at the main information desk.  Wear comfortable clothing and clothing appropriate for easy access to any Portacath or PICC line.   We strive to give you quality time with your provider. You may need to reschedule your appointment if you arrive late (15 or more minutes).  Arriving late affects you and other patients whose appointments are after yours.  Also, if you miss three or more appointments without notifying the office, you may be dismissed from the clinic at the provider's discretion.      For prescription refill requests, have your pharmacy contact our office and allow 72 hours for refills to be completed.    Today you received the following chemotherapy and/or immunotherapy agents: Imfinzi   To help prevent nausea and vomiting after your treatment, we encourage you to take your nausea medication as directed.  BELOW ARE SYMPTOMS THAT SHOULD BE REPORTED IMMEDIATELY: . *FEVER GREATER THAN 100.4 F (38 C) OR HIGHER . *CHILLS OR SWEATING . *NAUSEA AND VOMITING THAT IS NOT CONTROLLED WITH YOUR NAUSEA MEDICATION . *UNUSUAL SHORTNESS OF BREATH . *UNUSUAL BRUISING OR BLEEDING . *URINARY PROBLEMS (pain or burning when urinating, or frequent urination) . *BOWEL PROBLEMS (unusual diarrhea, constipation, pain near the anus) . TENDERNESS IN MOUTH AND THROAT WITH OR WITHOUT PRESENCE OF ULCERS (sore throat, sores in mouth, or a toothache) . UNUSUAL RASH, SWELLING OR PAIN  . UNUSUAL VAGINAL DISCHARGE OR ITCHING   Items with * indicate a potential emergency and should be followed up as soon as possible or go to the Emergency Department if any problems should occur.  Please show the CHEMOTHERAPY ALERT CARD or IMMUNOTHERAPY ALERT CARD at check-in to the  Emergency Department and triage nurse.  Should you have questions after your visit or need to cancel or reschedule your appointment, please contact [facility] [phone]  and follow the prompts.  Office hours are 8:00 a.m. to 4:30 p.m. Monday - Friday. Please note that voicemails left after 4:00 p.m. may not be returned until the following business day.  We are closed weekends and major holidays. You have access to a nurse at all times for urgent questions. Please call the main number to the clinic [phone] and follow the prompts.  For any non-urgent questions, you may also contact your provider using MyChart. We now offer e-Visits for anyone 54 and older to request care online for non-urgent symptoms. For details visit [email or web address].   Also download the MyChart app! Go to the app store, search "MyChart", open the app, select [facility], and log in with your MyChart username and password.  Due to Covid, a mask is required upon entering the hospital/clinic. If you do not have a mask, one will be given to you upon arrival. For doctor visits, patients may have 1 support person aged 66 or older with them. For treatment visits, patients cannot have anyone with them due to current Covid guidelines and our immunocompromised population.

## 2020-07-26 ENCOUNTER — Encounter (HOSPITAL_COMMUNITY): Payer: Self-pay | Admitting: Hematology

## 2020-07-26 ENCOUNTER — Encounter (HOSPITAL_COMMUNITY): Payer: Self-pay | Admitting: Hematology and Oncology

## 2020-08-07 ENCOUNTER — Ambulatory Visit (HOSPITAL_COMMUNITY): Payer: Medicare Other

## 2020-08-07 ENCOUNTER — Other Ambulatory Visit (HOSPITAL_COMMUNITY): Payer: Medicare Other

## 2020-08-07 ENCOUNTER — Ambulatory Visit (HOSPITAL_COMMUNITY): Payer: Medicare Other | Admitting: Hematology

## 2020-08-14 ENCOUNTER — Encounter (HOSPITAL_COMMUNITY): Payer: Self-pay | Admitting: Hematology and Oncology

## 2020-08-14 ENCOUNTER — Inpatient Hospital Stay (HOSPITAL_COMMUNITY): Payer: Medicare Other

## 2020-08-14 ENCOUNTER — Inpatient Hospital Stay (HOSPITAL_BASED_OUTPATIENT_CLINIC_OR_DEPARTMENT_OTHER): Payer: Medicare Other | Admitting: Hematology and Oncology

## 2020-08-14 ENCOUNTER — Other Ambulatory Visit: Payer: Self-pay

## 2020-08-14 VITALS — BP 134/79 | HR 86 | Temp 96.9°F | Resp 18

## 2020-08-14 VITALS — BP 142/88 | HR 92 | Temp 97.1°F | Resp 18 | Wt 168.4 lb

## 2020-08-14 DIAGNOSIS — Z5112 Encounter for antineoplastic immunotherapy: Secondary | ICD-10-CM | POA: Diagnosis not present

## 2020-08-14 DIAGNOSIS — C3492 Malignant neoplasm of unspecified part of left bronchus or lung: Secondary | ICD-10-CM

## 2020-08-14 DIAGNOSIS — Z95828 Presence of other vascular implants and grafts: Secondary | ICD-10-CM

## 2020-08-14 LAB — CBC WITH DIFFERENTIAL/PLATELET
Abs Immature Granulocytes: 0.02 10*3/uL (ref 0.00–0.07)
Basophils Absolute: 0.1 10*3/uL (ref 0.0–0.1)
Basophils Relative: 1 %
Eosinophils Absolute: 0.2 10*3/uL (ref 0.0–0.5)
Eosinophils Relative: 5 %
HCT: 37 % — ABNORMAL LOW (ref 39.0–52.0)
Hemoglobin: 12 g/dL — ABNORMAL LOW (ref 13.0–17.0)
Immature Granulocytes: 0 %
Lymphocytes Relative: 10 %
Lymphs Abs: 0.5 10*3/uL — ABNORMAL LOW (ref 0.7–4.0)
MCH: 31.8 pg (ref 26.0–34.0)
MCHC: 32.4 g/dL (ref 30.0–36.0)
MCV: 98.1 fL (ref 80.0–100.0)
Monocytes Absolute: 0.5 10*3/uL (ref 0.1–1.0)
Monocytes Relative: 9 %
Neutro Abs: 3.8 10*3/uL (ref 1.7–7.7)
Neutrophils Relative %: 75 %
Platelets: 127 10*3/uL — ABNORMAL LOW (ref 150–400)
RBC: 3.77 MIL/uL — ABNORMAL LOW (ref 4.22–5.81)
RDW: 14.2 % (ref 11.5–15.5)
WBC: 5.1 10*3/uL (ref 4.0–10.5)
nRBC: 0 % (ref 0.0–0.2)

## 2020-08-14 LAB — COMPREHENSIVE METABOLIC PANEL
ALT: 17 U/L (ref 0–44)
AST: 24 U/L (ref 15–41)
Albumin: 3.6 g/dL (ref 3.5–5.0)
Alkaline Phosphatase: 59 U/L (ref 38–126)
Anion gap: 7 (ref 5–15)
BUN: 18 mg/dL (ref 8–23)
CO2: 27 mmol/L (ref 22–32)
Calcium: 8.7 mg/dL — ABNORMAL LOW (ref 8.9–10.3)
Chloride: 104 mmol/L (ref 98–111)
Creatinine, Ser: 1.18 mg/dL (ref 0.61–1.24)
GFR, Estimated: >60 mL/min (ref >60)
Glucose, Bld: 89 mg/dL (ref 70–99)
Potassium: 3.9 mmol/L (ref 3.5–5.1)
Sodium: 138 mmol/L (ref 135–145)
Total Bilirubin: 0.4 mg/dL (ref 0.3–1.2)
Total Protein: 6.7 g/dL (ref 6.5–8.1)

## 2020-08-14 LAB — TSH: TSH: 3.403 u[IU]/mL (ref 0.350–4.500)

## 2020-08-14 MED ORDER — SODIUM CHLORIDE 0.9 % IV SOLN
10.0000 mg/kg | Freq: Once | INTRAVENOUS | Status: AC
  Administered 2020-08-14: 740 mg via INTRAVENOUS
  Filled 2020-08-14: qty 4.8

## 2020-08-14 MED ORDER — HEPARIN SOD (PORK) LOCK FLUSH 100 UNIT/ML IV SOLN
500.0000 [IU] | Freq: Once | INTRAVENOUS | Status: AC | PRN
  Administered 2020-08-14: 500 [IU]

## 2020-08-14 MED ORDER — SODIUM CHLORIDE 0.9% FLUSH
10.0000 mL | INTRAVENOUS | Status: DC | PRN
  Administered 2020-08-14: 10 mL

## 2020-08-14 MED ORDER — SODIUM CHLORIDE 0.9 % IV SOLN: Freq: Once | INTRAVENOUS | Status: AC

## 2020-08-14 NOTE — Progress Notes (Signed)
Patients port flushed without difficulty.  Good blood return noted with no bruising or swelling noted at site.  Stable during access and blood draw.  Patient to remain accessed for treatment. 

## 2020-08-14 NOTE — Patient Instructions (Signed)
[facility]  Discharge Instructions: Thank you for choosing [facility] to provide your oncology and hematology care.  If you have a lab appointment with the Cancer Center, please come in thru the Main Entrance and check in at the main information desk.  Wear comfortable clothing and clothing appropriate for easy access to any Portacath or PICC line.   We strive to give you quality time with your provider. You may need to reschedule your appointment if you arrive late (15 or more minutes).  Arriving late affects you and other patients whose appointments are after yours.  Also, if you miss three or more appointments without notifying the office, you may be dismissed from the clinic at the provider's discretion.      For prescription refill requests, have your pharmacy contact our office and allow 72 hours for refills to be completed.        To help prevent nausea and vomiting after your treatment, we encourage you to take your nausea medication as directed.  BELOW ARE SYMPTOMS THAT SHOULD BE REPORTED IMMEDIATELY: *FEVER GREATER THAN 100.4 F (38 C) OR HIGHER *CHILLS OR SWEATING *NAUSEA AND VOMITING THAT IS NOT CONTROLLED WITH YOUR NAUSEA MEDICATION *UNUSUAL SHORTNESS OF BREATH *UNUSUAL BRUISING OR BLEEDING *URINARY PROBLEMS (pain or burning when urinating, or frequent urination) *BOWEL PROBLEMS (unusual diarrhea, constipation, pain near the anus) TENDERNESS IN MOUTH AND THROAT WITH OR WITHOUT PRESENCE OF ULCERS (sore throat, sores in mouth, or a toothache) UNUSUAL RASH, SWELLING OR PAIN  UNUSUAL VAGINAL DISCHARGE OR ITCHING   Items with * indicate a potential emergency and should be followed up as soon as possible or go to the Emergency Department if any problems should occur.  Please show the CHEMOTHERAPY ALERT CARD or IMMUNOTHERAPY ALERT CARD at check-in to the Emergency Department and triage nurse.  Should you have questions after your visit or need to cancel  or reschedule your appointment, please contact [facility] [phone]  and follow the prompts.  Office hours are 8:00 a.m. to 4:30 p.m. Monday - Friday. Please note that voicemails left after 4:00 p.m. may not be returned until the following business day.  We are closed weekends and major holidays. You have access to a nurse at all times for urgent questions. Please call the main number to the clinic [phone] and follow the prompts.  For any non-urgent questions, you may also contact your provider using MyChart. We now offer e-Visits for anyone 18 and older to request care online for non-urgent symptoms. For details visit [email or web address].   Also download the MyChart app! Go to the app store, search "MyChart", open the app, select [facility], and log in with your MyChart username and password.  Due to Covid, a mask is required upon entering the hospital/clinic. If you do not have a mask, one will be given to you upon arrival. For doctor visits, patients may have 1 support person aged 18 or older with them. For treatment visits, patients cannot have anyone with them due to current Covid guidelines and our immunocompromised population.  

## 2020-08-14 NOTE — Progress Notes (Signed)
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  PRIOR THERAPY: Concurrent chemoradiation with weekly carboplatin and paclitaxel x 6 cycles from 11/28/2019 to 01/02/2020  NGS Results: not done  CURRENT THERAPY: Durvalumab every 2 weeks  BRIEF ONCOLOGIC HISTORY:  Oncology History  Squamous cell lung cancer, left ([facility])  11/08/2019 Initial Diagnosis   Squamous cell lung cancer, left ([facility])   11/08/2019 Cancer Staging   Staging form: Lung, AJCC 8th Edition - Clinical stage from 11/08/2019: Stage IIIB (cT4, cN2, cM0) - Signed by Derek Jack, MD on 11/08/2019   11/28/2019 - 01/02/2020 Chemotherapy   The patient had palonosetron (ALOXI) injection 0.25 mg, 0.25 mg, Intravenous,  Once, 6 of 6 cycles Administration: 0.25 mg (11/28/2019), 0.25 mg (12/05/2019), 0.25 mg (12/12/2019), 0.25 mg (12/19/2019), 0.25 mg (12/26/2019), 0.25 mg (01/02/2020) CARBOplatin (PARAPLATIN) 180 mg in sodium chloride 0.9 % 250 mL chemo infusion, 180 mg (100 % of original dose 176 mg), Intravenous,  Once, 6 of 6 cycles Dose modification:   (original dose 176 mg, Cycle 1),   (original dose 201.2 mg, Cycle 2),   (original dose 179.2 mg, Cycle 3),   (original dose 179.2 mg, Cycle 4),   (original dose 195.4 mg, Cycle 5),   (original dose 178.2 mg, Cycle 6) Administration: 180 mg (11/28/2019), 200 mg (12/05/2019), 180 mg (12/12/2019), 180 mg (12/19/2019), 190 mg (12/26/2019), 180 mg (01/02/2020) PACLitaxel (TAXOL) 84 mg in sodium chloride 0.9 % 250 mL chemo infusion (</= 80mg /m2), 45 mg/m2 = 84 mg, Intravenous,  Once, 6 of 6 cycles Administration: 84 mg (11/28/2019), 84 mg (12/05/2019), 84 mg (12/12/2019), 84 mg (12/19/2019), 84 mg (12/26/2019), 84 mg (01/02/2020)  for chemotherapy treatment.    01/30/2020  -  Chemotherapy    Patient is on Treatment Plan: LUNG DURVALUMAB Q14D        CANCER STAGING: Cancer Staging Squamous cell lung cancer, left ([facility]) Staging form: Lung, AJCC 8th Edition - Clinical stage from 11/08/2019: Stage IIIB (cT4, cN2, cM0) - Signed by Derek Jack, MD on 11/08/2019   INTERVAL HISTORY:  Mr. BURNICE OESTREICHER, a 70 y.o. male, returns for routine follow-up and consideration for next cycle of chemotherapy. Griffyn was last seen on 07/24/2020.  Due for cycle #13 of Durvalumab today.   On exam today Mr. Keimig reports he has been well in the interim since his last visit.  He is accompanied by his wife today.  He reports he is having some discomfort under his left ribs.  He notes that it can be both sharp and dull pain.  Is worse at night when he lays down flat.  He reports also some occasional pain in the left side of his chest.  He reports that he is not having any baseline shortness of breath but cannot if he is exerting himself.  He is not having any issues with nausea, vomiting, or diarrhea.  He denies any fevers, chills, sweats.  A full 10 point ROS is listed below.  Overall, he feels ready for next cycle of chemo today.   REVIEW OF SYSTEMS:  Review of Systems  Constitutional:  Positive for appetite change (75%) and fatigue (50%).  HENT:   Positive for trouble swallowing.   Respiratory:  Positive for cough.   Gastrointestinal:  Negative for diarrhea.  Skin:  Negative for itching and rash.  All other systems reviewed and are negative.  PAST MEDICAL/SURGICAL HISTORY:  Past Medical History:  Diagnosis Date   Arthritis    Hyperlipemia    Hypertension    Hyperthyroidism    Lung cancer ([facility])    Myocardial infarction ([facility])    Paroxysmal atrial fibrillation ([facility])    Port-A-Cath in place 11/14/2019   Tachycardia    Past Surgical History:  Procedure Laterality Date   CARDIAC CATHETERIZATION     PORTACATH PLACEMENT Right 11/01/2019   Procedure: INSERTION  PORT-A-CATH;  Surgeon: Aviva Signs, MD;  Location: [facility];  Service: General;  Laterality: Right;    SOCIAL HISTORY:  Social History   Socioeconomic History   Marital status: Married    Spouse name: Not on file   Number of children: 1   Years of education: Not on file   Highest education level: Not on file  Occupational History   Occupation: retired   Occupation: Glass blower/designer: [employer]  Tobacco Use   Smoking status: Former    Years: 45.00    Pack years: 0.00    Types: Cigars, Cigarettes    Quit date: 10/19/2014    Years since quitting: 5.8   Smokeless tobacco: Never  Vaping Use   Vaping Use: Never used  Substance and Sexual Activity   Alcohol use: Never   Drug use: No   Sexual activity: Not on file  Other Topics Concern   Not on file  Social History Narrative   Not on file   Social Determinants of Health   Financial Resource Strain: Medium Risk   Difficulty of Paying Living Expenses: Somewhat hard  Food Insecurity: No Food Insecurity   Worried About Charity fundraiser in the Last Year: Never true   [place] in the Last Year: Never true  Transportation Needs: No Transportation Needs   Lack of Transportation (Medical): No   Lack of Transportation (Non-Medical): No  Physical Activity: Inactive   Days of Exercise per Week: 0 days   Minutes of Exercise per Session: 0 min  Stress: Stress Concern Present   Feeling of Stress : Very much  Social Connections: Moderately Isolated   Frequency of Communication with Friends and Family: More than three times a week   Frequency of Social Gatherings with Friends and Family: More than three times a week   Attends Religious Services: Never   Marine scientist or Organizations: No   Attends Music therapist: Never   Marital Status: Married  Human resources officer Violence: Not At Risk   Fear of Current or Ex-Partner: No   Emotionally Abused: No   Physically Abused: No   Sexually Abused: No     FAMILY HISTORY:  Family History  Problem Relation Age of Onset   Hypertension Father    Dementia Father        died age 16   Heart disease Father    Heart failure Other     CURRENT MEDICATIONS:  Current Outpatient Medications  Medication Sig Dispense Refill   acetaminophen (TYLENOL) 500 MG tablet Take 500 mg by mouth every 6 (six) hours as needed for moderate pain or headache. (Patient not taking: Reported on 07/24/2020)     albuterol (PROVENTIL) (2.5 MG/3ML) 0.083% nebulizer solution Take 3 mLs (2.5 mg total) by nebulization every 6 (six) hours as needed for wheezing or shortness of breath. (Patient not taking: Reported on 07/24/2020) 75 mL  12   ALPRAZolam (XANAX) 0.25 MG tablet TAKE 1 TABLET BY MOUTH THREE TIMES DAILY AS NEEDED FOR ANXIETY 30 tablet 3   dronabinol (MARINOL) 5 MG capsule Take 1 capsule (5 mg total) by mouth 2 (two) times daily before a meal. 60 capsule 2   ELIQUIS 5 MG TABS tablet Take 1 tablet by mouth twice daily 60 tablet 5   HYCODAN 5-1.5 MG/5ML syrup take 15 mls BY MOUTH EVERY 8 HOURS AS NEEDED FOR cough 473 mL 0   levothyroxine (SYNTHROID) 100 MCG tablet Take 1 tablet (100 mcg total) by mouth daily before breakfast. 30 tablet 3   lidocaine (XYLOCAINE) 2 % solution TAKE 15 ML BY MOUTH  4 TIMES DAILY WITH MEALS 480 mL 0   lidocaine-prilocaine (EMLA) cream Apply 1 application topically as needed.     metoprolol tartrate (LOPRESSOR) 25 MG tablet Take one tablet(25 mg )  in the AM and take 1/2 Tablet (12.5mg ) in the PM 135 tablet 3   No current facility-administered medications for this visit.    ALLERGIES:  No Known Allergies  PHYSICAL EXAM:  Performance status (ECOG): 1 - Symptomatic but completely ambulatory  There were no vitals filed for this visit.  Wt Readings from Last 3 Encounters:  07/24/20 169 lb 12.8 oz (77 kg)  07/10/20 165 lb 8 oz (75.1 kg)  06/26/20 167 lb (75.8 kg)   Physical Exam Vitals reviewed.  Constitutional:      Appearance: Normal  appearance.  Cardiovascular:     Rate and Rhythm: Normal rate and regular rhythm.     Pulses: Normal pulses.     Heart sounds: Normal heart sounds.  Pulmonary:     Effort: Pulmonary effort is normal.     Breath sounds: Normal breath sounds.  Abdominal:     Palpations: Abdomen is soft. There is no hepatomegaly, splenomegaly or mass.     Tenderness: There is no abdominal tenderness.  Neurological:     General: No focal deficit present.     Mental Status: He is alert and oriented to person, place, and time.  Psychiatric:        Mood and Affect: Mood normal.        Behavior: Behavior normal.    LABORATORY DATA:  I have reviewed the labs as listed.  CBC Latest Ref Rng & Units 07/24/2020 07/10/2020 06/26/2020  WBC 4.0 - 10.5 K/uL 6.2 6.0 6.5  Hemoglobin 13.0 - 17.0 g/dL 11.7(L) 12.5(L) 12.5(L)  Hematocrit 39.0 - 52.0 % 36.3(L) 38.8(L) 39.1  Platelets 150 - 400 K/uL 113(L) 127(L) 130(L)   CMP Latest Ref Rng & Units 07/24/2020 07/10/2020 06/26/2020  Glucose 70 - 99 mg/dL 124(H) 96 104(H)  BUN 8 - 23 mg/dL 14 16 17   Creatinine 0.61 - 1.24 mg/dL 1.04 1.25(H) 1.13  Sodium 135 - 145 mmol/L 136 136 134(L)  Potassium 3.5 - 5.1 mmol/L 4.0 3.8 4.3  Chloride 98 - 111 mmol/L 104 103 101  CO2 22 - 32 mmol/L 25 26 26   Calcium 8.9 - 10.3 mg/dL 8.8(L) 8.9 8.9  Total Protein 6.5 - 8.1 g/dL 6.6 6.9 7.1  Total Bilirubin 0.3 - 1.2 mg/dL 0.4 0.7 0.6  Alkaline Phos 38 - 126 U/L 63 62 67  AST 15 - 41 U/L 27 38 31  ALT 0 - 44 U/L 22 33 25    DIAGNOSTIC IMAGING:  I have independently reviewed the scans and discussed with the patient. CT Chest Wo Contrast  Result Date: 07/21/2020 CLINICAL DATA:  LEFT lung cancer. Squamous cell carcinoma. Status post chemo and radiation therapy. EXAM: CT CHEST WITHOUT CONTRAST TECHNIQUE: Multidetector CT imaging of the chest was performed following the standard protocol without IV contrast. COMPARISON:  04/27/2020 FINDINGS: Cardiovascular: Coronary artery calcification and  aortic atherosclerotic calcification. Mediastinum/Nodes: Fullness in the AP window and poor definition of fat planes around the LEFT mainstem bronchus and LEFT hilar not changed from comparison exam. No enlarged mediastinal or supraclavicular nodes. Port in the anterior chest wall with tip in distal SVC. Lungs/Pleura: Rounded consolidation with air bronchograms about the LEFT hilum not changed from comparison exam. New pneumothorax over the LEFT lung apex. Previously fluid filling the space. Favor chronic pneumothorax related to resorption of fluid and poor expansion lung. Discrete nodule within the LEFT lower lobe measures 7 mm (59/series 4 )which was not clearly evident on comparison exam. Atelectasis and small effusion on the LEFT base is similar. RIGHT lung is clear. Upper Abdomen: Limited view of the liver, kidneys, pancreas are unremarkable. Normal adrenal glands. Musculoskeletal: Stable lytic lesion at T1 measures 10 mm compared to 10 mm (image 115/series 6/coronal imaging IMPRESSION: 1. New pulmonary nodule in the LEFT lower lobe. Recommend short interval CT or PET-CT follow-up (1 month). 2. New chronic appearing pneumothorax at the LEFT lung apex related to the atelectatic LEFT upper lobe. Previously there is fluid within this space. Pneumothorax presumably related to resorption of fluid and poor expansion of the atelectatic lung. 3. Stable LEFT perihilar radiation consolidation. 4. Stable LEFT peribronchial and AP window soft tissue thickening. These results will be called to the ordering clinician or representative by the Radiologist Assistant, and communication documented in the PACS or [employer]. Electronically Signed   By: Suzy Bouchard M.D.   On: 07/21/2020 13:28      ASSESSMENT:  1.  Left lung cancer: -Presentation to the ER on 10/06/2019 with cough and dizziness. -CT chest with contrast on 10/06/2019 showed large left suprahilar mass completely obstructing left upper lobe bronchus,  measuring 7 x 5.3 x 5.8 cm.  Mass extends into AP window and narrows the left mainstem bronchus.  Small subpleural nodule in the left lower lobe measuring 4 mm indeterminate.  AP window nodal mass measuring 3.4 cm.  No supraclavicular adenopathy.  Subcarinal enlarged lymph node measures 1.4 cm. -PET scan on 10/14/2019 shows large hypermetabolic left upper lobe mass surrounding the left hilum, extending into pleural surface not invading the chest wall.  Direct invasion into the AP window and metastatic subcarinal lymph node, T4 N2 M0.  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 -10 pound weight loss in the last 6 months.  Hoarseness for last 1 week. -MRI of the brain on 10/28/2019 with no evidence of metastasis. -Concurrent chemoradiation therapy started on 11/28/2019. Completed 01/02/20 ( chemo) 01/06/2020(radiation) -CT chest on 01/20/2020 showed significant interval decrease in consolidation of the left upper lobe, interval decrease in irregular nodularity and consolidation of the left lower lobe.  Unchanged posttreatment appearance of soft tissue about the left hilum and AP window.  No discretely enlarged mediastinal or hilar lymph nodes.  Trace left pleural effusion, decreased compared to prior exam. -Consolidation durvalumab started on 01/30/2020. -CT chest with contrast on 03/30/2020 with the development of small left-sided pleural effusion with further volume loss.  Bandlike scarring in the left lower lobe since prior study likely posttreatment changes.  Small bone lesion at T1 stable.  1 small nodule in the right chest  which is new about 4 mm.  Will monitor.   2.  Social/family history: -He smoked 1 pack/day for 45 years, quit 5 years ago. -No family history of malignancies.   PLAN:  1.  Stage III (T4 N2 M0) squamous cell carcinoma of the left lung: -He was evaluated by Dr. Benjamine Mola for hoarseness.  He will have a procedure done. - He does not  report any immunotherapy related side effects. - Reviewed his labs which showed normal LFTs.  CBC with modest drop in Hgb and Plt.  Creatinine was 1.18 and slightly elevated.  Encouraged hydration. -- CT scan on 07/20/2020 notes a new 24mm LLL nodule, no previously present. Short term repeat imaging recommended per radiology.  - Recommend proceeding with treatment today. - RTC 2 weeks for follow-up.     2.  Cough: -He is not requiring Hycodan on a regular basis.  Cough has improved.   3.  Atrial fibrillation: -Continue Eliquis 5 mg twice daily.  No bleeding issues reported.   4.  Weight loss: -Continue Marinol 5 mg twice daily. - He is continuing to eat well and gaining weight.   5.  Hypertension: -Continue metoprolol 1 tablet in the morning and half tablet in the evening.   6.  Hypothyroidism: -Last TSH was 22.9.  T4 was low at 3.4. on 06/26/2020.  - started on Synthroid 100 mcg daily.  We will recheck his TSH in 6 weeks.    Orders placed this encounter:  No orders of the defined types were placed in this encounter.   Ledell Peoples, MD Department of Hematology/Oncology [place] [facility] Phone: [phone] Pager: [phone] Email: [email or web address]

## 2020-08-14 NOTE — Progress Notes (Signed)
Patient here today for chemotherapy treatment.  Patient tolerated treatment well with no complaints.  Patient discharged ambulatory in stable condition.  Vital signs stable at discharge.

## 2020-08-14 NOTE — Progress Notes (Signed)
Labs reviewed with MD today.will proceed with treatment today per MD.

## 2020-08-14 NOTE — Progress Notes (Signed)
Patient has been assessed, vital signs and labs have been reviewed by Dr. Katragadda. ANC, Creatinine, LFTs, and Platelets are within treatment parameters per Dr. Dorsey. The patient is good to proceed with treatment at this time.  Primary RN and pharmacy aware.  

## 2020-08-16 ENCOUNTER — Other Ambulatory Visit (HOSPITAL_COMMUNITY): Payer: Self-pay | Admitting: Hematology

## 2020-08-16 DIAGNOSIS — C3492 Malignant neoplasm of unspecified part of left bronchus or lung: Secondary | ICD-10-CM

## 2020-08-27 ENCOUNTER — Other Ambulatory Visit (HOSPITAL_COMMUNITY): Payer: Self-pay | Admitting: Hematology

## 2020-08-27 NOTE — Progress Notes (Signed)
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  PRIOR THERAPY:  Concurrent chemoradiation with weekly carboplatin and paclitaxel x 6 cycles from 11/28/2019 to 01/02/2020  NGS Results: not done  CURRENT THERAPY: Durvalumab every 2 weeks  BRIEF ONCOLOGIC HISTORY:  Oncology History  Squamous cell lung cancer, left ([place])  11/08/2019 Initial Diagnosis   Squamous cell lung cancer, left ([place])    11/08/2019 Cancer Staging   Staging form: Lung, AJCC 8th Edition - Clinical stage from 11/08/2019: Stage IIIB (cT4, cN2, cM0) - Signed by Derek Jack, MD on 11/08/2019    11/28/2019 - 01/02/2020 Chemotherapy   The patient had palonosetron (ALOXI) injection 0.25 mg, 0.25 mg, Intravenous,  Once, 6 of 6 cycles Administration: 0.25 mg (11/28/2019), 0.25 mg (12/05/2019), 0.25 mg (12/12/2019), 0.25 mg (12/19/2019), 0.25 mg (12/26/2019), 0.25 mg (01/02/2020) CARBOplatin (PARAPLATIN) 180 mg in sodium chloride 0.9 % 250 mL chemo infusion, 180 mg (100 % of original dose 176 mg), Intravenous,  Once, 6 of 6 cycles Dose modification:   (original dose 176 mg, Cycle 1),   (original dose 201.2 mg, Cycle 2),   (original dose 179.2 mg, Cycle 3),   (original dose 179.2 mg, Cycle 4),   (original dose 195.4 mg, Cycle 5),   (original dose 178.2 mg, Cycle 6) Administration: 180 mg (11/28/2019), 200 mg (12/05/2019), 180 mg (12/12/2019), 180 mg (12/19/2019), 190 mg (12/26/2019), 180 mg (01/02/2020) PACLitaxel (TAXOL) 84 mg in sodium chloride 0.9 % 250 mL chemo infusion (</= 80mg /m2), 45 mg/m2 = 84 mg, Intravenous,  Once, 6 of 6 cycles Administration: 84 mg (11/28/2019), 84 mg (12/05/2019), 84 mg (12/12/2019), 84 mg (12/19/2019), 84 mg (12/26/2019), 84 mg (01/02/2020)   for chemotherapy treatment.      01/30/2020 -  Chemotherapy    Patient is on Treatment Plan: LUNG DURVALUMAB Q14D         CANCER STAGING: Cancer Staging Squamous cell lung cancer, left ([place]) Staging form: Lung, AJCC 8th Edition - Clinical stage from 11/08/2019: Stage IIIB (cT4, cN2, cM0) - Signed by Derek Jack, MD on 11/08/2019  Virtual Visit via Video Note  I connected with Alan [name] on @TODAY @ at  9:00 AM EDT by a video enabled telemedicine application and verified that I am speaking with the correct person using two identifiers.  Location: Patient: Alan [name] Provider: Derek Jack, MD Other person present: Alan Lombard, RN   I discussed the limitations of evaluation and management by telemedicine and the availability of in person appointments. The patient expressed understanding and agreed to proceed.  INTERVAL HISTORY:  Alan [name], a 70 y.o. male, returns for routine follow-up and consideration for next cycle of immunotherapy. Alan [name] was last seen on 07/10/20.  Due for cycle #14 of imfinzi today.   Overall, he tells me he has been feeling pretty well. He reports that he is tolerating his treatment well. He denies any worsening cough or SOB. He denies skin rash, diarrhea, fatigue, recent infections, bleeding issues, or headaches. He reports he is sleeping more frequently during the day and is unable to sleep at night. He reports increased pain over the past week on the left side of his abdomen that is worsened with at night after laying down and during the day while coughing; this pain can  fluctuate between sharp and dull. The pain is not worsened with deep breaths, movement, or lying on either side. He is currently treating this pain with tylenol which has helped. The area is tender to touch. He is taking Marinol and Eliquis.   Overall, he feels ready for next cycle of immunotherapy today.   REVIEW OF SYSTEMS:  Review of Systems  Constitutional:  Negative for fatigue.   Respiratory:  Negative for cough and shortness of breath.   Gastrointestinal:  Negative for diarrhea.  Skin:  Negative for rash.  Neurological:  Negative for headaches.  Psychiatric/Behavioral:  Positive for suicidal ideas.   All other systems reviewed and are negative.  PAST MEDICAL/SURGICAL HISTORY:  Past Medical History:  Diagnosis Date   Arthritis    Hyperlipemia    Hypertension    Hyperthyroidism    Lung cancer ([place])    Myocardial infarction ([place])    Paroxysmal atrial fibrillation ([place])    Port-A-Cath in place 11/14/2019   Tachycardia    Past Surgical History:  Procedure Laterality Date   CARDIAC CATHETERIZATION     PORTACATH PLACEMENT Right 11/01/2019   Procedure: INSERTION PORT-A-CATH;  Surgeon: Aviva Signs, MD;  Location: [facility];  Service: General;  Laterality: Right;    SOCIAL HISTORY:  Social History   Socioeconomic History   Marital status: Married    Spouse name: Not on file   Number of children: 1   Years of education: Not on file   Highest education level: Not on file  Occupational History   Occupation: retired   Occupation: Glass blower/designer: [employer]  Tobacco Use   Smoking status: Former    Years: 45.00    Pack years: 0.00    Types: Cigars, Cigarettes    Quit date: 10/19/2014    Years since quitting: 5.8   Smokeless tobacco: Never  Vaping Use   Vaping Use: Never used  Substance and Sexual Activity   Alcohol use: Never   Drug use: No   Sexual activity: Not on file  Other Topics Concern   Not on file  Social History Narrative   Not on file   Social Determinants of Health   Financial Resource Strain: Medium Risk   Difficulty of Paying Living Expenses: Somewhat hard  Food Insecurity: No Food Insecurity   Worried About Charity fundraiser in the Last Year: Never true   [place] in the Last Year: Never true  Transportation Needs: No Transportation Needs   Lack of Transportation (Medical): No   Lack of Transportation  (Non-Medical): No  Physical Activity: Inactive   Days of Exercise per Week: 0 days   Minutes of Exercise per Session: 0 min  Stress: Stress Concern Present   Feeling of Stress : Very much  Social Connections: Moderately Isolated   Frequency of Communication with Friends and Family: More than three times a week   Frequency of Social Gatherings with Friends and Family: More than three times a week   Attends Religious Services: Never   Marine scientist or Organizations: No   Attends Music therapist: Never   Marital Status: Married  Human resources officer Violence: Not At Risk   Fear of Current or Ex-Partner: No   Emotionally Abused: No   Physically Abused: No   Sexually Abused: No    FAMILY HISTORY:  Family History  Problem Relation Age of Onset   Hypertension Father    Dementia Father  died age 26   Heart disease Father    Heart failure Other     CURRENT MEDICATIONS:  Current Outpatient Medications  Medication Sig Dispense Refill   acetaminophen (TYLENOL) 500 MG tablet Take 500 mg by mouth every 6 (six) hours as needed for moderate pain or headache.     albuterol (PROVENTIL) (2.5 MG/3ML) 0.083% nebulizer solution Take 3 mLs (2.5 mg total) by nebulization every 6 (six) hours as needed for wheezing or shortness of breath. 75 mL 12   ALPRAZolam (XANAX) 0.25 MG tablet TAKE 1 TABLET BY MOUTH THREE TIMES DAILY AS NEEDED FOR ANXIETY 30 tablet 0   diazepam (VALIUM) 2 MG tablet Take 2 mg by mouth 2 (two) times daily.     dronabinol (MARINOL) 5 MG capsule Take 1 capsule (5 mg total) by mouth 2 (two) times daily before a meal. 60 capsule 2   ELIQUIS 5 MG TABS tablet Take 1 tablet by mouth twice daily 60 tablet 5   HYCODAN 5-1.5 MG/5ML syrup take 15 mls BY MOUTH EVERY 8 HOURS AS NEEDED FOR cough 473 mL 0   levothyroxine (SYNTHROID) 100 MCG tablet Take 1 tablet (100 mcg total) by mouth daily before breakfast. 30 tablet 3   lidocaine (XYLOCAINE) 2 % solution TAKE 15 ML BY  MOUTH  4 TIMES DAILY WITH MEALS 480 mL 0   lidocaine-prilocaine (EMLA) cream Apply 1 application topically as needed.     metoprolol tartrate (LOPRESSOR) 25 MG tablet Take one tablet(25 mg )  in the AM and take 1/2 Tablet (12.5mg ) in the PM 135 tablet 3   Durvalumab (IMFINZI IV) Inject into the vein.     No current facility-administered medications for this visit.    ALLERGIES:  No Known Allergies  Performance status (ECOG): 1 - Symptomatic but completely ambulatory  Vitals:   08/28/20 0800  BP: (!) 152/76  Pulse: 86  Resp: 18  Temp: (!) 96.9 F (36.1 C)  SpO2: 97%   Wt Readings from Last 3 Encounters:  08/28/20 168 lb 3.2 oz (76.3 kg)  08/14/20 168 lb 6.4 oz (76.4 kg)  07/24/20 169 lb 12.8 oz (77 kg)    LABORATORY DATA:  I have reviewed the labs as listed.  CBC Latest Ref Rng & Units 08/14/2020 07/24/2020 07/10/2020  WBC 4.0 - 10.5 K/uL 5.1 6.2 6.0  Hemoglobin 13.0 - 17.0 g/dL 12.0(L) 11.7(L) 12.5(L)  Hematocrit 39.0 - 52.0 % 37.0(L) 36.3(L) 38.8(L)  Platelets 150 - 400 K/uL 127(L) 113(L) 127(L)   CMP Latest Ref Rng & Units 08/14/2020 07/24/2020 07/10/2020  Glucose 70 - 99 mg/dL 89 124(H) 96  BUN 8 - 23 mg/dL 18 14 16   Creatinine 0.61 - 1.24 mg/dL 1.18 1.04 1.25(H)  Sodium 135 - 145 mmol/L 138 136 136  Potassium 3.5 - 5.1 mmol/L 3.9 4.0 3.8  Chloride 98 - 111 mmol/L 104 104 103  CO2 22 - 32 mmol/L 27 25 26   Calcium 8.9 - 10.3 mg/dL 8.7(L) 8.8(L) 8.9  Total Protein 6.5 - 8.1 g/dL 6.7 6.6 6.9  Total Bilirubin 0.3 - 1.2 mg/dL 0.4 0.4 0.7  Alkaline Phos 38 - 126 U/L 59 63 62  AST 15 - 41 U/L 24 27 38  ALT 0 - 44 U/L 17 22 33    DIAGNOSTIC IMAGING:  I have independently reviewed the scans and discussed with the patient. No results found.   ASSESSMENT:  1.  Left lung cancer: -Presentation to the ER on 10/06/2019 with cough and dizziness. -CT chest  with contrast on 10/06/2019 showed large left suprahilar mass completely obstructing left upper lobe bronchus, measuring 7 x  5.3 x 5.8 cm.  Mass extends into AP window and narrows the left mainstem bronchus.  Small subpleural nodule in the left lower lobe measuring 4 mm indeterminate.  AP window nodal mass measuring 3.4 cm.  No supraclavicular adenopathy.  Subcarinal enlarged lymph node measures 1.4 cm. -PET scan on 10/14/2019 shows large hypermetabolic left upper lobe mass surrounding the left hilum, extending into pleural surface not invading the chest wall.  Direct invasion into the AP window and metastatic subcarinal lymph node, T4 N2 M0.  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 -10 pound weight loss in the last 6 months.  Hoarseness for last 1 week. -MRI of the brain on 10/28/2019 with no evidence of metastasis. -Concurrent chemoradiation therapy started on 11/28/2019. Completed 01/02/20 ( chemo) 01/06/2020(radiation) -CT chest on 01/20/2020 showed significant interval decrease in consolidation of the left upper lobe, interval decrease in irregular nodularity and consolidation of the left lower lobe.  Unchanged posttreatment appearance of soft tissue about the left hilum and AP window.  No discretely enlarged mediastinal or hilar lymph nodes.  Trace left pleural effusion, decreased compared to prior exam. -Consolidation durvalumab started on 01/30/2020. -CT chest with contrast on 03/30/2020 with the development of small left-sided pleural effusion with further volume loss.  Bandlike scarring in the left lower lobe since prior study likely posttreatment changes.  Small bone lesion at T1 stable.  1 small nodule in the right chest which is new about 4 mm.  Will monitor.   2.  Social/family history: -He smoked 1 pack/day for 45 years, quit 5 years ago. -No family history of malignancies.   PLAN:  1.  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  Continue left lower lobe lung nodule measuring 7  mm.  New chronic appearing pneumothorax at the left lung apex related to atelectasis in the upper lobe.  Stable left perihilar radiation consolidation.  Stable left peribronchial and AP window soft tissue thickening. - I have reviewed his labs today which showed normal LFTs and renal function.  CBC was grossly normal.  He does not report any immunotherapy related side effects. - He will proceed with durvalumab today.  RTC 2 weeks for follow-up.  I plan to repeat CT chest with contrast. - He reported discomfort/pain in the left upper quadrant worse at night.  No worsening on breathing or movements.  He describes pain as sharp to dull.  It lasted few hours.  He usually takes Tylenol which helps.  Cough makes the pain worse.  No relation to food.  I have also recommended CT scan of the abdomen.   2.  Cough: - Continue Hycodan as needed.  Some days he does not require it.   3.  Atrial fibrillation: - Continue Eliquis 5 mg twice daily.  No bleeding issues reported.   4.  Weight loss: - Continue Marinol 5 mg twice daily.  No weight loss.   5.  Hypertension: - Continue metoprolol 1 tablet in the morning and half tablet in the evening.   6.  Hypothyroidism: - TSH today is 1.232.  Continue Synthroid 100 mcg daily.   Orders placed this encounter:  No orders of the defined types were placed in this encounter.  I provided 30 minutes of non-face-to-face time  during this encounter.  Derek Jack, MD [place] [phone]   I, Thana Ates, am acting as a scribe for Dr. Derek Jack.  I, Derek Jack MD, have reviewed the above documentation for accuracy and completeness, and I agree with the above.

## 2020-08-28 ENCOUNTER — Inpatient Hospital Stay (HOSPITAL_COMMUNITY): Payer: Medicare Other

## 2020-08-28 ENCOUNTER — Encounter (HOSPITAL_COMMUNITY): Payer: Self-pay | Admitting: Hematology

## 2020-08-28 ENCOUNTER — Inpatient Hospital Stay (HOSPITAL_COMMUNITY): Payer: Medicare Other | Attending: Hematology and Oncology

## 2020-08-28 ENCOUNTER — Inpatient Hospital Stay (HOSPITAL_BASED_OUTPATIENT_CLINIC_OR_DEPARTMENT_OTHER): Payer: Medicare Other | Admitting: Hematology

## 2020-08-28 ENCOUNTER — Other Ambulatory Visit: Payer: Self-pay

## 2020-08-28 VITALS — BP 147/85 | HR 84 | Temp 96.8°F | Resp 18

## 2020-08-28 VITALS — BP 152/76 | HR 86 | Temp 96.9°F | Resp 18 | Wt 168.2 lb

## 2020-08-28 DIAGNOSIS — R634 Abnormal weight loss: Secondary | ICD-10-CM | POA: Insufficient documentation

## 2020-08-28 DIAGNOSIS — I4891 Unspecified atrial fibrillation: Secondary | ICD-10-CM | POA: Insufficient documentation

## 2020-08-28 DIAGNOSIS — Z7901 Long term (current) use of anticoagulants: Secondary | ICD-10-CM | POA: Diagnosis not present

## 2020-08-28 DIAGNOSIS — C3492 Malignant neoplasm of unspecified part of left bronchus or lung: Secondary | ICD-10-CM | POA: Diagnosis not present

## 2020-08-28 DIAGNOSIS — Z95828 Presence of other vascular implants and grafts: Secondary | ICD-10-CM

## 2020-08-28 DIAGNOSIS — E039 Hypothyroidism, unspecified: Secondary | ICD-10-CM | POA: Diagnosis not present

## 2020-08-28 DIAGNOSIS — Z5112 Encounter for antineoplastic immunotherapy: Secondary | ICD-10-CM | POA: Insufficient documentation

## 2020-08-28 DIAGNOSIS — R059 Cough, unspecified: Secondary | ICD-10-CM | POA: Insufficient documentation

## 2020-08-28 DIAGNOSIS — C3412 Malignant neoplasm of upper lobe, left bronchus or lung: Secondary | ICD-10-CM | POA: Diagnosis present

## 2020-08-28 DIAGNOSIS — I10 Essential (primary) hypertension: Secondary | ICD-10-CM | POA: Diagnosis not present

## 2020-08-28 LAB — CBC WITH DIFFERENTIAL/PLATELET
Abs Immature Granulocytes: 0.02 10*3/uL (ref 0.00–0.07)
Basophils Absolute: 0.1 10*3/uL (ref 0.0–0.1)
Basophils Relative: 1 %
Eosinophils Absolute: 0.2 10*3/uL (ref 0.0–0.5)
Eosinophils Relative: 3 %
HCT: 37 % — ABNORMAL LOW (ref 39.0–52.0)
Hemoglobin: 12.4 g/dL — ABNORMAL LOW (ref 13.0–17.0)
Immature Granulocytes: 0 %
Lymphocytes Relative: 10 %
Lymphs Abs: 0.7 10*3/uL (ref 0.7–4.0)
MCH: 32.5 pg (ref 26.0–34.0)
MCHC: 33.5 g/dL (ref 30.0–36.0)
MCV: 96.9 fL (ref 80.0–100.0)
Monocytes Absolute: 0.6 10*3/uL (ref 0.1–1.0)
Monocytes Relative: 9 %
Neutro Abs: 5.1 10*3/uL (ref 1.7–7.7)
Neutrophils Relative %: 77 %
Platelets: 134 10*3/uL — ABNORMAL LOW (ref 150–400)
RBC: 3.82 MIL/uL — ABNORMAL LOW (ref 4.22–5.81)
RDW: 13.6 % (ref 11.5–15.5)
WBC: 6.7 10*3/uL (ref 4.0–10.5)
nRBC: 0 % (ref 0.0–0.2)

## 2020-08-28 LAB — COMPREHENSIVE METABOLIC PANEL
ALT: 19 U/L (ref 0–44)
AST: 24 U/L (ref 15–41)
Albumin: 3.6 g/dL (ref 3.5–5.0)
Alkaline Phosphatase: 69 U/L (ref 38–126)
Anion gap: 5 (ref 5–15)
BUN: 13 mg/dL (ref 8–23)
CO2: 28 mmol/L (ref 22–32)
Calcium: 8.5 mg/dL — ABNORMAL LOW (ref 8.9–10.3)
Chloride: 102 mmol/L (ref 98–111)
Creatinine, Ser: 1.03 mg/dL (ref 0.61–1.24)
GFR, Estimated: >60 mL/min (ref >60)
Glucose, Bld: 97 mg/dL (ref 70–99)
Potassium: 3.7 mmol/L (ref 3.5–5.1)
Sodium: 135 mmol/L (ref 135–145)
Total Bilirubin: 0.5 mg/dL (ref 0.3–1.2)
Total Protein: 6.7 g/dL (ref 6.5–8.1)

## 2020-08-28 LAB — TSH: TSH: 1.232 u[IU]/mL (ref 0.350–4.500)

## 2020-08-28 MED ORDER — SODIUM CHLORIDE 0.9 % IV SOLN: Freq: Once | INTRAVENOUS | Status: AC

## 2020-08-28 MED ORDER — SODIUM CHLORIDE 0.9% FLUSH
10.0000 mL | INTRAVENOUS | Status: DC | PRN
  Administered 2020-08-28: 10 mL

## 2020-08-28 MED ORDER — HEPARIN SOD (PORK) LOCK FLUSH 100 UNIT/ML IV SOLN
500.0000 [IU] | Freq: Once | INTRAVENOUS | Status: AC | PRN
  Administered 2020-08-28: 500 [IU]

## 2020-08-28 MED ORDER — SODIUM CHLORIDE 0.9 % IV SOLN
10.0000 mg/kg | Freq: Once | INTRAVENOUS | Status: AC
  Administered 2020-08-28: 740 mg via INTRAVENOUS
  Filled 2020-08-28: qty 4.8

## 2020-08-28 NOTE — Patient Instructions (Signed)
Alan [name]  Discharge Instructions: Thank you for choosing [facility] to provide your oncology and hematology care.  If you have a lab appointment with the [facility], please come in thru the Main Entrance and check in at the main information desk.  Wear comfortable clothing and clothing appropriate for easy access to any Portacath or PICC line.   We strive to give you quality time with your provider. You may need to reschedule your appointment if you arrive late (15 or more minutes).  Arriving late affects you and other patients whose appointments are after yours.  Also, if you miss three or more appointments without notifying the office, you may be dismissed from the clinic at the provider's discretion.      For prescription refill requests, have your pharmacy contact our office and allow 72 hours for refills to be completed.    Today you received the following chemotherapy and/or immunotherapy agents Imfinzi.   To help prevent nausea and vomiting after your treatment, we encourage you to take your nausea medication as directed.  BELOW ARE SYMPTOMS THAT SHOULD BE REPORTED IMMEDIATELY: *FEVER GREATER THAN 100.4 F (38 C) OR HIGHER *CHILLS OR SWEATING *NAUSEA AND VOMITING THAT IS NOT CONTROLLED WITH YOUR NAUSEA MEDICATION *UNUSUAL SHORTNESS OF BREATH *UNUSUAL BRUISING OR BLEEDING *URINARY PROBLEMS (pain or burning when urinating, or frequent urination) *BOWEL PROBLEMS (unusual diarrhea, constipation, pain near the anus) TENDERNESS IN MOUTH AND THROAT WITH OR WITHOUT PRESENCE OF ULCERS (sore throat, sores in mouth, or a toothache) UNUSUAL RASH, SWELLING OR PAIN  UNUSUAL VAGINAL DISCHARGE OR ITCHING   Items with * indicate a potential emergency and should be followed up as soon as possible or go to the Emergency Department if any problems should occur.  Please show the CHEMOTHERAPY ALERT CARD or IMMUNOTHERAPY ALERT CARD at check-in to the Emergency Department  and triage nurse.  Should you have questions after your visit or need to cancel or reschedule your appointment, please contact [facility] [phone]  and follow the prompts.  Office hours are 8:00 a.m. to 4:30 p.m. Monday - Friday. Please note that voicemails left after 4:00 p.m. may not be returned until the following business day.  We are closed weekends and major holidays. You have access to a nurse at all times for urgent questions. Please call the main number to the clinic [phone] and follow the prompts.  For any non-urgent questions, you may also contact your provider using MyChart. We now offer e-Visits for anyone 85 and older to request care online for non-urgent symptoms. For details visit [email or web address].   Also download the MyChart app! Go to the app store, search "MyChart", open the app, select [facility], and log in with your MyChart username and password.  Due to Covid, a mask is required upon entering the hospital/clinic. If you do not have a mask, one will be given to you upon arrival. For doctor visits, patients may have 1 support person aged 60 or older with them. For treatment visits, patients cannot have anyone with them due to current Covid guidelines and our immunocompromised population.   Durvalumab injection What is this medication? DURVALUMAB (dur VAL ue mab) is a monoclonal antibody. It is used to treat lungcancer. This medicine may be used for other purposes; ask your health care provider orpharmacist if you have questions. COMMON BRAND NAME(S): IMFINZI What should I tell my care team before I take this medication? They need to know if you  have any of these conditions: autoimmune diseases like Crohn's disease, ulcerative colitis, or lupus have had or planning to have an allogeneic stem cell transplant (uses someone else's stem cells) history of organ transplant history of radiation to the chest nervous system problems like myasthenia gravis  or Guillain-Barre syndrome an unusual or allergic reaction to durvalumab, other medicines, foods, dyes, or preservatives pregnant or trying to get pregnant breast-feeding How should I use this medication? This medicine is for infusion into a vein. It is given by a health careprofessional in a hospital or clinic setting. A special MedGuide will be given to you before each treatment. Be sure to readthis information carefully each time. Talk to your pediatrician regarding the use of this medicine in children.Special care may be needed. Overdosage: If you think you have taken too much of this medicine contact apoison control center or emergency room at once. NOTE: This medicine is only for you. Do not share this medicine with others. What if I miss a dose? It is important not to miss your dose. Call your doctor or health careprofessional if you are unable to keep an appointment. What may interact with this medication? Interactions have not been studied. This list may not describe all possible interactions. Give your health care provider a list of all the medicines, herbs, non-prescription drugs, or dietary supplements you use. Also tell them if you smoke, drink alcohol, or use illegaldrugs. Some items may interact with your medicine. What should I watch for while using this medication? This drug may make you feel generally unwell. Continue your course of treatmenteven though you feel ill unless your doctor tells you to stop. You may need blood work done while you are taking this medicine. Do not become pregnant while taking this medicine or for 3 months after stopping it. Women should inform their doctor if they wish to become pregnant or think they might be pregnant. There is a potential for serious side effects to an unborn child. Talk to your health care professional or pharmacist for more information. Do not breast-feed an infant while taking this medicine orfor 3 months after stopping it. What  side effects may I notice from receiving this medication? Side effects that you should report to your doctor or health care professionalas soon as possible: allergic reactions like skin rash, itching or hives, swelling of the face, lips, or tongue black, tarry stools bloody or watery diarrhea breathing problems change in emotions or moods change in sex drive changes in vision chest pain or chest tightness chills confusion cough facial flushing fever headache signs and symptoms of high blood sugar such as dizziness; dry mouth; dry skin; fruity breath; nausea; stomach pain; increased hunger or thirst; increased urination signs and symptoms of liver injury like dark yellow or Guinyard urine; general ill feeling or flu-like symptoms; light-colored stools; loss of appetite; nausea; right upper belly pain; unusually weak or tired; yellowing of the eyes or skin stomach pain trouble passing urine or change in the amount of urine weight gain or weight loss Side effects that usually do not require medical attention (report these toyour doctor or health care professional if they continue or are bothersome): bone pain constipation loss of appetite muscle pain nausea swelling of the ankles, feet, hands tiredness This list may not describe all possible side effects. Call your doctor for medical advice about side effects. You may report side effects to FDA at1-800-FDA-1088. Where should I keep my medication? This drug is given in a hospital or  clinic and will not be stored at home. NOTE: This sheet is a summary. It may not cover all possible information. If you have questions about this medicine, talk to your doctor, pharmacist, orhealth care provider.  2022 Elsevier/Gold Standard (2019-04-14 13:01:29)

## 2020-08-28 NOTE — Progress Notes (Signed)
Patients port flushed without difficulty.  Good blood return noted with no bruising or swelling noted at site.  Stable during access and blood draw.  Patient to remain accessed for treatment. 

## 2020-08-28 NOTE — Progress Notes (Signed)
Pt presents today for treatment. Pt is receiving Imfinzi today. Okay for treatment today per Dr. Delton Coombes labs WNL.  Imfinzi given today per MD orders. Tolerated infusion without adverse affects. Vital signs stable. No complaints at this time. Pt stable during and after treatment. Discharged from clinic ambulatory in stable condition. Alert and oriented x 3. F/U with [facility] as scheduled.

## 2020-08-28 NOTE — Patient Instructions (Addendum)
[place] at [facility] Discharge Instructions  You were seen today by Dr. Delton Coombes. He went over your recent results and scans. You will be scheduled for a CT scan of your chest and abdomen prior to your next visit. Dr. Delton Coombes will see you back in 2 weeks for labs and follow up.   Thank you for choosing [facility] at [facility] to provide your oncology and hematology care.  To afford each patient quality time with our provider, please arrive at least 15 minutes before your scheduled appointment time.   If you have a lab appointment with the [facility] please come in thru the Main Entrance and check in at the main information desk  You need to re-schedule your appointment should you arrive 10 or more minutes late.  We strive to give you quality time with our providers, and arriving late affects you and other patients whose appointments are after yours.  Also, if you no show three or more times for appointments you may be dismissed from the clinic at the providers discretion.     Again, thank you for choosing [facility].  Our hope is that these requests will decrease the amount of time that you wait before being seen by our physicians.       _____________________________________________________________  Should you have questions after your visit to [facility], please contact our office at [phone] between the hours of 8:00 a.m. and 4:30 p.m.  Voicemails left after 4:00 p.m. will not be returned until the following business day.  For prescription refill requests, have your pharmacy contact our office and allow 72 hours.    Cancer Center Support Programs:   > Cancer Support Group  2nd Tuesday of the month 1pm-2pm, Journey Room

## 2020-08-28 NOTE — Progress Notes (Signed)
Patient has been assessed, vital signs and labs have been reviewed by Dr. Katragadda. ANC, Creatinine, LFTs, and Platelets are within treatment parameters per Dr. Katragadda. The patient is good to proceed with treatment at this time. Primary RN and pharmacy aware.  

## 2020-09-05 ENCOUNTER — Other Ambulatory Visit: Payer: Self-pay

## 2020-09-05 ENCOUNTER — Ambulatory Visit (HOSPITAL_COMMUNITY)
Admission: RE | Admit: 2020-09-05 | Discharge: 2020-09-05 | Disposition: A | Discharge status: Home / Self Care | Payer: Medicare Other | Source: Ambulatory Visit | Attending: Hematology | Admitting: Hematology

## 2020-09-05 DIAGNOSIS — C3492 Malignant neoplasm of unspecified part of left bronchus or lung: Secondary | ICD-10-CM

## 2020-09-05 MED ORDER — IOHEXOL 300 MG/ML  SOLN
100.0000 mL | Freq: Once | INTRAMUSCULAR | Status: AC | PRN
  Administered 2020-09-05: 100 mL via INTRAVENOUS

## 2020-09-10 NOTE — Progress Notes (Signed)
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  PRIOR THERAPY: Concurrent chemoradiation with weekly carboplatin and paclitaxel x 6 cycles from 11/28/2019 to 01/02/2020  NGS Results: not done  CURRENT THERAPY: Durvalumab every 2 weeks  BRIEF ONCOLOGIC HISTORY:  Oncology History  Squamous cell lung cancer, left ([place])  11/08/2019 Initial Diagnosis   Squamous cell lung cancer, left ([place])    11/08/2019 Cancer Staging   Staging form: Lung, AJCC 8th Edition - Clinical stage from 11/08/2019: Stage IIIB (cT4, cN2, cM0) - Signed by Derek Jack, MD on 11/08/2019    11/28/2019 - 01/02/2020 Chemotherapy   The patient had palonosetron (ALOXI) injection 0.25 mg, 0.25 mg, Intravenous,  Once, 6 of 6 cycles Administration: 0.25 mg (11/28/2019), 0.25 mg (12/05/2019), 0.25 mg (12/12/2019), 0.25 mg (12/19/2019), 0.25 mg (12/26/2019), 0.25 mg (01/02/2020) CARBOplatin (PARAPLATIN) 180 mg in sodium chloride 0.9 % 250 mL chemo infusion, 180 mg (100 % of original dose 176 mg), Intravenous,  Once, 6 of 6 cycles Dose modification:   (original dose 176 mg, Cycle 1),   (original dose 201.2 mg, Cycle 2),   (original dose 179.2 mg, Cycle 3),   (original dose 179.2 mg, Cycle 4),   (original dose 195.4 mg, Cycle 5),   (original dose 178.2 mg, Cycle 6) Administration: 180 mg (11/28/2019), 200 mg (12/05/2019), 180 mg (12/12/2019), 180 mg (12/19/2019), 190 mg (12/26/2019), 180 mg (01/02/2020) PACLitaxel (TAXOL) 84 mg in sodium chloride 0.9 % 250 mL chemo infusion (</= 80mg /m2), 45 mg/m2 = 84 mg, Intravenous,  Once, 6 of 6 cycles Administration: 84 mg (11/28/2019), 84 mg (12/05/2019), 84 mg (12/12/2019), 84 mg (12/19/2019), 84 mg (12/26/2019), 84 mg (01/02/2020)   for chemotherapy treatment.      01/30/2020 -  Chemotherapy    Patient is on Treatment Plan: LUNG DURVALUMAB Q14D         CANCER STAGING: Cancer Staging Squamous cell lung cancer, left ([place]) Staging form: Lung, AJCC 8th Edition - Clinical stage from 11/08/2019: Stage IIIB (cT4, cN2, cM0) - Signed by Derek Jack, MD on 11/08/2019  Virtual Visit via Video Note  I connected with [name] on @TODAY @ at  9:45 AM EDT by a video enabled telemedicine application and verified that I am speaking with the correct person using two identifiers.  Location: Patient: clinic Provider: home  Others present: Satira Anis, LPN   I discussed the limitations of evaluation and management by telemedicine and the availability of in person appointments. The patient expressed understanding and agreed to proceed.  INTERVAL HISTORY:  Mr. ZAMAURI NEZ, a 70 y.o. male, returns for routine follow-up and consideration for next cycle of chemotherapy. Gradie was last seen on 07/10/20.  Due for cycle #15 of Durvalumab today.   Overall, he tells me he has been feeling pretty well. He reports a baseline cough and has lost 4 lbs since his last visit. His appetite is at baseline, and he has not been drinking Boost/Ensure. He is still taking Marinol, but he admits to not taking it consistently. The pain in his left upper abdomen has resolved. He denies any recent infections, trouble swallowing, worsened SOB, diarrhea, skin rash, or changes in his activity levels. He reports low energy levels.  Overall, he feels ready for next cycle of chemo today.  REVIEW OF SYSTEMS:  Review of Systems  Constitutional:  Positive for appetite change (75%) and fatigue (25%).  HENT:   Negative for trouble swallowing.   Respiratory:  Positive for cough (stable). Negative for shortness of breath.   Gastrointestinal:  Negative for diarrhea.  Skin:  Negative for rash.  Psychiatric/Behavioral:  Positive for sleep disturbance. The patient is  nervous/anxious.   All other systems reviewed and are negative.  PAST MEDICAL/SURGICAL HISTORY:  Past Medical History:  Diagnosis Date   Arthritis    Hyperlipemia    Hypertension    Hyperthyroidism    Lung cancer ([place])    Myocardial infarction ([place])    Paroxysmal atrial fibrillation ([place])    Port-A-Cath in place 11/14/2019   Tachycardia    Past Surgical History:  Procedure Laterality Date   CARDIAC CATHETERIZATION     PORTACATH PLACEMENT Right 11/01/2019   Procedure: INSERTION PORT-A-CATH;  Surgeon: Aviva Signs, MD;  Location: [facility];  Service: General;  Laterality: Right;    SOCIAL HISTORY:  Social History   Socioeconomic History   Marital status: Married    Spouse name: Not on file   Number of children: 1   Years of education: Not on file   Highest education level: Not on file  Occupational History   Occupation: retired   Occupation: Glass blower/designer: [employer]  Tobacco Use   Smoking status: Former    Years: 45.00    Types: Cigars, Cigarettes    Quit date: 10/19/2014    Years since quitting: 5.8   Smokeless tobacco: Never  Vaping Use   Vaping Use: Never used  Substance and Sexual Activity   Alcohol use: Never   Drug use: No   Sexual activity: Not on file  Other Topics Concern   Not on file  Social History Narrative   Not on file   Social Determinants of Health   Financial Resource Strain: Medium Risk   Difficulty of Paying Living Expenses: Somewhat hard  Food Insecurity: No Food Insecurity   Worried About Charity fundraiser in the Last Year: Never true   [place] in the Last Year: Never true  Transportation Needs: No Transportation Needs   Lack of Transportation (Medical): No   Lack of Transportation (Non-Medical): No  Physical Activity: Inactive   Days of Exercise per Week: 0 days   Minutes of Exercise per Session: 0 min  Stress: Stress Concern Present   Feeling of Stress : Very much  Social Connections: Moderately Isolated   Frequency  of Communication with Friends and Family: More than three times a week   Frequency of Social Gatherings with Friends and Family: More than three times a week   Attends Religious Services: Never   Marine scientist or Organizations: No   Attends Music therapist: Never   Marital Status: Married  Human resources officer Violence: Not At Risk   Fear of Current or Ex-Partner: No   Emotionally Abused: No   Physically Abused: No   Sexually Abused: No    FAMILY HISTORY:  Family History  Problem Relation Age of Onset   Hypertension Father    Dementia Father        died age 39   Heart disease Father    Heart failure Other     CURRENT MEDICATIONS:  Current Outpatient Medications  Medication Sig Dispense Refill   acetaminophen (TYLENOL) 500 MG tablet Take 500 mg by mouth every 6 (six) hours as  needed for moderate pain or headache.     albuterol (PROVENTIL) (2.5 MG/3ML) 0.083% nebulizer solution Take 3 mLs (2.5 mg total) by nebulization every 6 (six) hours as needed for wheezing or shortness of breath. 75 mL 12   ALPRAZolam (XANAX) 0.25 MG tablet TAKE 1 TABLET BY MOUTH THREE TIMES DAILY AS NEEDED FOR ANXIETY 30 tablet 0   diazepam (VALIUM) 2 MG tablet Take 2 mg by mouth 2 (two) times daily.     dronabinol (MARINOL) 5 MG capsule Take 1 capsule (5 mg total) by mouth 2 (two) times daily before a meal. 60 capsule 2   Durvalumab (IMFINZI IV) Inject into the vein.     ELIQUIS 5 MG TABS tablet Take 1 tablet by mouth twice daily 60 tablet 5   HYCODAN 5-1.5 MG/5ML syrup take 15 mls BY MOUTH EVERY 8 HOURS AS NEEDED FOR cough 473 mL 0   levothyroxine (SYNTHROID) 100 MCG tablet Take 1 tablet (100 mcg total) by mouth daily before breakfast. 30 tablet 3   lidocaine (XYLOCAINE) 2 % solution TAKE 15 ML BY MOUTH  4 TIMES DAILY WITH MEALS 480 mL 0   lidocaine-prilocaine (EMLA) cream Apply 1 application topically as needed.     Melatonin 10 MG TABS Take 1 tablet by mouth at bedtime.     metoprolol  tartrate (LOPRESSOR) 25 MG tablet Take one tablet(25 mg )  in the AM and take 1/2 Tablet (12.5mg ) in the PM 135 tablet 3   No current facility-administered medications for this visit.    ALLERGIES:  No Known Allergies  Performance status (ECOG): 1 - Symptomatic but completely ambulatory  There were no vitals filed for this visit. Wt Readings from Last 3 Encounters:  08/28/20 168 lb 3.2 oz (76.3 kg)  08/14/20 168 lb 6.4 oz (76.4 kg)  07/24/20 169 lb 12.8 oz (77 kg)    LABORATORY DATA:  I have reviewed the labs as listed.  CBC Latest Ref Rng & Units 08/28/2020 08/14/2020 07/24/2020  WBC 4.0 - 10.5 K/uL 6.7 5.1 6.2  Hemoglobin 13.0 - 17.0 g/dL 12.4(L) 12.0(L) 11.7(L)  Hematocrit 39.0 - 52.0 % 37.0(L) 37.0(L) 36.3(L)  Platelets 150 - 400 K/uL 134(L) 127(L) 113(L)   CMP Latest Ref Rng & Units 08/28/2020 08/14/2020 07/24/2020  Glucose 70 - 99 mg/dL 97 89 124(H)  BUN 8 - 23 mg/dL 13 18 14   Creatinine 0.61 - 1.24 mg/dL 1.03 1.18 1.04  Sodium 135 - 145 mmol/L 135 138 136  Potassium 3.5 - 5.1 mmol/L 3.7 3.9 4.0  Chloride 98 - 111 mmol/L 102 104 104  CO2 22 - 32 mmol/L 28 27 25   Calcium 8.9 - 10.3 mg/dL 8.5(L) 8.7(L) 8.8(L)  Total Protein 6.5 - 8.1 g/dL 6.7 6.7 6.6  Total Bilirubin 0.3 - 1.2 mg/dL 0.5 0.4 0.4  Alkaline Phos 38 - 126 U/L 69 59 63  AST 15 - 41 U/L 24 24 27   ALT 0 - 44 U/L 19 17 22     DIAGNOSTIC IMAGING:  I have independently reviewed the scans and discussed with the patient. CT CHEST ABDOMEN PELVIS W CONTRAST  Result Date: 09/06/2020 CLINICAL DATA:  New LEFT-sided pain. Lung cancer. Squamous cell carcinoma diagnosed September 2021. Radiation completed. EXAM: CT CHEST, ABDOMEN, AND PELVIS WITH CONTRAST TECHNIQUE: Multidetector CT imaging of the chest, abdomen and pelvis was performed following the standard protocol during bolus administration of intravenous contrast. CONTRAST:  121mL OMNIPAQUE IOHEXOL 300 MG/ML  SOLN COMPARISON:  07/20/2020, CT FINDINGS: CT CHEST FINDINGS  Cardiovascular: Port in the anterior chest wall with tip in distal SVC. Truncation of the LEFT upper lobe pulmonary artery related to consolidation. Small pericardial effusion adjacent to the LEFT ventricle unchanged. Mediastinum/Nodes: No axillary or supraclavicular adenopathy. There is fullness in the AP window which appears to enhance measuring 2.2 cm image 18/2. There is enhancing nodule within the anterior LEFT hilum measuring 14 mm (23/series 2) not evident on comparison CT which was noncontrast. Lesion not evident on CT of 01/20/2020 which was contrast enhanced Lungs/Pleura: Continue consolidation in LEFT upper lobe not significantly changed. The nodule of concern in the LEFT lower lobe measures 7 mm (image 23/2) not changed from 7 mm on comparison exam. Mild atelectasis at the LEFT lung base unchanged. Perihilar radiation change in the superior LEFT lower lobe is stable. Musculoskeletal: No aggressive osseous lesion. CT ABDOMEN AND PELVIS FINDINGS Hepatobiliary: No focal hepatic lesion. No biliary ductal dilatation. Gallbladder is normal. Common bile duct is normal. Pancreas: Pancreas is normal. No ductal dilatation. No pancreatic inflammation. Spleen: Normal spleen Adrenals/urinary tract: Adrenal glands and kidneys are normal. The ureters and bladder normal. Stomach/Bowel: Stomach, small bowel, appendix, and cecum are normal. The colon and rectosigmoid colon are normal. Vascular/Lymphatic: Abdominal aorta is normal caliber. There is no retroperitoneal or periportal lymphadenopathy. No pelvic lymphadenopathy. Reproductive: Unremarkable Other: No peritoneal metastasis. Musculoskeletal: No aggressive osseous lesion. IMPRESSION: Chest Impression: 1. New enhancing nodule in the anterior aspect of the LEFT hilum is highly concerning for lung cancer recurrence. 2. Enhancing tissue in the AP window at site of prior hypermetabolic infiltrative mass on comparison PET-CT scan 2021. Recommend repeat PET-CT scan for  evaluation of the LEFT hilum and AP window. 3. The nodule concern in LEFT lower lobe is unchanged in size. Recommend evaluation with PET-CT scan above. 4. Persistent dense consolidation in the LEFT upper lobe with extension to the LEFT hilum. Abdomen / Pelvis Impression: 1.  No evidence of metastatic disease in the abdomen pelvis. 2.  No evidence of skeletal metastasis. Electronically Signed   By: Suzy Bouchard M.D.   On: 09/06/2020 16:13     ASSESSMENT:  1.  Left lung cancer: -Presentation to the ER on 10/06/2019 with cough and dizziness. -CT chest with contrast on 10/06/2019 showed large left suprahilar mass completely obstructing left upper lobe bronchus, measuring 7 x 5.3 x 5.8 cm.  Mass extends into AP window and narrows the left mainstem bronchus.  Small subpleural nodule in the left lower lobe measuring 4 mm indeterminate.  AP window nodal mass measuring 3.4 cm.  No supraclavicular adenopathy.  Subcarinal enlarged lymph node measures 1.4 cm. -PET scan on 10/14/2019 shows large hypermetabolic left upper lobe mass surrounding the left hilum, extending into pleural surface not invading the chest wall.  Direct invasion into the AP window and metastatic subcarinal lymph node, T4 N2 M0.  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 -10 pound weight loss in the last 6 months.  Hoarseness for last 1 week. -MRI of the brain on 10/28/2019 with no evidence of metastasis. -Concurrent chemoradiation therapy started on 11/28/2019. Completed 01/02/20 ( chemo) 01/06/2020(radiation) -CT chest on 01/20/2020 showed significant interval decrease in consolidation of the left upper lobe, interval decrease in irregular nodularity and consolidation of the left lower lobe.  Unchanged posttreatment appearance of soft tissue about the left hilum and AP window.  No discretely enlarged mediastinal or hilar lymph nodes.  Trace left pleural effusion, decreased compared to prior  exam. -Consolidation durvalumab started on 01/30/2020. -CT chest with contrast on 03/30/2020 with the development of small left-sided pleural effusion with further volume loss.  Bandlike scarring in the left lower lobe since prior study likely posttreatment changes.  Small bone lesion at T1 stable.  1 small nodule in the right chest which is new about 4 mm.  Will monitor.   2.  Social/family history: -He smoked 1 pack/day for 45 years, quit 5 years ago. -No family history of malignancies.   PLAN:  1.  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  No hemoptysis reported. - We have reviewed CT CAP from 09/05/2020.  Left lower lobe lung nodule is stable at 7 mm, which was new on last CT chest from June. - There is enhancing tissue fullness in the AP window measuring 2.2 cm.  This is in the same area of his prior malignancy when I compared to PET scan from diagnosis.  There is also suspicious left hilar nodule measuring 1.4 cm which is new. - He no longer has left upper quadrant pain.  CT of the abdomen and pelvis was negative for any suspicious findings. - He does not have any immunotherapy related side effects.  LFTs and CBC are within normal limits today. - Based on the new nonspecific findings, I have recommended PET CT scan. - He will proceed with his treatment today and RTC in 2 weeks.  He will have PET scan done prior to next visit.  If the PET scan lights up in these regions, we will consider bronchoscopy and biopsy.   2.  Cough: -He is not requiring Hycodan on regular basis.  He also uses over-the-counter cough medicine which helps.  No change in the cough in the last 2 months.  No recent infections.   3.  Atrial fibrillation: -Continue Eliquis 5 mg twice daily.  No bleeding issues reported.   4.  Weight loss: -His wife reports that he is missing doses of Marinol.  He is supposed to take  Marinol 5 mg twice daily.  He often misses doses.  Marinol helps with his appetite.  He lost about 4 pounds in the last 2 weeks. - I have asked him to not miss any doses.   5.  Hypertension: -Continue metoprolol 1 tablet in the morning and half tablet in the evening.  Blood pressure today is 108/66.   6.  Hypothyroidism: -Continue Synthroid 100 mcg daily.  TSH is 1.232.   Orders placed this encounter:  No orders of the defined types were placed in this encounter.  I provided 30 minutes of non-face-to-face time during this encounter.  Derek Jack, MD [facility] [phone]   I, Thana Ates, am acting as a scribe for Dr. Derek Jack.  I, Derek Jack MD, have reviewed the above documentation for accuracy and completeness, and I agree with the above.

## 2020-09-11 ENCOUNTER — Encounter (HOSPITAL_COMMUNITY): Payer: Self-pay | Admitting: Hematology

## 2020-09-11 ENCOUNTER — Inpatient Hospital Stay (HOSPITAL_BASED_OUTPATIENT_CLINIC_OR_DEPARTMENT_OTHER): Payer: Medicare Other | Admitting: Hematology

## 2020-09-11 ENCOUNTER — Inpatient Hospital Stay (HOSPITAL_COMMUNITY): Payer: Medicare Other

## 2020-09-11 ENCOUNTER — Other Ambulatory Visit: Payer: Self-pay

## 2020-09-11 VITALS — BP 123/81 | HR 72 | Temp 97.9°F | Resp 18

## 2020-09-11 DIAGNOSIS — C3492 Malignant neoplasm of unspecified part of left bronchus or lung: Secondary | ICD-10-CM

## 2020-09-11 DIAGNOSIS — Z95828 Presence of other vascular implants and grafts: Secondary | ICD-10-CM

## 2020-09-11 DIAGNOSIS — Z5112 Encounter for antineoplastic immunotherapy: Secondary | ICD-10-CM | POA: Diagnosis not present

## 2020-09-11 LAB — CBC WITH DIFFERENTIAL/PLATELET
Abs Immature Granulocytes: 0.03 10*3/uL (ref 0.00–0.07)
Basophils Absolute: 0.1 10*3/uL (ref 0.0–0.1)
Basophils Relative: 1 %
Eosinophils Absolute: 0.2 10*3/uL (ref 0.0–0.5)
Eosinophils Relative: 3 %
HCT: 39.7 % (ref 39.0–52.0)
Hemoglobin: 13.3 g/dL (ref 13.0–17.0)
Immature Granulocytes: 0 %
Lymphocytes Relative: 8 %
Lymphs Abs: 0.6 10*3/uL — ABNORMAL LOW (ref 0.7–4.0)
MCH: 32.2 pg (ref 26.0–34.0)
MCHC: 33.5 g/dL (ref 30.0–36.0)
MCV: 96.1 fL (ref 80.0–100.0)
Monocytes Absolute: 0.7 10*3/uL (ref 0.1–1.0)
Monocytes Relative: 9 %
Neutro Abs: 6 10*3/uL (ref 1.7–7.7)
Neutrophils Relative %: 79 %
Platelets: 138 10*3/uL — ABNORMAL LOW (ref 150–400)
RBC: 4.13 MIL/uL — ABNORMAL LOW (ref 4.22–5.81)
RDW: 13.2 % (ref 11.5–15.5)
WBC: 7.5 10*3/uL (ref 4.0–10.5)
nRBC: 0 % (ref 0.0–0.2)

## 2020-09-11 LAB — COMPREHENSIVE METABOLIC PANEL
ALT: 17 U/L (ref 0–44)
AST: 22 U/L (ref 15–41)
Albumin: 3.9 g/dL (ref 3.5–5.0)
Alkaline Phosphatase: 72 U/L (ref 38–126)
Anion gap: 6 (ref 5–15)
BUN: 15 mg/dL (ref 8–23)
CO2: 27 mmol/L (ref 22–32)
Calcium: 9 mg/dL (ref 8.9–10.3)
Chloride: 102 mmol/L (ref 98–111)
Creatinine, Ser: 1.07 mg/dL (ref 0.61–1.24)
GFR, Estimated: >60 mL/min (ref >60)
Glucose, Bld: 96 mg/dL (ref 70–99)
Potassium: 4.4 mmol/L (ref 3.5–5.1)
Sodium: 135 mmol/L (ref 135–145)
Total Bilirubin: 0.7 mg/dL (ref 0.3–1.2)
Total Protein: 7.2 g/dL (ref 6.5–8.1)

## 2020-09-11 LAB — TSH: TSH: 1.063 u[IU]/mL (ref 0.350–4.500)

## 2020-09-11 MED ORDER — SODIUM CHLORIDE 0.9 % IV SOLN: Freq: Once | INTRAVENOUS | Status: AC

## 2020-09-11 MED ORDER — SODIUM CHLORIDE 0.9 % IV SOLN
10.0000 mg/kg | Freq: Once | INTRAVENOUS | Status: AC
  Administered 2020-09-11: 740 mg via INTRAVENOUS
  Filled 2020-09-11: qty 4.8

## 2020-09-11 MED ORDER — SODIUM CHLORIDE 0.9% FLUSH
10.0000 mL | INTRAVENOUS | Status: DC | PRN
  Administered 2020-09-11: 10 mL

## 2020-09-11 MED ORDER — HEPARIN SOD (PORK) LOCK FLUSH 100 UNIT/ML IV SOLN
500.0000 [IU] | Freq: Once | INTRAVENOUS | Status: AC | PRN
  Administered 2020-09-11: 500 [IU]

## 2020-09-11 NOTE — Progress Notes (Signed)
Patient has been assessed, vital signs and labs have been reviewed by Dr. Katragadda. ANC, Creatinine, LFTs, and Platelets are within treatment parameters per Dr. Katragadda. The patient is good to proceed with treatment at this time. Primary RN and pharmacy aware.  

## 2020-09-11 NOTE — Patient Instructions (Signed)
[facility]  Discharge Instructions: Thank you for choosing [facility] to provide your oncology and hematology care.  If you have a lab appointment with the [facility], please come in thru the Main Entrance and check in at the main information desk.  Wear comfortable clothing and clothing appropriate for easy access to any Portacath or PICC line.   We strive to give you quality time with your provider. You may need to reschedule your appointment if you arrive late (15 or more minutes).  Arriving late affects you and other patients whose appointments are after yours.  Also, if you miss three or more appointments without notifying the office, you may be dismissed from the clinic at the provider's discretion.      For prescription refill requests, have your pharmacy contact our office and allow 72 hours for refills to be completed.    Today you received the following chemotherapy and/or immunotherapy agents Imfinzi, return as scheduled. To help prevent nausea and vomiting after your treatment, we encourage you to take your nausea medication as directed.  BELOW ARE SYMPTOMS THAT SHOULD BE REPORTED IMMEDIATELY: *FEVER GREATER THAN 100.4 F (38 C) OR HIGHER *CHILLS OR SWEATING *NAUSEA AND VOMITING THAT IS NOT CONTROLLED WITH YOUR NAUSEA MEDICATION *UNUSUAL SHORTNESS OF BREATH *UNUSUAL BRUISING OR BLEEDING *URINARY PROBLEMS (pain or burning when urinating, or frequent urination) *BOWEL PROBLEMS (unusual diarrhea, constipation, pain near the anus) TENDERNESS IN MOUTH AND THROAT WITH OR WITHOUT PRESENCE OF ULCERS (sore throat, sores in mouth, or a toothache) UNUSUAL RASH, SWELLING OR PAIN  UNUSUAL VAGINAL DISCHARGE OR ITCHING   Items with * indicate a potential emergency and should be followed up as soon as possible or go to the Emergency Department if any problems should occur.  Please show the CHEMOTHERAPY ALERT CARD or IMMUNOTHERAPY ALERT CARD at check-in to the  Emergency Department and triage nurse.  Should you have questions after your visit or need to cancel or reschedule your appointment, please contact [facility] [phone]  and follow the prompts.  Office hours are 8:00 a.m. to 4:30 p.m. Monday - Friday. Please note that voicemails left after 4:00 p.m. may not be returned until the following business day.  We are closed weekends and major holidays. You have access to a nurse at all times for urgent questions. Please call the main number to the clinic [phone] and follow the prompts.  For any non-urgent questions, you may also contact your provider using MyChart. We now offer e-Visits for anyone 63 and older to request care online for non-urgent symptoms. For details visit [email or web address].   Also download the MyChart app! Go to the app store, search "MyChart", open the app, select , and log in with your MyChart username and password.  Due to Covid, a mask is required upon entering the hospital/clinic. If you do not have a mask, one will be given to you upon arrival. For doctor visits, patients may have 1 support person aged 1 or older with them. For treatment visits, patients cannot have anyone with them due to current Covid guidelines and our immunocompromised population.

## 2020-09-11 NOTE — Progress Notes (Signed)
Patients port flushed without difficulty.  Good blood return noted with no bruising or swelling noted at site.  Stable during access and blood draw.  To remain accessed for treatment.

## 2020-09-11 NOTE — Progress Notes (Signed)
Patient tolerated chemotherapy with no complaints voiced. Side effects with management reviewed understanding verbalized. Port site clean and dry with no bruising or swelling noted at site. Good blood return noted before and after administration of chemotherapy. Band aid applied. Patient left in satisfactory condition with VSS and no s/s of distress noted. 

## 2020-09-11 NOTE — Patient Instructions (Addendum)
[facility] at [facility] Discharge Instructions  You were seen today by Dr. Delton Coombes. He went over your recent results and scans, and you received your treatment. You will be scheduled for a PET scan prior to your next appointment. Dr. Delton Coombes will see you back in 2 weeks for labs and follow up.   Thank you for choosing [facility] at [facility] to provide your oncology and hematology care.  To afford each patient quality time with our provider, please arrive at least 15 minutes before your scheduled appointment time.   If you have a lab appointment with the [facility] please come in thru the Main Entrance and check in at the main information desk  You need to re-schedule your appointment should you arrive 10 or more minutes late.  We strive to give you quality time with our providers, and arriving late affects you and other patients whose appointments are after yours.  Also, if you no show three or more times for appointments you may be dismissed from the clinic at the providers discretion.     Again, thank you for choosing [facility].  Our hope is that these requests will decrease the amount of time that you wait before being seen by our physicians.       _____________________________________________________________  Should you have questions after your visit to [facility], please contact our office at [phone] between the hours of 8:00 a.m. and 4:30 p.m.  Voicemails left after 4:00 p.m. will not be returned until the following business day.  For prescription refill requests, have your pharmacy contact our office and allow 72 hours.    Cancer Center Support Programs:   > Cancer Support Group  2nd Tuesday of the month 1pm-2pm, Journey Room

## 2020-09-20 ENCOUNTER — Encounter (HOSPITAL_COMMUNITY)
Admission: RE | Admit: 2020-09-20 | Discharge: 2020-09-20 | Disposition: A | Discharge status: Home / Self Care | Payer: Medicare Other | Source: Ambulatory Visit | Attending: Hematology | Admitting: Hematology

## 2020-09-20 ENCOUNTER — Other Ambulatory Visit: Payer: Self-pay

## 2020-09-20 DIAGNOSIS — C3492 Malignant neoplasm of unspecified part of left bronchus or lung: Secondary | ICD-10-CM

## 2020-09-20 MED ORDER — FLUDEOXYGLUCOSE F - 18 (FDG) INJECTION
8.5500 | Freq: Once | INTRAVENOUS | Status: AC | PRN
  Administered 2020-09-20: 8.55 via INTRAVENOUS

## 2020-09-24 ENCOUNTER — Other Ambulatory Visit (HOSPITAL_COMMUNITY): Payer: Self-pay

## 2020-09-24 DIAGNOSIS — C3492 Malignant neoplasm of unspecified part of left bronchus or lung: Secondary | ICD-10-CM

## 2020-09-24 NOTE — Progress Notes (Signed)
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  PRIOR THERAPY: Concurrent chemoradiation with weekly carboplatin and paclitaxel x 6 cycles from 11/28/2019 to 01/02/2020  NGS Results: not done  CURRENT THERAPY: Durvalumab every 2 weeks  BRIEF ONCOLOGIC HISTORY:  Oncology History  Squamous cell lung cancer, left ([place])  11/08/2019 Initial Diagnosis   Squamous cell lung cancer, left ([place])    11/08/2019 Cancer Staging   Staging form: Lung, AJCC 8th Edition - Clinical stage from 11/08/2019: Stage IIIB (cT4, cN2, cM0) - Signed by Derek Jack, MD on 11/08/2019    11/28/2019 - 01/02/2020 Chemotherapy   The patient had palonosetron (ALOXI) injection 0.25 mg, 0.25 mg, Intravenous,  Once, 6 of 6 cycles Administration: 0.25 mg (11/28/2019), 0.25 mg (12/05/2019), 0.25 mg (12/12/2019), 0.25 mg (12/19/2019), 0.25 mg (12/26/2019), 0.25 mg (01/02/2020) CARBOplatin (PARAPLATIN) 180 mg in sodium chloride 0.9 % 250 mL chemo infusion, 180 mg (100 % of original dose 176 mg), Intravenous,  Once, 6 of 6 cycles Dose modification:   (original dose 176 mg, Cycle 1),   (original dose 201.2 mg, Cycle 2),   (original dose 179.2 mg, Cycle 3),   (original dose 179.2 mg, Cycle 4),   (original dose 195.4 mg, Cycle 5),   (original dose 178.2 mg, Cycle 6) Administration: 180 mg (11/28/2019), 200 mg (12/05/2019), 180 mg (12/12/2019), 180 mg (12/19/2019), 190 mg (12/26/2019), 180 mg (01/02/2020) PACLitaxel (TAXOL) 84 mg in sodium chloride 0.9 % 250 mL chemo infusion (</= 80mg /m2), 45 mg/m2 = 84 mg, Intravenous,  Once, 6 of 6 cycles Administration: 84 mg (11/28/2019), 84 mg (12/05/2019), 84 mg (12/12/2019), 84 mg (12/19/2019), 84 mg (12/26/2019), 84 mg (01/02/2020)   for chemotherapy treatment.      01/30/2020 -  Chemotherapy    Patient is on Treatment Plan: LUNG DURVALUMAB Q14D         CANCER STAGING: Cancer Staging Squamous cell lung cancer, left ([place]) Staging form: Lung, AJCC 8th Edition - Clinical stage from 11/08/2019: Stage IIIB (cT4, cN2, cM0) - Signed by Derek Jack, MD on 11/08/2019   INTERVAL HISTORY:  [name], a 70 y.o. male, returns for routine follow-up and consideration for next cycle of immunotherapy. Desiree was last seen on 09/11/20.  Due for cycle #16 of Durvalumab today.   Overall, he tells me he has been feeling pretty well. He reports stable SOB and severe fatigue. He denies vision changes and headaches,   Overall, he does not feel ready for next cycle of immunotherapy today.   REVIEW OF SYSTEMS:  Review of Systems  Constitutional:  Positive for appetite change (40%) and fatigue (depleted).  Eyes:  Negative for eye problems.  Respiratory:  Positive for shortness of breath.   Neurological:  Positive for numbness (R hand tingling). Negative for headaches.  Psychiatric/Behavioral:  Positive for sleep disturbance (falling/staying).   All other systems reviewed and are negative.  PAST MEDICAL/SURGICAL HISTORY:  Past Medical History:  Diagnosis Date   Arthritis    Hyperlipemia    Hypertension    Hyperthyroidism    Lung cancer ([place])    Myocardial infarction ([place])    Paroxysmal atrial fibrillation ([place])    Port-A-Cath in place 11/14/2019   Tachycardia    Past Surgical History:  Procedure Laterality Date   CARDIAC  CATHETERIZATION     PORTACATH PLACEMENT Right 11/01/2019   Procedure: INSERTION PORT-A-CATH;  Surgeon: Aviva Signs, MD;  Location: [facility];  Service: General;  Laterality: Right;    SOCIAL HISTORY:  Social History   Socioeconomic History   Marital status: Married    Spouse name: Not on file   Number of children: 1   Years of education: Not on file   Highest education level: Not on file  Occupational History    Occupation: retired   Occupation: Glass blower/designer: [employer]  Tobacco Use   Smoking status: Former    Years: 45.00    Types: Cigars, Cigarettes    Quit date: 10/19/2014    Years since quitting: 5.9   Smokeless tobacco: Never  Vaping Use   Vaping Use: Never used  Substance and Sexual Activity   Alcohol use: Never   Drug use: No   Sexual activity: Not on file  Other Topics Concern   Not on file  Social History Narrative   Not on file   Social Determinants of Health   Financial Resource Strain: Medium Risk   Difficulty of Paying Living Expenses: Somewhat hard  Food Insecurity: No Food Insecurity   Worried About Charity fundraiser in the Last Year: Never true   [place] in the Last Year: Never true  Transportation Needs: No Transportation Needs   Lack of Transportation (Medical): No   Lack of Transportation (Non-Medical): No  Physical Activity: Inactive   Days of Exercise per Week: 0 days   Minutes of Exercise per Session: 0 min  Stress: Stress Concern Present   Feeling of Stress : Very much  Social Connections: Moderately Isolated   Frequency of Communication with Friends and Family: More than three times a week   Frequency of Social Gatherings with Friends and Family: More than three times a week   Attends Religious Services: Never   Marine scientist or Organizations: No   Attends Music therapist: Never   Marital Status: Married  Human resources officer Violence: Not At Risk   Fear of Current or Ex-Partner: No   Emotionally Abused: No   Physically Abused: No   Sexually Abused: No    FAMILY HISTORY:  Family History  Problem Relation Age of Onset   Hypertension Father    Dementia Father        died age 43   Heart disease Father    Heart failure Other     CURRENT MEDICATIONS:  Current Outpatient Medications  Medication Sig Dispense Refill   acetaminophen (TYLENOL) 500 MG tablet Take 500 mg by mouth every 6 (six) hours as needed for  moderate pain or headache.     albuterol (PROVENTIL) (2.5 MG/3ML) 0.083% nebulizer solution Take 3 mLs (2.5 mg total) by nebulization every 6 (six) hours as needed for wheezing or shortness of breath. 75 mL 12   ALPRAZolam (XANAX) 0.25 MG tablet TAKE 1 TABLET BY MOUTH THREE TIMES DAILY AS NEEDED FOR ANXIETY 30 tablet 0   dronabinol (MARINOL) 5 MG capsule Take 1 capsule (5 mg total) by mouth 2 (two) times daily before a meal. 60 capsule 2   Durvalumab (IMFINZI IV) Inject into the vein.     ELIQUIS 5 MG TABS tablet Take 1 tablet by mouth twice daily 60 tablet 5   HYCODAN 5-1.5 MG/5ML syrup take 15 mls BY MOUTH EVERY 8 HOURS AS NEEDED FOR cough 473 mL 0   levothyroxine (SYNTHROID)  100 MCG tablet Take 1 tablet (100 mcg total) by mouth daily before breakfast. 30 tablet 3   lidocaine (XYLOCAINE) 2 % solution TAKE 15 ML BY MOUTH  4 TIMES DAILY WITH MEALS 480 mL 0   lidocaine-prilocaine (EMLA) cream Apply 1 application topically as needed.     Melatonin 10 MG TABS Take 1 tablet by mouth at bedtime.     metoprolol tartrate (LOPRESSOR) 25 MG tablet Take one tablet(25 mg )  in the AM and take 1/2 Tablet (12.5mg ) in the PM 135 tablet 3   No current facility-administered medications for this visit.    ALLERGIES:  No Known Allergies  PHYSICAL EXAM:  Performance status (ECOG): 1 - Symptomatic but completely ambulatory  There were no vitals filed for this visit. Wt Readings from Last 3 Encounters:  09/11/20 163 lb 4.8 oz (74.1 kg)  08/28/20 168 lb 3.2 oz (76.3 kg)  08/14/20 168 lb 6.4 oz (76.4 kg)   Physical Exam Vitals reviewed.  Constitutional:      Appearance: Normal appearance.  Cardiovascular:     Rate and Rhythm: Normal rate and regular rhythm.     Pulses: Normal pulses.     Heart sounds: Normal heart sounds.  Pulmonary:     Effort: Pulmonary effort is normal.     Breath sounds: Normal breath sounds.  Neurological:     General: No focal deficit present.     Mental Status: He is alert  and oriented to person, place, and time.  Psychiatric:        Mood and Affect: Mood normal.        Behavior: Behavior normal.    LABORATORY DATA:  I have reviewed the labs as listed.  CBC Latest Ref Rng & Units 09/11/2020 08/28/2020 08/14/2020  WBC 4.0 - 10.5 K/uL 7.5 6.7 5.1  Hemoglobin 13.0 - 17.0 g/dL 13.3 12.4(L) 12.0(L)  Hematocrit 39.0 - 52.0 % 39.7 37.0(L) 37.0(L)  Platelets 150 - 400 K/uL 138(L) 134(L) 127(L)   CMP Latest Ref Rng & Units 09/11/2020 08/28/2020 08/14/2020  Glucose 70 - 99 mg/dL 96 97 89  BUN 8 - 23 mg/dL 15 13 18   Creatinine 0.61 - 1.24 mg/dL 1.07 1.03 1.18  Sodium 135 - 145 mmol/L 135 135 138  Potassium 3.5 - 5.1 mmol/L 4.4 3.7 3.9  Chloride 98 - 111 mmol/L 102 102 104  CO2 22 - 32 mmol/L 27 28 27   Calcium 8.9 - 10.3 mg/dL 9.0 8.5(L) 8.7(L)  Total Protein 6.5 - 8.1 g/dL 7.2 6.7 6.7  Total Bilirubin 0.3 - 1.2 mg/dL 0.7 0.5 0.4  Alkaline Phos 38 - 126 U/L 72 69 59  AST 15 - 41 U/L 22 24 24   ALT 0 - 44 U/L 17 19 17     DIAGNOSTIC IMAGING:  I have independently reviewed the scans and discussed with the patient. CT CHEST ABDOMEN PELVIS W CONTRAST  Result Date: 09/06/2020 CLINICAL DATA:  New LEFT-sided pain. Lung cancer. Squamous cell carcinoma diagnosed September 2021. Radiation completed. EXAM: CT CHEST, ABDOMEN, AND PELVIS WITH CONTRAST TECHNIQUE: Multidetector CT imaging of the chest, abdomen and pelvis was performed following the standard protocol during bolus administration of intravenous contrast. CONTRAST:  125mL OMNIPAQUE IOHEXOL 300 MG/ML  SOLN COMPARISON:  07/20/2020, CT FINDINGS: CT CHEST FINDINGS Cardiovascular: Port in the anterior chest wall with tip in distal SVC. Truncation of the LEFT upper lobe pulmonary artery related to consolidation. Small pericardial effusion adjacent to the LEFT ventricle unchanged. Mediastinum/Nodes: No axillary or supraclavicular adenopathy. There  is fullness in the AP window which appears to enhance measuring 2.2 cm image 18/2.  There is enhancing nodule within the anterior LEFT hilum measuring 14 mm (23/series 2) not evident on comparison CT which was noncontrast. Lesion not evident on CT of 01/20/2020 which was contrast enhanced Lungs/Pleura: Continue consolidation in LEFT upper lobe not significantly changed. The nodule of concern in the LEFT lower lobe measures 7 mm (image 23/2) not changed from 7 mm on comparison exam. Mild atelectasis at the LEFT lung base unchanged. Perihilar radiation change in the superior LEFT lower lobe is stable. Musculoskeletal: No aggressive osseous lesion. CT ABDOMEN AND PELVIS FINDINGS Hepatobiliary: No focal hepatic lesion. No biliary ductal dilatation. Gallbladder is normal. Common bile duct is normal. Pancreas: Pancreas is normal. No ductal dilatation. No pancreatic inflammation. Spleen: Normal spleen Adrenals/urinary tract: Adrenal glands and kidneys are normal. The ureters and bladder normal. Stomach/Bowel: Stomach, small bowel, appendix, and cecum are normal. The colon and rectosigmoid colon are normal. Vascular/Lymphatic: Abdominal aorta is normal caliber. There is no retroperitoneal or periportal lymphadenopathy. No pelvic lymphadenopathy. Reproductive: Unremarkable Other: No peritoneal metastasis. Musculoskeletal: No aggressive osseous lesion. IMPRESSION: Chest Impression: 1. New enhancing nodule in the anterior aspect of the LEFT hilum is highly concerning for lung cancer recurrence. 2. Enhancing tissue in the AP window at site of prior hypermetabolic infiltrative mass on comparison PET-CT scan 2021. Recommend repeat PET-CT scan for evaluation of the LEFT hilum and AP window. 3. The nodule concern in LEFT lower lobe is unchanged in size. Recommend evaluation with PET-CT scan above. 4. Persistent dense consolidation in the LEFT upper lobe with extension to the LEFT hilum. Abdomen / Pelvis Impression: 1.  No evidence of metastatic disease in the abdomen pelvis. 2.  No evidence of skeletal metastasis.  Electronically Signed   By: Suzy Bouchard M.D.   On: 09/06/2020 16:13   NM PET Image Restag (PS) Skull Base To Thigh  Result Date: 09/21/2020 CLINICAL DATA:  Subsequent treatment strategy for non-small cell lung cancer. Ongoing immunotherapy. Radiation therapy in December 2021. Chemotherapy in December 2021. EXAM: NUCLEAR MEDICINE PET SKULL BASE TO THIGH TECHNIQUE: 8.6 mCi F-18 FDG was injected intravenously. Full-ring PET imaging was performed from the skull base to thigh after the radiotracer. CT data was obtained and used for attenuation correction and anatomic localization. Fasting blood glucose: 114 mg/dl COMPARISON:  Multiple exams, including CT scan 09/05/2020 and prior PET-CT from 10/14/2019 FINDINGS: Mediastinal blood pool activity: SUV max 2.3 Liver activity: SUV max NA NECK: There is new unusual high density in the vocal cords as on image 65 series 3, retrospectively visible on 09/05/2020 but not on 07/20/2020, associated with accentuated metabolic activity. The right focal cord has maximum SUV of 9.6 with the left having a maximum SUV of 3.9. Possibilities might include laryngoplasty/vocal cord injection or calcification associated with inflammation and resulting hypermetabolic activity-correlate with patient's procedural history and any changes in phonation. Possible accentuated activity in the right frontal white matter on image 320 of the attenuation corrected series, with hypodensity in this vicinity on the CT data which could possibly represent vasogenic edema. Brain imaging by MRI or CT with and without contrast is recommended to exclude intracranial metastatic disease. No lesion was evident in this region on the prior MRI brain from 10/28/2019. Incidental CT findings: Bilateral common carotid atherosclerotic calcification. CHEST: Although there has been substantial reduction in size of the left upper lobe mass compared to the prior PET-CT, there is hypermetabolic residual tumor in the  AP  window and left hilar region. Specifically, the AP window tumor measures about 3.5 by 2.5 cm on image 96 series 3 and has maximum SUV of 13.6. Previously the focal activity in this vicinity had an SUV of about 14.3. Multifocal activity along the left hilum is present with a suprahilar lesion with maximum SUV of 13.9 measuring about 1.6 cm in diameter, and nodularity tracking adjacent to the right pulmonary vein in the infrahilar region anteriorly with maximum SUV of 9.8. Atelectatic component of the left upper lobe with low-grade internal activity, SUV about 3.1. A peripheral left lower lobe nodule seen on recent chest CT of 09/05/2020 measures about 0.8 cm in diameter on image 108 series 3, and has a maximum SUV of 4.2, compatible with a metastatic lesion. A paraesophageal lymph node measuring about 0.6 cm in short axis on image 124 of series 3 has a maximum SUV of 5.6, compatible with malignancy. Incidental CT findings: Coronary, aortic arch, and branch vessel atherosclerotic vascular disease. Mild cardiomegaly. 15% left pneumothorax primarily seen along the base and the apex, mildly increased compared to 09/05/2020. ABDOMEN/PELVIS: Small but persistent focus of accentuated metabolic activity in the distal sigmoid colon, less than 1 cm in diameter, maximum SUV 6.8 and previously 5.8, quite likely a small colon polyp. Incidental CT findings: Aortoiliac atherosclerotic vascular disease. Photopenic right mid kidney cyst. SKELETON: No significant abnormal hypermetabolic activity in this region. Incidental CT findings: Bridging spurring of both sacroiliac joints. 0.5 cm sclerotic lesion in the right iliac bone on image 216 series 3 is unchanged from 10/14/2019 and not hypermetabolic. This is likely a benign bone island or similar benign lesion. Lumbar spondylosis and degenerative disc disease. IMPRESSION: 1. The previous 5-10% left hydropneumothorax has increased to about 15% of left hemithoracic volume today. No  findings of tension component. Continued left upper lobe volume loss and post radiation therapy findings. 2. Active hypermetabolic malignancy in the left AP window and left hilum, along with a hypermetabolic 8 mm left lower lobe nodule favoring a metastatic lesion. Small but hypermetabolic lower paraesophageal lymph node. 3. Hypodensity in the right frontal lobe on the CT data, with some heterogeneous/increased activity in this vicinity on the PET data, raising the possibility of intracranial metastatic disease. MRI brain with and without contrast is recommended for further characterization. 4. Unusual appearance of the vocal cords which are no high in density and demonstrate hypermetabolic activity. Query laryngoplasty/vocal cord injection, versus interval calcification associated with inflammation. Has the patient had any procedure or changes in phonation? 5. Sub-centimeter focal hypermetabolic activity in the distal sigmoid colon, roughly stable from prior, suspicious for a small colonic polyp. 6. Aortic Atherosclerosis (ICD10-I70.0). Coronary atherosclerosis with mild cardiomegaly. Electronically Signed   By: Van Clines M.D.   On: 09/21/2020 16:30     ASSESSMENT:  1.  Left lung cancer: -Presentation to the ER on 10/06/2019 with cough and dizziness. -CT chest with contrast on 10/06/2019 showed large left suprahilar mass completely obstructing left upper lobe bronchus, measuring 7 x 5.3 x 5.8 cm.  Mass extends into AP window and narrows the left mainstem bronchus.  Small subpleural nodule in the left lower lobe measuring 4 mm indeterminate.  AP window nodal mass measuring 3.4 cm.  No supraclavicular adenopathy.  Subcarinal enlarged lymph node measures 1.4 cm. -PET scan on 10/14/2019 shows large hypermetabolic left upper lobe mass surrounding the left hilum, extending into pleural surface not invading the chest wall.  Direct invasion into the AP window and metastatic  subcarinal lymph node, T4 N2 M0.   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 -10 pound weight loss in the last 6 months.  Hoarseness for last 1 week. -MRI of the brain on 10/28/2019 with no evidence of metastasis. -Concurrent chemoradiation therapy started on 11/28/2019. Completed 01/02/20 ( chemo) 01/06/2020(radiation) -CT chest on 01/20/2020 showed significant interval decrease in consolidation of the left upper lobe, interval decrease in irregular nodularity and consolidation of the left lower lobe.  Unchanged posttreatment appearance of soft tissue about the left hilum and AP window.  No discretely enlarged mediastinal or hilar lymph nodes.  Trace left pleural effusion, decreased compared to prior exam. -Consolidation durvalumab started on 01/30/2020. -CT chest with contrast on 03/30/2020 with the development of small left-sided pleural effusion with further volume loss.  Bandlike scarring in the left lower lobe since prior study likely posttreatment changes.  Small bone lesion at T1 stable.  1 small nodule in the right chest which is new about 4 mm.  Will monitor.   2.  Social/family history: -He smoked 1 pack/day for 45 years, quit 5 years ago. -No family history of malignancies.   PLAN:  1.  Advanced squamous cell carcinoma of the left lung: - We have reviewed PET scan images from 09/20/2020. - This showed hypermetabolic lymph node in the left AP window and left hilum along with hypermetabolic 8 mm left lower lobe nodule.  Small hypermetabolic lower paraesophageal lymph node.  Hypodensity in the right frontal area with some increased activity. - Based on these findings, I have recommended discontinuation of Imfinzi. - I have talked to him about second line chemotherapy with docetaxel and Cyramza. - We discussed the side effects in detail.  He is agreeable to proceed with second line treatment. - We will likely start as soon as possible. - Based on the PET scan findings,  will order MRI of the brain with and without contrast for further evaluation of right frontal lesion. - He is slightly drowsy today.  He is taking melatonin at bedtime.  I have asked him to take melatonin around 5 PM.   2.  Left hydropneumothorax: - CT scan 2 weeks ago showed 5 to 10% left hydropneumothorax. - Current PET scan showed it has increased to about 15%. - Patient is completely asymptomatic.  Sats are 100% on room air.  Does not report any worsening shortness of breath from baseline or chest pain. - I have recommended he be evaluated in the ER if he develops any sudden worsening of shortness of breath. - We will make an appointment for CT surgery evaluation.   3.  Atrial fibrillation: - Continue Eliquis 5 mg twice daily.  No bleeding issues reported.   4.  Weight loss: -Continue Marinol 5 mg twice daily.   5.  Hypertension: - Continue metoprolol 1 tablet in the morning and half tablet in the evening.   6.  Hypothyroidism: -Continue Synthroid 10 mcg daily.  Last TSH was 1.063.  7.  Cough: - Continue hydrocodone syrup.   Orders placed this encounter:  No orders of the defined types were placed in this encounter.    Derek Jack, MD [place] [phone]   I, Thana Ates, am acting as a scribe for Dr. Derek Jack.  I, Derek Jack MD, have reviewed the above documentation for accuracy and completeness, and I agree with the above.

## 2020-09-24 NOTE — Progress Notes (Signed)
Call report from [facility] concerning PET scan.    IMPRESSION: 1. The previous 5-10% left hydropneumothorax has increased to about 15% of left hemithoracic volume today. No findings of tension component. Continued left upper lobe volume loss and post radiation therapy findings. 3. Hypodensity in the right frontal lobe on the CT data, with some heterogeneous/increased activity in this vicinity on the PET data, raising the possibility of intracranial metastatic disease. MRI brain with and without contrast is recommended for further characterization.  MRI brain with and without ordered per Dr. Tomie China request.

## 2020-09-25 ENCOUNTER — Other Ambulatory Visit (HOSPITAL_COMMUNITY): Payer: Medicare Other

## 2020-09-25 ENCOUNTER — Inpatient Hospital Stay (HOSPITAL_COMMUNITY): Payer: Medicare Other

## 2020-09-25 ENCOUNTER — Other Ambulatory Visit: Payer: Self-pay

## 2020-09-25 ENCOUNTER — Inpatient Hospital Stay (HOSPITAL_BASED_OUTPATIENT_CLINIC_OR_DEPARTMENT_OTHER): Payer: Medicare Other | Admitting: Hematology

## 2020-09-25 ENCOUNTER — Inpatient Hospital Stay (HOSPITAL_COMMUNITY): Payer: Medicare Other | Attending: Hematology and Oncology

## 2020-09-25 DIAGNOSIS — C3492 Malignant neoplasm of unspecified part of left bronchus or lung: Secondary | ICD-10-CM | POA: Diagnosis not present

## 2020-09-25 DIAGNOSIS — I4891 Unspecified atrial fibrillation: Secondary | ICD-10-CM | POA: Diagnosis not present

## 2020-09-25 DIAGNOSIS — E039 Hypothyroidism, unspecified: Secondary | ICD-10-CM | POA: Diagnosis not present

## 2020-09-25 DIAGNOSIS — Z452 Encounter for adjustment and management of vascular access device: Secondary | ICD-10-CM | POA: Diagnosis not present

## 2020-09-25 DIAGNOSIS — Z5111 Encounter for antineoplastic chemotherapy: Secondary | ICD-10-CM | POA: Diagnosis present

## 2020-09-25 DIAGNOSIS — C3412 Malignant neoplasm of upper lobe, left bronchus or lung: Secondary | ICD-10-CM | POA: Insufficient documentation

## 2020-09-25 DIAGNOSIS — R634 Abnormal weight loss: Secondary | ICD-10-CM | POA: Insufficient documentation

## 2020-09-25 DIAGNOSIS — I10 Essential (primary) hypertension: Secondary | ICD-10-CM | POA: Insufficient documentation

## 2020-09-25 LAB — CBC WITH DIFFERENTIAL/PLATELET
Abs Immature Granulocytes: 0.03 10*3/uL (ref 0.00–0.07)
Basophils Absolute: 0.1 10*3/uL (ref 0.0–0.1)
Basophils Relative: 1 %
Eosinophils Absolute: 0.2 10*3/uL (ref 0.0–0.5)
Eosinophils Relative: 3 %
HCT: 37.4 % — ABNORMAL LOW (ref 39.0–52.0)
Hemoglobin: 12.4 g/dL — ABNORMAL LOW (ref 13.0–17.0)
Immature Granulocytes: 0 %
Lymphocytes Relative: 9 %
Lymphs Abs: 0.7 10*3/uL (ref 0.7–4.0)
MCH: 31.6 pg (ref 26.0–34.0)
MCHC: 33.2 g/dL (ref 30.0–36.0)
MCV: 95.2 fL (ref 80.0–100.0)
Monocytes Absolute: 0.6 10*3/uL (ref 0.1–1.0)
Monocytes Relative: 8 %
Neutro Abs: 6.2 10*3/uL (ref 1.7–7.7)
Neutrophils Relative %: 79 %
Platelets: 125 10*3/uL — ABNORMAL LOW (ref 150–400)
RBC: 3.93 MIL/uL — ABNORMAL LOW (ref 4.22–5.81)
RDW: 12.9 % (ref 11.5–15.5)
WBC: 7.8 10*3/uL (ref 4.0–10.5)
nRBC: 0 % (ref 0.0–0.2)

## 2020-09-25 LAB — COMPREHENSIVE METABOLIC PANEL
ALT: 15 U/L (ref 0–44)
AST: 19 U/L (ref 15–41)
Albumin: 3.8 g/dL (ref 3.5–5.0)
Alkaline Phosphatase: 70 U/L (ref 38–126)
Anion gap: 6 (ref 5–15)
BUN: 18 mg/dL (ref 8–23)
CO2: 27 mmol/L (ref 22–32)
Calcium: 8.8 mg/dL — ABNORMAL LOW (ref 8.9–10.3)
Chloride: 102 mmol/L (ref 98–111)
Creatinine, Ser: 1.07 mg/dL (ref 0.61–1.24)
GFR, Estimated: >60 mL/min (ref >60)
Glucose, Bld: 106 mg/dL — ABNORMAL HIGH (ref 70–99)
Potassium: 4.1 mmol/L (ref 3.5–5.1)
Sodium: 135 mmol/L (ref 135–145)
Total Bilirubin: 0.7 mg/dL (ref 0.3–1.2)
Total Protein: 6.8 g/dL (ref 6.5–8.1)

## 2020-09-25 LAB — MAGNESIUM: Magnesium: 1.8 mg/dL (ref 1.7–2.4)

## 2020-09-25 MED ORDER — CLONIDINE HCL 0.1 MG PO TABS
ORAL_TABLET | ORAL | Status: AC
  Filled 2020-09-25: qty 1

## 2020-09-25 MED ORDER — HEPARIN SOD (PORK) LOCK FLUSH 100 UNIT/ML IV SOLN
500.0000 [IU] | Freq: Once | INTRAVENOUS | Status: AC
  Administered 2020-09-25: 500 [IU] via INTRAVENOUS

## 2020-09-25 MED ORDER — SODIUM CHLORIDE 0.9% FLUSH
10.0000 mL | Freq: Once | INTRAVENOUS | Status: AC
  Administered 2020-09-25: 10 mL via INTRAVENOUS

## 2020-09-25 NOTE — Patient Instructions (Signed)
[facility] [place]  Discharge Instructions: Thank you for choosing [facility] to provide your oncology and hematology care.  If you have a lab appointment with the [facility], please come in thru the Main Entrance and check in at the main information desk.  Wear comfortable clothing and clothing appropriate for easy access to any Portacath or PICC line.   We strive to give you quality time with your provider. You may need to reschedule your appointment if you arrive late (15 or more minutes).  Arriving late affects you and other patients whose appointments are after yours.  Also, if you miss three or more appointments without notifying the office, you may be dismissed from the clinic at the provider's discretion.      For prescription refill requests, have your pharmacy contact our office and allow 72 hours for refills to be completed.    Today you received the following chemotherapy and/or immunotherapy agents       To help prevent nausea and vomiting after your treatment, we encourage you to take your nausea medication as directed.  BELOW ARE SYMPTOMS THAT SHOULD BE REPORTED IMMEDIATELY: *FEVER GREATER THAN 100.4 F (38 C) OR HIGHER *CHILLS OR SWEATING *NAUSEA AND VOMITING THAT IS NOT CONTROLLED WITH YOUR NAUSEA MEDICATION *UNUSUAL SHORTNESS OF BREATH *UNUSUAL BRUISING OR BLEEDING *URINARY PROBLEMS (pain or burning when urinating, or frequent urination) *BOWEL PROBLEMS (unusual diarrhea, constipation, pain near the anus) TENDERNESS IN MOUTH AND THROAT WITH OR WITHOUT PRESENCE OF ULCERS (sore throat, sores in mouth, or a toothache) UNUSUAL RASH, SWELLING OR PAIN  UNUSUAL VAGINAL DISCHARGE OR ITCHING   Items with * indicate a potential emergency and should be followed up as soon as possible or go to the Emergency Department if any problems should occur.  Please show the CHEMOTHERAPY ALERT CARD or IMMUNOTHERAPY ALERT CARD at check-in to the Emergency Department and  triage nurse.  Should you have questions after your visit or need to cancel or reschedule your appointment, please contact [facility] [phone]  and follow the prompts.  Office hours are 8:00 a.m. to 4:30 p.m. Monday - Friday. Please note that voicemails left after 4:00 p.m. may not be returned until the following business day.  We are closed weekends and major holidays. You have access to a nurse at all times for urgent questions. Please call the main number to the clinic [phone] and follow the prompts.  For any non-urgent questions, you may also contact your provider using MyChart. We now offer e-Visits for anyone 71 and older to request care online for non-urgent symptoms. For details visit [email or web address].   Also download the MyChart app! Go to the app store, search "MyChart", open the app, select [facility], and log in with your MyChart username and password.  Due to Covid, a mask is required upon entering the hospital/clinic. If you do not have a mask, one will be given to you upon arrival. For doctor visits, patients may have 1 support person aged 71 or older with them. For treatment visits, patients cannot have anyone with them due to current Covid guidelines and our immunocompromised population.

## 2020-09-25 NOTE — Progress Notes (Signed)
No treatment today per MD. Changing plan and having scans done per MD.  Alan [name] office assessment done, patient did not come back to infusion room today.   discharged home from clinic ambulatory. Follow up as scheduled.

## 2020-09-25 NOTE — Patient Instructions (Signed)
[facility] at [facility] Discharge Instructions  You were seen today by Dr. Delton Coombes. He went over your recent results. You will be scheduled for an MRI of your brain prior to your next visit, and you will be referred for CT surgery. Dr. Delton Coombes will see you back in 1 week for labs and follow up.   Thank you for choosing [facility] to provide your oncology and hematology care.  To afford each patient quality time with our provider, please arrive at least 15 minutes before your scheduled appointment time.   If you have a lab appointment with the [facility] please come in thru the Main Entrance and check in at the main information desk  You need to re-schedule your appointment should you arrive 10 or more minutes late.  We strive to give you quality time with our providers, and arriving late affects you and other patients whose appointments are after yours.  Also, if you no show three or more times for appointments you may be dismissed from the clinic at the providers discretion.     Again, thank you for choosing [facility].  Our hope is that these requests will decrease the amount of time that you wait before being seen by our physicians.       _____________________________________________________________  Should you have questions after your visit to [facility], please contact our office at [phone] between the hours of 8:00 a.m. and 4:30 p.m.  Voicemails left after 4:00 p.m. will not be returned until the following business day.  For prescription refill requests, have your pharmacy contact our office and allow 72 hours.    Cancer Center Support Programs:   > Cancer Support Group  2nd Tuesday of the month 1pm-2pm, Journey Room

## 2020-09-25 NOTE — Progress Notes (Signed)
No treatment today per Dr. Delton Coombes, patients treatmnet will be changing.  Patients port flushed without difficulty.  Good blood return noted with no bruising or swelling noted at site.  needle removed intact.  Band aid applied.  VSS with discharge and left in satisfactory condition ambulatory with no s/s of distress noted.

## 2020-09-25 NOTE — Progress Notes (Signed)
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

## 2020-09-25 NOTE — Progress Notes (Signed)
Patients port flushed without difficulty.  Good blood return noted with no bruising or swelling noted at site.  Stable during access and blood draw.  Patient to remain accessed for treatment. 

## 2020-09-28 NOTE — Progress Notes (Signed)
Pharmacist Chemotherapy Monitoring - Initial Assessment    Anticipated start date: 10/03/2020   The following has been reviewed per standard work regarding the patient's treatment regimen: The patient's diagnosis, treatment plan and drug doses, and organ/hematologic function Lab orders and baseline tests specific to treatment regimen  The treatment plan start date, drug sequencing, and pre-medications Prior authorization status  Patient's documented medication list, including drug-drug interaction screen and prescriptions for anti-emetics and supportive care specific to the treatment regimen The drug concentrations, fluid compatibility, administration routes, and timing of the medications to be used The patient's access for treatment and lifetime cumulative dose history, if applicable  The patient's medication allergies and previous infusion related reactions, if applicable   Changes made to treatment plan:  treatment plan date  Follow up needed:  adding lab orders - need to be released   Wynona Neat, [facility], 09/28/2020  2:14 PM

## 2020-10-02 ENCOUNTER — Encounter (HOSPITAL_COMMUNITY): Payer: Self-pay

## 2020-10-02 ENCOUNTER — Ambulatory Visit (HOSPITAL_COMMUNITY)
Admission: RE | Admit: 2020-10-02 | Discharge: 2020-10-02 | Disposition: A | Discharge status: Home / Self Care | Payer: Medicare Other | Source: Ambulatory Visit | Attending: Hematology | Admitting: Hematology

## 2020-10-02 ENCOUNTER — Other Ambulatory Visit (HOSPITAL_COMMUNITY): Payer: Self-pay

## 2020-10-02 ENCOUNTER — Other Ambulatory Visit: Payer: Self-pay

## 2020-10-02 DIAGNOSIS — C3492 Malignant neoplasm of unspecified part of left bronchus or lung: Secondary | ICD-10-CM | POA: Insufficient documentation

## 2020-10-02 MED ORDER — GADOBUTROL 1 MMOL/ML IV SOLN
7.0000 mL | Freq: Once | INTRAVENOUS | Status: AC | PRN
  Administered 2020-10-02: 7 mL via INTRAVENOUS

## 2020-10-02 MED ORDER — DEXAMETHASONE 4 MG PO TABS: 4.0000 mg | ORAL_TABLET | Freq: Four times a day (QID) | ORAL | 0 refills | Status: DC

## 2020-10-02 NOTE — Progress Notes (Signed)
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  PRIOR THERAPY:  Concurrent chemoradiation with weekly carboplatin and paclitaxel x 6 cycles from 11/28/2019 to 01/02/2020  NGS Results: not done  CURRENT THERAPY: Taxotere and ramucirumab every 3 weeks  BRIEF ONCOLOGIC HISTORY:  Oncology History  Squamous cell lung cancer, left ([place])  11/08/2019 Initial Diagnosis   Squamous cell lung cancer, left ([place])   11/08/2019 Cancer Staging   Staging form: Lung, AJCC 8th Edition - Clinical stage from 11/08/2019: Stage IIIB (cT4, cN2, cM0) - Signed by Derek Jack, MD on 11/08/2019   11/28/2019 - 01/02/2020 Chemotherapy   The Alan [name] had palonosetron (ALOXI) injection 0.25 mg, 0.25 mg, Intravenous,  Once, 6 of 6 cycles Administration: 0.25 mg (11/28/2019), 0.25 mg (12/05/2019), 0.25 mg (12/12/2019), 0.25 mg (12/19/2019), 0.25 mg (12/26/2019), 0.25 mg (01/02/2020) CARBOplatin (PARAPLATIN) 180 mg in sodium chloride 0.9 % 250 mL chemo infusion, 180 mg (100 % of original dose 176 mg), Intravenous,  Once, 6 of 6 cycles Dose modification:   (original dose 176 mg, Cycle 1),   (original dose 201.2 mg, Cycle 2),   (original dose 179.2 mg, Cycle 3),   (original dose 179.2 mg, Cycle 4),   (original dose 195.4 mg, Cycle 5),   (original dose 178.2 mg, Cycle 6) Administration: 180 mg (11/28/2019), 200 mg (12/05/2019), 180 mg (12/12/2019), 180 mg (12/19/2019), 190 mg (12/26/2019), 180 mg (01/02/2020) PACLitaxel (TAXOL) 84 mg in sodium chloride 0.9 % 250 mL chemo infusion (</= 80mg /m2), 45 mg/m2 = 84 mg, Intravenous,  Once, 6 of 6 cycles Administration: 84 mg (11/28/2019), 84 mg (12/05/2019), 84 mg (12/12/2019), 84 mg (12/19/2019), 84 mg (12/26/2019), 84 mg (01/02/2020)   for chemotherapy treatment.      01/30/2020 - 09/11/2020 Chemotherapy          10/03/2020 -  Chemotherapy    Alan [name] is on Treatment Plan: LUNG DOCETAXEL + RAMUCIRUMAB Q21D          CANCER STAGING: Cancer Staging Squamous cell lung cancer, left ([place]) Staging form: Lung, AJCC 8th Edition - Clinical stage from 11/08/2019: Stage IIIB (cT4, cN2, cM0) - Signed by Derek Jack, MD on 11/08/2019   INTERVAL HISTORY:  Alan [name], a 70 y.o. male, returns for routine follow-up and consideration for next cycle of chemotherapy. Alan [name] was last seen on 09/25/2020.  Due for cycle #1 of Taxotere and ramucirumab today.   Overall, Alan [name] tells me Alan [name] has been feeling pretty well. Alan [name] denies any headaches, difficulty urinating, extremity weakness, and difficulty walking, but Alan [name] has experienced atypical changes in mood. Alan [name] denies history of DM.   Overall, Alan [name] feels ready for next cycle of chemo today.   REVIEW OF SYSTEMS:  Review of Systems  Constitutional:  Positive for fatigue (40%). Negative for appetite change (70%).  Respiratory:  Positive for cough.   Genitourinary:  Negative for difficulty urinating.   Musculoskeletal:  Negative for gait problem.  Neurological:  Positive for numbness (hands). Negative for extremity weakness, gait problem and headaches.  Psychiatric/Behavioral:  Positive for sleep disturbance.   All other systems reviewed and are negative.  PAST MEDICAL/SURGICAL HISTORY:  Past Medical History:  Diagnosis Date   Arthritis    Hyperlipemia    Hypertension    Hyperthyroidism    Lung cancer (  [place])    Myocardial infarction ([place])    Paroxysmal atrial fibrillation ([place])    Port-A-Cath in place 11/14/2019   Tachycardia    Past Surgical History:  Procedure Laterality Date   CARDIAC CATHETERIZATION     PORTACATH PLACEMENT Right 11/01/2019   Procedure: INSERTION PORT-A-CATH;  Surgeon: Aviva Signs, MD;  Location: [facility];  Service: General;  Laterality: Right;    SOCIAL HISTORY:  Social  History   Socioeconomic History   Marital status: Married    Spouse name: Not on file   Number of children: 1   Years of education: Not on file   Highest education level: Not on file  Occupational History   Occupation: retired   Occupation: Glass blower/designer: [employer]  Tobacco Use   Smoking status: Former    Years: 45.00    Types: Cigars, Cigarettes    Quit date: 10/19/2014    Years since quitting: 5.9   Smokeless tobacco: Never  Vaping Use   Vaping Use: Never used  Substance and Sexual Activity   Alcohol use: Never   Drug use: No   Sexual activity: Not on file  Other Topics Concern   Not on file  Social History Narrative   Not on file   Social Determinants of Health   Financial Resource Strain: Medium Risk   Difficulty of Paying Living Expenses: Somewhat hard  Food Insecurity: No Food Insecurity   Worried About Charity fundraiser in the Last Year: Never true   [place] in the Last Year: Never true  Transportation Needs: No Transportation Needs   Lack of Transportation (Medical): No   Lack of Transportation (Non-Medical): No  Physical Activity: Inactive   Days of Exercise per Week: 0 days   Minutes of Exercise per Session: 0 min  Stress: Stress Concern Present   Feeling of Stress : Very much  Social Connections: Moderately Isolated   Frequency of Communication with Friends and Family: More than three times a week   Frequency of Social Gatherings with Friends and Family: More than three times a week   Attends Religious Services: Never   Marine scientist or Organizations: No   Attends Music therapist: Never   Marital Status: Married  Human resources officer Violence: Not At Risk   Fear of Current or Ex-Partner: No   Emotionally Abused: No   Physically Abused: No   Sexually Abused: No    FAMILY HISTORY:  Family History  Problem Relation Age of Onset   Hypertension Father    Dementia Father        died age 33   Heart disease Father     Heart failure Other     CURRENT MEDICATIONS:  Current Outpatient Medications  Medication Sig Dispense Refill   acetaminophen (TYLENOL) 500 MG tablet Take 500 mg by mouth every 6 (six) hours as needed for moderate pain or headache. (Alan [name] not taking: Reported on 09/25/2020)     albuterol (PROVENTIL) (2.5 MG/3ML) 0.083% nebulizer solution Take 3 mLs (2.5 mg total) by nebulization every 6 (six) hours as needed for wheezing or shortness of breath. (Alan [name] not taking: Reported on 09/25/2020) 75 mL 12   ALPRAZolam (XANAX) 0.25 MG tablet TAKE 1 TABLET BY MOUTH THREE TIMES DAILY AS NEEDED FOR ANXIETY (Alan [name] not taking: Reported on 09/25/2020) 30 tablet 0   dexamethasone (DECADRON) 4 MG tablet Take 1 tablet (4 mg total) by mouth every 6 (six) hours. 60 tablet 0  dronabinol (MARINOL) 5 MG capsule Take 1 capsule (5 mg total) by mouth 2 (two) times daily before a meal. 60 capsule 2   Durvalumab (IMFINZI IV) Inject into the vein.     ELIQUIS 5 MG TABS tablet Take 1 tablet by mouth twice daily 60 tablet 5   HYCODAN 5-1.5 MG/5ML syrup take 15 mls BY MOUTH EVERY 8 HOURS AS NEEDED FOR cough 473 mL 0   levothyroxine (SYNTHROID) 100 MCG tablet Take 1 tablet (100 mcg total) by mouth daily before breakfast. 30 tablet 3   lidocaine (XYLOCAINE) 2 % solution TAKE 15 ML BY MOUTH  4 TIMES DAILY WITH MEALS 480 mL 0   lidocaine-prilocaine (EMLA) cream Apply 1 application topically as needed.     Melatonin 10 MG TABS Take 1 tablet by mouth at bedtime.     metoprolol tartrate (LOPRESSOR) 25 MG tablet Take one tablet(25 mg )  in the AM and take 1/2 Tablet (12.5mg ) in the PM 135 tablet 3   No current facility-administered medications for this visit.    ALLERGIES:  No Known Allergies  PHYSICAL EXAM:  Performance status (ECOG): 1 - Symptomatic but completely ambulatory  There were no vitals filed for this visit. Wt Readings from Last 3 Encounters:  09/25/20 162 lb 6.4 oz (73.7 kg)  09/11/20 163 lb 4.8 oz (74.1 kg)   08/28/20 168 lb 3.2 oz (76.3 kg)   Physical Exam Vitals reviewed.  Constitutional:      Appearance: Normal appearance.  Cardiovascular:     Rate and Rhythm: Normal rate and regular rhythm.     Pulses: Normal pulses.     Heart sounds: Normal heart sounds.  Pulmonary:     Effort: Pulmonary effort is normal.     Breath sounds: Normal breath sounds.  Neurological:     General: No focal deficit present.     Mental Status: Alan [name] is alert and oriented to person, place, and time.  Psychiatric:        Mood and Affect: Mood normal.        Behavior: Behavior normal.    LABORATORY DATA:  I have reviewed the labs as listed.  CBC Latest Ref Rng & Units 09/25/2020 09/11/2020 08/28/2020  WBC 4.0 - 10.5 K/uL 7.8 7.5 6.7  Hemoglobin 13.0 - 17.0 g/dL 12.4(L) 13.3 12.4(L)  Hematocrit 39.0 - 52.0 % 37.4(L) 39.7 37.0(L)  Platelets 150 - 400 K/uL 125(L) 138(L) 134(L)   CMP Latest Ref Rng & Units 09/25/2020 09/11/2020 08/28/2020  Glucose 70 - 99 mg/dL 106(H) 96 97  BUN 8 - 23 mg/dL 18 15 13   Creatinine 0.61 - 1.24 mg/dL 1.07 1.07 1.03  Sodium 135 - 145 mmol/L 135 135 135  Potassium 3.5 - 5.1 mmol/L 4.1 4.4 3.7  Chloride 98 - 111 mmol/L 102 102 102  CO2 22 - 32 mmol/L 27 27 28   Calcium 8.9 - 10.3 mg/dL 8.8(L) 9.0 8.5(L)  Total Protein 6.5 - 8.1 g/dL 6.8 7.2 6.7  Total Bilirubin 0.3 - 1.2 mg/dL 0.7 0.7 0.5  Alkaline Phos 38 - 126 U/L 70 72 69  AST 15 - 41 U/L 19 22 24   ALT 0 - 44 U/L 15 17 19     DIAGNOSTIC IMAGING:  I have independently reviewed the scans and discussed with the Alan [name]. MR Brain W Wo Contrast  Result Date: 10/02/2020 CLINICAL DATA:  Non-small cell lung cancer, metastatic disease evaluation, possible abnormality on PET EXAM: MRI HEAD WITHOUT AND WITH CONTRAST TECHNIQUE: Multiplanar, multiecho pulse sequences of  the brain and surrounding structures were obtained without and with intravenous contrast. CONTRAST:  79mL GADAVIST GADOBUTROL 1 MMOL/ML IV SOLN COMPARISON:  September 2021  FINDINGS: Brain: There is a centrally necrotic, enhancing lesion of the right frontal lobe centered within the white matter. This measures 2.7 x 3 cm. There is extensive surrounding edema. Regional mass effect is present including 5 mm of leftward midline shift. No hydrocephalus. There is no acute infarction or intracranial hemorrhage. Additional patchy T2 hyperintensity in the supratentorial white matter likely reflects stable minor chronic microvascular ischemic changes. Vascular: Major vessel flow voids at the skull base are preserved. Skull and upper cervical spine: Normal marrow signal is preserved. Sinuses/Orbits: Paranasal sinuses are aerated. Orbits are unremarkable. Other: Sella is unremarkable.  Mastoid air cells are clear. IMPRESSION: Right frontal lobe 3 cm metastasis with edema and mild mass effect including subcentimeter leftward midline shift. These results will be called to the ordering clinician or representative by the Radiologist Assistant, and communication documented in the PACS or [employer]. Electronically Signed   By: Macy Mis M.D.   On: 10/02/2020 12:35   CT CHEST ABDOMEN PELVIS W CONTRAST  Result Date: 09/06/2020 CLINICAL DATA:  New LEFT-sided pain. Lung cancer. Squamous cell carcinoma diagnosed September 2021. Radiation completed. EXAM: CT CHEST, ABDOMEN, AND PELVIS WITH CONTRAST TECHNIQUE: Multidetector CT imaging of the chest, abdomen and pelvis was performed following the standard protocol during bolus administration of intravenous contrast. CONTRAST:  170mL OMNIPAQUE IOHEXOL 300 MG/ML  SOLN COMPARISON:  07/20/2020, CT FINDINGS: CT CHEST FINDINGS Cardiovascular: Port in the anterior chest wall with tip in distal SVC. Truncation of the LEFT upper lobe pulmonary artery related to consolidation. Small pericardial effusion adjacent to the LEFT ventricle unchanged. Mediastinum/Nodes: No axillary or supraclavicular adenopathy. There is fullness in the AP window which appears  to enhance measuring 2.2 cm image 18/2. There is enhancing nodule within the anterior LEFT hilum measuring 14 mm (23/series 2) not evident on comparison CT which was noncontrast. Lesion not evident on CT of 01/20/2020 which was contrast enhanced Lungs/Pleura: Continue consolidation in LEFT upper lobe not significantly changed. The nodule of concern in the LEFT lower lobe measures 7 mm (image 23/2) not changed from 7 mm on comparison exam. Mild atelectasis at the LEFT lung base unchanged. Perihilar radiation change in the superior LEFT lower lobe is stable. Musculoskeletal: No aggressive osseous lesion. CT ABDOMEN AND PELVIS FINDINGS Hepatobiliary: No focal hepatic lesion. No biliary ductal dilatation. Gallbladder is normal. Common bile duct is normal. Pancreas: Pancreas is normal. No ductal dilatation. No pancreatic inflammation. Spleen: Normal spleen Adrenals/urinary tract: Adrenal glands and kidneys are normal. The ureters and bladder normal. Stomach/Bowel: Stomach, small bowel, appendix, and cecum are normal. The colon and rectosigmoid colon are normal. Vascular/Lymphatic: Abdominal aorta is normal caliber. There is no retroperitoneal or periportal lymphadenopathy. No pelvic lymphadenopathy. Reproductive: Unremarkable Other: No peritoneal metastasis. Musculoskeletal: No aggressive osseous lesion. IMPRESSION: Chest Impression: 1. New enhancing nodule in the anterior aspect of the LEFT hilum is highly concerning for lung cancer recurrence. 2. Enhancing tissue in the AP window at site of prior hypermetabolic infiltrative mass on comparison PET-CT scan 2021. Recommend repeat PET-CT scan for evaluation of the LEFT hilum and AP window. 3. The nodule concern in LEFT lower lobe is unchanged in size. Recommend evaluation with PET-CT scan above. 4. Persistent dense consolidation in the LEFT upper lobe with extension to the LEFT hilum. Abdomen / Pelvis Impression: 1.  No evidence of metastatic disease in the  abdomen pelvis.  2.  No evidence of skeletal metastasis. Electronically Signed   By: Suzy Bouchard M.D.   On: 09/06/2020 16:13   NM PET Image Restag (PS) Skull Base To Thigh  Result Date: 09/21/2020 CLINICAL DATA:  Subsequent treatment strategy for non-small cell lung cancer. Ongoing immunotherapy. Radiation therapy in December 2021. Chemotherapy in December 2021. EXAM: NUCLEAR MEDICINE PET SKULL BASE TO THIGH TECHNIQUE: 8.6 mCi F-18 FDG was injected intravenously. Full-ring PET imaging was performed from the skull base to thigh after the radiotracer. CT data was obtained and used for attenuation correction and anatomic localization. Fasting blood glucose: 114 mg/dl COMPARISON:  Multiple exams, including CT scan 09/05/2020 and prior PET-CT from 10/14/2019 FINDINGS: Mediastinal blood pool activity: SUV max 2.3 Liver activity: SUV max NA NECK: There is new unusual high density in the vocal cords as on image 65 series 3, retrospectively visible on 09/05/2020 but not on 07/20/2020, associated with accentuated metabolic activity. The right focal cord has maximum SUV of 9.6 with the left having a maximum SUV of 3.9. Possibilities might include laryngoplasty/vocal cord injection or calcification associated with inflammation and resulting hypermetabolic activity-correlate with Alan [name]'s procedural history and any changes in phonation. Possible accentuated activity in the right frontal white matter on image 320 of the attenuation corrected series, with hypodensity in this vicinity on the CT data which could possibly represent vasogenic edema. Brain imaging by MRI or CT with and without contrast is recommended to exclude intracranial metastatic disease. No lesion was evident in this region on the prior MRI brain from 10/28/2019. Incidental CT findings: Bilateral common carotid atherosclerotic calcification. CHEST: Although there has been substantial reduction in size of the left upper lobe mass compared to the prior PET-CT, there is  hypermetabolic residual tumor in the AP window and left hilar region. Specifically, the AP window tumor measures about 3.5 by 2.5 cm on image 96 series 3 and has maximum SUV of 13.6. Previously the focal activity in this vicinity had an SUV of about 14.3. Multifocal activity along the left hilum is present with a suprahilar lesion with maximum SUV of 13.9 measuring about 1.6 cm in diameter, and nodularity tracking adjacent to the right pulmonary vein in the infrahilar region anteriorly with maximum SUV of 9.8. Atelectatic component of the left upper lobe with low-grade internal activity, SUV about 3.1. A peripheral left lower lobe nodule seen on recent chest CT of 09/05/2020 measures about 0.8 cm in diameter on image 108 series 3, and has a maximum SUV of 4.2, compatible with a metastatic lesion. A paraesophageal lymph node measuring about 0.6 cm in short axis on image 124 of series 3 has a maximum SUV of 5.6, compatible with malignancy. Incidental CT findings: Coronary, aortic arch, and branch vessel atherosclerotic vascular disease. Mild cardiomegaly. 15% left pneumothorax primarily seen along the base and the apex, mildly increased compared to 09/05/2020. ABDOMEN/PELVIS: Small but persistent focus of accentuated metabolic activity in the distal sigmoid colon, less than 1 cm in diameter, maximum SUV 6.8 and previously 5.8, quite likely a small colon polyp. Incidental CT findings: Aortoiliac atherosclerotic vascular disease. Photopenic right mid kidney cyst. SKELETON: No significant abnormal hypermetabolic activity in this region. Incidental CT findings: Bridging spurring of both sacroiliac joints. 0.5 cm sclerotic lesion in the right iliac bone on image 216 series 3 is unchanged from 10/14/2019 and not hypermetabolic. This is likely a benign bone island or similar benign lesion. Lumbar spondylosis and degenerative disc disease. IMPRESSION: 1. The previous 5-10% left  hydropneumothorax has increased to about 15% of  left hemithoracic volume today. No findings of tension component. Continued left upper lobe volume loss and post radiation therapy findings. 2. Active hypermetabolic malignancy in the left AP window and left hilum, along with a hypermetabolic 8 mm left lower lobe nodule favoring a metastatic lesion. Small but hypermetabolic lower paraesophageal lymph node. 3. Hypodensity in the right frontal lobe on the CT data, with some heterogeneous/increased activity in this vicinity on the PET data, raising the possibility of intracranial metastatic disease. MRI brain with and without contrast is recommended for further characterization. 4. Unusual appearance of the vocal cords which are no high in density and demonstrate hypermetabolic activity. Query laryngoplasty/vocal cord injection, versus interval calcification associated with inflammation. Has the Alan [name] had any procedure or changes in phonation? 5. Sub-centimeter focal hypermetabolic activity in the distal sigmoid colon, roughly stable from prior, suspicious for a small colonic polyp. 6. Aortic Atherosclerosis (ICD10-I70.0). Coronary atherosclerosis with mild cardiomegaly. Electronically Signed   By: Van Clines M.D.   On: 09/21/2020 16:30     ASSESSMENT:  1.  Left lung cancer: -Presentation to the ER on 10/06/2019 with cough and dizziness. -CT chest with contrast on 10/06/2019 showed large left suprahilar mass completely obstructing left upper lobe bronchus, measuring 7 x 5.3 x 5.8 cm.  Mass extends into AP window and narrows the left mainstem bronchus.  Small subpleural nodule in the left lower lobe measuring 4 mm indeterminate.  AP window nodal mass measuring 3.4 cm.  No supraclavicular adenopathy.  Subcarinal enlarged lymph node measures 1.4 cm. -PET scan on 10/14/2019 shows large hypermetabolic left upper lobe mass surrounding the left hilum, extending into pleural surface not invading the chest wall.  Direct invasion into the AP window and metastatic  subcarinal lymph node, T4 N2 M0.  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 -10 pound weight loss in the last 6 months.  Hoarseness for last 1 week. -MRI of the brain on 10/28/2019 with no evidence of metastasis. -Concurrent chemoradiation therapy started on 11/28/2019. Completed 01/02/20 ( chemo) 01/06/2020(radiation) -CT chest on 01/20/2020 showed significant interval decrease in consolidation of the left upper lobe, interval decrease in irregular nodularity and consolidation of the left lower lobe.  Unchanged posttreatment appearance of soft tissue about the left hilum and AP window.  No discretely enlarged mediastinal or hilar lymph nodes.  Trace left pleural effusion, decreased compared to prior exam. -Consolidation durvalumab started on 01/30/2020. -CT chest with contrast on 03/30/2020 with the development of small left-sided pleural effusion with further volume loss.  Bandlike scarring in the left lower lobe since prior study likely posttreatment changes.  Small bone lesion at T1 stable.  1 small nodule in the right chest which is new about 4 mm.  Will monitor.   2.  Social/family history: -Alan [name] smoked 1 pack/day for 45 years, quit 5 years ago. -No family history of malignancies.     PLAN:  1.  Advanced squamous cell carcinoma of the left lung: - PET scan on 09/20/2020 showed hypermetabolic lymph node in the left AP window and left hilum along with hypermetabolic 8 mm left lower lobe nodule.  Small hypermetabolic lower paraesophageal lymph node.  Hypodensity in the right frontal area with some increased activity. - We talked about changing his treatment to docetaxel and Cyramza. - We discussed side effects in detail. - We will start him on docetaxel today at 60 mg per metered square.  I will hold off on Cyramza today.  Will increase docetaxel dose to 75 mg per metered square and introduce Cyramza at next treatment.  RTC 3 weeks for  follow-up.    2.  Left hydropneumothorax: - CT scan 2 weeks ago showed 5 to 10% left hydropneumothorax.  Current PET scan showed increased to 15%. - Alan [name] is completely asymptomatic with saturations 100% on room air. - We have made appointment for CT surgery evaluation.     3.  Atrial fibrillation: - Continue Eliquis 5 mg twice daily.  No bleeding issues.   4.  Weight loss: - Continue Marinol 5 mg twice daily.   5.  Hypertension: - Continue metoprolol 1 tablet in the morning and half tablet in the evening.   6.  Hypothyroidism: - Continue Synthroid 100 mcg daily.  TSH is 7.4 today.  We will closely monitor.  7.  Cough: - Continue hydrocodone syrup.  This is well controlled.  8.  Right frontal lobe mass: - We reviewed MRI images and report from 10/02/2020 which showed 3 cm right frontal lobe metastasis with edema and mild mass-effect with 5 mm leftward midline shift. - Alan [name] denies any weakness, vision changes or headaches. - We will start him on dexamethasone 4 mg 3 times daily. - We have made a referral to radiation oncology for SBRT.   Orders placed this encounter:  No orders of the defined types were placed in this encounter.    Derek Jack, MD [place] [phone]   I, Thana Ates, am acting as a scribe for Dr. Derek Jack.  I, Derek Jack MD, have reviewed the above documentation for accuracy and completeness, and I agree with the above.

## 2020-10-02 NOTE — Progress Notes (Signed)
[facility] called with results on patients MRI of head with and without contrast.   Results: Right frontal lobe 3 cm metastasis with edema and mild mass effect including subcentimeter leftward midline shift  Dr. Delton Coombes aware  called pharmacy with prescription of dexamethasone 4mg  tablets #60. take 1 tablet q6hours.  Patients next appointment 10/03/2020.

## 2020-10-03 ENCOUNTER — Inpatient Hospital Stay (HOSPITAL_COMMUNITY): Payer: Medicare Other

## 2020-10-03 ENCOUNTER — Inpatient Hospital Stay (HOSPITAL_BASED_OUTPATIENT_CLINIC_OR_DEPARTMENT_OTHER): Payer: Medicare Other | Admitting: Hematology

## 2020-10-03 VITALS — BP 140/75 | HR 63 | Temp 96.8°F | Resp 17 | Wt 160.2 lb

## 2020-10-03 VITALS — BP 121/83 | HR 60 | Temp 97.6°F | Resp 18

## 2020-10-03 DIAGNOSIS — Z5111 Encounter for antineoplastic chemotherapy: Secondary | ICD-10-CM | POA: Diagnosis not present

## 2020-10-03 DIAGNOSIS — Z95828 Presence of other vascular implants and grafts: Secondary | ICD-10-CM

## 2020-10-03 DIAGNOSIS — C3492 Malignant neoplasm of unspecified part of left bronchus or lung: Secondary | ICD-10-CM | POA: Diagnosis not present

## 2020-10-03 LAB — MAGNESIUM: Magnesium: 1.8 mg/dL (ref 1.7–2.4)

## 2020-10-03 LAB — CBC WITH DIFFERENTIAL/PLATELET
Abs Immature Granulocytes: 0.05 10*3/uL (ref 0.00–0.07)
Basophils Absolute: 0.1 10*3/uL (ref 0.0–0.1)
Basophils Relative: 1 %
Eosinophils Absolute: 0.1 10*3/uL (ref 0.0–0.5)
Eosinophils Relative: 1 %
HCT: 40.9 % (ref 39.0–52.0)
Hemoglobin: 13.8 g/dL (ref 13.0–17.0)
Immature Granulocytes: 1 %
Lymphocytes Relative: 6 %
Lymphs Abs: 0.5 10*3/uL — ABNORMAL LOW (ref 0.7–4.0)
MCH: 32.1 pg (ref 26.0–34.0)
MCHC: 33.7 g/dL (ref 30.0–36.0)
MCV: 95.1 fL (ref 80.0–100.0)
Monocytes Absolute: 0.6 10*3/uL (ref 0.1–1.0)
Monocytes Relative: 7 %
Neutro Abs: 7.3 10*3/uL (ref 1.7–7.7)
Neutrophils Relative %: 84 %
Platelets: 139 10*3/uL — ABNORMAL LOW (ref 150–400)
RBC: 4.3 MIL/uL (ref 4.22–5.81)
RDW: 13.2 % (ref 11.5–15.5)
WBC: 8.7 10*3/uL (ref 4.0–10.5)
nRBC: 0 % (ref 0.0–0.2)

## 2020-10-03 LAB — COMPREHENSIVE METABOLIC PANEL
ALT: 14 U/L (ref 0–44)
AST: 17 U/L (ref 15–41)
Albumin: 3.9 g/dL (ref 3.5–5.0)
Alkaline Phosphatase: 67 U/L (ref 38–126)
Anion gap: 6 (ref 5–15)
BUN: 16 mg/dL (ref 8–23)
CO2: 29 mmol/L (ref 22–32)
Calcium: 8.9 mg/dL (ref 8.9–10.3)
Chloride: 98 mmol/L (ref 98–111)
Creatinine, Ser: 1.28 mg/dL — ABNORMAL HIGH (ref 0.61–1.24)
GFR, Estimated: 60 mL/min — ABNORMAL LOW (ref >60)
Glucose, Bld: 103 mg/dL — ABNORMAL HIGH (ref 70–99)
Potassium: 3.9 mmol/L (ref 3.5–5.1)
Sodium: 133 mmol/L — ABNORMAL LOW (ref 135–145)
Total Bilirubin: 1.1 mg/dL (ref 0.3–1.2)
Total Protein: 7.3 g/dL (ref 6.5–8.1)

## 2020-10-03 LAB — URINALYSIS, DIPSTICK ONLY
Bilirubin Urine: NEGATIVE
Glucose, UA: NEGATIVE mg/dL
Hgb urine dipstick: NEGATIVE
Ketones, ur: NEGATIVE mg/dL
Leukocytes,Ua: NEGATIVE
Nitrite: NEGATIVE
Protein, ur: NEGATIVE mg/dL
Specific Gravity, Urine: 1.013 (ref 1.005–1.030)
pH: 5 (ref 5.0–8.0)

## 2020-10-03 LAB — TSH: TSH: 7.458 u[IU]/mL — ABNORMAL HIGH (ref 0.350–4.500)

## 2020-10-03 MED ORDER — DIPHENHYDRAMINE HCL 50 MG/ML IJ SOLN
50.0000 mg | Freq: Once | INTRAMUSCULAR | Status: AC
  Administered 2020-10-03: 50 mg via INTRAVENOUS
  Filled 2020-10-03: qty 1

## 2020-10-03 MED ORDER — SODIUM CHLORIDE 0.9 % IV SOLN: Freq: Once | INTRAVENOUS | Status: AC

## 2020-10-03 MED ORDER — ACETAMINOPHEN 325 MG PO TABS
650.0000 mg | ORAL_TABLET | Freq: Once | ORAL | Status: AC
  Administered 2020-10-03: 650 mg via ORAL
  Filled 2020-10-03: qty 2

## 2020-10-03 MED ORDER — SODIUM CHLORIDE 0.9 % IV SOLN
10.0000 mg | Freq: Once | INTRAVENOUS | Status: AC
  Administered 2020-10-03: 10 mg via INTRAVENOUS
  Filled 2020-10-03: qty 10

## 2020-10-03 MED ORDER — HEPARIN SOD (PORK) LOCK FLUSH 100 UNIT/ML IV SOLN
500.0000 [IU] | Freq: Once | INTRAVENOUS | Status: AC | PRN
  Administered 2020-10-03: 500 [IU]

## 2020-10-03 MED ORDER — SODIUM CHLORIDE 0.9 % IV SOLN
60.0000 mg/m2 | Freq: Once | INTRAVENOUS | Status: AC
  Administered 2020-10-03: 110 mg via INTRAVENOUS
  Filled 2020-10-03: qty 11

## 2020-10-03 MED ORDER — SODIUM CHLORIDE 0.9% FLUSH
10.0000 mL | INTRAVENOUS | Status: DC | PRN
  Administered 2020-10-03: 10 mL

## 2020-10-03 NOTE — Patient Instructions (Signed)
[place]  Discharge Instructions: Thank you for choosing [facility] to provide your oncology and hematology care.  If you have a lab appointment with the [facility], please come in thru the Main Entrance and check in at the main information desk.  Wear comfortable clothing and clothing appropriate for easy access to any Portacath or PICC line.   We strive to give you quality time with your provider. You may need to reschedule your appointment if you arrive late (15 or more minutes).  Arriving late affects you and other patients whose appointments are after yours.  Also, if you miss three or more appointments without notifying the office, you may be dismissed from the clinic at the provider's discretion.      For prescription refill requests, have your pharmacy contact our office and allow 72 hours for refills to be completed.    Today you received the following chemotherapy and/or immunotherapy agents Docetaxel. Return as scheduled.   To help prevent nausea and vomiting after your treatment, we encourage you to take your nausea medication as directed.  BELOW ARE SYMPTOMS THAT SHOULD BE REPORTED IMMEDIATELY: *FEVER GREATER THAN 100.4 F (38 C) OR HIGHER *CHILLS OR SWEATING *NAUSEA AND VOMITING THAT IS NOT CONTROLLED WITH YOUR NAUSEA MEDICATION *UNUSUAL SHORTNESS OF BREATH *UNUSUAL BRUISING OR BLEEDING *URINARY PROBLEMS (pain or burning when urinating, or frequent urination) *BOWEL PROBLEMS (unusual diarrhea, constipation, pain near the anus) TENDERNESS IN MOUTH AND THROAT WITH OR WITHOUT PRESENCE OF ULCERS (sore throat, sores in mouth, or a toothache) UNUSUAL RASH, SWELLING OR PAIN  UNUSUAL VAGINAL DISCHARGE OR ITCHING   Items with * indicate a potential emergency and should be followed up as soon as possible or go to the Emergency Department if any problems should occur.  Please show the CHEMOTHERAPY ALERT CARD or IMMUNOTHERAPY ALERT CARD at check-in to  the Emergency Department and triage nurse.  Should you have questions after your visit or need to cancel or reschedule your appointment, please contact [facility] [phone]  and follow the prompts.  Office hours are 8:00 a.m. to 4:30 p.m. Monday - Friday. Please note that voicemails left after 4:00 p.m. may not be returned until the following business day.  We are closed weekends and major holidays. You have access to a nurse at all times for urgent questions. Please call the main number to the clinic [phone] and follow the prompts.  For any non-urgent questions, you may also contact your provider using MyChart. We now offer e-Visits for anyone 62 and older to request care online for non-urgent symptoms. For details visit [email or web address].   Also download the MyChart app! Go to the app store, search "MyChart", open the app, select [facility], and log in with your MyChart username and password.  Due to Covid, a mask is required upon entering the hospital/clinic. If you do not have a mask, one will be given to you upon arrival. For doctor visits, patients may have 1 support person aged 29 or older with them. For treatment visits, patients cannot have anyone with them due to current Covid guidelines and our immunocompromised population.

## 2020-10-03 NOTE — Patient Instructions (Addendum)
[place] at [facility] Discharge Instructions  You were seen today by Dr. Delton Coombes. He went over your recent results and scans, and you received your treatment. Dr. Delton Coombes will see you back in 3 weeks for labs and follow up.   Thank you for choosing [place] at [facility] to provide your oncology and hematology care.  To afford each patient quality time with our provider, please arrive at least 15 minutes before your scheduled appointment time.   If you have a lab appointment with the [facility] please come in thru the Main Entrance and check in at the main information desk  You need to re-schedule your appointment should you arrive 10 or more minutes late.  We strive to give you quality time with our providers, and arriving late affects you and other patients whose appointments are after yours.  Also, if you no show three or more times for appointments you may be dismissed from the clinic at the providers discretion.     Again, thank you for choosing [facility].  Our hope is that these requests will decrease the amount of time that you wait before being seen by our physicians.       _____________________________________________________________  Should you have questions after your visit to [facility], please contact our office at [phone] between the hours of 8:00 a.m. and 4:30 p.m.  Voicemails left after 4:00 p.m. will not be returned until the following business day.  For prescription refill requests, have your pharmacy contact our office and allow 72 hours.    Cancer Center Support Programs:   > Cancer Support Group  2nd Tuesday of the month 1pm-2pm, Journey Room

## 2020-10-03 NOTE — Progress Notes (Signed)
Patient arrives for treatment today. Labs within treatment parameters. Per Dr. Delton Coombes okay to treat, patient only to received Docetaxel today. Consent signed by patient.  Patient tolerated chemotherapy with no complaints voiced. Side effects with management reviewed understanding verbalized. Port site clean and dry with no bruising or swelling noted at site. Good blood return noted before and after administration of chemotherapy. Band aid applied. Patient left in satisfactory condition with VSS and no s/s of distress noted.

## 2020-10-04 LAB — T4: T4, Total: 5.9 ug/dL (ref 4.5–12.0)

## 2020-10-04 NOTE — Progress Notes (Signed)
Patient called for 24 hr follow up call. No answer from patient.

## 2020-10-09 ENCOUNTER — Other Ambulatory Visit (HOSPITAL_COMMUNITY): Payer: Self-pay | Admitting: Hematology

## 2020-10-09 ENCOUNTER — Other Ambulatory Visit: Payer: Self-pay | Admitting: Radiation Therapy

## 2020-10-09 ENCOUNTER — Ambulatory Visit (HOSPITAL_COMMUNITY): Payer: Medicare Other

## 2020-10-09 ENCOUNTER — Other Ambulatory Visit (HOSPITAL_COMMUNITY): Payer: Medicare Other

## 2020-10-09 ENCOUNTER — Telehealth (HOSPITAL_COMMUNITY): Payer: Self-pay | Admitting: *Deleted

## 2020-10-09 ENCOUNTER — Ambulatory Visit (HOSPITAL_COMMUNITY): Payer: Medicare Other | Admitting: Hematology

## 2020-10-09 DIAGNOSIS — C7931 Secondary malignant neoplasm of brain: Secondary | ICD-10-CM

## 2020-10-09 MED ORDER — CHLORPROMAZINE HCL 25 MG PO TABS: 25.0000 mg | ORAL_TABLET | Freq: Four times a day (QID) | ORAL | 0 refills | Status: DC | PRN

## 2020-10-09 NOTE — Telephone Encounter (Signed)
Wife called to advise that patient has had intractable hiccups since chemo yesterday.  Dr Delton Coombes made aware and thorazine 25 mg Q 6 prn was sent to [facility] in [place].  Wife verbalized understanding of instructions.

## 2020-10-09 NOTE — Telephone Encounter (Signed)
Opened in error

## 2020-10-10 ENCOUNTER — Other Ambulatory Visit: Payer: Self-pay | Admitting: Radiation Therapy

## 2020-10-10 DIAGNOSIS — C7931 Secondary malignant neoplasm of brain: Secondary | ICD-10-CM

## 2020-10-10 NOTE — Progress Notes (Signed)
Orders placed to acces port the day of brain MRI at [facility].

## 2020-10-10 NOTE — Progress Notes (Signed)
Location/Histology of Brain Tumor: Right Frontal  Left Lung cancer primary diagnosed 09/2019  MRI Brain 10/02/2020: Right frontal lobe 3 cm metastasis with edema and mild mass effect including sub-centimeter leftward midline shift.  PET 09/17/8561: Hypermetabolic lymph node in the left AP Window and left hilum along the hypermetabolic 8 mm left lower lobe nodule.  Small hypermetabolic lower paraesophageal lymph node.  MRI Brain 10/28/2019: No evidence of metastasis     Past or anticipated interventions, if any, per neurosurgery:   Past or anticipated interventions, if any, per medical oncology:  Dr. Delton Coombes 10/03/2020 Squamous cell carcinoma of the Left Lung - PET scan on 09/20/2020 showed hypermetabolic lymph node in the left AP window and left hilum along with hypermetabolic 8 mm left lower lobe nodule.  Small hypermetabolic lower paraesophageal lymph node.  Hypodensity in the right frontal area with some increased activity. - We talked about changing his treatment to docetaxel and Cyramza. - We discussed side effects in detail. - We will start him on docetaxel today at 60 mg per metered square.  I will hold off on Cyramza today.  Will increase docetaxel dose to 75 mg per metered square and introduce Cyramza at next treatment.  RTC 3 weeks for follow-up. Right Frontal Mass - We reviewed MRI images and report from 10/02/2020 which showed 3 cm right frontal lobe metastasis with edema and mild mass-effect with 5 mm leftward midline shift. - He denies any weakness, vision changes or headaches. - We will start him on dexamethasone 4 mg 3 times daily. - We have made a referral to radiation oncology for SBRT.  -Chemo 10/11-11/15/2021 -01/30/20-09/11/2020 -10/03/2020-  Dose of Decadron, if applicable: 4mg  TID  Recent neurologic symptoms, if any:  Seizures: No Headaches: No Nausea: No Dizziness/ataxia: Has some dizziness and unsteady gait. Difficulty with hand coordination: No Focal  numbness/weakness: Numbness noted in his fingers. Visual deficits/changes: No Confusion/Memory deficits: No  Signs/Symptoms Weight changes, if any: Has lost a few pounds over the last month. Respiratory complaints, if any: Has occasional SOB and wheezing when up moving about. Hemoptysis, if any: Notes non-productive cough, denies hemoptysis. Pain issues, if any:  No  SAFETY ISSUES: Prior radiation? Left Lobe 2021 [place], ~5-6 weeks Pacemaker/ICD? No Possible current pregnancy? N/a Is the patient on methotrexate? No  Additional Complaints / other details:  Port Insertion 10/2019

## 2020-10-11 ENCOUNTER — Ambulatory Visit
Admission: RE | Admit: 2020-10-11 | Discharge: 2020-10-11 | Disposition: A | Discharge status: Home / Self Care | Payer: Medicare Other | Source: Ambulatory Visit | Attending: Radiation Oncology | Admitting: Radiation Oncology

## 2020-10-11 ENCOUNTER — Encounter: Payer: Medicare Other | Admitting: Thoracic Surgery (Cardiothoracic Vascular Surgery)

## 2020-10-11 ENCOUNTER — Other Ambulatory Visit: Payer: Self-pay

## 2020-10-11 ENCOUNTER — Encounter: Payer: Self-pay | Admitting: Radiation Oncology

## 2020-10-11 VITALS — Ht 65.0 in | Wt 160.0 lb

## 2020-10-11 DIAGNOSIS — C7931 Secondary malignant neoplasm of brain: Secondary | ICD-10-CM

## 2020-10-11 DIAGNOSIS — C3492 Malignant neoplasm of unspecified part of left bronchus or lung: Secondary | ICD-10-CM

## 2020-10-11 NOTE — Progress Notes (Signed)
Radiation Oncology         [phone] ________________________________  Initial Outpatient Consultation - Conducted via telephone due to current COVID-19 concerns for limiting patient exposure  I spoke with the patient to conduct this consult visit via telephone to spare the patient unnecessary potential exposure in the healthcare setting during the current COVID-19 pandemic. The patient was notified in advance and was offered a [facility] meeting to allow for face to face communication but unfortunately reported that they did not have the appropriate resources/technology to support such a visit and instead preferred to proceed with a telephone consult.     Name: Alan [name]        MRN: [id number]  Date of Service: 10/11/2020 DOB: [birth year]  Alan [name], [facility]  Alan Jack, MD     REFERRING PHYSICIAN: Derek Jack, MD   DIAGNOSIS: The primary encounter diagnosis was Squamous cell lung cancer, left ([place]). Diagnoses of Metastasis to brain [facility]) and Brain metastasis ([place]) were also pertinent to this visit.   HISTORY OF PRESENT ILLNESS: Alan [name] is a 70 y.o. male seen at the request of Dr. Delton Coombes for newly noted solitary right frontal lobe metastasis. The patient has a history of Stage IIIB, cT4N2M0, NSCLC, squamous cell carcinoma of the left upper lobe. He received  chemoRT followed by immunotherapy consolidation with his last dose on 09/11/20. He was found to have progressive changes in the chest on staging scan in July, and PET on 09/20/20 showed hypermetabolic activity in the left AP window and a density in the right frontal lobe, he is also undergoing a work-up for persistent hoarseness and there was an unusual appearance of the vocal cords with hypermetabolism. An MRI on 10/02/2020 showed a solitary lesion in the right frontal lobe measuring 2.7 x 3 cm with extensive surrounding vasogenic edema and regional mass-effect with 5 mm left to right shift was  seen.  No hydrocephalus was identified.  He was started on dexamethasone 4 mg 3 times a day he is contacted today by phone to discuss treatment with fractionated preoperative SRS (Stereotactic Radiosurgery).  Alan [name] is also waiting on his case and recommends resection at the conclusion of radiotherapy.  He is scheduled for a 3T MRI scan of the brain tomorrow and for simulation next Monday.    PREVIOUS RADIATION THERAPY: Yes   11/28/19-01/02/20: The left lung and regional nodes were treated at [facility] with Dr. Adella Nissen, details on dosing is unknown.   PAST MEDICAL HISTORY:  Past Medical History:  Diagnosis Date   Arthritis    Hyperlipemia    Hypertension    Hyperthyroidism    Lung cancer ([place])    Myocardial infarction ([place])    Paroxysmal atrial fibrillation ([place])    Port-A-Cath in place 11/14/2019   Tachycardia        PAST SURGICAL HISTORY: Past Surgical History:  Procedure Laterality Date   CARDIAC CATHETERIZATION     PORTACATH PLACEMENT Right 11/01/2019   Procedure: INSERTION PORT-A-CATH;  Surgeon: Aviva Signs, MD;  Location: [facility];  Service: General;  Laterality: Right;     FAMILY HISTORY:  Family History  Problem Relation Age of Onset   Hypertension Father    Dementia Father        died age 57   Heart disease Father    Heart failure Other      SOCIAL HISTORY:  reports that he quit smoking about 5 years ago. His smoking use included cigars and cigarettes. He has  never used smokeless tobacco. He reports that he does not drink alcohol and does not use drugs.  The patient is married and lives in [place].  He and his wife babysit for a grandchild.   ALLERGIES: Patient has no known allergies.   MEDICATIONS:  Current Outpatient Medications  Medication Sig Dispense Refill   acetaminophen (TYLENOL) 500 MG tablet Take 500 mg by mouth every 6 (six) hours as needed for moderate pain or headache.     albuterol (PROVENTIL) (2.5 MG/3ML) 0.083% nebulizer  solution Take 3 mLs (2.5 mg total) by nebulization every 6 (six) hours as needed for wheezing or shortness of breath. 75 mL 12   ALPRAZolam (XANAX) 0.25 MG tablet TAKE 1 TABLET BY MOUTH THREE TIMES DAILY AS NEEDED FOR ANXIETY 30 tablet 0   chlorproMAZINE (THORAZINE) 25 MG tablet Take 1 tablet (25 mg total) by mouth every 6 (six) hours as needed. 30 tablet 0   dexamethasone (DECADRON) 4 MG tablet Take 1 tablet (4 mg total) by mouth every 6 (six) hours. 60 tablet 0   dronabinol (MARINOL) 5 MG capsule Take 1 capsule (5 mg total) by mouth 2 (two) times daily before a meal. 60 capsule 2   Durvalumab (IMFINZI IV) Inject into the vein.     ELIQUIS 5 MG TABS tablet Take 1 tablet by mouth twice daily 60 tablet 5   HYCODAN 5-1.5 MG/5ML syrup take 15 mls BY MOUTH EVERY 8 HOURS AS NEEDED FOR cough 473 mL 0   levothyroxine (SYNTHROID) 100 MCG tablet Take 1 tablet (100 mcg total) by mouth daily before breakfast. 30 tablet 3   lidocaine (XYLOCAINE) 2 % solution TAKE 15 ML BY MOUTH  4 TIMES DAILY WITH MEALS 480 mL 0   lidocaine-prilocaine (EMLA) cream APPLY SMALL AMOUNT TO PORTA-CATH SITE AND COVER WITH PLASTIC WRAP 1 HOUR PRIOR TO CHEMO APPOINTMENTS. 30 g 6   Melatonin 10 MG TABS Take 1 tablet by mouth at bedtime.     metoprolol tartrate (LOPRESSOR) 25 MG tablet Take one tablet(25 mg )  in the AM and take 1/2 Tablet (12.5mg ) in the PM 135 tablet 3   No current facility-administered medications for this encounter.     REVIEW OF SYSTEMS: On review of systems, the patient reports that he is doing okay.  He reports his breathing is status quo.  He continues to have hoarseness which is being worked up by ENT.  He denies any headaches seizures or unintended movements.  He is taking dexamethasone 3 times a day and has not noticed any significant improvement in the symptoms that he has of dizziness and feeling unsteady when he is walking.  He has not had any confusion visual or auditory changes.  No other complaints are  verbalized.     PHYSICAL EXAM:  Wt Readings from Last 3 Encounters:  10/11/20 160 lb (72.6 kg)  10/03/20 160 lb 3.2 oz (72.7 kg)  09/25/20 162 lb 6.4 oz (73.7 kg)  Unable to assess given encounter type.  ECOG = 1  0 - Asymptomatic (Fully active, able to carry on all predisease activities without restriction)  1 - Symptomatic but completely ambulatory (Restricted in physically strenuous activity but ambulatory and able to carry out work of a light or sedentary nature. For example, light housework, office work)  2 - Symptomatic, <50% in bed during the day (Ambulatory and capable of all self care but unable to carry out any work activities. Up and about more than 50% of waking hours)  3 - Symptomatic, >50% in bed, but not bedbound (Capable of only limited self-care, confined to bed or chair 50% or more of waking hours)  4 - Bedbound (Completely disabled. Cannot carry on any self-care. Totally confined to bed or chair)  5 - Death   Alan [name] MM, Creech RH, Tormey DC, et al. [phone]). "Toxicity and response criteria of the [facility]". [place] Oncol. 5 (6): 649-55    LABORATORY DATA:  Lab Results  Component Value Date   WBC 8.7 10/03/2020   HGB 13.8 10/03/2020   HCT 40.9 10/03/2020   MCV 95.1 10/03/2020   PLT 139 (L) 10/03/2020   Lab Results  Component Value Date   NA 133 (L) 10/03/2020   K 3.9 10/03/2020   CL 98 10/03/2020   CO2 29 10/03/2020   Lab Results  Component Value Date   ALT 14 10/03/2020   AST 17 10/03/2020   ALKPHOS 67 10/03/2020   BILITOT 1.1 10/03/2020      RADIOGRAPHY: MR Brain W Wo Contrast  Result Date: 10/02/2020 CLINICAL DATA:  Non-small cell lung cancer, metastatic disease evaluation, possible abnormality on PET EXAM: MRI HEAD WITHOUT AND WITH CONTRAST TECHNIQUE: Multiplanar, multiecho pulse sequences of the brain and surrounding structures were obtained without and with intravenous contrast. CONTRAST:  71mL GADAVIST  GADOBUTROL 1 MMOL/ML IV SOLN COMPARISON:  September 2021 FINDINGS: Brain: There is a centrally necrotic, enhancing lesion of the right frontal lobe centered within the white matter. This measures 2.7 x 3 cm. There is extensive surrounding edema. Regional mass effect is present including 5 mm of leftward midline shift. No hydrocephalus. There is no acute infarction or intracranial hemorrhage. Additional patchy T2 hyperintensity in the supratentorial white matter likely reflects stable minor chronic microvascular ischemic changes. Vascular: Major vessel flow voids at the skull base are preserved. Skull and upper cervical spine: Normal marrow signal is preserved. Sinuses/Orbits: Paranasal sinuses are aerated. Orbits are unremarkable. Other: Sella is unremarkable.  Mastoid air cells are clear. IMPRESSION: Right frontal lobe 3 cm metastasis with edema and mild mass effect including subcentimeter leftward midline shift. These results will be called to the ordering clinician or representative by the Radiologist Assistant, and communication documented in the PACS or [employer]. Electronically Signed   By: Macy Mis M.D.   On: 10/02/2020 12:35   NM PET Image Restag (PS) Skull Base To Thigh  Result Date: 09/21/2020 CLINICAL DATA:  Subsequent treatment strategy for non-small cell lung cancer. Ongoing immunotherapy. Radiation therapy in December 2021. Chemotherapy in December 2021. EXAM: NUCLEAR MEDICINE PET SKULL BASE TO THIGH TECHNIQUE: 8.6 mCi F-18 FDG was injected intravenously. Full-ring PET imaging was performed from the skull base to thigh after the radiotracer. CT data was obtained and used for attenuation correction and anatomic localization. Fasting blood glucose: 114 mg/dl COMPARISON:  Multiple exams, including CT scan 09/05/2020 and prior PET-CT from 10/14/2019 FINDINGS: Mediastinal blood pool activity: SUV max 2.3 Liver activity: SUV max NA NECK: There is new unusual high density in the vocal cords as  on image 65 series 3, retrospectively visible on 09/05/2020 but not on 07/20/2020, associated with accentuated metabolic activity. The right focal cord has maximum SUV of 9.6 with the left having a maximum SUV of 3.9. Possibilities might include laryngoplasty/vocal cord injection or calcification associated with inflammation and resulting hypermetabolic activity-correlate with patient's procedural history and any changes in phonation. Possible accentuated activity in the right frontal white matter on image 320 of the attenuation corrected  series, with hypodensity in this vicinity on the CT data which could possibly represent vasogenic edema. Brain imaging by MRI or CT with and without contrast is recommended to exclude intracranial metastatic disease. No lesion was evident in this region on the prior MRI brain from 10/28/2019. Incidental CT findings: Bilateral common carotid atherosclerotic calcification. CHEST: Although there has been substantial reduction in size of the left upper lobe mass compared to the prior PET-CT, there is hypermetabolic residual tumor in the AP window and left hilar region. Specifically, the AP window tumor measures about 3.5 by 2.5 cm on image 96 series 3 and has maximum SUV of 13.6. Previously the focal activity in this vicinity had an SUV of about 14.3. Multifocal activity along the left hilum is present with a suprahilar lesion with maximum SUV of 13.9 measuring about 1.6 cm in diameter, and nodularity tracking adjacent to the right pulmonary vein in the infrahilar region anteriorly with maximum SUV of 9.8. Atelectatic component of the left upper lobe with low-grade internal activity, SUV about 3.1. A peripheral left lower lobe nodule seen on recent chest CT of 09/05/2020 measures about 0.8 cm in diameter on image 108 series 3, and has a maximum SUV of 4.2, compatible with a metastatic lesion. A paraesophageal lymph node measuring about 0.6 cm in short axis on image 124 of series 3 has  a maximum SUV of 5.6, compatible with malignancy. Incidental CT findings: Coronary, aortic arch, and branch vessel atherosclerotic vascular disease. Mild cardiomegaly. 15% left pneumothorax primarily seen along the base and the apex, mildly increased compared to 09/05/2020. ABDOMEN/PELVIS: Small but persistent focus of accentuated metabolic activity in the distal sigmoid colon, less than 1 cm in diameter, maximum SUV 6.8 and previously 5.8, quite likely a small colon polyp. Incidental CT findings: Aortoiliac atherosclerotic vascular disease. Photopenic right mid kidney cyst. SKELETON: No significant abnormal hypermetabolic activity in this region. Incidental CT findings: Bridging spurring of both sacroiliac joints. 0.5 cm sclerotic lesion in the right iliac bone on image 216 series 3 is unchanged from 10/14/2019 and not hypermetabolic. This is likely a benign bone island or similar benign lesion. Lumbar spondylosis and degenerative disc disease. IMPRESSION: 1. The previous 5-10% left hydropneumothorax has increased to about 15% of left hemithoracic volume today. No findings of tension component. Continued left upper lobe volume loss and post radiation therapy findings. 2. Active hypermetabolic malignancy in the left AP window and left hilum, along with a hypermetabolic 8 mm left lower lobe nodule favoring a metastatic lesion. Small but hypermetabolic lower paraesophageal lymph node. 3. Hypodensity in the right frontal lobe on the CT data, with some heterogeneous/increased activity in this vicinity on the PET data, raising the possibility of intracranial metastatic disease. MRI brain with and without contrast is recommended for further characterization. 4. Unusual appearance of the vocal cords which are no high in density and demonstrate hypermetabolic activity. Query laryngoplasty/vocal cord injection, versus interval calcification associated with inflammation. Has the patient had any procedure or changes in  phonation? 5. Sub-centimeter focal hypermetabolic activity in the distal sigmoid colon, roughly stable from prior, suspicious for a small colonic polyp. 6. Aortic Atherosclerosis (ICD10-I70.0). Coronary atherosclerosis with mild cardiomegaly. Electronically Signed   By: Van Clines M.D.   On: 09/21/2020 16:30       IMPRESSION/PLAN: 1. 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 Alan [name] discusses the patient's course including prior radiotherapy and treatment to date.  Given progression in  the chest he is also going to be resuming systemic therapy. He has received his first treatment on 10/03/2020 of Taxotere.  Alan [name] discusses the rationale for considering fractionated stereotactic radiosurgery in the preoperative setting, Alan [name] has recommended resection of the tumor based on size and location. We discussed the risks, benefits, short, and long term effects of Preoperative SRS, as well as the curative intent, and the patient is interested in proceeding. Alan [name] discusses the delivery and logistics of radiotherapy and anticipates a course of 3 fractions.  The patient will proceed with 3T MRI tomorrow, his case will be discussed in multidisciplinary brain and spine oncology conference on Monday morning.  He will come in Monday for simulation at which time he will sign written consent to proceed and see Alan [name] also early next week his first treatment will be next Thursday.   Given current concerns for patient exposure during the COVID-19 pandemic, this encounter was conducted via telephone.  The patient has provided two factor identification and has given verbal consent for this type of encounter and has been advised to only accept a meeting of this type in a secure network environment. The time spent during this encounter was 60 minutes including preparation, discussion, and coordination of the patient's  care. The attendants for this meeting include Alan Nicely, RN, Alan [name], Alan [name] , Alan [name], RT, and Alan [name] and his wife Alan [name].  During the encounter,  Alan Nicely, RN, Alan [name],  Alan [name], RT and Alan [name] were located at [facility] Radiation Oncology Department.  Alan [name] was located at home with his wife Alan [name].   The above documentation reflects my direct findings during this shared patient visit. Please see the separate note by Alan [name] on this date for the remainder of the patient's plan of care.    Carola Rhine, [facility]   **Disclaimer: This note was dictated with voice recognition software. Similar sounding words can inadvertently be transcribed and this note may contain transcription errors which may not have been corrected upon publication of note.**

## 2020-10-12 ENCOUNTER — Institutional Professional Consult (permissible substitution) (INDEPENDENT_AMBULATORY_CARE_PROVIDER_SITE_OTHER): Payer: Medicare Other | Admitting: Thoracic Surgery (Cardiothoracic Vascular Surgery)

## 2020-10-12 ENCOUNTER — Ambulatory Visit
Admission: RE | Admit: 2020-10-12 | Discharge: 2020-10-12 | Disposition: A | Discharge status: Home / Self Care | Payer: Medicare Other | Source: Ambulatory Visit | Attending: Radiation Oncology | Admitting: Radiation Oncology

## 2020-10-12 ENCOUNTER — Encounter (HOSPITAL_COMMUNITY): Payer: Self-pay | Admitting: Hematology

## 2020-10-12 VITALS — BP 91/64 | HR 64 | Resp 20 | Ht 65.0 in | Wt 160.0 lb

## 2020-10-12 DIAGNOSIS — J948 Other specified pleural conditions: Secondary | ICD-10-CM

## 2020-10-12 DIAGNOSIS — C7931 Secondary malignant neoplasm of brain: Secondary | ICD-10-CM

## 2020-10-12 DIAGNOSIS — C3492 Malignant neoplasm of unspecified part of left bronchus or lung: Secondary | ICD-10-CM

## 2020-10-12 MED ORDER — HEPARIN SOD (PORK) LOCK FLUSH 100 UNIT/ML IV SOLN
500.0000 [IU] | Freq: Once | INTRAVENOUS | Status: AC
  Administered 2020-10-12: 500 [IU] via INTRAVENOUS

## 2020-10-12 MED ORDER — SODIUM CHLORIDE 0.9% FLUSH
10.0000 mL | INTRAVENOUS | Status: DC | PRN
  Administered 2020-10-12: 10 mL via INTRAVENOUS

## 2020-10-12 MED ORDER — GADOBENATE DIMEGLUMINE 529 MG/ML IV SOLN
15.0000 mL | Freq: Once | INTRAVENOUS | Status: AC | PRN
  Administered 2020-10-12: 15 mL via INTRAVENOUS

## 2020-10-12 NOTE — Progress Notes (Signed)
PCP is [facility] Referring Provider is Derek Jack, MD  Chief Complaint  Patient presents with   hydropneumothorax    Surgical consult, Chest CT 09/05/20, PET Scan 09/20/20, MR Brain 10/02/20   Alan [name] is sent for consultation regarding a hydropneumothorax.  HPI: Alan [name] is a 70 year old man with a history of hypertension, hyperlipidemia, hypothyroidism, paroxysmal atrial fibrillation, MI, tobacco abuse, and stage IV lung cancer.  He was diagnosed with squamous cell carcinoma of the left upper lobe and September 2021.  He was stage IIIb at time of diagnosis.  He was treated with chemotherapy and radiation.  He then was treated with durvalumab.  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  His treatment was changed to ramucirumab and docetaxel.  He had a PET/CT on 09/20/2020 which showed possible enlargement of a left apical hydropneumothorax.  He had some chest pain on the left side dating back a year ago.  There is been no recent change in that.  There has been no recent change in his respiratory status.  Zubrod Score: At the time of surgery this patient's most appropriate activity status/level should be described as: []     0    Normal activity, no symptoms []     1    Restricted in physical strenuous activity but ambulatory, able to do out light work []     2    Ambulatory and capable of self care, unable to do work activities, up and about >50 % of waking hours                              [x]     3    Only limited self care, in bed greater than 50% of waking hours []     4    Completely disabled, no self care, confined to bed or chair []     5    Moribund  Past Medical History:  Diagnosis Date   Arthritis    Hyperlipemia    Hypertension    Hyperthyroidism    Lung cancer ([place])    Myocardial infarction ([place])    Paroxysmal atrial fibrillation ([place])    Port-A-Cath in place 11/14/2019   Tachycardia     Past Surgical History:  Procedure  Laterality Date   CARDIAC CATHETERIZATION     PORTACATH PLACEMENT Right 11/01/2019   Procedure: INSERTION PORT-A-CATH;  Surgeon: Aviva Signs, MD;  Location: [facility];  Service: General;  Laterality: Right;    Family History  Problem Relation Age of Onset   Hypertension Father    Dementia Father        died age 54   Heart disease Father    Heart failure Other     Social History Social History   Tobacco Use   Smoking status: Former    Years: 45.00    Types: Cigars, Cigarettes    Quit date: 10/19/2014    Years since quitting: 5.9   Smokeless tobacco: Never  Vaping Use   Vaping Use: Never used  Substance Use Topics   Alcohol use: Never   Drug use: No    Current Outpatient Medications  Medication Sig Dispense Refill   acetaminophen (TYLENOL) 500 MG tablet Take 500 mg by mouth every 6 (six) hours as needed for moderate pain or headache.     ALPRAZolam (XANAX) 0.25 MG tablet TAKE 1 TABLET BY MOUTH THREE TIMES DAILY AS NEEDED FOR ANXIETY 30 tablet 0  chlorproMAZINE (THORAZINE) 25 MG tablet Take 1 tablet (25 mg total) by mouth every 6 (six) hours as needed. 30 tablet 0   dexamethasone (DECADRON) 4 MG tablet Take 1 tablet (4 mg total) by mouth every 6 (six) hours. 60 tablet 0   dronabinol (MARINOL) 5 MG capsule Take 1 capsule (5 mg total) by mouth 2 (two) times daily before a meal. 60 capsule 2   Durvalumab (IMFINZI IV) Inject into the vein.     ELIQUIS 5 MG TABS tablet Take 1 tablet by mouth twice daily 60 tablet 5   levothyroxine (SYNTHROID) 100 MCG tablet Take 1 tablet (100 mcg total) by mouth daily before breakfast. 30 tablet 3   lidocaine-prilocaine (EMLA) cream APPLY SMALL AMOUNT TO PORTA-CATH SITE AND COVER WITH PLASTIC WRAP 1 HOUR PRIOR TO CHEMO APPOINTMENTS. 30 g 6   Melatonin 10 MG TABS Take 1 tablet by mouth at bedtime.     metoprolol tartrate (LOPRESSOR) 25 MG tablet Take one tablet(25 mg )  in the AM and take 1/2 Tablet (12.5mg ) in the PM 135 tablet 3   No current  facility-administered medications for this visit.   Facility-Administered Medications Ordered in Other Visits  Medication Dose Route Frequency Provider Last Rate Last Admin   sodium chloride flush (NS) 0.9 % injection 10 mL  10 mL Intravenous PRN Kyung Rudd, MD   10 mL at 10/12/20 1432    No Known Allergies  Review of Systems  Constitutional:  Positive for fatigue and unexpected weight change (Weight loss).  Respiratory:  Positive for cough and shortness of breath (No change from baseline).   Musculoskeletal:  Positive for gait problem.   BP 91/64   Pulse 64   Resp 20   Ht 5\' 5"  (1.651 m)   Wt 160 lb (72.6 kg)   SpO2 97% Comment: RA  BMI 26.63 kg/m  Physical Exam Vitals reviewed.  Constitutional:      General: He is not in acute distress.    Comments: Chronically ill-appearing  HENT:     Head: Normocephalic and atraumatic.     Mouth/Throat:     Comments: Hoarse voice Eyes:     Extraocular Movements: Extraocular movements intact.  Cardiovascular:     Rate and Rhythm: Normal rate and regular rhythm.     Heart sounds: No murmur heard. Pulmonary:     Effort: Pulmonary effort is normal.     Breath sounds: Wheezing (Bilateral) present.  Lymphadenopathy:     Cervical: No cervical adenopathy.  Skin:    General: Skin is warm and dry.  Neurological:     General: No focal deficit present.     Comments: Somnolent    Diagnostic Tests: NUCLEAR MEDICINE PET SKULL BASE TO THIGH   TECHNIQUE: 8.6 mCi F-18 FDG was injected intravenously. Full-ring PET imaging was performed from the skull base to thigh after the radiotracer. CT data was obtained and used for attenuation correction and anatomic localization.   Fasting blood glucose: 114 mg/dl   COMPARISON:  Multiple exams, including CT scan 09/05/2020 and prior PET-CT from 10/14/2019   FINDINGS: Mediastinal blood pool activity: SUV max 2.3   Liver activity: SUV max NA   NECK: There is new unusual high density in the  vocal cords as on image 65 series 3, retrospectively visible on 09/05/2020 but not on 07/20/2020, associated with accentuated metabolic activity. The right focal cord has maximum SUV of 9.6 with the left having a maximum SUV of 3.9. Possibilities might include laryngoplasty/vocal cord  injection or calcification associated with inflammation and resulting hypermetabolic activity-correlate with patient's procedural history and any changes in phonation.   Possible accentuated activity in the right frontal white matter on image 320 of the attenuation corrected series, with hypodensity in this vicinity on the CT data which could possibly represent vasogenic edema. Brain imaging by MRI or CT with and without contrast is recommended to exclude intracranial metastatic disease. No lesion was evident in this region on the prior MRI brain from 10/28/2019.   Incidental CT findings: Bilateral common carotid atherosclerotic calcification.   CHEST: Although there has been substantial reduction in size of the left upper lobe mass compared to the prior PET-CT, there is hypermetabolic residual tumor in the AP window and left hilar region. Specifically, the AP window tumor measures about 3.5 by 2.5 cm on image 96 series 3 and has maximum SUV of 13.6. Previously the focal activity in this vicinity had an SUV of about 14.3.   Multifocal activity along the left hilum is present with a suprahilar lesion with maximum SUV of 13.9 measuring about 1.6 cm in diameter, and nodularity tracking adjacent to the right pulmonary vein in the infrahilar region anteriorly with maximum SUV of 9.8.   Atelectatic component of the left upper lobe with low-grade internal activity, SUV about 3.1.   A peripheral left lower lobe nodule seen on recent chest CT of 09/05/2020 measures about 0.8 cm in diameter on image 108 series 3, and has a maximum SUV of 4.2, compatible with a metastatic lesion.   A paraesophageal lymph  node measuring about 0.6 cm in short axis on image 124 of series 3 has a maximum SUV of 5.6, compatible with malignancy.   Incidental CT findings: Coronary, aortic arch, and branch vessel atherosclerotic vascular disease. Mild cardiomegaly. 15% left pneumothorax primarily seen along the base and the apex, mildly increased compared to 09/05/2020.   ABDOMEN/PELVIS: Small but persistent focus of accentuated metabolic activity in the distal sigmoid colon, less than 1 cm in diameter, maximum SUV 6.8 and previously 5.8, quite likely a small colon polyp.   Incidental CT findings: Aortoiliac atherosclerotic vascular disease. Photopenic right mid kidney cyst.   SKELETON: No significant abnormal hypermetabolic activity in this region.   Incidental CT findings: Bridging spurring of both sacroiliac joints. 0.5 cm sclerotic lesion in the right iliac bone on image 216 series 3 is unchanged from 10/14/2019 and not hypermetabolic. This is likely a benign bone island or similar benign lesion. Lumbar spondylosis and degenerative disc disease.   IMPRESSION: 1. The previous 5-10% left hydropneumothorax has increased to about 15% of left hemithoracic volume today. No findings of tension component. Continued left upper lobe volume loss and post radiation therapy findings. 2. Active hypermetabolic malignancy in the left AP window and left hilum, along with a hypermetabolic 8 mm left lower lobe nodule favoring a metastatic lesion. Small but hypermetabolic lower paraesophageal lymph node. 3. Hypodensity in the right frontal lobe on the CT data, with some heterogeneous/increased activity in this vicinity on the PET data, raising the possibility of intracranial metastatic disease. MRI brain with and without contrast is recommended for further characterization. 4. Unusual appearance of the vocal cords which are no high in density and demonstrate hypermetabolic activity. Query laryngoplasty/vocal cord  injection, versus interval calcification associated with inflammation. Has the patient had any procedure or changes in phonation? 5. Sub-centimeter focal hypermetabolic activity in the distal sigmoid colon, roughly stable from prior, suspicious for a small colonic polyp. 6. Aortic Atherosclerosis (  ICD10-I70.0). Coronary atherosclerosis with mild cardiomegaly.     Electronically Signed   By: Van Clines M.D.   On: 09/21/2020 16:30 I personally reviewed the PET/CT images and compared them to the CT from a couple of weeks prior.  Not sure there is any dramatic increase in size.  Impression: Alan [name] is a 70 year old man with stage IV lung cancer status post chemoradiation now on chemo and immunotherapy.  He is soon to start treatment for a newly discovered brain metastasis.  His CT about a month ago and then a PET/CT a couple of weeks later showed a left apical space partially fluid-filled.  On the PET/CT report it was indicated that this had enlarged from 5 to 10% to 15%.  I do not see any dramatic change in the space.  I do not see any indication for any intervention.  I do not think he would be a candidate for surgery for that.  The only potential operative treatment for that would be to do a thoracotomy and muscle flap to fill the space.  I personally think that would be a mistake in his case.  Primary concern with the space is potential for becoming infected.  If that were to happen he would need to be managed with tube drainage.  I would recommend follow-up based on clinical symptoms.  Continue with treatment for brain metastasis.  Plan: Follow-up with Dr. Haynes Kerns, MD [facility] [phone]

## 2020-10-15 ENCOUNTER — Ambulatory Visit
Admission: RE | Admit: 2020-10-15 | Discharge: 2020-10-15 | Disposition: A | Discharge status: Home / Self Care | Payer: Medicare Other | Source: Ambulatory Visit | Attending: Radiation Oncology | Admitting: Radiation Oncology

## 2020-10-15 ENCOUNTER — Other Ambulatory Visit: Payer: Self-pay

## 2020-10-15 ENCOUNTER — Ambulatory Visit: Payer: Medicare Other

## 2020-10-15 ENCOUNTER — Ambulatory Visit: Payer: Medicare Other | Admitting: Radiation Oncology

## 2020-10-15 ENCOUNTER — Other Ambulatory Visit: Payer: Self-pay | Admitting: Neurosurgery

## 2020-10-15 VITALS — BP 99/58 | HR 51 | Temp 97.9°F | Resp 18

## 2020-10-15 DIAGNOSIS — Z510 Encounter for antineoplastic radiation therapy: Secondary | ICD-10-CM | POA: Diagnosis not present

## 2020-10-15 DIAGNOSIS — C7931 Secondary malignant neoplasm of brain: Secondary | ICD-10-CM | POA: Insufficient documentation

## 2020-10-15 DIAGNOSIS — C3412 Malignant neoplasm of upper lobe, left bronchus or lung: Secondary | ICD-10-CM | POA: Insufficient documentation

## 2020-10-15 MED ORDER — HEPARIN SOD (PORK) LOCK FLUSH 100 UNIT/ML IV SOLN
500.0000 [IU] | Freq: Once | INTRAVENOUS | Status: AC
  Administered 2020-10-15: 500 [IU] via INTRAVENOUS

## 2020-10-15 MED ORDER — SODIUM CHLORIDE 0.9% FLUSH
10.0000 mL | Freq: Once | INTRAVENOUS | Status: AC
  Administered 2020-10-15: 10 mL via INTRAVENOUS

## 2020-10-15 NOTE — Progress Notes (Signed)
I saw the patient to consent him for simulation today.  I reviewed his MRI scan and plans to proceed with 3 fractions of stereotactic radiosurgery.

## 2020-10-15 NOTE — Progress Notes (Signed)
Has armband been applied?  Yes  Does patient have an allergy to IV contrast dye?: No   Has patient ever received premedication for IV contrast dye?:  No  Does patient take metformin?: No  If patient does take metformin when was the last dose: No  Date of lab work: 10/03/2020 BUN: 16 CR: 1.28 eGfr: 60  IV site: Right Chest Port  Has IV site been added to flowsheet?  Yes   BP (!) 99/58   Pulse (!) 51   Temp 97.9 F (36.6 C)   Resp 18   SpO2 100%    Emmersen Garraway M. Leonie Green, BSN

## 2020-10-16 ENCOUNTER — Other Ambulatory Visit: Payer: Self-pay | Admitting: Neurosurgery

## 2020-10-17 DIAGNOSIS — C7931 Secondary malignant neoplasm of brain: Secondary | ICD-10-CM | POA: Diagnosis not present

## 2020-10-18 ENCOUNTER — Ambulatory Visit
Admission: RE | Admit: 2020-10-18 | Discharge: 2020-10-18 | Disposition: A | Discharge status: Home / Self Care | Payer: Medicare Other | Source: Ambulatory Visit | Attending: Radiation Oncology | Admitting: Radiation Oncology

## 2020-10-18 ENCOUNTER — Other Ambulatory Visit: Payer: Self-pay

## 2020-10-18 DIAGNOSIS — Z510 Encounter for antineoplastic radiation therapy: Secondary | ICD-10-CM | POA: Insufficient documentation

## 2020-10-18 DIAGNOSIS — C7931 Secondary malignant neoplasm of brain: Secondary | ICD-10-CM | POA: Insufficient documentation

## 2020-10-18 DIAGNOSIS — C3412 Malignant neoplasm of upper lobe, left bronchus or lung: Secondary | ICD-10-CM | POA: Diagnosis not present

## 2020-10-18 NOTE — Op Note (Signed)
Name: Alan [name]    MRN: [id number]   Date: 10/18/2020    DOB: Jan 18, 1951   STEREOTACTIC RADIOSURGERY OPERATIVE NOTE  PRE-OPERATIVE DIAGNOSIS:  Metastatic lung CA  POST-OPERATIVE DIAGNOSIS:  Same  PROCEDURE:  Stereotactic Radiosurgery  SURGEON:  Consuella Lose, MD  RADIATION ONCOLOGIST: Dr. Kyung Rudd, MD  TECHNIQUE:  The patient underwent a radiation treatment planning session in the radiation oncology simulation suite under the care of the radiation oncology physician and physicist.  I participated closely in the radiation treatment planning afterwards. The patient underwent planning CT which was fused to 3T high resolution MRI with 1 mm axial slices.  These images were fused on the planning system.  We contoured the gross target volumes and subsequently expanded this to yield the Planning Target Volume. I actively participated in the planning process.  I helped to define and review the target contours and also the contours of the optic pathway, eyes, brainstem and selected nearby organs at risk.  All the dose constraints for critical structures were reviewed and compared to AAPM Task Group 101.  The prescription dose conformity was reviewed.  I approved the plan electronically.    Accordingly, Alan [name]  was brought to the TrueBeam stereotactic radiation treatment linac and placed in the custom immobilization mask.  The patient was aligned according to the IR fiducial markers with BrainLab Exactrac, then orthogonal x-rays were used in ExacTrac with the 6DOF robotic table and the shifts were made to align the patient  Alan [name] received the 1st of 3 planned fractions of stereotactic radiosurgery to a prescription dose of 9Gy uneventfully to the right frontal tumor.  The detailed description of the procedure is recorded in the radiation oncology procedure note.  I was present for the duration of the procedure.  DISPOSITION:   Following delivery, the patient was transported to  nursing in stable condition and monitored for possible acute effects to be discharged to home in stable condition with follow-up in one month.  Consuella Lose, MD [facility]

## 2020-10-18 NOTE — Progress Notes (Signed)
Alan [name] rested with [name] for 15 minutes following his SRS treatment.  Patient denies headache, dizziness, nausea, diplopia or ringing in the ears. Denies fatigue. Patient without complaints. Understands to avoid strenuous activity for the next 24 hours and call [phone] with needs.   Pt ambulated out of the clinic without difficulty, wife present.  BP 115/81 (BP Location: Left Arm, Patient Position: Sitting, Cuff Size: Normal)   Pulse (!) 54   Temp 97.7 F (36.5 C)   Resp 20   SpO2 98%    Alan [name], BSN

## 2020-10-23 ENCOUNTER — Ambulatory Visit (HOSPITAL_COMMUNITY): Payer: Medicare Other

## 2020-10-23 ENCOUNTER — Ambulatory Visit
Admission: RE | Admit: 2020-10-23 | Discharge: 2020-10-23 | Disposition: A | Discharge status: Home / Self Care | Payer: Medicare Other | Source: Ambulatory Visit | Attending: Radiation Oncology | Admitting: Radiation Oncology

## 2020-10-23 ENCOUNTER — Other Ambulatory Visit: Payer: Self-pay

## 2020-10-23 ENCOUNTER — Other Ambulatory Visit (HOSPITAL_COMMUNITY): Payer: Medicare Other

## 2020-10-23 NOTE — Progress Notes (Signed)
Alan [name] rested with [name] for 15 minutes following his SRS treatment.  Patient denies headache, dizziness, nausea, diplopia or ringing in the ears. Denies fatigue. Patient without complaints. Understands to avoid strenuous activity for the next 24 hours and call [phone] with needs.   Pt was wheeled to waiting room to meet wife.   BP 97/63   Pulse 76   Temp 98 F (36.7 C)   Resp 16   SpO2 99%    [name], BSN

## 2020-10-23 NOTE — Pre-Procedure Instructions (Signed)
Surgical Instructions    Your procedure is scheduled on Friday 10/26/20.   Report to [facility] Main Entrance "A" at 05:30 A.M., then check in with the Admitting office.  Call this number if you have problems the morning of surgery:  [phone]   If you have any questions prior to your surgery date call [phone]: Open Monday-Friday 8am-4pm    Remember:  Do not eat after midnight the night before your surgery  You may drink clear liquids until 04:30 A.M. the morning of your surgery.   Clear liquids allowed are: Water, Non-Citrus Juices (without pulp), Carbonated Beverages, Clear Tea, Black Coffee ONLY (NO MILK, CREAM OR POWDERED CREAMER of any kind), and Gatorade    Take these medicines the morning of surgery with A SIP OF WATER   dexamethasone (DECADRON)  levothyroxine (SYNTHROID)  metoprolol tartrate (LOPRESSOR)     Take these medicines if needed:   acetaminophen (TYLENOL)  `ALPRAZolam (XANAX)   chlorproMAZINE (THORAZINE)   Please follow your surgeon's instructions regarding Eliquis. If you have not received instructions then you need to contact your surgeon's office for instructions.   As of today, STOP taking any Aspirin (unless otherwise instructed by your surgeon) Aleve, Naproxen, Ibuprofen, Motrin, Advil, Goody's, BC's, all herbal medications, fish oil, and all vitamins.          Do not wear jewelry or makeup Do not wear lotions, powders, perfumes/colognes, or deodorant. Do not shave 48 hours prior to surgery.  Men may shave face and neck. Do not bring valuables to the hospital. DO Not wear nail polish, gel polish, artificial nails, or any other type of covering on natural nails including finger and toenails. If patients have artificial nails, gel coating, etc. that need to be removed by a nail salon please have this removed prior to surgery or surgery may need to be canceled/delayed if the surgeon/ anesthesia feels like the patient is unable to be adequately  monitored.             [facility] is not responsible for any belongings or valuables.  Do NOT Smoke (Tobacco/Vaping)  24 hours prior to your procedure If you use a CPAP at night, you may bring all equipment for your overnight stay.   Contacts, glasses, dentures or bridgework may not be worn into surgery, please bring cases for these belongings   For patients admitted to the hospital, discharge time will be determined by your treatment team.   Patients discharged the day of surgery will not be allowed to drive home, and someone needs to stay with them for 24 hours.  ONLY 1 SUPPORT PERSON MAY BE PRESENT WHILE YOU ARE IN SURGERY. IF YOU ARE TO BE ADMITTED ONCE YOU ARE IN YOUR ROOM YOU WILL BE ALLOWED TWO (2) VISITORS.  Minor children may have two parents present. Special consideration for safety and communication needs will be reviewed on a case by case basis.  Special instructions:    Oral Hygiene is also important to reduce your risk of infection.  Remember - BRUSH YOUR TEETH THE MORNING OF SURGERY WITH YOUR REGULAR TOOTHPASTE   [place]- Preparing For Surgery  Before surgery, you can play an important role. Because skin is not sterile, your skin needs to be as free of germs as possible. You can reduce the number of germs on your skin by washing with CHG (chlorahexidine gluconate) Soap before surgery.  CHG is an antiseptic cleaner which kills germs and bonds with the skin to continue killing  germs even after washing.     Please do not use if you have an allergy to CHG or antibacterial soaps. If your skin becomes reddened/irritated stop using the CHG.  Do not shave (including legs and underarms) for at least 48 hours prior to first CHG shower. It is OK to shave your face.  Please follow these instructions carefully.     Shower the NIGHT BEFORE SURGERY and the MORNING OF SURGERY with CHG Soap.   If you chose to wash your hair, wash your hair first as usual with your normal shampoo.  After you shampoo, rinse your hair and body thoroughly to remove the shampoo.  Then [employer] and genitals (private parts) with your normal soap and rinse thoroughly to remove soap.  After that Use CHG Soap as you would any other liquid soap. You can apply CHG directly to the skin and wash gently with a scrungie or a clean washcloth.   Apply the CHG Soap to your body ONLY FROM THE NECK DOWN.  Do not use on open wounds or open sores. Avoid contact with your eyes, ears, mouth and genitals (private parts). Wash Face and genitals (private parts)  with your normal soap.   Wash thoroughly, paying special attention to the area where your surgery will be performed.  Thoroughly rinse your body with warm water from the neck down.  DO NOT shower/wash with your normal soap after using and rinsing off the CHG Soap.  Pat yourself dry with a CLEAN TOWEL.  Wear CLEAN PAJAMAS to bed the night before surgery  Place CLEAN SHEETS on your bed the night before your surgery  DO NOT SLEEP WITH PETS.   Day of Surgery:  Take a shower with CHG soap. Wear Clean/Comfortable clothing the morning of surgery Do not apply any deodorants/lotions.   Remember to brush your teeth WITH YOUR REGULAR TOOTHPASTE.   Please read over the following fact sheets that you were given.

## 2020-10-24 ENCOUNTER — Encounter (HOSPITAL_COMMUNITY)
Admission: RE | Admit: 2020-10-24 | Discharge: 2020-10-24 | Disposition: A | Discharge status: Home / Self Care | Payer: Medicare Other | Source: Ambulatory Visit | Attending: Neurosurgery | Admitting: Neurosurgery

## 2020-10-24 ENCOUNTER — Ambulatory Visit (HOSPITAL_COMMUNITY): Payer: Medicare Other | Admitting: Hematology

## 2020-10-24 ENCOUNTER — Ambulatory Visit (HOSPITAL_COMMUNITY): Payer: Medicare Other

## 2020-10-24 ENCOUNTER — Other Ambulatory Visit (HOSPITAL_COMMUNITY): Payer: Medicare Other

## 2020-10-24 ENCOUNTER — Encounter (HOSPITAL_COMMUNITY): Payer: Self-pay

## 2020-10-24 DIAGNOSIS — Z20822 Contact with and (suspected) exposure to covid-19: Secondary | ICD-10-CM | POA: Insufficient documentation

## 2020-10-24 DIAGNOSIS — Z01812 Encounter for preprocedural laboratory examination: Secondary | ICD-10-CM | POA: Insufficient documentation

## 2020-10-24 HISTORY — DX: Cardiac arrhythmia, unspecified: I49.9

## 2020-10-24 HISTORY — DX: Anxiety disorder, unspecified: F41.9

## 2020-10-24 LAB — TYPE AND SCREEN
ABO/RH(D): O NEG
Antibody Screen: NEGATIVE

## 2020-10-24 LAB — SARS CORONAVIRUS 2 (TAT 6-24 HRS): SARS Coronavirus 2: NEGATIVE

## 2020-10-24 NOTE — Progress Notes (Signed)
PCP - [facility]  Cardiologist - Denies  EP-Denies  Endocrine-Denies  Pulm-Denies  Chest x-ray - 11/01/19 (E)  EKG - 05/09/20 (E)  Stress Test - Denies  ECHO - 11/11/19 (E)  Cardiac Cath - 06/22/06 (E)  AICD-na PM-na LOOP-na  Dialysis-Denies  Sleep Study - Denies CPAP - Denies  LABS- 10/03/20:CBC, CMP 10/24/20: T/S, COVID  ASA- Denies Eliquis- LD- 9/4  ERAS- Yes- no drink  HA1C- Denies  Anesthesia- No  Pt denies having chest pain, sob, or fever at this time. All instructions explained to the pt, with a verbal understanding of the material. Pt agrees to go over the instructions while at home for a better understanding. Pt also instructed to wear a mask and social distance after being tested for COVID-19. The opportunity to ask questions was provided.    Coronavirus Screening  Have you experienced the following symptoms:  Cough yes/no: No Fever (>100.86F)  yes/no: No Runny nose yes/no: No Sore throat yes/no: No Difficulty breathing/shortness of breath  yes/no: No  Have you or a family member traveled in the last 14 days and where? yes/no: No   If the patient indicates "YES" to the above questions, their PAT will be rescheduled to limit the exposure to others and, the surgeon will be notified. THE PATIENT WILL NEED TO BE ASYMPTOMATIC FOR 14 DAYS.   If the patient is not experiencing any of these symptoms, the PAT nurse will instruct them to NOT bring anyone with them to their appointment since they may have these symptoms or traveled as well.   Please remind your patients and families that hospital visitation restrictions are in effect and the importance of the restrictions.

## 2020-10-25 ENCOUNTER — Other Ambulatory Visit: Payer: Self-pay

## 2020-10-25 ENCOUNTER — Ambulatory Visit
Admission: RE | Admit: 2020-10-25 | Discharge: 2020-10-25 | Disposition: A | Discharge status: Home / Self Care | Payer: Medicare Other | Source: Ambulatory Visit | Attending: Radiation Oncology | Admitting: Radiation Oncology

## 2020-10-25 ENCOUNTER — Encounter: Payer: Self-pay | Admitting: Radiation Oncology

## 2020-10-25 NOTE — Progress Notes (Signed)
Mr. Dastrup rested with [name] for 15 minutes following his SRS treatment.  Patient denies headache, dizziness, nausea, diplopia or ringing in the ears. Denies fatigue. Patient without complaints. Understands to avoid strenuous activity for the next 24 hours and call [phone] with needs.   Patient is going for his surgery tomorrow.  Ambulated out of the clinic with his wife without difficulty.     BP (!) 102/54   Pulse 67   Temp 97.8 F (36.6 C)   Resp 20   SpO2 100%      Dontavia Brand M. Leonie Green, BSN

## 2020-10-26 ENCOUNTER — Encounter (HOSPITAL_COMMUNITY): Payer: Self-pay | Admitting: Neurosurgery

## 2020-10-26 ENCOUNTER — Inpatient Hospital Stay (HOSPITAL_COMMUNITY): Payer: Medicare Other

## 2020-10-26 ENCOUNTER — Inpatient Hospital Stay (HOSPITAL_COMMUNITY)
Admission: RE | Admit: 2020-10-26 | Discharge: 2020-10-30 | DRG: 026 | Disposition: A | Discharge status: Home / Self Care | Payer: Medicare Other | Attending: Neurosurgery | Admitting: Neurosurgery

## 2020-10-26 ENCOUNTER — Encounter (HOSPITAL_COMMUNITY)
Admission: RE | Disposition: A | Discharge status: Home / Self Care | Payer: Self-pay | Source: Home / Self Care | Attending: Neurosurgery

## 2020-10-26 DIAGNOSIS — Z8249 Family history of ischemic heart disease and other diseases of the circulatory system: Secondary | ICD-10-CM

## 2020-10-26 DIAGNOSIS — I482 Chronic atrial fibrillation, unspecified: Secondary | ICD-10-CM | POA: Diagnosis not present

## 2020-10-26 DIAGNOSIS — C7931 Secondary malignant neoplasm of brain: Principal | ICD-10-CM | POA: Diagnosis present

## 2020-10-26 DIAGNOSIS — I4819 Other persistent atrial fibrillation: Secondary | ICD-10-CM | POA: Diagnosis not present

## 2020-10-26 DIAGNOSIS — I959 Hypotension, unspecified: Secondary | ICD-10-CM | POA: Diagnosis not present

## 2020-10-26 DIAGNOSIS — I2510 Atherosclerotic heart disease of native coronary artery without angina pectoris: Secondary | ICD-10-CM | POA: Diagnosis present

## 2020-10-26 DIAGNOSIS — Z79890 Hormone replacement therapy (postmenopausal): Secondary | ICD-10-CM | POA: Diagnosis not present

## 2020-10-26 DIAGNOSIS — I4821 Permanent atrial fibrillation: Secondary | ICD-10-CM | POA: Diagnosis present

## 2020-10-26 DIAGNOSIS — Z87891 Personal history of nicotine dependence: Secondary | ICD-10-CM

## 2020-10-26 DIAGNOSIS — Z20822 Contact with and (suspected) exposure to covid-19: Secondary | ICD-10-CM | POA: Diagnosis present

## 2020-10-26 DIAGNOSIS — C719 Malignant neoplasm of brain, unspecified: Secondary | ICD-10-CM | POA: Diagnosis present

## 2020-10-26 DIAGNOSIS — Z79899 Other long term (current) drug therapy: Secondary | ICD-10-CM | POA: Diagnosis not present

## 2020-10-26 DIAGNOSIS — E785 Hyperlipidemia, unspecified: Secondary | ICD-10-CM | POA: Diagnosis present

## 2020-10-26 DIAGNOSIS — I252 Old myocardial infarction: Secondary | ICD-10-CM | POA: Diagnosis not present

## 2020-10-26 DIAGNOSIS — E039 Hypothyroidism, unspecified: Secondary | ICD-10-CM | POA: Diagnosis present

## 2020-10-26 DIAGNOSIS — F419 Anxiety disorder, unspecified: Secondary | ICD-10-CM | POA: Diagnosis present

## 2020-10-26 DIAGNOSIS — I10 Essential (primary) hypertension: Secondary | ICD-10-CM | POA: Diagnosis present

## 2020-10-26 DIAGNOSIS — I4510 Unspecified right bundle-branch block: Secondary | ICD-10-CM | POA: Diagnosis present

## 2020-10-26 DIAGNOSIS — Z7901 Long term (current) use of anticoagulants: Secondary | ICD-10-CM

## 2020-10-26 DIAGNOSIS — Z85118 Personal history of other malignant neoplasm of bronchus and lung: Secondary | ICD-10-CM | POA: Diagnosis not present

## 2020-10-26 HISTORY — PX: CRANIOTOMY: SHX93

## 2020-10-26 HISTORY — PX: APPLICATION OF CRANIAL NAVIGATION: SHX6578

## 2020-10-26 LAB — ABO/RH: ABO/RH(D): O NEG

## 2020-10-26 LAB — MRSA NEXT GEN BY PCR, NASAL: MRSA by PCR Next Gen: NOT DETECTED

## 2020-10-26 SURGERY — CRANIOTOMY TUMOR EXCISION
Anesthesia: General | Laterality: Right

## 2020-10-26 MED ORDER — ORAL CARE MOUTH RINSE: 15.0000 mL | Freq: Once | OROMUCOSAL | Status: AC

## 2020-10-26 MED ORDER — DEXAMETHASONE SODIUM PHOSPHATE 4 MG/ML IJ SOLN
4.0000 mg | Freq: Four times a day (QID) | INTRAMUSCULAR | Status: AC
  Administered 2020-10-27 – 2020-10-28 (×4): 4 mg via INTRAVENOUS
  Filled 2020-10-26 (×4): qty 1

## 2020-10-26 MED ORDER — LABETALOL HCL 5 MG/ML IV SOLN: 10.0000 mg | INTRAVENOUS | Status: DC | PRN

## 2020-10-26 MED ORDER — CEFAZOLIN SODIUM-DEXTROSE 1-4 GM/50ML-% IV SOLN
1.0000 g | Freq: Three times a day (TID) | INTRAVENOUS | Status: AC
  Administered 2020-10-26 – 2020-10-27 (×2): 1 g via INTRAVENOUS
  Filled 2020-10-26 (×2): qty 50

## 2020-10-26 MED ORDER — THROMBIN 5000 UNITS EX SOLR
OROMUCOSAL | Status: DC | PRN
  Administered 2020-10-26: 5 mL via TOPICAL

## 2020-10-26 MED ORDER — ROCURONIUM BROMIDE 10 MG/ML (PF) SYRINGE
PREFILLED_SYRINGE | INTRAVENOUS | Status: DC | PRN
Start: 2020-10-26 — End: 2020-10-26
  Administered 2020-10-26: 20 mg via INTRAVENOUS
  Administered 2020-10-26: 60 mg via INTRAVENOUS
  Administered 2020-10-26: 20 mg via INTRAVENOUS

## 2020-10-26 MED ORDER — ALPRAZOLAM 0.25 MG PO TABS
0.2500 mg | ORAL_TABLET | Freq: Three times a day (TID) | ORAL | Status: DC | PRN
  Administered 2020-10-29: 0.25 mg via ORAL
  Filled 2020-10-26: qty 1

## 2020-10-26 MED ORDER — PROPOFOL 10 MG/ML IV BOLUS
INTRAVENOUS | Status: DC | PRN
  Administered 2020-10-26: 100 mg via INTRAVENOUS
  Administered 2020-10-26: 50 mg via INTRAVENOUS

## 2020-10-26 MED ORDER — PANTOPRAZOLE SODIUM 40 MG IV SOLR
40.0000 mg | Freq: Every day | INTRAVENOUS | Status: DC
  Administered 2020-10-26 – 2020-10-29 (×4): 40 mg via INTRAVENOUS
  Filled 2020-10-26 (×4): qty 40

## 2020-10-26 MED ORDER — LEVOTHYROXINE SODIUM 100 MCG PO TABS
100.0000 ug | ORAL_TABLET | Freq: Every day | ORAL | Status: DC
  Administered 2020-10-27 – 2020-10-30 (×4): 100 ug via ORAL
  Filled 2020-10-26 (×4): qty 1

## 2020-10-26 MED ORDER — LIDOCAINE 2% (20 MG/ML) 5 ML SYRINGE
INTRAMUSCULAR | Status: DC | PRN
  Administered 2020-10-26: 60 mg via INTRAVENOUS

## 2020-10-26 MED ORDER — FENTANYL CITRATE (PF) 250 MCG/5ML IJ SOLN
INTRAMUSCULAR | Status: AC
  Filled 2020-10-26: qty 5

## 2020-10-26 MED ORDER — THROMBIN 5000 UNITS EX SOLR
CUTANEOUS | Status: AC
  Filled 2020-10-26: qty 5000

## 2020-10-26 MED ORDER — METOPROLOL TARTRATE 12.5 MG HALF TABLET
12.5000 mg | ORAL_TABLET | Freq: Every day | ORAL | Status: DC
  Administered 2020-10-26: 12.5 mg via ORAL
  Filled 2020-10-26: qty 1

## 2020-10-26 MED ORDER — GADOBUTROL 1 MMOL/ML IV SOLN
6.8000 mL | Freq: Once | INTRAVENOUS | Status: AC | PRN
  Administered 2020-10-26: 6.8 mL via INTRAVENOUS

## 2020-10-26 MED ORDER — ONDANSETRON HCL 4 MG/2ML IJ SOLN
INTRAMUSCULAR | Status: DC | PRN
  Administered 2020-10-26: 4 mg via INTRAVENOUS

## 2020-10-26 MED ORDER — FENTANYL CITRATE (PF) 100 MCG/2ML IJ SOLN: 25.0000 ug | INTRAMUSCULAR | Status: DC | PRN

## 2020-10-26 MED ORDER — PROMETHAZINE HCL 12.5 MG PO TABS
12.5000 mg | ORAL_TABLET | ORAL | Status: DC | PRN
  Filled 2020-10-26: qty 2

## 2020-10-26 MED ORDER — ONDANSETRON HCL 4 MG/2ML IJ SOLN: 4.0000 mg | INTRAMUSCULAR | Status: DC | PRN

## 2020-10-26 MED ORDER — DILTIAZEM HCL-DEXTROSE 125-5 MG/125ML-% IV SOLN (PREMIX)
5.0000 mg/h | INTRAVENOUS | Status: DC
  Administered 2020-10-26: 5 mg/h via INTRAVENOUS
  Filled 2020-10-26 (×2): qty 125

## 2020-10-26 MED ORDER — CHLORHEXIDINE GLUCONATE CLOTH 2 % EX PADS
6.0000 | MEDICATED_PAD | Freq: Every day | CUTANEOUS | Status: DC
  Administered 2020-10-27 – 2020-10-29 (×3): 6 via TOPICAL

## 2020-10-26 MED ORDER — ESMOLOL HCL 100 MG/10ML IV SOLN
INTRAVENOUS | Status: DC | PRN
  Administered 2020-10-26: 20 mg via INTRAVENOUS
  Administered 2020-10-26: 30 mg via INTRAVENOUS
  Administered 2020-10-26 (×3): 20 mg via INTRAVENOUS
  Administered 2020-10-26: 30 mg via INTRAVENOUS
  Administered 2020-10-26 (×3): 20 mg via INTRAVENOUS

## 2020-10-26 MED ORDER — LIDOCAINE-EPINEPHRINE 1 %-1:100000 IJ SOLN
INTRAMUSCULAR | Status: DC | PRN
  Administered 2020-10-26: 3.5 mL

## 2020-10-26 MED ORDER — LIDOCAINE-EPINEPHRINE 1 %-1:100000 IJ SOLN
INTRAMUSCULAR | Status: AC
  Filled 2020-10-26: qty 1

## 2020-10-26 MED ORDER — ACETAMINOPHEN 10 MG/ML IV SOLN: 1000.0000 mg | Freq: Once | INTRAVENOUS | Status: DC | PRN

## 2020-10-26 MED ORDER — VASOPRESSIN 20 UNIT/ML IV SOLN
INTRAVENOUS | Status: AC
  Filled 2020-10-26: qty 1

## 2020-10-26 MED ORDER — AMISULPRIDE (ANTIEMETIC) 5 MG/2ML IV SOLN: 10.0000 mg | Freq: Once | INTRAVENOUS | Status: DC | PRN

## 2020-10-26 MED ORDER — CHLORHEXIDINE GLUCONATE CLOTH 2 % EX PADS: 6.0000 | MEDICATED_PAD | Freq: Once | CUTANEOUS | Status: DC

## 2020-10-26 MED ORDER — VASOPRESSIN 20 UNIT/ML IV SOLN
INTRAVENOUS | Status: DC | PRN
  Administered 2020-10-26 (×9): 1 [IU] via INTRAVENOUS

## 2020-10-26 MED ORDER — LEVETIRACETAM IN NACL 500 MG/100ML IV SOLN
500.0000 mg | Freq: Two times a day (BID) | INTRAVENOUS | Status: DC
  Administered 2020-10-26 – 2020-10-30 (×8): 500 mg via INTRAVENOUS
  Filled 2020-10-26 (×8): qty 100

## 2020-10-26 MED ORDER — PHENYLEPHRINE HCL-NACL 20-0.9 MG/250ML-% IV SOLN
INTRAVENOUS | Status: DC | PRN
  Administered 2020-10-26: 50 ug/min via INTRAVENOUS

## 2020-10-26 MED ORDER — FENTANYL CITRATE (PF) 100 MCG/2ML IJ SOLN
INTRAMUSCULAR | Status: DC | PRN
  Administered 2020-10-26: 50 ug via INTRAVENOUS
  Administered 2020-10-26 (×2): 100 ug via INTRAVENOUS

## 2020-10-26 MED ORDER — BUPIVACAINE HCL (PF) 0.5 % IJ SOLN
INTRAMUSCULAR | Status: DC | PRN
  Administered 2020-10-26: 3.5 mL

## 2020-10-26 MED ORDER — LIDOCAINE 2% (20 MG/ML) 5 ML SYRINGE
INTRAMUSCULAR | Status: AC
  Filled 2020-10-26: qty 5

## 2020-10-26 MED ORDER — HYDROCODONE-ACETAMINOPHEN 5-325 MG PO TABS
1.0000 | ORAL_TABLET | ORAL | Status: DC | PRN
  Administered 2020-10-26 – 2020-10-28 (×3): 1 via ORAL
  Filled 2020-10-26 (×4): qty 1

## 2020-10-26 MED ORDER — DRONABINOL 5 MG PO CAPS: 5.0000 mg | ORAL_CAPSULE | Freq: Two times a day (BID) | ORAL | Status: DC

## 2020-10-26 MED ORDER — SUGAMMADEX SODIUM 200 MG/2ML IV SOLN
INTRAVENOUS | Status: DC | PRN
  Administered 2020-10-26: 200 mg via INTRAVENOUS

## 2020-10-26 MED ORDER — CHLORHEXIDINE GLUCONATE 0.12 % MT SOLN
15.0000 mL | Freq: Once | OROMUCOSAL | Status: AC
  Administered 2020-10-26: 15 mL via OROMUCOSAL
  Filled 2020-10-26: qty 15

## 2020-10-26 MED ORDER — HEMOSTATIC AGENTS (NO CHARGE) OPTIME
TOPICAL | Status: DC | PRN
  Administered 2020-10-26: 1 via TOPICAL

## 2020-10-26 MED ORDER — THROMBIN 20000 UNITS EX SOLR
CUTANEOUS | Status: AC
  Filled 2020-10-26: qty 20000

## 2020-10-26 MED ORDER — MELATONIN 5 MG PO TABS: 10.0000 mg | ORAL_TABLET | Freq: Every evening | ORAL | Status: DC | PRN

## 2020-10-26 MED ORDER — SODIUM CHLORIDE 0.9 % IV SOLN
0.0500 ug/kg/min | INTRAVENOUS | Status: AC
  Administered 2020-10-26: .15 ug/kg/min via INTRAVENOUS
  Administered 2020-10-26: .1 ug/kg/min via INTRAVENOUS
  Filled 2020-10-26: qty 5000

## 2020-10-26 MED ORDER — 0.9 % SODIUM CHLORIDE (POUR BTL) OPTIME
TOPICAL | Status: DC | PRN
  Administered 2020-10-26: 2000 mL

## 2020-10-26 MED ORDER — BACITRACIN ZINC 500 UNIT/GM EX OINT
TOPICAL_OINTMENT | CUTANEOUS | Status: AC
  Filled 2020-10-26: qty 28.35

## 2020-10-26 MED ORDER — ACETAMINOPHEN 650 MG RE SUPP: 650.0000 mg | RECTAL | Status: DC | PRN

## 2020-10-26 MED ORDER — DEXAMETHASONE SODIUM PHOSPHATE 10 MG/ML IJ SOLN
6.0000 mg | Freq: Four times a day (QID) | INTRAMUSCULAR | Status: AC
  Administered 2020-10-26 – 2020-10-27 (×4): 6 mg via INTRAVENOUS
  Filled 2020-10-26 (×4): qty 1

## 2020-10-26 MED ORDER — DEXAMETHASONE SODIUM PHOSPHATE 10 MG/ML IJ SOLN
INTRAMUSCULAR | Status: DC | PRN
  Administered 2020-10-26: 10 mg via INTRAVENOUS

## 2020-10-26 MED ORDER — METOPROLOL TARTRATE 25 MG PO TABS
25.0000 mg | ORAL_TABLET | Freq: Every morning | ORAL | Status: DC
  Filled 2020-10-26: qty 1

## 2020-10-26 MED ORDER — SODIUM CHLORIDE 0.9 % IV SOLN: INTRAVENOUS | Status: DC

## 2020-10-26 MED ORDER — CEFAZOLIN SODIUM-DEXTROSE 2-4 GM/100ML-% IV SOLN
2.0000 g | INTRAVENOUS | Status: AC
  Administered 2020-10-26: 2 g via INTRAVENOUS
  Filled 2020-10-26: qty 100

## 2020-10-26 MED ORDER — LACTATED RINGERS IV SOLN: INTRAVENOUS | Status: DC | PRN

## 2020-10-26 MED ORDER — ROCURONIUM BROMIDE 10 MG/ML (PF) SYRINGE
PREFILLED_SYRINGE | INTRAVENOUS | Status: AC
  Filled 2020-10-26: qty 10

## 2020-10-26 MED ORDER — LEVETIRACETAM IN NACL 1000 MG/100ML IV SOLN
1000.0000 mg | INTRAVENOUS | Status: AC
  Administered 2020-10-26: 1000 mg via INTRAVENOUS
  Filled 2020-10-26: qty 100

## 2020-10-26 MED ORDER — ACETAMINOPHEN 325 MG PO TABS
650.0000 mg | ORAL_TABLET | ORAL | Status: DC | PRN
  Administered 2020-10-26 – 2020-10-29 (×8): 650 mg via ORAL
  Filled 2020-10-26 (×8): qty 2

## 2020-10-26 MED ORDER — PHENYLEPHRINE 40 MCG/ML (10ML) SYRINGE FOR IV PUSH (FOR BLOOD PRESSURE SUPPORT)
PREFILLED_SYRINGE | INTRAVENOUS | Status: DC | PRN
  Administered 2020-10-26: 80 ug via INTRAVENOUS
  Administered 2020-10-26: 160 ug via INTRAVENOUS
  Administered 2020-10-26 (×3): 80 ug via INTRAVENOUS

## 2020-10-26 MED ORDER — PHENYLEPHRINE 40 MCG/ML (10ML) SYRINGE FOR IV PUSH (FOR BLOOD PRESSURE SUPPORT)
PREFILLED_SYRINGE | INTRAVENOUS | Status: AC
  Filled 2020-10-26: qty 10

## 2020-10-26 MED ORDER — LACTATED RINGERS IV SOLN: INTRAVENOUS | Status: DC

## 2020-10-26 MED ORDER — BACITRACIN ZINC 500 UNIT/GM EX OINT
TOPICAL_OINTMENT | CUTANEOUS | Status: DC | PRN
  Administered 2020-10-26: 1 via TOPICAL

## 2020-10-26 MED ORDER — ONDANSETRON HCL 4 MG PO TABS: 4.0000 mg | ORAL_TABLET | ORAL | Status: DC | PRN

## 2020-10-26 MED ORDER — LIDOCAINE VISCOUS HCL 2 % MT SOLN
15.0000 mL | OROMUCOSAL | Status: DC | PRN
  Administered 2020-10-26 – 2020-10-29 (×7): 15 mL via OROMUCOSAL
  Filled 2020-10-26 (×11): qty 15

## 2020-10-26 MED ORDER — BISACODYL 10 MG RE SUPP: 10.0000 mg | Freq: Every day | RECTAL | Status: DC | PRN

## 2020-10-26 MED ORDER — THROMBIN 20000 UNITS EX SOLR
CUTANEOUS | Status: DC | PRN
  Administered 2020-10-26: 20 mL via TOPICAL

## 2020-10-26 MED ORDER — MORPHINE SULFATE (PF) 2 MG/ML IV SOLN
1.0000 mg | INTRAVENOUS | Status: DC | PRN
  Administered 2020-10-27: 2 mg via INTRAVENOUS
  Filled 2020-10-26: qty 1

## 2020-10-26 MED ORDER — ESMOLOL HCL 100 MG/10ML IV SOLN
INTRAVENOUS | Status: AC
  Filled 2020-10-26: qty 10

## 2020-10-26 MED ORDER — PROPOFOL 10 MG/ML IV BOLUS
INTRAVENOUS | Status: AC
  Filled 2020-10-26: qty 20

## 2020-10-26 MED ORDER — DEXAMETHASONE SODIUM PHOSPHATE 4 MG/ML IJ SOLN
4.0000 mg | Freq: Three times a day (TID) | INTRAMUSCULAR | Status: DC
  Administered 2020-10-28 – 2020-10-30 (×6): 4 mg via INTRAVENOUS
  Filled 2020-10-26 (×6): qty 1

## 2020-10-26 MED ORDER — ONDANSETRON HCL 4 MG/2ML IJ SOLN: 4.0000 mg | Freq: Once | INTRAMUSCULAR | Status: DC | PRN

## 2020-10-26 MED ORDER — BUPIVACAINE HCL (PF) 0.5 % IJ SOLN
INTRAMUSCULAR | Status: AC
  Filled 2020-10-26: qty 30

## 2020-10-26 SURGICAL SUPPLY — 107 items
APL SKNCLS STERI-STRIP NONHPOA (GAUZE/BANDAGES/DRESSINGS)
BAG COUNTER SPONGE SURGICOUNT (BAG) ×5 IMPLANT
BAG SPNG CNTER NS LX DISP (BAG) ×4
BAND INSRT 18 STRL LF DISP RB (MISCELLANEOUS) ×4
BAND RUBBER #18 3X1/16 STRL (MISCELLANEOUS) ×4 IMPLANT
BENZOIN TINCTURE PRP APPL 2/3 (GAUZE/BANDAGES/DRESSINGS) IMPLANT
BLADE CLIPPER SURG (BLADE) ×3 IMPLANT
BLADE SAW GIGLI 16 STRL (MISCELLANEOUS) IMPLANT
BLADE SURG 15 STRL LF DISP TIS (BLADE) IMPLANT
BLADE SURG 15 STRL SS (BLADE)
BLADE ULTRA TIP 2M (BLADE) ×3 IMPLANT
BNDG CMPR 75X41 PLY ABS (GAUZE/BANDAGES/DRESSINGS) ×2
BNDG CMPR 75X41 PLY HI ABS (GAUZE/BANDAGES/DRESSINGS) ×2
BNDG GAUZE ELAST 4 BULKY (GAUZE/BANDAGES/DRESSINGS) ×3 IMPLANT
BNDG STRETCH 4X75 NS LF (GAUZE/BANDAGES/DRESSINGS) ×1 IMPLANT
BNDG STRETCH 4X75 STRL LF (GAUZE/BANDAGES/DRESSINGS) ×2 IMPLANT
BUR ACORN 6.0 PRECISION (BURR) ×3 IMPLANT
BUR ROUND FLUTED 4 SOFT TCH (BURR) IMPLANT
BUR SPIRAL ROUTER 2.3 (BUR) ×3 IMPLANT
CANISTER SUCT 3000ML PPV (MISCELLANEOUS) ×6 IMPLANT
CANISTER SUCTION SONOPET IQ (CANNISTER) ×2 IMPLANT
CARTRIDGE OIL MAESTRO DRILL (MISCELLANEOUS) ×2 IMPLANT
CASSETTE SUCT IRRIG SONOPET IQ (MISCELLANEOUS) ×2 IMPLANT
CATH VENTRIC 35X38 W/TROCAR LG (CATHETERS) IMPLANT
CLIP VESOCCLUDE MED 6/CT (CLIP) ×2 IMPLANT
CNTNR URN SCR LID CUP LEK RST (MISCELLANEOUS) ×2 IMPLANT
CONT SPEC 4OZ STRL OR WHT (MISCELLANEOUS) ×3
COVER MAYO STAND STRL (DRAPES) IMPLANT
DECANTER SPIKE VIAL GLASS SM (MISCELLANEOUS) ×3 IMPLANT
DIFFUSER DRILL AIR PNEUMATIC (MISCELLANEOUS) ×3 IMPLANT
DRAIN SUBARACHNOID (WOUND CARE) IMPLANT
DRAPE HALF SHEET 40X57 (DRAPES) ×3 IMPLANT
DRAPE MICROSCOPE LEICA (MISCELLANEOUS) ×2 IMPLANT
DRAPE NEUROLOGICAL W/INCISE (DRAPES) ×3 IMPLANT
DRAPE STERI IOBAN 125X83 (DRAPES) IMPLANT
DRAPE SURG 17X23 STRL (DRAPES) IMPLANT
DRAPE WARM FLUID 44X44 (DRAPES) ×3 IMPLANT
DRSG ADAPTIC 3X8 NADH LF (GAUZE/BANDAGES/DRESSINGS) IMPLANT
DRSG TELFA 3X8 NADH (GAUZE/BANDAGES/DRESSINGS) ×6 IMPLANT
DURAPREP 6ML APPLICATOR 50/CS (WOUND CARE) ×3 IMPLANT
ELECT REM PT RETURN 9FT ADLT (ELECTROSURGICAL) ×3
ELECTRODE REM PT RTRN 9FT ADLT (ELECTROSURGICAL) ×2 IMPLANT
EVACUATOR 1/8 PVC DRAIN (DRAIN) IMPLANT
EVACUATOR SILICONE 100CC (DRAIN) IMPLANT
FORCEPS BIPOLAR SPETZLER 8 1.0 (NEUROSURGERY SUPPLIES) ×3 IMPLANT
GAUZE 4X4 16PLY ~~LOC~~+RFID DBL (SPONGE) ×1 IMPLANT
GAUZE SPONGE 4X4 12PLY STRL (GAUZE/BANDAGES/DRESSINGS) ×3 IMPLANT
GLOVE EXAM NITRILE XL STR (GLOVE) IMPLANT
GLOVE SURG ENC MOIS LTX SZ7.5 (GLOVE) ×1 IMPLANT
GLOVE SURG LTX SZ7 (GLOVE) ×6 IMPLANT
GLOVE SURG UNDER POLY LF SZ7 (GLOVE) ×2 IMPLANT
GLOVE SURG UNDER POLY LF SZ7.5 (GLOVE) ×8 IMPLANT
GOWN STRL REUS W/ TWL LRG LVL3 (GOWN DISPOSABLE) ×5 IMPLANT
GOWN STRL REUS W/ TWL XL LVL3 (GOWN DISPOSABLE) ×1 IMPLANT
GOWN STRL REUS W/TWL 2XL LVL3 (GOWN DISPOSABLE) IMPLANT
GOWN STRL REUS W/TWL LRG LVL3 (GOWN DISPOSABLE) ×9
GOWN STRL REUS W/TWL XL LVL3 (GOWN DISPOSABLE) ×3
GRAFT DURAGEN MATRIX 3WX3L (Graft) ×3 IMPLANT
GRAFT DURAGEN MATRIX 3X3 SNGL (Graft) IMPLANT
HEMOSTAT POWDER KIT SURGIFOAM (HEMOSTASIS) ×3 IMPLANT
HEMOSTAT SURGICEL 2X14 (HEMOSTASIS) ×3 IMPLANT
HOOK DURA 1/2IN (MISCELLANEOUS) ×3 IMPLANT
IV NS 1000ML (IV SOLUTION) ×3
IV NS 1000ML BAXH (IV SOLUTION) ×2 IMPLANT
KIT BASIN OR (CUSTOM PROCEDURE TRAY) ×3 IMPLANT
KIT DRAIN CSF ACCUDRAIN (MISCELLANEOUS) IMPLANT
KIT TURNOVER KIT B (KITS) ×3 IMPLANT
KNIFE ARACHNOID DISP AM-24-S (MISCELLANEOUS) ×1 IMPLANT
MARKER SPHERE PSV REFLC 13MM (MARKER) ×6 IMPLANT
NDL SPNL 18GX3.5 QUINCKE PK (NEEDLE) IMPLANT
NEEDLE HYPO 22GX1.5 SAFETY (NEEDLE) ×3 IMPLANT
NEEDLE SPNL 18GX3.5 QUINCKE PK (NEEDLE) IMPLANT
NS IRRIG 1000ML POUR BTL (IV SOLUTION) ×8 IMPLANT
OIL CARTRIDGE MAESTRO DRILL (MISCELLANEOUS) ×3
PACK BATTERY CMF DISP FOR DVR (ORTHOPEDIC DISPOSABLE SUPPLIES) ×1 IMPLANT
PACK CRANIOTOMY CUSTOM (CUSTOM PROCEDURE TRAY) ×3 IMPLANT
PAD DRESSING TELFA 3X8 NADH (GAUZE/BANDAGES/DRESSINGS) IMPLANT
PATTIES SURGICAL .25X.25 (GAUZE/BANDAGES/DRESSINGS) IMPLANT
PATTIES SURGICAL .5 X.5 (GAUZE/BANDAGES/DRESSINGS) IMPLANT
PATTIES SURGICAL .5 X3 (DISPOSABLE) IMPLANT
PATTIES SURGICAL 1/4 X 3 (GAUZE/BANDAGES/DRESSINGS) IMPLANT
PATTIES SURGICAL 1X1 (DISPOSABLE) IMPLANT
PIN MAYFIELD SKULL DISP (PIN) ×3 IMPLANT
PLATE UNIV CMF 16 2H (Plate) ×6 IMPLANT
SCREW UNIII AXS SD 1.5X4 (Screw) ×6 IMPLANT
SPECIMEN JAR SMALL (MISCELLANEOUS) IMPLANT
SPONGE NEURO XRAY DETECT 1X3 (DISPOSABLE) IMPLANT
SPONGE SURGIFOAM ABS GEL 100 (HEMOSTASIS) ×3 IMPLANT
SPONGE SURGIFOAM ABS GEL SZ50 (HEMOSTASIS) ×2 IMPLANT
STAPLER VISISTAT 35W (STAPLE) ×3 IMPLANT
STOCKINETTE 6  STRL (DRAPES) ×3
STOCKINETTE 6 STRL (DRAPES) IMPLANT
SUT ETHILON 3 0 FSL (SUTURE) IMPLANT
SUT ETHILON 3 0 PS 1 (SUTURE) IMPLANT
SUT NURALON 4 0 TR CR/8 (SUTURE) ×5 IMPLANT
SUT SILK 0 TIES 10X30 (SUTURE) IMPLANT
SUT VIC AB 0 CT1 18XCR BRD8 (SUTURE) ×3 IMPLANT
SUT VIC AB 0 CT1 8-18 (SUTURE) ×3
SUT VIC AB 3-0 SH 8-18 (SUTURE) ×6 IMPLANT
TAPE CLOTH 1X10 TAN NS (GAUZE/BANDAGES/DRESSINGS) ×3 IMPLANT
TIP TISSUE SONOPET IQ STD 12 (TIP) ×1 IMPLANT
TOWEL GREEN STERILE (TOWEL DISPOSABLE) ×3 IMPLANT
TOWEL GREEN STERILE FF (TOWEL DISPOSABLE) ×3 IMPLANT
TRAY FOLEY MTR SLVR 16FR STAT (SET/KITS/TRAYS/PACK) ×3 IMPLANT
TUBE CONNECTING 12X1/4 (SUCTIONS) ×3 IMPLANT
UNDERPAD 30X36 HEAVY ABSORB (UNDERPADS AND DIAPERS) ×2 IMPLANT
WATER STERILE IRR 1000ML POUR (IV SOLUTION) ×3 IMPLANT

## 2020-10-26 NOTE — Anesthesia Procedure Notes (Signed)
Arterial Line Insertion Start/End9/10/2020 7:10 AM, 10/26/2020 7:15 AM Performed by: Barrington Ellison, CRNA  Preanesthetic checklist: patient identified and risks and benefits discussed Lidocaine 1% used for infiltration Left, radial was placed Catheter size: 20 G Hand hygiene performed  and maximum sterile barriers used  Allen's test indicative of satisfactory collateral circulation Attempts: 1 Procedure performed without using ultrasound guided technique. Following insertion, dressing applied and Biopatch. Post procedure assessment: normal  Patient tolerated the procedure well with no immediate complications. Additional procedure comments: Inserted by Joya Salm, SRNA.

## 2020-10-26 NOTE — H&P (Signed)
Chief Complaint  Lung cancer, brain metasasis  History of Present Illness  Alan [name] is a 70 y.o. male with a history of metastatic lung cancer who recently underwent staging MRI demonstrating a relatively large right frontal metastasis.  Patient's case was discussed at the multidisciplinary neuro-oncology conference where consensus opinion was to treat with fractionated stereotactic radiosurgery preoperatively followed by surgical resection.  Patient has undergone 3 fractions of stereotactic radiosurgery and presents today for surgical resection.  Past Medical History   Past Medical History:  Diagnosis Date   Anxiety    Arthritis    Dysrhythmia    Hyperlipemia    Hypertension    Hyperthyroidism    Lung cancer (HCC)    Myocardial infarction ([place])    Paroxysmal atrial fibrillation ([place])    Port-A-Cath in place 11/14/2019   Tachycardia     Past Surgical History   Past Surgical History:  Procedure Laterality Date   CARDIAC CATHETERIZATION     PORTACATH PLACEMENT Right 11/01/2019   Procedure: INSERTION PORT-A-CATH;  Surgeon: Aviva Signs, MD;  Location: [facility];  Service: General;  Laterality: Right;    Social History   Social History   Tobacco Use   Smoking status: Former    Years: 45.00    Types: Cigars, Cigarettes    Quit date: 10/19/2014    Years since quitting: 6.0   Smokeless tobacco: Never  Vaping Use   Vaping Use: Never used  Substance Use Topics   Alcohol use: Yes    Comment: occ   Drug use: No    Medications   Prior to Admission medications   Medication Sig Start Date End Date Taking? Authorizing Provider  acetaminophen (TYLENOL) 500 MG tablet Take 500 mg by mouth every 6 (six) hours as needed for moderate pain or headache.   Yes [provider]  chlorproMAZINE (THORAZINE) 25 MG tablet Take 1 tablet (25 mg total) by mouth every 6 (six) hours as needed. 10/09/20  Yes Derek Jack, MD  dexamethasone (DECADRON) 4 MG tablet Take 1 tablet  (4 mg total) by mouth every 6 (six) hours. Patient taking differently: Take 4 mg by mouth 3 (three) times daily. 10/02/20  Yes Derek Jack, MD  dextromethorphan (DELSYM) 30 MG/5ML liquid Take 30 mg by mouth as needed for cough.   Yes [provider]  Durvalumab (IMFINZI IV) Inject into the vein.   Yes [provider]  ELIQUIS 5 MG TABS tablet Take 1 tablet by mouth twice daily 06/29/20  Yes Branch, Alphonse Guild, MD  levothyroxine (SYNTHROID) 100 MCG tablet Take 1 tablet (100 mcg total) by mouth daily before breakfast. 07/10/20  Yes Derek Jack, MD  lidocaine (XYLOCAINE) 2 % solution Use as directed 15 mLs in the mouth or throat as needed for mouth pain.   Yes [provider]  lidocaine-prilocaine (EMLA) cream APPLY SMALL AMOUNT TO PORTA-CATH SITE AND COVER WITH PLASTIC WRAP 1 HOUR PRIOR TO CHEMO APPOINTMENTS. 10/09/20  Yes Derek Jack, MD  Melatonin 10 MG TABS Take 10 tablets by mouth at bedtime as needed (sleep). 08/29/20  Yes Derek Jack, MD  metoprolol tartrate (LOPRESSOR) 25 MG tablet Take one tablet(25 mg )  in the AM and take 1/2 Tablet (12.5mg ) in the PM 12/30/19  Yes Strader, [place] M, PA-C  ALPRAZolam Duanne Moron) 0.25 MG tablet TAKE 1 TABLET BY MOUTH THREE TIMES DAILY AS NEEDED FOR ANXIETY 08/27/20   Leron Croak, Eugene Garnet M, PA-C  dronabinol (MARINOL) 5 MG capsule Take 1 capsule (5 mg total)  by mouth 2 (two) times daily before a meal. Patient not taking: Reported on 10/19/2020 05/14/20   Derek Jack, MD    Allergies  No Known Allergies  Review of Systems  ROS  Neurologic Exam  Awake, alert, oriented Memory and concentration grossly intact Speech fluent, appropriate CN grossly intact Motor exam: Upper Extremities Deltoid Bicep Tricep Grip  Right 5/5 5/5 5/5 5/5  Left 5/5 5/5 5/5 5/5   Lower Extremities IP Quad PF DF EHL  Right 5/5 5/5 5/5 5/5 5/5  Left 5/5 5/5 5/5 5/5 5/5   Sensation grossly intact to LT  Imaging   MRI of the brain with and without contrast was personally reviewed and demonstrates a peripherally enhancing lesion adjacent to the right frontal horn and caudate nucleus measuring approximately 3 cm.  Impression  - 70 y.o. 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  We have discussed the treatment options for brain tumors including recommendation for stereotactic radiosurgery followed by surgical resection.  We have discussed the details of the surgery as well as the expected postoperative course and recovery.  We have also reviewed the associated risks of surgery, benefits, and alternatives.  All their questions were answered and he provided informed consent to proceed.  Consuella Lose, MD [facility]

## 2020-10-26 NOTE — Anesthesia Preprocedure Evaluation (Addendum)
Anesthesia Evaluation  Patient identified by MRN, date of birth, ID band Patient awake    Reviewed: Allergy & Precautions, NPO status , Patient's Chart, lab work & pertinent test results  Airway Mallampati: II  TM Distance: >3 FB Neck ROM: Full    Dental  (+) Edentulous Upper, Edentulous Lower   Pulmonary former smoker,  Lung cancer    Pulmonary exam normal breath sounds clear to auscultation       Cardiovascular hypertension, + CAD and + Past MI  Normal cardiovascular exam+ dysrhythmias Atrial Fibrillation  Rhythm:Regular Rate:Normal  ECG: a-fib, rate 99   Neuro/Psych Anxiety    GI/Hepatic negative GI ROS, Neg liver ROS,   Endo/Other  Hypothyroidism   Renal/GU negative Renal ROS     Musculoskeletal negative musculoskeletal ROS (+)   Abdominal   Peds  Hematology  (+) Blood dyscrasia, , HLD   Anesthesia Other Findings Brain Metastasis  Reproductive/Obstetrics                            Anesthesia Physical Anesthesia Plan  ASA: 3  Anesthesia Plan: General   Post-op Pain Management:    Induction: Intravenous  PONV Risk Score and Plan: 2 and Ondansetron, Dexamethasone and Treatment may vary due to age or medical condition  Airway Management Planned: Oral ETT  Additional Equipment: Arterial line  Intra-op Plan:   Post-operative Plan: Extubation in OR  Informed Consent: I have reviewed the patients History and Physical, chart, labs and discussed the procedure including the risks, benefits and alternatives for the proposed anesthesia with the patient or authorized representative who has indicated his/her understanding and acceptance.       Plan Discussed with: CRNA and Surgeon  Anesthesia Plan Comments: (Potential central line placement discussed)      Anesthesia Quick Evaluation

## 2020-10-26 NOTE — Transfer of Care (Signed)
Immediate Anesthesia Transfer of Care Note  Patient: Alan [name]  Procedure(s) Performed: Stereotactic Right frontal craniotomy for resection of tumor (Right) APPLICATION OF CRANIAL NAVIGATION  Patient Location: PACU  Anesthesia Type:General  Level of Consciousness: awake, alert , oriented and patient cooperative  Airway & Oxygen Therapy: Patient Spontanous Breathing and Patient connected to nasal cannula oxygen  Post-op Assessment: Report given to RN, Post -op Vital signs reviewed and stable and Patient moving all extremities X 4  Post vital signs: Reviewed and stable  Last Vitals:  Vitals Value Taken Time  BP 115/71 10/26/20 1045  Temp    Pulse 86 10/26/20 1046  Resp 18 10/26/20 1046  SpO2 96 % 10/26/20 1046  Vitals shown include unvalidated device data.  Last Pain:  Vitals:   10/26/20 0608  TempSrc:   PainSc: 0-No pain         Complications: No notable events documented.

## 2020-10-26 NOTE — Anesthesia Procedure Notes (Signed)
Procedure Name: Intubation Date/Time: 10/26/2020 7:55 AM Performed by: Barrington Ellison, CRNA Pre-anesthesia Checklist: Patient identified, Emergency Drugs available, Suction available and Patient being monitored Patient Re-evaluated:Patient Re-evaluated prior to induction Oxygen Delivery Method: Circle System Utilized Preoxygenation: Pre-oxygenation with 100% oxygen Induction Type: IV induction Ventilation: Mask ventilation without difficulty Laryngoscope Size: Miller and 2 Grade View: Grade I Tube type: Oral Tube size: 7.5 mm Number of attempts: 1 Airway Equipment and Method: Stylet and Oral airway Placement Confirmation: ETT inserted through vocal cords under direct vision, positive ETCO2 and breath sounds checked- equal and bilateral Secured at: 22 cm Tube secured with: Tape Dental Injury: Teeth and Oropharynx as per pre-operative assessment  Comments: Intubation by Evern Core, SRNA

## 2020-10-26 NOTE — Op Note (Signed)
NEUROSURGERY OPERATIVE NOTE   PREOP DIAGNOSIS:  Metastatic brain tumor   POSTOP DIAGNOSIS: Same  PROCEDURE: Stereotactic right frontal craniotomy for resection of tumor Use of intraoperative microscope for microdissection  SURGEON: Dr. Consuella Lose, MD  ASSISTANT: Dr. Deatra Ina, MD  ANESTHESIA: General Endotracheal  EBL: 100cc  SPECIMENS: Right frontal tumor for permanent pathology  DRAINS: None  COMPLICATIONS: Intraoperative conversion to a-fib with RVR  CONDITION: Hemodynamically stable to PACU, reverted back to sinus rhythm  HISTORY: Alan [name] is a 70 y.o. male with a history of metastatic lung cancer who recently underwent staging PET scan demonstrating hypermetabolic lesion within the right frontal region.  Further work-up included MRI demonstrating a 3 cm ring-enhancing lesion adjacent to the right frontal horn.  Patient's case was discussed at the multidisciplinary neuro-oncology conference where the consensus opinion was to proceed with stereotactic preoperative radiosurgery in 3 fractions followed by surgical resection.  Patient underwent preoperative SRS over the past week and presents today for surgery.  The risks, benefits, and alternatives to surgery as well as the expected postoperative course and recovery were all reviewed in detail with the patient and his wife in the office.  After all their questions were answered informed consent was obtained and witnessed.  PROCEDURE IN DETAIL: The patient was brought to the operating room. After induction of general anesthesia, the patient was positioned on the operative table in the [place] head holder in the supine position.  Preoperative stereotactic MRI scan was then collected with surface markers and chest satisfactory accuracy was achieved.  Stereotactic system was then used to mark out a curvilinear right frontal skin incision which would allow access to the underlying tumor.  All pressure points were  meticulously padded. Skin incision was then marked out and prepped and draped in the usual sterile fashion.  After timeout was conducted, the incision was infiltrated with local anesthetic with epinephrine.  Incision was made sharply and carried through the galea.  Raney clips were applied.  Periosteum was elevated.  Small portion of the temporal fascia along the superior temporal line was elevated.  Skin flap was then reflected anteriorly.  Stereotactic system was used to create 2 bur holes just lateral to the midline and a third bur hole was created laterally.  These were connected with a craniotome and the frontal craniotomy flap was elevated.  Hemostasis on the epidural plane was easily secured with bipolar electrocautery.  Dura was then opened in semilunar fashion based medially and tacked up with 4 Nurolon stitches.  Stereotactic system was again used to identify the location closest to the surface which was just deep to the frontal gyrus.  The pia of the middle frontal gyrus was coagulated and incised.  The ultrasonic aspirator was then used to dissect through the white matter and the tumor capsule was identified.  At this point the microscope was draped sterilely and brought into the field and the remainder of the case was done under the microscope using microdissection technique.  As I began to dissect the tumor, the anesthesia service noted the patient had converted into atrial fibrillation with rapid ventricular response with perfusing every one to two beats.  Ultrasonic aspirator was used to dissect the gliotic capsule around the tumor which appeared to be slightly more blue than the surrounding white matter.  The tumor is relatively rapidly dissected circumferentially until the ependyma of the right frontal horn was identified and a small opening in the ependyma was made into the ventricular system.  During the dissection, multiple medications were added by the anesthesia service in order to try to  convert the patient back to a more normal rhythm which was unsuccessful.  Patient appeared to be relatively unstable from a hemodynamic standpoint.  Once the tumor was completely dissected circumferentially, it was removed and sent for permanent pathology.  The resection cavity was inspected and there did not appear to be any residual tumor.  All margins appeared to be relatively normal white matter.  The resection cavity was also checked with the stereotactic system and appeared to be around the periphery of the tumor.  Hemostasis was easily secured in the tumor bed using a combination of morselized Gelfoam with thrombin and bipolar electrocautery.  The dura was then reapproximated with interrupted 4 Nurolon stitches.  A sheet of DuraGen collagen onlay graft was placed over the dural surface.  The bone flap was replaced and plated with standard titanium plates and screws.  The galea was then reapproximated with interrupted 0 and 3-0 Vicryl stitches and the skin was closed with staples.  At the end of the case all sponge, needle, instrument, and cottonoid counts were correct.  At the end of the case the patient's rhythm converted back to sinus and he was extubated and taken to the PACU.    Consuella Lose, MD [facility]

## 2020-10-27 ENCOUNTER — Encounter (HOSPITAL_COMMUNITY): Payer: Self-pay | Admitting: Neurosurgery

## 2020-10-27 DIAGNOSIS — I4821 Permanent atrial fibrillation: Secondary | ICD-10-CM | POA: Diagnosis not present

## 2020-10-27 LAB — POCT I-STAT 7, (LYTES, BLD GAS, ICA,H+H)
Acid-base deficit: 1 mmol/L (ref 0.0–2.0)
Bicarbonate: 22.9 mmol/L (ref 20.0–28.0)
Calcium, Ion: 1.12 mmol/L — ABNORMAL LOW (ref 1.15–1.40)
HCT: 29 % — ABNORMAL LOW (ref 39.0–52.0)
Hemoglobin: 9.9 g/dL — ABNORMAL LOW (ref 13.0–17.0)
O2 Saturation: 100 %
Patient temperature: 34.3
Potassium: 3.9 mmol/L (ref 3.5–5.1)
Sodium: 135 mmol/L (ref 135–145)
TCO2: 24 mmol/L (ref 22–32)
pCO2 arterial: 32.2 mmHg (ref 32.0–48.0)
pH, Arterial: 7.449 (ref 7.350–7.450)
pO2, Arterial: 394 mmHg — ABNORMAL HIGH (ref 83.0–108.0)

## 2020-10-27 LAB — TSH: TSH: 0.409 u[IU]/mL (ref 0.350–4.500)

## 2020-10-27 LAB — RENAL FUNCTION PANEL
Albumin: 2 g/dL — ABNORMAL LOW (ref 3.5–5.0)
Anion gap: 8 (ref 5–15)
BUN: 19 mg/dL (ref 8–23)
CO2: 19 mmol/L — ABNORMAL LOW (ref 22–32)
Calcium: 7.7 mg/dL — ABNORMAL LOW (ref 8.9–10.3)
Chloride: 103 mmol/L (ref 98–111)
Creatinine, Ser: 0.85 mg/dL (ref 0.61–1.24)
GFR, Estimated: >60 mL/min (ref >60)
Glucose, Bld: 218 mg/dL — ABNORMAL HIGH (ref 70–99)
Phosphorus: 3.3 mg/dL (ref 2.5–4.6)
Potassium: 4.8 mmol/L (ref 3.5–5.1)
Sodium: 130 mmol/L — ABNORMAL LOW (ref 135–145)

## 2020-10-27 LAB — MAGNESIUM: Magnesium: 1.4 mg/dL — ABNORMAL LOW (ref 1.7–2.4)

## 2020-10-27 MED ORDER — DIGOXIN 125 MCG PO TABS
0.1250 mg | ORAL_TABLET | Freq: Every day | ORAL | Status: DC
  Administered 2020-10-27 – 2020-10-30 (×4): 0.125 mg via ORAL
  Filled 2020-10-27 (×5): qty 1

## 2020-10-27 MED ORDER — DIGOXIN 0.25 MG/ML IJ SOLN
0.5000 mg | Freq: Once | INTRAMUSCULAR | Status: AC
  Administered 2020-10-27: 0.5 mg via INTRAVENOUS
  Filled 2020-10-27: qty 2

## 2020-10-27 MED ORDER — METOPROLOL TARTRATE 5 MG/5ML IV SOLN
2.5000 mg | Freq: Four times a day (QID) | INTRAVENOUS | Status: DC
  Administered 2020-10-27 – 2020-10-28 (×4): 2.5 mg via INTRAVENOUS
  Filled 2020-10-27 (×4): qty 5

## 2020-10-27 NOTE — Progress Notes (Signed)
Cardiology Consultation:   Patient ID: MACLOVIO HENSON MRN: [id number]; DOB: 11-Sep-1950  Admit date: 10/26/2020 Date of Consult: 10/27/2020  PCP:  [facility] Cardiologist:  Carlyle Dolly, MD   {    Patient Profile:   Alan [name] is a 70 y.o. male with a hx of hypertension, hyperlipidemia, coronary artery disease, permanent atrial fibrillation, metastatic lung cancer, now status postcraniotomy for resection of brain metastasis who is being seen 10/27/2020 for the evaluation of paroxysmal atrial fibrillation at the request of Emelda Brothers MD.  History of Present Illness:   Last echocardiogram September 2021 showed normal LV function, moderate left ventricular hypertrophy, small pericardial fusion.  Patient has known metastatic lung cancer.  He underwent stereotactic right frontal craniotomy for resection of tumor on September 9.  He has developed paroxysmal atrial fibrillation and cardiology asked to evaluate.  At the time of my evaluation patient denies dyspnea, chest pain, palpitations or dizziness.  Complains of mild headache.   Past Medical History:  Diagnosis Date   Anxiety    Arthritis    Dysrhythmia    Hyperlipemia    Hypertension    Hyperthyroidism    Lung cancer (HCC)    Myocardial infarction ([place])    Paroxysmal atrial fibrillation ([place])    Port-A-Cath in place 11/14/2019   Tachycardia     Past Surgical History:  Procedure Laterality Date   CARDIAC CATHETERIZATION     PORTACATH PLACEMENT Right 11/01/2019   Procedure: INSERTION PORT-A-CATH;  Surgeon: Aviva Signs, MD;  Location: [facility];  Service: General;  Laterality: Right;      Inpatient Medications: Scheduled Meds:  Chlorhexidine Gluconate Cloth  6 each Topical Daily   dexamethasone (DECADRON) injection  4 mg Intravenous Q6H   Followed by   Derrill Memo ON 10/28/2020] dexamethasone (DECADRON) injection  4 mg Intravenous Q8H   digoxin  0.125 mg Oral Daily    levothyroxine  100 mcg Oral QAC breakfast   metoprolol tartrate  12.5 mg Oral QHS   metoprolol tartrate  25 mg Oral q morning   pantoprazole (PROTONIX) IV  40 mg Intravenous QHS   Continuous Infusions:  sodium chloride 75 mL/hr at 10/27/20 0800   diltiazem (CARDIZEM) infusion 15 mg/hr (10/26/20 0957)   levETIRAcetam 500 mg (10/27/20 1008)   PRN Meds: acetaminophen **OR** acetaminophen, ALPRAZolam, bisacodyl, HYDROcodone-acetaminophen, labetalol, lidocaine, melatonin, morphine injection, ondansetron **OR** ondansetron (ZOFRAN) IV, promethazine  Allergies:   No Known Allergies  Social History:   Social History   Socioeconomic History   Marital status: Married    Spouse name: Not on file   Number of children: 1   Years of education: Not on file   Highest education level: Not on file  Occupational History   Occupation: retired   Occupation: Glass blower/designer: [employer]  Tobacco Use   Smoking status: Former    Years: 45.00    Types: Cigars, Cigarettes    Quit date: 10/19/2014    Years since quitting: 6.0   Smokeless tobacco: Never  Vaping Use   Vaping Use: Never used  Substance and Sexual Activity   Alcohol use: Yes    Comment: occ   Drug use: No   Sexual activity: Not on file  Other Topics Concern   Not on file  Social History Narrative   Not on file   Social Determinants of Health   Financial Resource Strain: Not on file  Food Insecurity: Not on file  Transportation  Needs: Not on file  Physical Activity: Not on file  Stress: Not on file  Social Connections: Not on file  Intimate Partner Violence: Not on file    Family History:    Family History  Problem Relation Age of Onset   Hypertension Father    Dementia Father        died age 61   Heart disease Father    Heart failure Other      ROS:  Please see the history of present illness.  Headache. All other ROS reviewed and negative.     Physical Exam/Data:   Vitals:   10/27/20 0800 10/27/20 0900  10/27/20 1005 10/27/20 1110  BP: 98/61 (!) 98/54 95/79   Pulse: (!) 102 (!) 114 (!) 172 (!) 122  Resp: (!) 30 (!) 29    Temp: 99.4 F (37.4 C)     TempSrc: Oral     SpO2: 98% 95%    Weight:      Height:        Intake/Output Summary (Last 24 hours) at 10/27/2020 1128 Last data filed at 10/27/2020 0800 Gross per 24 hour  Intake 1327.02 ml  Output 1420 ml  Net -92.98 ml   Last 3 Weights 10/26/2020 10/24/2020 10/15/2020  Weight (lbs) 151 lb 3.8 oz 151 lb 3.2 oz 152 lb 4 oz  Weight (kg) 68.6 kg 68.584 kg 69.06 kg     Body mass index is 25.17 kg/m.  General:  Well nourished, well developed, in no acute distress HEENT: Status post craniotomy Neck: no JVD Endocrine:  No thryomegaly Vascular: No carotid bruits; FA pulses 2+ bilaterally without bruits  Cardiac:  normal S1, S2; irregular and tachycardic Lungs:  clear to auscultation bilaterally, no wheezing, rhonchi or rales  Abd: soft, nontender, no hepatomegaly  Ext: no edema Musculoskeletal:  No deformities, BUE and BLE strength normal and equal Skin: warm and dry  Neuro:  CNs 2-12 intact, no focal abnormalities noted Psych:  Normal affect   EKG:  The EKG was personally reviewed and demonstrates: Atrial fibrillation with rapid ventricular response, incomplete right bundle branch block, nonspecific ST changes. Telemetry:  Telemetry was personally reviewed and demonstrates: Atrial fibrillation with rapid ventricular response   Radiology/Studies:  MR BRAIN W WO CONTRAST  Result Date: 10/26/2020 CLINICAL DATA:  Brain/CNS neoplasm, assess treatment response EXAM: MRI HEAD WITHOUT AND WITH CONTRAST TECHNIQUE: Multiplanar, multiecho pulse sequences of the brain and surrounding structures were obtained without and with intravenous contrast. CONTRAST:  6.61mL GADAVIST GADOBUTROL 1 MMOL/ML IV SOLN COMPARISON:  MRI head 10/12/2020. FINDINGS: Brain: Interval right frontal craniotomy and resection of the previously seen enhancing right frontal  lesion. Expected hemorrhage and mild devitalized tissue along the periphery of the resection cavity. Small-to-moderate volume anti dependent pneumocephalus along the right frontal convexity with small volume extra-axial fluid/hemorrhage. Resulting mild mass effect on the adjacent frontal lobe. Right frontal lobe edema appears improved relative to prior. Mild (3-4 mm) leftward midline shift, slightly increased. No nodular or masslike enhancement at the resection site to suggest residual tumor. No enhancing lesions seen elsewhere. No acute infarct. No hydrocephalus. Trace susceptibility artifact and T2 hypointensity in bilateral occipital horns, compatible with trace intraventricular hemorrhage. Additional mild for age scattered T2 hyperintensity in the white matter, nonspecific but compatible with chronic microvascular ischemic disease. Vascular: Major arterial flow voids are maintained the skull base. Skull and upper cervical spine: Right frontal craniotomy. Otherwise normal marrow signal. Sinuses/Orbits: Moderate scattered paranasal sinus mucosal thickening with air-fluid levels and  frothy secretions in bilateral maxillary sinuses. No acute orbital findings. Other: Small left and trace right mastoid effusions. IMPRESSION: 1. Postoperative changes associated with right frontal craniotomy and resection of a right frontal lobe enhancing lesion. Small to moderate pneumocephalus and small volume extra-axial fluid/hemorrhage along the right frontal convexity with overall decreased right frontal lobe edema and slightly increased mild (3-4 mm) leftward midline shift. No nodular/masslike enhancement to suggest residual tumor. 2. Trace intraventricular hemorrhage layering in the occipital horns bilaterally. 3. Moderate scattered paranasal sinus mucosal thickening with air-fluid levels and frothy secretions in bilateral maxillary sinuses. Electronically Signed   By: Margaretha Sheffield M.D.   On: 10/26/2020 17:07      Assessment and Plan:   Permanent atrial fibrillation-in reviewing his records he has been in atrial fibrillation for at least 1 year.  There has been some difficulty with rate control due to borderline blood pressure.  His home dose of Lopressor is 25 mg in the morning and 12.5 mg in the evening.  Patient's heart rate is elevated postoperatively likely secondary to the hyperadrenergic state related to recent craniotomy.  He has been loaded with digoxin with some improvement in his rate.  We will add Lopressor 2.5 mg IV every 6 hours with parameters (hold for systolic blood pressure less than 100).  His heart rate should improve as he gets further away from his recent surgery.  If his rate becomes more difficult to control could consider short-term amiodarone as well.  We will transition back to oral metoprolol later.  CHA2DS2-VASc 3.  Can resume apixaban later if okay with neurology.  On hold now status post craniotomy. Status postcraniotomy for resection of brain metastasis-Per neurosurgery. Metastatic lung cancer   Risk Assessment/Risk Scores:      :[id number]}   CHA2DS2-VASc Score = 3  This indicates a 3.2% annual risk of stroke. The patient's score is based upon: CHF History: 0 HTN History: 1 Diabetes History: 0 Stroke History: 0 Vascular Disease History: 1 Age Score: 1 Gender Score: 0     For questions or updates, please contact [facility] Please consult [email or web address] for contact info under    Signed, Kirk Ruths, MD  10/27/2020 11:28 AM

## 2020-10-27 NOTE — Progress Notes (Signed)
0903: Dr. Zada Finders called for patient heart rate sustained in afib RVR 160s-180s. All other vital signs unchanged at this time.   0944: Dr. Zada Finders at bedside, patient heart rate still 160s-180s. See new orders.  0959: Loading dose 0.5mg  digoxin given IV  1024:  Patient's heart rate sustained in afib RVR rates of 160-180 following digoxin loading dose IV administration. Dr. Zada Finders called and notified of no improvements following digoxin administration. Per Dr. Zada Finders he will call cardiology. No new orders placed at this time.   RN will continue to monitor.

## 2020-10-27 NOTE — Anesthesia Postprocedure Evaluation (Signed)
Anesthesia Post Note  Patient: Alan [name]  Procedure(s) Performed: Stereotactic Right frontal craniotomy for resection of tumor with sonopet (Right) [place]     Patient location during evaluation: PACU Anesthesia Type: General Level of consciousness: awake Pain management: pain level controlled Vital Signs Assessment: post-procedure vital signs reviewed and stable Respiratory status: spontaneous breathing, nonlabored ventilation, respiratory function stable and patient connected to nasal cannula oxygen Cardiovascular status: blood pressure returned to baseline and stable Postop Assessment: no apparent nausea or vomiting Anesthetic complications: no Comments: Diltiazem infusion continued post-operatively   No notable events documented.  Last Vitals:  Vitals:   10/27/20 0000 10/27/20 0400  BP:    Pulse:    Resp:    Temp: 36.7 C 36.6 C  SpO2:      Last Pain:  Vitals:   10/27/20 0400  TempSrc: Oral  PainSc:                  [name] P Matty Vanroekel

## 2020-10-27 NOTE — Progress Notes (Addendum)
Neurosurgery Service Progress Note  Subjective: No acute events overnight, went back into RVR this morning, sitting up    Objective: Vitals:   10/27/20 0600 10/27/20 0700 10/27/20 0800 10/27/20 0900  BP: 103/70 (!) 90/55 98/61 (!) 98/54  Pulse: 94 85 (!) 102 (!) 114  Resp: 20 (!) 23 (!) 30 (!) 29  Temp:   99.4 F (37.4 C)   TempSrc:   Oral   SpO2: 97% 96% 98% 95%  Weight:      Height:        Physical Exam: AOx3, PERRL, EOMI, FS, TM, Strength 5/5 x4, SILTx4, no drift Incision c/d/I w/ some mild oozing on dressing  Assessment & Plan: 70 y.o. man s/p crani for tumor rsxn w/ intra-op RVR w/ hypotension, POD1 back in RVR w/ borderline hypotension.  -has prior notes from cards stating AV nodal blockade has worsened hypotension, will try dig load - rpt RFP, has no h/o CHF, rpt RFP tomorrow, targeting RVR not pump failure so no planned monitoring of levels if renal function is at baseline -prior TSH from months ago with abnormal levels, will repeat -keep in unit given RVR w/ MAPs in the mid 60s -will keep foley x24h to monitor UOP w/ hypotension, can d/c tomorrow -cont art line -SCDs/TEDs, SQH POD1  Judith Part  10/27/20 9:33 AM

## 2020-10-27 NOTE — Plan of Care (Signed)

## 2020-10-28 DIAGNOSIS — I4821 Permanent atrial fibrillation: Secondary | ICD-10-CM | POA: Diagnosis not present

## 2020-10-28 LAB — RENAL FUNCTION PANEL
Albumin: 1.8 g/dL — ABNORMAL LOW (ref 3.5–5.0)
Anion gap: 6 (ref 5–15)
BUN: 15 mg/dL (ref 8–23)
CO2: 21 mmol/L — ABNORMAL LOW (ref 22–32)
Calcium: 7.7 mg/dL — ABNORMAL LOW (ref 8.9–10.3)
Chloride: 106 mmol/L (ref 98–111)
Creatinine, Ser: 0.86 mg/dL (ref 0.61–1.24)
GFR, Estimated: >60 mL/min (ref >60)
Glucose, Bld: 151 mg/dL — ABNORMAL HIGH (ref 70–99)
Phosphorus: 2 mg/dL — ABNORMAL LOW (ref 2.5–4.6)
Potassium: 4.5 mmol/L (ref 3.5–5.1)
Sodium: 133 mmol/L — ABNORMAL LOW (ref 135–145)

## 2020-10-28 LAB — CALCIUM, IONIZED: Calcium, Ionized, Serum: 4.7 mg/dL (ref 4.5–5.6)

## 2020-10-28 MED ORDER — METOPROLOL TARTRATE 25 MG PO TABS
25.0000 mg | ORAL_TABLET | Freq: Two times a day (BID) | ORAL | Status: DC
  Administered 2020-10-28 – 2020-10-30 (×5): 25 mg via ORAL
  Filled 2020-10-28 (×5): qty 1

## 2020-10-28 MED ORDER — MENTHOL 3 MG MT LOZG
1.0000 | LOZENGE | OROMUCOSAL | Status: DC | PRN
  Administered 2020-10-28 – 2020-10-29 (×3): 3 mg via ORAL
  Filled 2020-10-28 (×2): qty 9

## 2020-10-28 NOTE — Progress Notes (Addendum)
Progress Note  Patient Name: Alan [name] Date of Encounter: 10/28/2020  [facility] Cardiologist: Carlyle Dolly, MD   Subjective   No CP or dyspnea; complains of HA  Inpatient Medications    Scheduled Meds:  Chlorhexidine Gluconate Cloth  6 each Topical Daily   dexamethasone (DECADRON) injection  4 mg Intravenous Q8H   digoxin  0.125 mg Oral Daily   levothyroxine  100 mcg Oral QAC breakfast   metoprolol tartrate  2.5 mg Intravenous Q6H   pantoprazole (PROTONIX) IV  40 mg Intravenous QHS   Continuous Infusions:  sodium chloride 75 mL/hr at 10/27/20 2009   levETIRAcetam 500 mg (10/28/20 0917)   PRN Meds: acetaminophen **OR** acetaminophen, ALPRAZolam, bisacodyl, HYDROcodone-acetaminophen, lidocaine, melatonin, morphine injection, ondansetron **OR** ondansetron (ZOFRAN) IV, promethazine   Vital Signs    Vitals:   10/28/20 0600 10/28/20 0700 10/28/20 0800 10/28/20 0915  BP: 127/78 126/81 120/80   Pulse: 91 89 96 91  Resp: 20 (!) 23 17   Temp:      TempSrc:      SpO2: 99% 93% 96%   Weight:      Height:        Intake/Output Summary (Last 24 hours) at 10/28/2020 0925 Last data filed at 10/28/2020 0900 Gross per 24 hour  Intake 2112.5 ml  Output 2490 ml  Net -377.5 ml   Last 3 Weights 10/26/2020 10/24/2020 10/15/2020  Weight (lbs) 151 lb 3.8 oz 151 lb 3.2 oz 152 lb 4 oz  Weight (kg) 68.6 kg 68.584 kg 69.06 kg      Telemetry    Atrial fibrillation; rate upper normal to mildly elevated - Personally Reviewed   GEN: No acute distress.  S/P craniotomy Neck: No JVD Cardiac: irregular Respiratory: Clear to auscultation bilaterally. GI: Soft, nontender, non-distended  MS: No edema Neuro:  Nonfocal  Psych: Normal affect   Labs    Chemistry Recent Labs  Lab 10/26/20 0930 10/27/20 1013 10/28/20 0524  NA 135 130* 133*  K 3.9 4.8 4.5  CL  --  103 106  CO2  --  19* 21*  GLUCOSE  --  218* 151*  BUN  --  19 15  CREATININE  --  0.85 0.86  CALCIUM  --   7.7* 7.7*  ALBUMIN  --  2.0* 1.8*  GFRNONAA  --  >60 >60  ANIONGAP  --  8 6     Hematology Recent Labs  Lab 10/26/20 0930  HGB 9.9*  HCT 29.0*    Radiology    MR BRAIN W WO CONTRAST  Result Date: 10/26/2020 CLINICAL DATA:  Brain/CNS neoplasm, assess treatment response EXAM: MRI HEAD WITHOUT AND WITH CONTRAST TECHNIQUE: Multiplanar, multiecho pulse sequences of the brain and surrounding structures were obtained without and with intravenous contrast. CONTRAST:  6.31m GADAVIST GADOBUTROL 1 MMOL/ML IV SOLN COMPARISON:  MRI head 10/12/2020. FINDINGS: Brain: Interval right frontal craniotomy and resection of the previously seen enhancing right frontal lesion. Expected hemorrhage and mild devitalized tissue along the periphery of the resection cavity. Small-to-moderate volume anti dependent pneumocephalus along the right frontal convexity with small volume extra-axial fluid/hemorrhage. Resulting mild mass effect on the adjacent frontal lobe. Right frontal lobe edema appears improved relative to prior. Mild (3-4 mm) leftward midline shift, slightly increased. No nodular or masslike enhancement at the resection site to suggest residual tumor. No enhancing lesions seen elsewhere. No acute infarct. No hydrocephalus. Trace susceptibility artifact and T2 hypointensity in bilateral occipital horns, compatible with trace intraventricular hemorrhage. Additional  mild for age scattered T2 hyperintensity in the white matter, nonspecific but compatible with chronic microvascular ischemic disease. Vascular: Major arterial flow voids are maintained the skull base. Skull and upper cervical spine: Right frontal craniotomy. Otherwise normal marrow signal. Sinuses/Orbits: Moderate scattered paranasal sinus mucosal thickening with air-fluid levels and frothy secretions in bilateral maxillary sinuses. No acute orbital findings. Other: Small left and trace right mastoid effusions. IMPRESSION: 1. Postoperative changes associated  with right frontal craniotomy and resection of a right frontal lobe enhancing lesion. Small to moderate pneumocephalus and small volume extra-axial fluid/hemorrhage along the right frontal convexity with overall decreased right frontal lobe edema and slightly increased mild (3-4 mm) leftward midline shift. No nodular/masslike enhancement to suggest residual tumor. 2. Trace intraventricular hemorrhage layering in the occipital horns bilaterally. 3. Moderate scattered paranasal sinus mucosal thickening with air-fluid levels and frothy secretions in bilateral maxillary sinuses. Electronically Signed   By: Margaretha Sheffield M.D.   On: 10/26/2020 17:07     Patient Profile     70 y.o. male with past medical history of metastatic lung cancer, hypertension, hyperlipidemia, coronary disease, permanent atrial fibrillation now status post craniotomy for resection of brain met for evaluation of atrial fibrillation.  Assessment & Plan    Permanent atrial fibrillation-patient remains in atrial fibrillation this morning.  Heart rate has improved.  He is now taking oral medications.  We will discontinue IV metoprolol and treat with 25 mg BID.  Patient's heart rate is elevated postoperatively likely secondary to the hyperadrenergic state related to recent craniotomy.  His heart rate should improve as he gets further away from his recent surgery. CHA2DS2-VASc 3.  Can resume apixaban later if okay with neurology.  On hold now status post craniotomy. Status postcraniotomy for resection of brain metastasis-Per neurosurgery. Metastatic lung cancer  For questions or updates, please contact [place] Please consult [email or web address] for contact info under        Signed, Kirk Ruths, MD  10/28/2020, 9:25 AM

## 2020-10-28 NOTE — Progress Notes (Signed)
Neurosurgery Service Progress Note  Subjective: No acute events overnight, HR / BP improved  Objective: Vitals:   10/28/20 0600 10/28/20 0700 10/28/20 0800 10/28/20 0915  BP: 127/78 126/81 120/80   Pulse: 91 89 96 91  Resp: 20 (!) 23 17   Temp:      TempSrc:      SpO2: 99% 93% 96%   Weight:      Height:        Physical Exam: AOx3, PERRL, EOMI, FS, TM, Strength 5/5 x4, SILTx4, no drift Incision c/d/I w/ some dried blood  Assessment & Plan: 70 y.o. man s/p crani for tumor rsxn w/ intra-op RVR w/ hypotension, POD1 back in RVR w/ borderline hypotension, POD2 improved.  -rate / BP improved, can transfer to stepdown -d/c dressing, can use half strength H2O2 to help with dried blood -KVO given improved BP / good renal function -d/c foley / A-line -SCDs/TEDs, SQH, holding anticoagulation for PAF given crani  Judith Part  10/28/20 9:22 AM

## 2020-10-28 NOTE — Plan of Care (Signed)
  Problem: Education: Goal: Knowledge of General Education information will improve Description: Including pain rating scale, medication(s)/side effects and non-pharmacologic comfort measures Outcome: Progressing   Problem: Health Behavior/Discharge Planning: Goal: Ability to manage health-related needs will improve Outcome: Progressing   Problem: Clinical Measurements: Goal: Ability to maintain clinical measurements within normal limits will improve Outcome: Progressing Goal: Will remain free from infection Outcome: Progressing Goal: Diagnostic test results will improve Outcome: Progressing   Problem: Activity: Goal: Risk for activity intolerance will decrease Outcome: Progressing   Problem: Elimination: Goal: Will not experience complications related to bowel motility Outcome: Progressing Goal: Will not experience complications related to urinary retention Outcome: Progressing   Problem: Pain Managment: Goal: General experience of comfort will improve Outcome: Progressing

## 2020-10-29 ENCOUNTER — Other Ambulatory Visit: Payer: Self-pay | Admitting: Radiation Therapy

## 2020-10-29 ENCOUNTER — Encounter (HOSPITAL_COMMUNITY): Payer: Self-pay | Admitting: Neurosurgery

## 2020-10-29 DIAGNOSIS — I482 Chronic atrial fibrillation, unspecified: Secondary | ICD-10-CM

## 2020-10-29 MED ORDER — POLYETHYLENE GLYCOL 3350 17 G PO PACK: 17.0000 g | PACK | Freq: Every day | ORAL | Status: DC

## 2020-10-29 MED ORDER — DEXTROMETHORPHAN POLISTIREX ER 30 MG/5ML PO SUER
30.0000 mg | Freq: Two times a day (BID) | ORAL | Status: DC | PRN
  Administered 2020-10-29 – 2020-10-30 (×2): 30 mg via ORAL
  Filled 2020-10-29 (×3): qty 5

## 2020-10-29 MED ORDER — POLYETHYLENE GLYCOL 3350 17 G PO PACK
17.0000 g | PACK | Freq: Every day | ORAL | Status: DC | PRN
  Administered 2020-10-29: 17 g via ORAL
  Filled 2020-10-29: qty 1

## 2020-10-29 MED ORDER — MAGNESIUM SULFATE 2 GM/50ML IV SOLN
2.0000 g | Freq: Once | INTRAVENOUS | Status: AC
  Administered 2020-10-29: 2 g via INTRAVENOUS
  Filled 2020-10-29: qty 50

## 2020-10-29 NOTE — Progress Notes (Signed)
  NEUROSURGERY PROGRESS NOTE   No issues overnight. No complaints this am. Remains in a-fib, no RVR. Has been off IV cardizem.  EXAM:  BP (!) 115/98   Pulse 83   Temp 98.7 F (37.1 C) (Oral)   Resp (!) 23   Ht _0  (1.651 m)   Wt 68.6 kg   SpO2 98%   BMI 25.17 kg/m   Awake, alert, oriented  Speech fluent, appropriate  CN grossly intact  5/5 BUE/BLE   IMPRESSION:  70 y.o. male POD# 3 s/p right craniotomy for resection of lung met, doing well from neurologic standpoint  Hx of Afib with RVR intraoperatively, now rate controlled with digoxin.  PLAN: - Will cont to monitor today. If remains rate controlled will plan on d/c tomorrow. - Cont to mobilize as tolerated - Cont Keppra, plan on steroid taper upon d/c   Consuella Lose, MD [facility]

## 2020-10-29 NOTE — Progress Notes (Signed)
                                                                                                                                                             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  Intent: Palliative  Radiation Treatment Dates: 10/18/2020 through 10/25/2020 Preop SRS Site Technique Total Dose (Gy) Dose per Fx (Gy) Completed Fx Beam Energies  Brain:  [id number] IMRT 27/27 9 3/3 6XFFF   Narrative: The patient tolerated radiation therapy relatively well. He will proceed with craniotomy tomorrow  Plan: The patient will receive a call in about one month from the radiation oncology department. He will continue follow up with Dr. Kathyrn Sheriff as well as Dr. Mickeal Skinner in surveillance and Dr. Delton Coombes in medical oncology.  ________________________________________________    Carola Rhine, PAC

## 2020-10-29 NOTE — Progress Notes (Signed)
   Progress Note  Patient Name: Alan [name] Date of Encounter: 10/29/2020  [facility] Cardiologist: Carlyle Dolly, MD   Subjective   No CP or dyspnea; complains of HA  Inpatient Medications    Scheduled Meds:  Chlorhexidine Gluconate Cloth  6 each Topical Daily   dexamethasone (DECADRON) injection  4 mg Intravenous Q8H   digoxin  0.125 mg Oral Daily   levothyroxine  100 mcg Oral QAC breakfast   metoprolol tartrate  25 mg Oral BID   pantoprazole (PROTONIX) IV  40 mg Intravenous QHS   Continuous Infusions:  sodium chloride Stopped (10/28/20 2249)   levETIRAcetam Stopped (10/28/20 2139)   PRN Meds: acetaminophen **OR** acetaminophen, ALPRAZolam, bisacodyl, HYDROcodone-acetaminophen, lidocaine, melatonin, menthol-cetylpyridinium, morphine injection, ondansetron **OR** ondansetron (ZOFRAN) IV, promethazine   Vital Signs    Vitals:   10/29/20 0400 10/29/20 0600 10/29/20 0737 10/29/20 0745  BP: (!) 114/53 (!) 147/100  (!) 115/98  Pulse: 87 94  83  Resp: (!) 22 (!) 33  (!) 23  Temp: 98.1 F (36.7 C)  98.7 F (37.1 C)   TempSrc: Oral  Oral   SpO2: 98% 96%  98%  Weight:      Height:        Intake/Output Summary (Last 24 hours) at 10/29/2020 0836 Last data filed at 10/29/2020 0000 Gross per 24 hour  Intake 498.51 ml  Output 200 ml  Net 298.51 ml   Last 3 Weights 10/26/2020 10/24/2020 10/15/2020  Weight (lbs) 151 lb 3.8 oz 151 lb 3.2 oz 152 lb 4 oz  Weight (kg) 68.6 kg 68.584 kg 69.06 kg      Telemetry    Afib rates 80[s  10/29/2020    GEN: No acute distress.  S/P craniotomy Neck: No JVD Cardiac: irregular Respiratory: Clear to auscultation bilaterally. GI: Soft, nontender, non-distended  MS: No edema Neuro:  Nonfocal  Psych: Normal affect   Labs    Chemistry Recent Labs  Lab 10/26/20 0930 10/27/20 1013 10/28/20 0524  NA 135 130* 133*  K 3.9 4.8 4.5  CL  --  103 106  CO2  --  19* 21*  GLUCOSE  --  218* 151*  BUN  --  19 15  CREATININE  --   0.85 0.86  CALCIUM  --  7.7* 7.7*  ALBUMIN  --  2.0* 1.8*  GFRNONAA  --  >60 >60  ANIONGAP  --  8 6     Hematology Recent Labs  Lab 10/26/20 0930  HGB 9.9*  HCT 29.0*    Radiology    No results found.   Patient Profile     70 y.o. male with past medical history of metastatic lung cancer, hypertension, hyperlipidemia, coronary disease, permanent atrial fibrillation now status post craniotomy for resection of brain met for evaluation of atrial fibrillation.  Assessment & Plan    Permanent atrial fibrillation-patient remains in atrial fibrillation this morning.  On digoxin and oral lopressor for rate control Anticoagulation hold for craniotomy  Status postcraniotomy for resection of brain metastasis-Per neurosurgery. Metastatic lung cancer  For questions or updates, please contact [place] Please consult [email or web address] for contact info under        Signed, Jenkins Rouge, MD  10/29/2020, 8:36 AM   Patient ID: [name], male   DOB: 1951-01-21, 70 y.o.   MRN: [id number]

## 2020-10-30 DIAGNOSIS — I4819 Other persistent atrial fibrillation: Secondary | ICD-10-CM | POA: Diagnosis not present

## 2020-10-30 LAB — BASIC METABOLIC PANEL
Anion gap: 8 (ref 5–15)
BUN: 21 mg/dL (ref 8–23)
CO2: 26 mmol/L (ref 22–32)
Calcium: 8 mg/dL — ABNORMAL LOW (ref 8.9–10.3)
Chloride: 97 mmol/L — ABNORMAL LOW (ref 98–111)
Creatinine, Ser: 0.84 mg/dL (ref 0.61–1.24)
GFR, Estimated: >60 mL/min (ref >60)
Glucose, Bld: 204 mg/dL — ABNORMAL HIGH (ref 70–99)
Potassium: 4.8 mmol/L (ref 3.5–5.1)
Sodium: 131 mmol/L — ABNORMAL LOW (ref 135–145)

## 2020-10-30 LAB — SURGICAL PATHOLOGY

## 2020-10-30 LAB — MAGNESIUM: Magnesium: 1.8 mg/dL (ref 1.7–2.4)

## 2020-10-30 MED ORDER — DIGOXIN 125 MCG PO TABS: 0.1250 mg | ORAL_TABLET | Freq: Every day | ORAL | 1 refills | Status: DC

## 2020-10-30 MED ORDER — LEVETIRACETAM 500 MG PO TABS: 500.0000 mg | ORAL_TABLET | Freq: Two times a day (BID) | ORAL | 0 refills | Status: DC

## 2020-10-30 MED ORDER — METHYLPREDNISOLONE 4 MG PO TBPK: ORAL_TABLET | ORAL | 0 refills | Status: DC

## 2020-10-30 MED ORDER — METOPROLOL TARTRATE 25 MG PO TABS
25.0000 mg | ORAL_TABLET | Freq: Three times a day (TID) | ORAL | Status: DC
  Administered 2020-10-30: 25 mg via ORAL
  Filled 2020-10-30: qty 1

## 2020-10-30 MED ORDER — DEXAMETHASONE 4 MG PO TABS
4.0000 mg | ORAL_TABLET | Freq: Three times a day (TID) | ORAL | Status: DC
  Administered 2020-10-30: 4 mg via ORAL
  Filled 2020-10-30: qty 1

## 2020-10-30 MED ORDER — METOPROLOL TARTRATE 25 MG PO TABS: 25.0000 mg | ORAL_TABLET | Freq: Three times a day (TID) | ORAL | 3 refills | Status: DC

## 2020-10-30 MED ORDER — MAGNESIUM SULFATE 2 GM/50ML IV SOLN
2.0000 g | Freq: Once | INTRAVENOUS | Status: AC
  Administered 2020-10-30: 2 g via INTRAVENOUS
  Filled 2020-10-30: qty 50

## 2020-10-30 NOTE — Progress Notes (Signed)
Progress Note  Patient Name: Alan [name] Date of Encounter: 10/30/2020  [facility] Cardiologist: Carlyle Dolly, MD   Subjective    70 yo with metastatic lung cancer  Has atrial fib We would like him to start back on his eliquis - waiting on neurosurgery to give the OK     Inpatient Medications    Scheduled Meds:  Chlorhexidine Gluconate Cloth  6 each Topical Daily   dexamethasone  4 mg Oral TID   digoxin  0.125 mg Oral Daily   levothyroxine  100 mcg Oral QAC breakfast   metoprolol tartrate  25 mg Oral BID   pantoprazole (PROTONIX) IV  40 mg Intravenous QHS   Continuous Infusions:  sodium chloride Stopped (10/29/20 2208)   levETIRAcetam 500 mg (10/30/20 0913)   PRN Meds: acetaminophen **OR** acetaminophen, ALPRAZolam, bisacodyl, dextromethorphan, HYDROcodone-acetaminophen, lidocaine, melatonin, menthol-cetylpyridinium, morphine injection, ondansetron **OR** ondansetron (ZOFRAN) IV, polyethylene glycol, promethazine   Vital Signs    Vitals:   10/30/20 0600 10/30/20 0731 10/30/20 0737 10/30/20 0800  BP: 111/70  135/83 127/82  Pulse: 93  100 (!) 109  Resp: 20  (!) 24 (!) 31  Temp:  98.1 F (36.7 C)    TempSrc:  Oral    SpO2: 91%  95% 94%  Weight:      Height:        Intake/Output Summary (Last 24 hours) at 10/30/2020 1011 Last data filed at 10/30/2020 0839 Gross per 24 hour  Intake 913.41 ml  Output 400 ml  Net 513.41 ml   Last 3 Weights 10/26/2020 10/24/2020 10/15/2020  Weight (lbs) 151 lb 3.8 oz 151 lb 3.2 oz 152 lb 4 oz  Weight (kg) 68.6 kg 68.584 kg 69.06 kg      Telemetry    Afib with RVR  - Personally Reviewed  ECG     - Personally Reviewed  Physical Exam   GEN: middle age man   Neck: No JVD Cardiac: rreg. Irreg.   [place]acy  Respiratory: bilat rhonchi  GI: Soft, nontender, non-distended  MS: No edema; No deformity. Neuro:  Nonfocal  Psych: Normal affect   Labs    High Sensitivity Troponin:  No results for input(s): TROPONINIHS in the  last 720 hours.    Chemistry Recent Labs  Lab 10/27/20 1013 10/28/20 0524 10/30/20 0429  NA 130* 133* 131*  K 4.8 4.5 4.8  CL 103 106 97*  CO2 19* 21* 26  GLUCOSE 218* 151* 204*  BUN 19 15 21   CREATININE 0.85 0.86 0.84  CALCIUM 7.7* 7.7* 8.0*  ALBUMIN 2.0* 1.8*  --   GFRNONAA >60 >60 >60  ANIONGAP 8 6 8      Hematology Recent Labs  Lab 10/26/20 0930  HGB 9.9*  HCT 29.0*    BNPNo results for input(s): BNP, PROBNP in the last 168 hours.   DDimer No results for input(s): DDIMER in the last 168 hours.   Radiology    No results found.  Cardiac Studies      Patient Profile     70 y.o. male  with persistent Afib   Assessment & Plan     Afib with RVR :   will continue with rate controlling meds.  HR is still a bit tachy. Cont metoprolol , will increase the dose to 25 mg TID .   Digoxin has been added.  He needs to be started back on his eliquis as soon as neurosurgery gives the OK       For questions  or updates, please contact [place] Please consult [email or web address] for contact info under        Signed, Mertie Moores, MD  10/30/2020, 10:11 AM

## 2020-10-30 NOTE — Discharge Instructions (Signed)
Please monitor your heart rates regularly and bring a log to your follow-up visit with Katina Dung, NP to discuss if additional medication changes need to occur to better control your atrial fibrillation.

## 2020-10-30 NOTE — Progress Notes (Signed)
  NEUROSURGERY PROGRESS NOTE   No issues overnight. Pt without c/o this am, denying HA, visual changes. No SOB, CP, or DIB  EXAM:  BP 127/82   Pulse (!) 109   Temp 98.1 F (36.7 C) (Oral)   Resp (!) 31   Ht 5\' 5"  (1.651 m)   Wt 68.6 kg   SpO2 94%   BMI 25.17 kg/m   Awake, alert, oriented  Speech fluent, appropriate  CN grossly intact  5/5 BUE/BLE   IMPRESSION:  69 y.o. male POD#4 s/p right frontal crani for resection of metastatic tumor. Doing well neurologically. A-fib with RVR better controlled on digoxin.  PLAN: - Stable for d/c from neurosurgical standpoint. Will d/w cardiology - Cont Keppra and steroid taper - Cont to mobilize as tolerated   Consuella Lose, MD [facility]

## 2020-10-30 NOTE — Discharge Summary (Signed)
Physician Discharge Summary  Patient ID: Alan [name] MRN: [id number] DOB/AGE: [birth year] 71 y.o.  Admit date: 10/26/2020 Discharge date: 10/30/2020  Admission Diagnoses:  Metastatic brain tumor Atrial Fibrillation  Discharge Diagnoses:  Same Active Problems:   Brain metastasis [facility]) Atrial Fibrillation  Discharged Condition: Stable  Hospital Course:  Alan [name] is a 70 y.o. male With a known history of metastatic lung cancer with previous diagnosis of a right frontal metastasis.  Patient previously underwent stereotactic radio surgery and was electively admitted for surgical resection of the lesion.  Intraoperatively, the patient was found to be in AFib with rapid ventricular response.  Rate was able to be controlled by the anesthesia service.  He was taken to the intensive care unit postoperatively in good neurologic condition with rate controlled with IV Cardizem.  He was transitioned to oral medications including digoxin and beta-blocker.  Was seen by the cardiology service.  His rate was well controlled.  He was tolerating diet, voiding normally, ambulating well, with minimal headache.  He was therefore discharged in stable condition.  Treatments: Surgery -   Stereotactic right frontal craniotomy for resection of tumor  Discharge Exam: Blood pressure (!) 127/93, pulse 92, temperature 98 F (36.7 C), temperature source Oral, resp. rate 20, height 5\' 5"  (1.651 m), weight 68.6 kg, SpO2 94 %. Awake, alert, oriented Speech fluent, appropriate CN grossly intact 5/5 BUE/BLE Wound c/d/i  Disposition: Discharge disposition: 01-Home or Self Care       Discharge Instructions     Call MD for:  redness, tenderness, or signs of infection (pain, swelling, redness, odor or green/yellow discharge around incision site)   Complete by: As directed    Call MD for:  temperature >100.4   Complete by: As directed    Diet - low sodium heart healthy   Complete by: As directed     Discharge instructions   Complete by: As directed    Walk at home as much as possible, at least 4 times / day   Increase activity slowly   Complete by: As directed    Lifting restrictions   Complete by: As directed    No lifting > 10 lbs   May shower / Bathe   Complete by: As directed    48 hours after surgery   May walk up steps   Complete by: As directed    No dressing needed   Complete by: As directed    Other Restrictions   Complete by: As directed    No bending/twisting at waist      Allergies as of 10/30/2020   No Known Allergies      Medication List     STOP taking these medications    dexamethasone 4 MG tablet Commonly known as: DECADRON       TAKE these medications    acetaminophen 500 MG tablet Commonly known as: TYLENOL Take 500 mg by mouth every 6 (six) hours as needed for moderate pain or headache.   ALPRAZolam 0.25 MG tablet Commonly known as: XANAX TAKE 1 TABLET BY MOUTH THREE TIMES DAILY AS NEEDED FOR ANXIETY   chlorproMAZINE 25 MG tablet Commonly known as: THORAZINE Take 1 tablet (25 mg total) by mouth every 6 (six) hours as needed.   dextromethorphan 30 MG/5ML liquid Commonly known as: DELSYM Take 30 mg by mouth as needed for cough.   digoxin 0.125 MG tablet Commonly known as: LANOXIN Take 1 tablet (0.125 mg total) by mouth daily. Start taking on: October 31, 2020   dronabinol 5 MG capsule Commonly known as: MARINOL Take 1 capsule (5 mg total) by mouth 2 (two) times daily before a meal.   Eliquis 5 MG Tabs tablet Generic drug: apixaban Take 1 tablet by mouth twice daily   IMFINZI IV Inject into the vein.   levETIRAcetam 500 MG tablet Commonly known as: Keppra Take 1 tablet (500 mg total) by mouth 2 (two) times daily.   levothyroxine 100 MCG tablet Commonly known as: Synthroid Take 1 tablet (100 mcg total) by mouth daily before breakfast.   lidocaine 2 % solution Commonly known as: XYLOCAINE Use as directed 15 mLs in  the mouth or throat as needed for mouth pain.   lidocaine-prilocaine cream Commonly known as: EMLA APPLY SMALL AMOUNT TO PORTA-CATH SITE AND COVER WITH PLASTIC WRAP 1 HOUR PRIOR TO CHEMO APPOINTMENTS.   Melatonin 10 MG Tabs Take 10 tablets by mouth at bedtime as needed (sleep).   methylPREDNISolone 4 MG Tbpk tablet Commonly known as: MEDROL DOSEPAK Take as directed on package   metoprolol tartrate 25 MG tablet Commonly known as: LOPRESSOR Take 1 tablet (25 mg total) by mouth 3 (three) times daily with meals. Take one tablet(25 mg )  in the AM and take 1/2 Tablet (12.5mg ) in the PM What changed:  how much to take how to take this when to take this               Discharge Care Instructions  (From admission, onward)           Start     Ordered   10/30/20 0000  No dressing needed        10/30/20 1549            Follow-up Information     Verta Ellen., NP Follow up on 11/09/2020.   Specialty: Cardiology Why: Please arrive 15 minutes early for your 11:30am post-hospital cardiology appointment. Please note, this is at our [place] as there were no available appointments at the [place] office in the time frame you needed to be seen. Sorry for the inconvenience. Contact information: [place] [address] [phone]         Consuella Lose, MD Follow up in 2 week(s).   Specialty: Neurosurgery Contact information: [address] [place] [address] [phone]                 Signed: Jairo Ben 10/30/2020, 3:50 PM

## 2020-11-05 ENCOUNTER — Inpatient Hospital Stay: Payer: Medicare Other | Attending: Radiation Oncology

## 2020-11-07 ENCOUNTER — Other Ambulatory Visit (HOSPITAL_COMMUNITY): Payer: Self-pay | Admitting: Hematology

## 2020-11-08 NOTE — Progress Notes (Signed)
Cardiology Office Note  Date: 11/09/2020   ID: Alan [name], Alan [name] September 08, 1950, MRN [id number]  PCP:  [facility]  Cardiologist:  Carlyle Dolly, MD Electrophysiologist:  None   Chief Complaint: Hospital follow up  History of Present Illness: Alan [name] is a 70 y.o. male with a history of Atrial fib RVR, Stage IV Lung CA, HLD, PAF, MI, HTN, hyperthyroidism.  Alan [name] was last seen by Dr Harl Bowie on 05/09/2020. Alan [name] had been experiencing recent high heart rates during a recent ER visit with fecal impaction. Heart rates in outpatient setting were in the 80's and 90's. AV nodal agents dosing was limited due to low blood pressures. Alan [name] was continuing current medication including anticoagulation. EKG in clinic atrial fibrillation rates of high 90's.   Alan [name] recent underwent right frontal craniotomy 10/30/2020 for resection of tumor for brain metastasis. During procedure Alan [name] was noted to be in atrial fibrillation with RVR. Alan [name] was started on IV Cardizem. Alan [name] was transitioned to oral medications including digoxin and beta blocker. Alan [name] was seen by cardiology. Alan [name] was discharged on digoxin 0.125 mg po daily, eliquis 5 mg po bid,  metoprolol 25 mg po tid.   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  His wife who is with him states Alan [name] had significant trouble maintaining his atrial fibrillation during hospital stay.  She states his metoprolol was adjusted up to 3 times daily along with an addition of digoxin 0.125 mg p.o. daily.  She states with the addition of extra doses of metoprolol and digoxin his atrial fibrillation was eventually controlled.  Alan [name] presents today with no particular complaints.  Heart rate today on arrival was 88.  Blood pressure is low at 98/54.  Alan [name] does complain of some occasional dizziness/balance issues.  Alan [name] also complains of some more fatigue.Marland Kitchen  His wife states Alan [name] is less active since the surgery and wants  to sit a lot.  His wife is unsure if symptoms are related to his recent surgery or blood pressure being on the low side.  Alan [name] had previously been on metoprolol 25 mg a.m. and 12.5 PM which had controlled his atrial fibrillation in the past prior to surgery.  Alan [name] denies any CVA or TIA-like symptoms.  No neurologic sequelae from craniotomy.  His wife states his lung cancer has worsened and Alan [name] is currently on chemo.  She states Alan [name] had previously been on immunotherapy but had to restart chemotherapy due to presence of new tumor activity.  She states the digoxin was started and plans were to take the medication until it ran out and continue on beta-blocker.    Past Medical History:  Diagnosis Date   Anxiety    Arthritis    Dysrhythmia    Hyperlipemia    Hypertension    Hyperthyroidism    Lung cancer ([place])    Myocardial infarction ([place])    Paroxysmal atrial fibrillation ([place])    Port-A-Cath in place 11/14/2019   Tachycardia     Past Surgical History:  Procedure Laterality Date   APPLICATION OF CRANIAL NAVIGATION N/A 10/26/2020   Procedure: APPLICATION OF CRANIAL NAVIGATION;  Surgeon: Consuella Lose, MD;  Location: [facility];  Service: Neurosurgery;  Laterality: N/A;  RM 21   CARDIAC CATHETERIZATION     CRANIOTOMY Right 10/26/2020   Procedure: Stereotactic Right frontal craniotomy for resection of tumor with sonopet;  Surgeon: Consuella Lose, MD;  Location: [facility];  Service:  Neurosurgery;  Laterality: Right;   PORTACATH PLACEMENT Right 11/01/2019   Procedure: INSERTION PORT-A-CATH;  Surgeon: Aviva Signs, MD;  Location: [facility];  Service: General;  Laterality: Right;    Current Outpatient Medications  Medication Sig Dispense Refill   acetaminophen (TYLENOL) 500 MG tablet Take 500 mg by mouth every 6 (six) hours as needed for moderate pain or headache.     ALPRAZolam (XANAX) 0.25 MG tablet TAKE 1 TABLET BY MOUTH THREE TIMES DAILY AS NEEDED FOR ANXIETY 30 tablet 0   chlorproMAZINE (THORAZINE) 25  MG tablet Take 1 tablet (25 mg total) by mouth every 6 (six) hours as needed. 30 tablet 0   dextromethorphan (DELSYM) 30 MG/5ML liquid Take 30 mg by mouth as needed for cough.     digoxin (LANOXIN) 0.125 MG tablet Take 1 tablet (0.125 mg total) by mouth daily. 30 tablet 1   dronabinol (MARINOL) 5 MG capsule Take 1 capsule (5 mg total) by mouth 2 (two) times daily before a meal. 60 capsule 2   Durvalumab (IMFINZI IV) Inject into the vein.     ELIQUIS 5 MG TABS tablet Take 1 tablet by mouth twice daily 60 tablet 5   levETIRAcetam (KEPPRA) 500 MG tablet Take 1 tablet (500 mg total) by mouth 2 (two) times daily. 60 tablet 0   levothyroxine (SYNTHROID) 100 MCG tablet Take 1 tablet (100 mcg total) by mouth daily before breakfast. 30 tablet 3   lidocaine (XYLOCAINE) 2 % solution TAKE 15 ML BY MOUTH 4 TIMES DAILY WITH MEALS 480 mL 0   lidocaine-prilocaine (EMLA) cream APPLY SMALL AMOUNT TO PORTA-CATH SITE AND COVER WITH PLASTIC WRAP 1 HOUR PRIOR TO CHEMO APPOINTMENTS. 30 g 6   Melatonin 10 MG TABS Take 10 tablets by mouth at bedtime as needed (sleep).     metoprolol tartrate (LOPRESSOR) 25 MG tablet Take 1 tablet (25 mg total) by mouth 3 (three) times daily with meals. Take one tablet(25 mg )  in the AM and take 1/2 Tablet (12.5mg ) in the PM 135 tablet 3   methylPREDNISolone (MEDROL DOSEPAK) 4 MG TBPK tablet Take as directed on package (Patient not taking: Reported on 11/09/2020) 1 each 0   No current facility-administered medications for this visit.   Allergies:  Patient has no known allergies.   Social History: The patient  reports that Alan [name] quit smoking about 6 years ago. His smoking use included cigars and cigarettes. Alan [name] has never used smokeless tobacco. Alan [name] reports that Alan [name] does not currently use alcohol. Alan [name] reports that Alan [name] does not use drugs.   Family History: The patient's family history includes Dementia in his father; Heart disease in his father; Heart failure in an other family member; Hypertension  in his father.   ROS:  Please see the history of present illness. Otherwise, complete review of systems is positive for none.  All other systems are reviewed and negative.   Physical Exam: VS:  BP (!) 98/54   Pulse 88   Ht 5\' 4"  (1.626 m)   Wt 148 lb (67.1 kg)   BMI 25.40 kg/m , BMI Body mass index is 25.4 kg/m.  Wt Readings from Last 3 Encounters:  11/09/20 148 lb (67.1 kg)  10/26/20 151 lb 3.8 oz (68.6 kg)  10/24/20 151 lb 3.2 oz (68.6 kg)    General: Patient appears comfortable at rest. Neck: Supple, no elevated JVP or carotid bruits, no thyromegaly. Lungs: Clear to auscultation, nonlabored breathing at rest. Cardiac: Irregularly irregular rate and rhythm, no S3  or significant systolic murmur, no pericardial rub. Abdomen: Soft, nontender, no hepatomegaly, bowel sounds present, no guarding or rebound. Extremities: No pitting edema, distal pulses 2+. Skin: Warm and dry. Musculoskeletal: No kyphosis. Neuropsychiatric: Alert and oriented x3, affect grossly appropriate.  ECG:  EKG October 27, 2020 atrial fibs/flutter with RVR rate of 176, possible right ventricular hypertrophy, ST and T wave abnormality, consider anterolateral ischemia  Recent Labwork: 10/03/2020: ALT 14; AST 17; Platelets 139 10/26/2020: Hemoglobin 9.9 10/27/2020: TSH 0.409 10/30/2020: BUN 21; Creatinine, Ser 0.84; Magnesium 1.8; Potassium 4.8; Sodium 131     Component Value Date/Time   CHOL 98 05/07/2020 1110   TRIG 77 05/07/2020 1110   HDL 33 (L) 05/07/2020 1110   CHOLHDL 3.0 05/07/2020 1110   VLDL 15 05/07/2020 1110   LDLCALC 50 05/07/2020 1110    Other Studies Reviewed Today:   NM PET Skull base to Thigh 09/20/2020 IMPRESSION: 1. The previous 5-10% left hydropneumothorax has increased to about 15% of left hemithoracic volume today. No findings of tension component. Continued left upper lobe volume loss and post radiation therapy findings. 2. Active hypermetabolic malignancy in the left AP window and  left hilum, along with a hypermetabolic 8 mm left lower lobe nodule favoring a metastatic lesion. Small but hypermetabolic lower paraesophageal lymph node. 3. Hypodensity in the right frontal lobe on the CT data, with some heterogeneous/increased activity in this vicinity on the PET data, raising the possibility of intracranial metastatic disease. MRI brain with and without contrast is recommended for further characterization. 4. Unusual appearance of the vocal cords which are no high in density and demonstrate hypermetabolic activity. Query laryngoplasty/vocal cord injection, versus interval calcification associated with inflammation. Has the patient had any procedure or changes in phonation? 5. Sub-centimeter focal hypermetabolic activity in the distal sigmoid colon, roughly stable from prior, suspicious for a small colonic polyp. 6. Aortic Atherosclerosis (ICD10-I70.0). Coronary atherosclerosis with mild cardiomegaly.    Echocardiogram 11/11/2019  1. Left ventricular ejection fraction, by estimation, is 65 to 70%. The left ventricle has normal function. The left ventricle has no regional wall motion abnormalities. There is moderate left ventricular hypertrophy. Left ventricular diastolic parameters are indeterminate. 2. Right ventricular systolic function is normal. The right ventricular size is normal. 3. A small pericardial effusion is present. The pericardial effusion is circumferential. There is no evidence of cardiac tamponade. 4. The mitral valve is normal in structure. Trivial mitral valve regurgitation. No evidence of mitral stenosis. 5. The aortic valve has an indeterminant number of cusps. Aortic valve regurgitation is not visualized. No aortic stenosis is present. 6. The inferior vena cava is normal in size with greater than 50% respiratory variability, suggesting right atrial pressure of 3 mmHg.  Assessment and Plan:  1. Atrial fibrillation, unspecified type ([place])    2. Brain metastasis ([place])   3. Malignant neoplasm of lung, unspecified laterality, unspecified part of lung ([place])   4. Essential hypertension    1. Atrial fibrillation, unspecified type [facility]) Alan [name] had recent issues with atrial fibrillation with RVR during neurosurgery for removal of metastatic tumor.  His wife states that Alan [name] had issues with attempting to control his atrial fibrillation rate.  During hospitalization his metoprolol dosage was increased as well as frequency.  Digoxin was added during hospital visit also.  Alan [name] is currently on metoprolol 25 mg p.o. 3 times daily and digoxin 0.125 mg daily.  His blood pressure is on the low side at 98/54.  Heart rate is controlled at 88.  States Alan [name]  feels a little unbalanced and may have some slight dizziness.  His wife states Alan [name] feels Alan [name] has less energy and sits around a lot.  Please decrease dose of metoprolol to 25 mg p.o. twice daily.  Continue digoxin 0.125 mg until supply runs out per wife's statement.  Patient's wife states the plan at discharge was to continue digoxin until it ran out.  Denies any bleeding on Eliquis.  2. Brain metastasis ([place]) Recent right craniotomy from metastatic brain tumor secondary to lung CA.  Alan [name] is doing well status post surgery which was 2 weeks ago.  Alan [name] is responding appropriately to questions.  Speech is appropriate.  Has no obvious neurologic sequelae from surgery.  3. Malignant neoplasm of lung, unspecified laterality, unspecified part of lung ([place]) Patient has a history of lung CAD.  Alan [name] had previously been on chemotherapy and was eventually started on immunotherapy.  There was discovery of new tumor activity and Alan [name] has recently been restarted on chemotherapy.  4. Essential hypertension Blood pressure is low today at 98/54.  We are decreasing metoprolol from 25 mg 3 times daily to 25 mg twice daily.  Hopefully as blood pressure will improve as well as his fatigue.   Medication Adjustments/Labs and Tests  Ordered: Current medicines are reviewed at length with the patient today.  Concerns regarding medicines are outlined above.   Disposition: Follow-up with Dr. Harl Bowie or APP 3 months  Signed, Levell July, NP 11/09/2020 11:35 AM    [facility], [place], [place] [address] Phone: [phone]; Fax: [phone]

## 2020-11-09 ENCOUNTER — Encounter: Payer: Self-pay | Admitting: Family Medicine

## 2020-11-09 ENCOUNTER — Ambulatory Visit (INDEPENDENT_AMBULATORY_CARE_PROVIDER_SITE_OTHER): Payer: Medicare Other | Admitting: Family Medicine

## 2020-11-09 VITALS — BP 98/54 | HR 88 | Ht 64.0 in | Wt 148.0 lb

## 2020-11-09 DIAGNOSIS — C3490 Malignant neoplasm of unspecified part of unspecified bronchus or lung: Secondary | ICD-10-CM

## 2020-11-09 DIAGNOSIS — I10 Essential (primary) hypertension: Secondary | ICD-10-CM

## 2020-11-09 DIAGNOSIS — I4891 Unspecified atrial fibrillation: Secondary | ICD-10-CM

## 2020-11-09 DIAGNOSIS — C7931 Secondary malignant neoplasm of brain: Secondary | ICD-10-CM

## 2020-11-09 MED ORDER — METOPROLOL TARTRATE 25 MG PO TABS: 25.0000 mg | ORAL_TABLET | Freq: Two times a day (BID) | ORAL | 3 refills | Status: AC

## 2020-11-09 NOTE — Patient Instructions (Signed)
Medication Instructions:   Decrease Lopressor to 25 mg Two Times Daily   *If you need a refill on your cardiac medications before your next appointment, please call your pharmacy*   Lab Work: NONE   If you have labs (blood work) drawn today and your tests are completely normal, you will receive your results only by: [place] (if you have MyChart) OR A paper copy in the mail If you have any lab test that is abnormal or we need to change your treatment, we will call you to review the results.   Testing/Procedures: NONE    Follow-Up: At [facility], you and your health needs are our priority.  As part of our continuing mission to provide you with exceptional heart care, we have created designated Provider Care Teams.  These Care Teams include your primary Cardiologist (physician) and Advanced Practice Providers (APPs -  Physician Assistants and Nurse Practitioners) who all work together to provide you with the care you need, when you need it.  We recommend signing up for the patient portal called "MyChart".  Sign up information is provided on this After Visit Summary.  MyChart is used to connect with patients for Virtual Visits (Telemedicine).  Patients are able to view lab/test results, encounter notes, upcoming appointments, etc.  Non-urgent messages can be sent to your provider as well.   To learn more about what you can do with MyChart, go to [email or web address].    Your next appointment:   3 month(s)  The format for your next appointment:   In Person  Provider:   You may see Carlyle Dolly, MD or the following Advanced Practice Provider on your designated Care Team:   Katina Dung, NP   Other Instructions Thank you for choosing [facility]!

## 2020-11-13 NOTE — Progress Notes (Signed)
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  PRIOR THERAPY: Concurrent chemoradiation with weekly carboplatin and paclitaxel x 6 cycles from 11/28/2019 to 01/02/2020  NGS Results: not done  CURRENT THERAPY: Taxotere and ramucirumab every 3 weeks  BRIEF ONCOLOGIC HISTORY:  Oncology History  Squamous cell lung cancer, left ([facility])  11/08/2019 Initial Diagnosis   Squamous cell lung cancer, left ([facility])   11/08/2019 Cancer Staging   Staging form: Lung, AJCC 8th Edition - Clinical stage from 11/08/2019: Stage IIIB (cT4, cN2, cM0) - Signed by Derek Jack, MD on 11/08/2019   11/28/2019 - 01/02/2020 Chemotherapy   The patient had palonosetron (ALOXI) injection 0.25 mg, 0.25 mg, Intravenous,  Once, 6 of 6 cycles Administration: 0.25 mg (11/28/2019), 0.25 mg (12/05/2019), 0.25 mg (12/12/2019), 0.25 mg (12/19/2019), 0.25 mg (12/26/2019), 0.25 mg (01/02/2020) CARBOplatin (PARAPLATIN) 180 mg in sodium chloride 0.9 % 250 mL chemo infusion, 180 mg (100 % of original dose 176 mg), Intravenous,  Once, 6 of 6 cycles Dose modification:   (original dose 176 mg, Cycle 1),   (original dose 201.2 mg, Cycle 2),   (original dose 179.2 mg, Cycle 3),   (original dose 179.2 mg, Cycle 4),   (original dose 195.4 mg, Cycle 5),   (original dose 178.2 mg, Cycle 6) Administration: 180 mg (11/28/2019), 200 mg (12/05/2019), 180 mg (12/12/2019), 180 mg (12/19/2019), 190 mg (12/26/2019), 180 mg (01/02/2020) PACLitaxel (TAXOL) 84 mg in sodium chloride 0.9 % 250 mL chemo infusion (</= 80mg /m2), 45 mg/m2 = 84 mg, Intravenous,  Once, 6 of 6 cycles Administration: 84 mg (11/28/2019), 84 mg (12/05/2019), 84 mg (12/12/2019), 84 mg (12/19/2019), 84 mg (12/26/2019), 84 mg (01/02/2020)   for chemotherapy treatment.      01/30/2020 - 09/11/2020 Chemotherapy          10/03/2020 -  Chemotherapy   Patient is on Treatment Plan : LUNG Docetaxel + Ramucirumab q21d        CANCER STAGING: Cancer Staging Squamous cell lung cancer, left ([facility]) Staging form: Lung, AJCC 8th Edition - Clinical stage from 11/08/2019: Stage IIIB (cT4, cN2, cM0) - Signed by Derek Jack, MD on 11/08/2019   INTERVAL HISTORY:  [name], a 70 y.o. male, returns for routine follow-up and consideration for next cycle of chemotherapy. Alan [name] was last seen on 10/03/2020.  Due for cycle #2 of Taxotere and ramucirumab today.   Overall, he tells me he has been feeling pretty well. His energy and activity levels are low, and he spends most of the day sleeping. He is no longer taking Decadron. He reports a cough which is occasionally productive of white sputum. He walks with the assistance of a cane, and he denies any recent falls. His appetite has been poor since being discharged from the hospital on 09/09 which has not been helped by Marinol. He denies tingling/numbness. He also reports poor sleep.   Overall, he feels ready for next cycle of chemo today.   REVIEW OF SYSTEMS:  Review of Systems  Constitutional:  Positive for appetite change (25%) and fatigue (depleted).  Respiratory:  Positive for cough and shortness of breath.   Neurological:  Positive for dizziness. Negative for numbness.  Psychiatric/Behavioral:  Positive for sleep disturbance.   All other systems reviewed and are negative.  PAST  MEDICAL/SURGICAL HISTORY:  Past Medical History:  Diagnosis Date   Anxiety    Arthritis    Dysrhythmia    Hyperlipemia    Hypertension    Hyperthyroidism    Lung cancer (HCC)    Myocardial infarction ([facility])    Paroxysmal atrial fibrillation ([facility])    Port-A-Cath in place 11/14/2019   Tachycardia    Past Surgical History:  Procedure Laterality Date   APPLICATION OF CRANIAL NAVIGATION N/A 10/26/2020   Procedure:  APPLICATION OF CRANIAL NAVIGATION;  Surgeon: Consuella Lose, MD;  Location: [facility];  Service: Neurosurgery;  Laterality: N/A;  RM 21   CARDIAC CATHETERIZATION     CRANIOTOMY Right 10/26/2020   Procedure: Stereotactic Right frontal craniotomy for resection of tumor with sonopet;  Surgeon: Consuella Lose, MD;  Location: [facility];  Service: Neurosurgery;  Laterality: Right;   PORTACATH PLACEMENT Right 11/01/2019   Procedure: INSERTION PORT-A-CATH;  Surgeon: Aviva Signs, MD;  Location: [facility];  Service: General;  Laterality: Right;    SOCIAL HISTORY:  Social History   Socioeconomic History   Marital status: Married    Spouse name: Not on file   Number of children: 1   Years of education: Not on file   Highest education level: Not on file  Occupational History   Occupation: retired   Occupation: Glass blower/designer: [employer]  Tobacco Use   Smoking status: Former    Years: 45.00    Types: Cigars, Cigarettes    Quit date: 10/19/2014    Years since quitting: 6.0   Smokeless tobacco: Never  Vaping Use   Vaping Use: Never used  Substance and Sexual Activity   Alcohol use: Not Currently    Comment: occ   Drug use: No   Sexual activity: Not on file  Other Topics Concern   Not on file  Social History Narrative   Not on file   Social Determinants of Health   Financial Resource Strain: Not on file  Food Insecurity: Not on file  Transportation Needs: Not on file  Physical Activity: Not on file  Stress: Not on file  Social Connections: Not on file  Intimate Partner Violence: Not on file    FAMILY HISTORY:  Family History  Problem Relation Age of Onset   Hypertension Father    Dementia Father        died age 76   Heart disease Father    Heart failure Other     CURRENT MEDICATIONS:  Current Outpatient Medications  Medication Sig Dispense Refill   dextromethorphan (DELSYM) 30 MG/5ML liquid Take 30 mg by mouth as needed for cough.     digoxin (LANOXIN) 0.125 MG tablet  Take 1 tablet (0.125 mg total) by mouth daily. 30 tablet 1   Durvalumab (IMFINZI IV) Inject into the vein.     ELIQUIS 5 MG TABS tablet Take 1 tablet by mouth twice daily 60 tablet 5   levETIRAcetam (KEPPRA) 500 MG tablet Take 1 tablet (500 mg total) by mouth 2 (two) times daily. 60 tablet 0   levothyroxine (SYNTHROID) 100 MCG tablet Take 1 tablet (100 mcg total) by mouth daily before breakfast. 30 tablet 3   lidocaine (XYLOCAINE) 2 % solution TAKE 15 ML BY MOUTH 4 TIMES DAILY WITH MEALS 480 mL 0   lidocaine-prilocaine (EMLA) cream APPLY SMALL AMOUNT TO PORTA-CATH SITE AND COVER WITH PLASTIC WRAP 1 HOUR PRIOR TO CHEMO APPOINTMENTS. 30 g 6   megestrol (MEGACE) 400 MG/10ML suspension Take 10 mLs (400  mg total) by mouth 2 (two) times daily. 480 mL 1   methylPREDNISolone (MEDROL DOSEPAK) 4 MG TBPK tablet Take as directed on package 1 each 0   metoprolol tartrate (LOPRESSOR) 25 MG tablet Take 1 tablet (25 mg total) by mouth 2 (two) times daily. 180 tablet 3   acetaminophen (TYLENOL) 500 MG tablet Take 500 mg by mouth every 6 (six) hours as needed for moderate pain or headache. (Patient not taking: Reported on 11/14/2020)     ALPRAZolam (XANAX) 0.25 MG tablet TAKE 1 TABLET BY MOUTH THREE TIMES DAILY AS NEEDED FOR ANXIETY (Patient not taking: Reported on 11/14/2020) 30 tablet 0   chlorproMAZINE (THORAZINE) 25 MG tablet Take 1 tablet (25 mg total) by mouth every 6 (six) hours as needed. (Patient not taking: Reported on 11/14/2020) 30 tablet 0   Melatonin 10 MG TABS Take 10 tablets by mouth at bedtime as needed (sleep). (Patient not taking: Reported on 11/14/2020)     No current facility-administered medications for this visit.    ALLERGIES:  No Known Allergies  PHYSICAL EXAM:  Performance status (ECOG): 1 - Symptomatic but completely ambulatory  There were no vitals filed for this visit. Wt Readings from Last 3 Encounters:  11/09/20 148 lb (67.1 kg)  10/26/20 151 lb 3.8 oz (68.6 kg)  10/24/20 151 lb  3.2 oz (68.6 kg)   Physical Exam Vitals reviewed.  Constitutional:      Appearance: Normal appearance.     Comments: In wheelchair  Cardiovascular:     Rate and Rhythm: Normal rate and regular rhythm.     Pulses: Normal pulses.     Heart sounds: Normal heart sounds.  Pulmonary:     Effort: Pulmonary effort is normal.     Breath sounds: Normal breath sounds.  Neurological:     General: No focal deficit present.     Mental Status: He is alert and oriented to person, place, and time.  Psychiatric:        Mood and Affect: Mood normal.        Behavior: Behavior normal.    LABORATORY DATA:  I have reviewed the labs as listed.  CBC Latest Ref Rng & Units 11/14/2020 10/26/2020 10/03/2020  WBC 4.0 - 10.5 K/uL 9.4 - 8.7  Hemoglobin 13.0 - 17.0 g/dL 12.2(L) 9.9(L) 13.8  Hematocrit 39.0 - 52.0 % 36.6(L) 29.0(L) 40.9  Platelets 150 - 400 K/uL 221 - 139(L)   CMP Latest Ref Rng & Units 11/14/2020 10/30/2020 10/28/2020  Glucose 70 - 99 mg/dL 196(H) 204(H) 151(H)  BUN 8 - 23 mg/dL 18 21 15   Creatinine 0.61 - 1.24 mg/dL 1.10 0.84 0.86  Sodium 135 - 145 mmol/L 131(L) 131(L) 133(L)  Potassium 3.5 - 5.1 mmol/L 3.7 4.8 4.5  Chloride 98 - 111 mmol/L 97(L) 97(L) 106  CO2 22 - 32 mmol/L 19(L) 26 21(L)  Calcium 8.9 - 10.3 mg/dL 8.4(L) 8.0(L) 7.7(L)  Total Protein 6.5 - 8.1 g/dL 6.6 - -  Total Bilirubin 0.3 - 1.2 mg/dL 0.9 - -  Alkaline Phos 38 - 126 U/L 75 - -  AST 15 - 41 U/L 30 - -  ALT 0 - 44 U/L 18 - -    DIAGNOSTIC IMAGING:  I have independently reviewed the scans and discussed with the patient. MR BRAIN W WO CONTRAST  Result Date: 10/26/2020 CLINICAL DATA:  Brain/CNS neoplasm, assess treatment response EXAM: MRI HEAD WITHOUT AND WITH CONTRAST TECHNIQUE: Multiplanar, multiecho pulse sequences of the brain and surrounding structures were obtained  without and with intravenous contrast. CONTRAST:  6.66mL GADAVIST GADOBUTROL 1 MMOL/ML IV SOLN COMPARISON:  MRI head 10/12/2020. FINDINGS: Brain:  Interval right frontal craniotomy and resection of the previously seen enhancing right frontal lesion. Expected hemorrhage and mild devitalized tissue along the periphery of the resection cavity. Small-to-moderate volume anti dependent pneumocephalus along the right frontal convexity with small volume extra-axial fluid/hemorrhage. Resulting mild mass effect on the adjacent frontal lobe. Right frontal lobe edema appears improved relative to prior. Mild (3-4 mm) leftward midline shift, slightly increased. No nodular or masslike enhancement at the resection site to suggest residual tumor. No enhancing lesions seen elsewhere. No acute infarct. No hydrocephalus. Trace susceptibility artifact and T2 hypointensity in bilateral occipital horns, compatible with trace intraventricular hemorrhage. Additional mild for age scattered T2 hyperintensity in the white matter, nonspecific but compatible with chronic microvascular ischemic disease. Vascular: Major arterial flow voids are maintained the skull base. Skull and upper cervical spine: Right frontal craniotomy. Otherwise normal marrow signal. Sinuses/Orbits: Moderate scattered paranasal sinus mucosal thickening with air-fluid levels and frothy secretions in bilateral maxillary sinuses. No acute orbital findings. Other: Small left and trace right mastoid effusions. IMPRESSION: 1. Postoperative changes associated with right frontal craniotomy and resection of a right frontal lobe enhancing lesion. Small to moderate pneumocephalus and small volume extra-axial fluid/hemorrhage along the right frontal convexity with overall decreased right frontal lobe edema and slightly increased mild (3-4 mm) leftward midline shift. No nodular/masslike enhancement to suggest residual tumor. 2. Trace intraventricular hemorrhage layering in the occipital horns bilaterally. 3. Moderate scattered paranasal sinus mucosal thickening with air-fluid levels and frothy secretions in bilateral maxillary  sinuses. Electronically Signed   By: Margaretha Sheffield M.D.   On: 10/26/2020 17:07     ASSESSMENT:  1.  Left lung cancer: -Presentation to the ER on 10/06/2019 with cough and dizziness. -CT chest with contrast on 10/06/2019 showed large left suprahilar mass completely obstructing left upper lobe bronchus, measuring 7 x 5.3 x 5.8 cm.  Mass extends into AP window and narrows the left mainstem bronchus.  Small subpleural nodule in the left lower lobe measuring 4 mm indeterminate.  AP window nodal mass measuring 3.4 cm.  No supraclavicular adenopathy.  Subcarinal enlarged lymph node measures 1.4 cm. -PET scan on 10/14/2019 shows large hypermetabolic left upper lobe mass surrounding the left hilum, extending into pleural surface not invading the chest wall.  Direct invasion into the AP window and metastatic subcarinal lymph node, T4 N2 M0.  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 -10 pound weight loss in the last 6 months.  Hoarseness for last 1 week. -MRI of the brain on 10/28/2019 with no evidence of metastasis. -Concurrent chemoradiation therapy started on 11/28/2019. Completed 01/02/20 ( chemo) 01/06/2020(radiation) -CT chest on 01/20/2020 showed significant interval decrease in consolidation of the left upper lobe, interval decrease in irregular nodularity and consolidation of the left lower lobe.  Unchanged posttreatment appearance of soft tissue about the left hilum and AP window.  No discretely enlarged mediastinal or hilar lymph nodes.  Trace left pleural effusion, decreased compared to prior exam. -Consolidation durvalumab started on 01/30/2020. -CT chest with contrast on 03/30/2020 with the development of small left-sided pleural effusion with further volume loss.  Bandlike scarring in the left lower lobe since prior study likely posttreatment changes.  Small bone lesion at T1 stable.  1 small nodule in the right chest which is new about 4 mm.   Will monitor.  2.  Social/family history: -He smoked 1 pack/day for 45 years, quit 5 years ago. -No family history of malignancies.   PLAN:  1.  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 - This was followed by surgery for his frontal lobe metastatic disease. - He was discharged from the hospital about 2 weeks ago.  His activity levels are low.  Appetite is also low.  Tingling and numbness in the extremities has been stable. - Reviewed his labs today.  LFTs are normal although albumin is low at 2.6.  CBC was grossly normal. - We will make a referral for PT evaluation and treatment. - We will hold treatment today.  Reevaluate in 2 weeks.  If his functional status improves, will resume chemotherapy.  We will likely add Cyramza at that time.     2.  Elevated blood sugar: -Glucose is 196 today.  He was recently given Medrol Dosepak by neurosurgery at the time of discharge on 10/30/2020. - We will check hemoglobin A1c today.   3.  Atrial fibrillation: -Continue Eliquis 5 mg twice daily.  No bleeding issues.   4.  Weight loss: -Marinol is no longer working.  We will start him on Megace 400 mg twice daily as his eating has been decreased.   5.  Hypertension: -Continue metoprolol.   6.  Hypothyroidism: -Continue Synthroid 100 mcg daily.  TSH today is 1.35.  7.  Cough: -Continue hydrocodone syrup.  8.  Right frontal lobe mass: -Stereotactic right frontal craniotomy for resection of tumor on 10/26/2020 - He is completely off of dexamethasone.     Orders placed this encounter:  No orders of the defined types were placed in this encounter.    Derek Jack, MD [place] [phone]   I, Thana Ates, am acting as a scribe for Dr. Derek Jack.  I, Derek Jack MD, have reviewed the above documentation for accuracy and completeness, and I agree with the  above.

## 2020-11-14 ENCOUNTER — Inpatient Hospital Stay (HOSPITAL_COMMUNITY): Payer: Medicare Other

## 2020-11-14 ENCOUNTER — Other Ambulatory Visit: Payer: Self-pay

## 2020-11-14 ENCOUNTER — Inpatient Hospital Stay (HOSPITAL_COMMUNITY): Payer: Medicare Other | Attending: Hematology and Oncology | Admitting: Hematology

## 2020-11-14 DIAGNOSIS — I252 Old myocardial infarction: Secondary | ICD-10-CM | POA: Diagnosis not present

## 2020-11-14 DIAGNOSIS — Z8249 Family history of ischemic heart disease and other diseases of the circulatory system: Secondary | ICD-10-CM | POA: Insufficient documentation

## 2020-11-14 DIAGNOSIS — G9389 Other specified disorders of brain: Secondary | ICD-10-CM | POA: Diagnosis not present

## 2020-11-14 DIAGNOSIS — Z818 Family history of other mental and behavioral disorders: Secondary | ICD-10-CM | POA: Insufficient documentation

## 2020-11-14 DIAGNOSIS — C7931 Secondary malignant neoplasm of brain: Secondary | ICD-10-CM | POA: Diagnosis not present

## 2020-11-14 DIAGNOSIS — Z79899 Other long term (current) drug therapy: Secondary | ICD-10-CM | POA: Diagnosis not present

## 2020-11-14 DIAGNOSIS — Z95828 Presence of other vascular implants and grafts: Secondary | ICD-10-CM

## 2020-11-14 DIAGNOSIS — C3492 Malignant neoplasm of unspecified part of left bronchus or lung: Secondary | ICD-10-CM

## 2020-11-14 DIAGNOSIS — R0602 Shortness of breath: Secondary | ICD-10-CM | POA: Diagnosis not present

## 2020-11-14 DIAGNOSIS — R5383 Other fatigue: Secondary | ICD-10-CM | POA: Insufficient documentation

## 2020-11-14 DIAGNOSIS — G479 Sleep disorder, unspecified: Secondary | ICD-10-CM | POA: Diagnosis not present

## 2020-11-14 DIAGNOSIS — R739 Hyperglycemia, unspecified: Secondary | ICD-10-CM | POA: Diagnosis not present

## 2020-11-14 DIAGNOSIS — J3801 Paralysis of vocal cords and larynx, unilateral: Secondary | ICD-10-CM | POA: Insufficient documentation

## 2020-11-14 DIAGNOSIS — Z7901 Long term (current) use of anticoagulants: Secondary | ICD-10-CM | POA: Insufficient documentation

## 2020-11-14 DIAGNOSIS — C3412 Malignant neoplasm of upper lobe, left bronchus or lung: Secondary | ICD-10-CM | POA: Diagnosis present

## 2020-11-14 DIAGNOSIS — R059 Cough, unspecified: Secondary | ICD-10-CM | POA: Diagnosis not present

## 2020-11-14 DIAGNOSIS — R42 Dizziness and giddiness: Secondary | ICD-10-CM | POA: Insufficient documentation

## 2020-11-14 DIAGNOSIS — J984 Other disorders of lung: Secondary | ICD-10-CM | POA: Diagnosis not present

## 2020-11-14 LAB — COMPREHENSIVE METABOLIC PANEL
ALT: 18 U/L (ref 0–44)
AST: 30 U/L (ref 15–41)
Albumin: 2.6 g/dL — ABNORMAL LOW (ref 3.5–5.0)
Alkaline Phosphatase: 75 U/L (ref 38–126)
Anion gap: 15 (ref 5–15)
BUN: 18 mg/dL (ref 8–23)
CO2: 19 mmol/L — ABNORMAL LOW (ref 22–32)
Calcium: 8.4 mg/dL — ABNORMAL LOW (ref 8.9–10.3)
Chloride: 97 mmol/L — ABNORMAL LOW (ref 98–111)
Creatinine, Ser: 1.1 mg/dL (ref 0.61–1.24)
GFR, Estimated: >60 mL/min (ref >60)
Glucose, Bld: 196 mg/dL — ABNORMAL HIGH (ref 70–99)
Potassium: 3.7 mmol/L (ref 3.5–5.1)
Sodium: 131 mmol/L — ABNORMAL LOW (ref 135–145)
Total Bilirubin: 0.9 mg/dL (ref 0.3–1.2)
Total Protein: 6.6 g/dL (ref 6.5–8.1)

## 2020-11-14 LAB — CBC WITH DIFFERENTIAL/PLATELET
Abs Immature Granulocytes: 0.17 10*3/uL — ABNORMAL HIGH (ref 0.00–0.07)
Basophils Absolute: 0.1 10*3/uL (ref 0.0–0.1)
Basophils Relative: 1 %
Eosinophils Absolute: 0 10*3/uL (ref 0.0–0.5)
Eosinophils Relative: 0 %
HCT: 36.6 % — ABNORMAL LOW (ref 39.0–52.0)
Hemoglobin: 12.2 g/dL — ABNORMAL LOW (ref 13.0–17.0)
Immature Granulocytes: 2 %
Lymphocytes Relative: 5 %
Lymphs Abs: 0.4 10*3/uL — ABNORMAL LOW (ref 0.7–4.0)
MCH: 30.9 pg (ref 26.0–34.0)
MCHC: 33.3 g/dL (ref 30.0–36.0)
MCV: 92.7 fL (ref 80.0–100.0)
Monocytes Absolute: 0.4 10*3/uL (ref 0.1–1.0)
Monocytes Relative: 4 %
Neutro Abs: 8.3 10*3/uL — ABNORMAL HIGH (ref 1.7–7.7)
Neutrophils Relative %: 88 %
Platelets: 221 10*3/uL (ref 150–400)
RBC: 3.95 MIL/uL — ABNORMAL LOW (ref 4.22–5.81)
RDW: 13.9 % (ref 11.5–15.5)
WBC: 9.4 10*3/uL (ref 4.0–10.5)
nRBC: 0 % (ref 0.0–0.2)

## 2020-11-14 LAB — TSH: TSH: 1.355 u[IU]/mL (ref 0.350–4.500)

## 2020-11-14 LAB — MAGNESIUM: Magnesium: 1.7 mg/dL (ref 1.7–2.4)

## 2020-11-14 MED ORDER — SODIUM CHLORIDE 0.9% FLUSH
10.0000 mL | Freq: Once | INTRAVENOUS | Status: AC
  Administered 2020-11-14: 10 mL via INTRAVENOUS

## 2020-11-14 MED ORDER — HEPARIN SOD (PORK) LOCK FLUSH 100 UNIT/ML IV SOLN
500.0000 [IU] | Freq: Once | INTRAVENOUS | Status: AC
  Administered 2020-11-14: 500 [IU] via INTRAVENOUS

## 2020-11-14 MED ORDER — MEGESTROL ACETATE 400 MG/10ML PO SUSP: 400.0000 mg | Freq: Two times a day (BID) | ORAL | 1 refills | Status: AC

## 2020-11-14 NOTE — Patient Instructions (Addendum)
[place] at [facility] Discharge Instructions  You were seen today by Dr. Delton Coombes. He went over your recent results. You have been prescribed Megace to improve your appetite: take 2 teaspoons twice a day. You will be referred to physical therapy for an evaluation. Dr. Delton Coombes will see you back in 2 weeks for labs and follow up.   Thank you for choosing [place] at [facility] to provide your oncology and hematology care.  To afford each patient quality time with our provider, please arrive at least 15 minutes before your scheduled appointment time.   If you have a lab appointment with the [facility] please come in thru the Main Entrance and check in at the main information desk  You need to re-schedule your appointment should you arrive 10 or more minutes late.  We strive to give you quality time with our providers, and arriving late affects you and other patients whose appointments are after yours.  Also, if you no show three or more times for appointments you may be dismissed from the clinic at the providers discretion.     Again, thank you for choosing [facility].  Our hope is that these requests will decrease the amount of time that you wait before being seen by our physicians.       _____________________________________________________________  Should you have questions after your visit to [facility], please contact our office at [phone] between the hours of 8:00 a.m. and 4:30 p.m.  Voicemails left after 4:00 p.m. will not be returned until the following business day.  For prescription refill requests, have your pharmacy contact our office and allow 72 hours.    Cancer Center Support Programs:   > Cancer Support Group  2nd Tuesday of the month 1pm-2pm, Journey Room

## 2020-11-14 NOTE — Progress Notes (Signed)
Patients port flushed without difficulty.  Good blood return noted with no bruising or swelling noted at site.  Stable during access and blood draw.  Patient to remain accessed for treatment. 

## 2020-11-14 NOTE — Progress Notes (Signed)
No treatment today per Dr.Katragadda.

## 2020-11-15 LAB — HEMOGLOBIN A1C
Hgb A1c MFr Bld: 6.6 % — ABNORMAL HIGH (ref 4.8–5.6)
Mean Plasma Glucose: 143 mg/dL

## 2020-11-15 LAB — T4: T4, Total: 10.7 ug/dL (ref 4.5–12.0)

## 2020-11-26 ENCOUNTER — Inpatient Hospital Stay: Payer: Medicare Other | Admitting: Internal Medicine

## 2020-11-26 ENCOUNTER — Ambulatory Visit
Admission: RE | Admit: 2020-11-26 | Discharge: 2020-11-26 | Disposition: A | Discharge status: Home / Self Care | Payer: Medicare Other | Source: Ambulatory Visit | Attending: Radiation Oncology | Admitting: Radiation Oncology

## 2020-11-26 DIAGNOSIS — C3492 Malignant neoplasm of unspecified part of left bronchus or lung: Secondary | ICD-10-CM | POA: Insufficient documentation

## 2020-11-26 DIAGNOSIS — C7931 Secondary malignant neoplasm of brain: Secondary | ICD-10-CM | POA: Insufficient documentation

## 2020-11-26 NOTE — Progress Notes (Signed)
  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  Interval Since Last Radiation:  5 weeks   10/18/2020 through 10/25/2020 Preop SRS Site Technique Total Dose (Gy) Dose per Fx (Gy) Completed Fx Beam Energies  Brain:  [id number] IMRT 27/27 9 3/3 6XFFF    Narrative:  The patient was contacted today for routine follow-up. During treatment he did very well with radiotherapy and did not have significant desquamation.    Impression/Plan: 1. 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 Kathyrn Sheriff as well as Dr. Mickeal Skinner in surveillance and Dr. Delton Coombes in medical oncology.      Carola Rhine, PAC

## 2020-11-27 ENCOUNTER — Other Ambulatory Visit: Payer: Self-pay

## 2020-11-27 ENCOUNTER — Inpatient Hospital Stay: Payer: Medicare Other | Attending: Internal Medicine | Admitting: Internal Medicine

## 2020-11-27 VITALS — BP 128/80 | HR 88 | Temp 96.0°F | Resp 18 | Wt 140.5 lb

## 2020-11-27 DIAGNOSIS — C3492 Malignant neoplasm of unspecified part of left bronchus or lung: Secondary | ICD-10-CM | POA: Insufficient documentation

## 2020-11-27 DIAGNOSIS — C7931 Secondary malignant neoplasm of brain: Secondary | ICD-10-CM | POA: Diagnosis not present

## 2020-11-27 NOTE — Progress Notes (Signed)
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  PRIOR THERAPY: Concurrent chemoradiation with weekly carboplatin and paclitaxel x 6 cycles from 11/28/2019 to 01/02/2020  NGS Results: not done  CURRENT THERAPY: Taxotere and ramucirumab every 3 weeks  BRIEF ONCOLOGIC HISTORY:  Oncology History  Squamous cell lung cancer, left ([facility])  11/08/2019 Initial Diagnosis   Squamous cell lung cancer, left ([place])   11/08/2019 Cancer Staging   Staging form: Lung, AJCC 8th Edition - Clinical stage from 11/08/2019: Stage IIIB (cT4, cN2, cM0) - Signed by Derek Jack, MD on 11/08/2019   11/28/2019 - 01/02/2020 Chemotherapy   The patient had palonosetron (ALOXI) injection 0.25 mg, 0.25 mg, Intravenous,  Once, 6 of 6 cycles Administration: 0.25 mg (11/28/2019), 0.25 mg (12/05/2019), 0.25 mg (12/12/2019), 0.25 mg (12/19/2019), 0.25 mg (12/26/2019), 0.25 mg (01/02/2020) CARBOplatin (PARAPLATIN) 180 mg in sodium chloride 0.9 % 250 mL chemo infusion, 180 mg (100 % of original dose 176 mg), Intravenous,  Once, 6 of 6 cycles Dose modification:   (original dose 176 mg, Cycle 1),   (original dose 201.2 mg, Cycle 2),   (original dose 179.2 mg, Cycle 3),   (original dose 179.2 mg, Cycle 4),   (original dose 195.4 mg, Cycle 5),   (original dose 178.2 mg, Cycle 6) Administration: 180 mg (11/28/2019), 200 mg (12/05/2019), 180 mg (12/12/2019), 180 mg (12/19/2019), 190 mg (12/26/2019), 180 mg (01/02/2020) PACLitaxel (TAXOL) 84 mg in sodium chloride 0.9 % 250 mL chemo infusion (</= 80mg /m2), 45 mg/m2 = 84 mg, Intravenous,  Once, 6 of 6 cycles Administration: 84 mg (11/28/2019), 84 mg (12/05/2019), 84 mg (12/12/2019), 84 mg (12/19/2019), 84 mg (12/26/2019), 84 mg (01/02/2020)   for chemotherapy treatment.      01/30/2020 - 09/11/2020 Chemotherapy          10/03/2020 -  Chemotherapy   Patient is on Treatment Plan : LUNG Docetaxel + Ramucirumab q21d        CANCER STAGING: Cancer Staging Squamous cell lung cancer, left ([facility]) Staging form: Lung, AJCC 8th Edition - Clinical stage from 11/08/2019: Stage IIIB (cT4, cN2, cM0) - Signed by Derek Jack, MD on 11/08/2019   INTERVAL HISTORY:  Alan [name], a 70 y.o. male, returns for routine follow-up and consideration for next cycle of chemotherapy. Alan [name] was last seen on 11/14/2020.  Due for cycle #2 of Taxotere and ramucirumab today.   Overall, he tells me he has been feeling pretty well. He has no appetite which has not been helped by taking Megace for the past 10 days, and his fatigue has increased. He has not fallen, but he stumbles while walking. He reports congestion and cough occasional productive of a small amount of sputum. He has lost 8 pounds since 9/23. He is drinking Boost occasionally. He denies headaches, nausea, and vomiting. He spends most of the day sleeping, and he is awake for about 2 hours of the day.   Overall, he is not ready for next cycle of chemo today.   REVIEW OF SYSTEMS:  Review of Systems  Constitutional:  Positive for appetite change (depleted), fatigue (25%) and unexpected weight change (-8 lbs).  Respiratory:  Positive for cough and shortness of breath.   Gastrointestinal:  Negative for nausea and vomiting.  Musculoskeletal:  Positive for gait problem (  stumbling).  Neurological:  Positive for dizziness, gait problem (stumbling) and numbness. Negative for headaches.  Psychiatric/Behavioral:  Positive for sleep disturbance.   All other systems reviewed and are negative.  PAST MEDICAL/SURGICAL HISTORY:  Past Medical History:  Diagnosis Date   Anxiety    Arthritis    Dysrhythmia    Hyperlipemia    Hypertension    Hyperthyroidism    Lung cancer (HCC)    Myocardial infarction ([facility])     Paroxysmal atrial fibrillation ([facility])    Port-A-Cath in place 11/14/2019   Tachycardia    Past Surgical History:  Procedure Laterality Date   APPLICATION OF CRANIAL NAVIGATION N/A 10/26/2020   Procedure: APPLICATION OF CRANIAL NAVIGATION;  Surgeon: Consuella Lose, MD;  Location: [facility];  Service: Neurosurgery;  Laterality: N/A;  RM 21   CARDIAC CATHETERIZATION     CRANIOTOMY Right 10/26/2020   Procedure: Stereotactic Right frontal craniotomy for resection of tumor with sonopet;  Surgeon: Consuella Lose, MD;  Location: [facility];  Service: Neurosurgery;  Laterality: Right;   PORTACATH PLACEMENT Right 11/01/2019   Procedure: INSERTION PORT-A-CATH;  Surgeon: Aviva Signs, MD;  Location: [facility];  Service: General;  Laterality: Right;    SOCIAL HISTORY:  Social History   Socioeconomic History   Marital status: Married    Spouse name: Not on file   Number of children: 1   Years of education: Not on file   Highest education level: Not on file  Occupational History   Occupation: retired   Occupation: Glass blower/designer: [employer]  Tobacco Use   Smoking status: Former    Years: 45.00    Types: Cigars, Cigarettes    Quit date: 10/19/2014    Years since quitting: 6.1   Smokeless tobacco: Never  Vaping Use   Vaping Use: Never used  Substance and Sexual Activity   Alcohol use: Not Currently    Comment: occ   Drug use: No   Sexual activity: Not on file  Other Topics Concern   Not on file  Social History Narrative   Not on file   Social Determinants of Health   Financial Resource Strain: Not on file  Food Insecurity: Not on file  Transportation Needs: Not on file  Physical Activity: Not on file  Stress: Not on file  Social Connections: Not on file  Intimate Partner Violence: Not on file    FAMILY HISTORY:  Family History  Problem Relation Age of Onset   Hypertension Father    Dementia Father        died age 71   Heart disease Father    Heart failure Other     CURRENT  MEDICATIONS:  Current Outpatient Medications  Medication Sig Dispense Refill   acetaminophen (TYLENOL) 500 MG tablet Take 500 mg by mouth every 6 (six) hours as needed for moderate pain or headache.     ALPRAZolam (XANAX) 0.25 MG tablet TAKE 1 TABLET BY MOUTH THREE TIMES DAILY AS NEEDED FOR ANXIETY 30 tablet 0   dextromethorphan (DELSYM) 30 MG/5ML liquid Take 30 mg by mouth as needed for cough.     digoxin (LANOXIN) 0.125 MG tablet Take 1 tablet (0.125 mg total) by mouth daily. 30 tablet 1   Durvalumab (IMFINZI IV) Inject into the vein.     ELIQUIS 5 MG TABS tablet Take 1 tablet by mouth twice daily 60 tablet 5   levETIRAcetam (KEPPRA) 500 MG tablet Take 1 tablet (500 mg total) by mouth 2 (two) times daily.  60 tablet 0   levothyroxine (SYNTHROID) 100 MCG tablet Take 1 tablet (100 mcg total) by mouth daily before breakfast. 30 tablet 3   lidocaine (XYLOCAINE) 2 % solution TAKE 15 ML BY MOUTH 4 TIMES DAILY WITH MEALS 480 mL 0   lidocaine-prilocaine (EMLA) cream APPLY SMALL AMOUNT TO PORTA-CATH SITE AND COVER WITH PLASTIC WRAP 1 HOUR PRIOR TO CHEMO APPOINTMENTS. 30 g 6   megestrol (MEGACE) 400 MG/10ML suspension Take 10 mLs (400 mg total) by mouth 2 (two) times daily. 480 mL 1   metoprolol tartrate (LOPRESSOR) 25 MG tablet Take 1 tablet (25 mg total) by mouth 2 (two) times daily. 180 tablet 3   Melatonin 10 MG TABS Take 10 tablets by mouth at bedtime as needed (sleep). (Patient not taking: No sig reported)     No current facility-administered medications for this visit.    ALLERGIES:  No Known Allergies  PHYSICAL EXAM:  Performance status (ECOG): 1 - Symptomatic but completely ambulatory  Vitals:   11/28/20 0903  BP: 115/85  Pulse: 68  Resp: 18  Temp: (!) 96.7 F (35.9 C)  SpO2: 100%   Wt Readings from Last 3 Encounters:  11/28/20 139 lb 12.8 oz (63.4 kg)  11/27/20 140 lb 8 oz (63.7 kg)  11/09/20 148 lb (67.1 kg)   Physical Exam Vitals reviewed.  Constitutional:       Appearance: Normal appearance.  Cardiovascular:     Rate and Rhythm: Normal rate and regular rhythm.     Pulses: Normal pulses.     Heart sounds: Normal heart sounds.  Pulmonary:     Effort: Pulmonary effort is normal.     Breath sounds: Normal breath sounds.  Neurological:     General: No focal deficit present.     Mental Status: He is alert and oriented to person, place, and time.  Psychiatric:        Mood and Affect: Mood normal.        Behavior: Behavior normal.    LABORATORY DATA:  I have reviewed the labs as listed.  CBC Latest Ref Rng & Units 11/28/2020 11/14/2020 10/26/2020  WBC 4.0 - 10.5 K/uL 9.4 9.4 -  Hemoglobin 13.0 - 17.0 g/dL 11.7(L) 12.2(L) 9.9(L)  Hematocrit 39.0 - 52.0 % 35.5(L) 36.6(L) 29.0(L)  Platelets 150 - 400 K/uL 190 221 -   CMP Latest Ref Rng & Units 11/28/2020 11/14/2020 10/30/2020  Glucose 70 - 99 mg/dL 103(H) 196(H) 204(H)  BUN 8 - 23 mg/dL 19 18 21   Creatinine 0.61 - 1.24 mg/dL 1.18 1.10 0.84  Sodium 135 - 145 mmol/L 134(L) 131(L) 131(L)  Potassium 3.5 - 5.1 mmol/L 4.1 3.7 4.8  Chloride 98 - 111 mmol/L 103 97(L) 97(L)  CO2 22 - 32 mmol/L 25 19(L) 26  Calcium 8.9 - 10.3 mg/dL 8.4(L) 8.4(L) 8.0(L)  Total Protein 6.5 - 8.1 g/dL 6.4(L) 6.6 -  Total Bilirubin 0.3 - 1.2 mg/dL 1.1 0.9 -  Alkaline Phos 38 - 126 U/L 93 75 -  AST 15 - 41 U/L 27 30 -  ALT 0 - 44 U/L 25 18 -    DIAGNOSTIC IMAGING:  I have independently reviewed the scans and discussed with the patient. No results found.   ASSESSMENT:  1.  Left lung cancer: -Presentation to the ER on 10/06/2019 with cough and dizziness. -CT chest with contrast on 10/06/2019 showed large left suprahilar mass completely obstructing left upper lobe bronchus, measuring 7 x 5.3 x 5.8 cm.  Mass extends into AP window  and narrows the left mainstem bronchus.  Small subpleural nodule in the left lower lobe measuring 4 mm indeterminate.  AP window nodal mass measuring 3.4 cm.  No supraclavicular adenopathy.  Subcarinal  enlarged lymph node measures 1.4 cm. -PET scan on 10/14/2019 shows large hypermetabolic left upper lobe mass surrounding the left hilum, extending into pleural surface not invading the chest wall.  Direct invasion into the AP window and metastatic subcarinal lymph node, T4 N2 M0.  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 -10 pound weight loss in the last 6 months.  Hoarseness for last 1 week. -MRI of the brain on 10/28/2019 with no evidence of metastasis. -Concurrent chemoradiation therapy started on 11/28/2019. Completed 01/02/20 ( chemo) 01/06/2020(radiation) -CT chest on 01/20/2020 showed significant interval decrease in consolidation of the left upper lobe, interval decrease in irregular nodularity and consolidation of the left lower lobe.  Unchanged posttreatment appearance of soft tissue about the left hilum and AP window.  No discretely enlarged mediastinal or hilar lymph nodes.  Trace left pleural effusion, decreased compared to prior exam. -Consolidation durvalumab started on 01/30/2020. -CT chest with contrast on 03/30/2020 with the development of small left-sided pleural effusion with further volume loss.  Bandlike scarring in the left lower lobe since prior study likely posttreatment changes.  Small bone lesion at T1 stable.  1 small nodule in the right chest which is new about 4 mm.  Will monitor.   2.  Social/family history: -He smoked 1 pack/day for 45 years, quit 5 years ago. -No family history of malignancies.   PLAN:  1.  Advanced squamous cell carcinoma of the left lung: - He received 1 cycle of docetaxel on 10/03/2020. - He is mostly sleeping during daytime.  He is reportedly awake only 2 hours a day.  He is yet to start physical therapy when he will go there on 12/06/2020. - He is also not eating much.  He walks and occasionally uses with cane. - Based on today's performance status, I am not offering him any  chemotherapy. - I will reevaluate him in 3 weeks.  I also plan to repeat CT of the chest at that time. - We will start him on Ritalin 5 mg in the mornings for fatigue.  2.  Elevated blood sugar: - His last glucose was elevated at 196.  Hemoglobin A1c was 6.6. - Today his sugar was 103.   3.  Atrial fibrillation: - Continue Eliquis 5 mg twice daily.  No bleeding issues.   4.  Weight loss: - Marinol is no longer helping.  He started on Megace 400 twice daily about 10 days back. - He lost 8 pounds since last visit. - He is eating only 1 meal per day.  Denies any nausea or vomiting.   5.  Hypertension: - Continue metoprolol.  Blood pressure today is 118/85.   6.  Hypothyroidism: - Continue Synthroid.  Last TSH was 1.35.  7.  Cough: - He has mostly dry cough.  Delsym not helping. - We will send hydrocodone syrup to be taken every 8 hours as needed.  8.  Right frontal lobe mass: - Stereotactic right frontal craniotomy for resection of tumor on 10/26/2020. - He is off of dexamethasone. - He is being tapered off of Keppra.   Orders placed this encounter:  No orders of the defined types were placed in this encounter.    Derek Jack, MD [place] [phone]  I, Thana Ates, am acting as a Education administrator for Dr. Derek Jack.  I, Derek Jack MD, have reviewed the above documentation for accuracy and completeness, and I agree with the above.

## 2020-11-27 NOTE — Progress Notes (Signed)
 [facility] at  [address]  [place], [place] [address] [phone]   New Patient Evaluation  Date of Service: 11/27/20 Patient Name: Alan [name] Patient MRN: [id number] Patient DOB: 03/16/1950 Provider:  K , MD  Identifying Statement:  Alan [name] is a 70 y.o. male with Brain metastasis (HCC) who presents for initial consultation and evaluation regarding cancer associated neurologic deficits.    Referring Provider: [facility] [address] [place],  [place] [address]  Primary Cancer:  Oncologic History: Oncology History  Squamous cell lung cancer, left (HCC)  11/08/2019 Initial Diagnosis   Squamous cell lung cancer, left (HCC)   11/08/2019 Cancer Staging   Staging form: Lung, AJCC 8th Edition - Clinical stage from 11/08/2019: Stage IIIB (cT4, cN2, cM0) - Signed by Katragadda, Sreedhar, MD on 11/08/2019   11/28/2019 - 01/02/2020 Chemotherapy   The patient had palonosetron (ALOXI) injection 0.25 mg, 0.25 mg, Intravenous,  Once, 6 of 6 cycles Administration: 0.25 mg (11/28/2019), 0.25 mg (12/05/2019), 0.25 mg (12/12/2019), 0.25 mg (12/19/2019), 0.25 mg (12/26/2019), 0.25 mg (01/02/2020) CARBOplatin (PARAPLATIN) 180 mg in sodium chloride 0.9 % 250 mL chemo infusion, 180 mg (100 % of original dose 176 mg), Intravenous,  Once, 6 of 6 cycles Dose modification:   (original dose 176 mg, Cycle 1),   (original dose 201.2 mg, Cycle 2),   (original dose 179.2 mg, Cycle 3),   (original dose 179.2 mg, Cycle 4),   (original dose 195.4 mg, Cycle 5),   (original dose 178.2 mg, Cycle 6) Administration: 180 mg (11/28/2019), 200 mg (12/05/2019), 180 mg (12/12/2019), 180 mg (12/19/2019), 190 mg (12/26/2019), 180 mg (01/02/2020) PACLitaxel (TAXOL) 84 mg in sodium chloride 0.9 % 250 mL chemo infusion (</= 80mg/m2), 45 mg/m2 = 84 mg, Intravenous,  Once, 6 of 6 cycles Administration: 84 mg (11/28/2019), 84 mg (12/05/2019), 84 mg  (12/12/2019), 84 mg (12/19/2019), 84 mg (12/26/2019), 84 mg (01/02/2020)   for chemotherapy treatment.     01/30/2020 - 09/11/2020 Chemotherapy          10/03/2020 -  Chemotherapy   Patient is on Treatment Plan : LUNG Docetaxel + Ramucirumab q21d       CNS Oncologic History 10/25/20: Completes 27Gy/3fx as pre-op SRS to R frontal met ([facility]) 10/26/20: Craniotomy, resection R frontal with Dr. Nundkumar  History of Present Illness: The patient's records from the referring physician were obtained and reviewed and the patient interviewed to confirm this HPI.  Alan [name] presents today for follow up, now one month removed from pre-op radiation and craniotomy.  Since diagnosis, treatment, he has felt very tired, withdrawn.  He is moving/exercising minimally due to fatigue, imbalance, and occasional light-headedness.  He is independent with ADLs for the most part, but doesn't feel motivation to eat or be active/engaged.  He is walking around the home with a cane currently.  Dr. Katragadda is currently holding chemo for squamous lung cancer due to functional impairment, low albumin.  Medications: Current Outpatient Medications on File Prior to Visit  Medication Sig Dispense Refill   acetaminophen (TYLENOL) 500 MG tablet Take 500 mg by mouth every 6 (six) hours as needed for moderate pain or headache. (Patient not taking: Reported on 11/14/2020)     ALPRAZolam (XANAX) 0.25 MG tablet TAKE 1 TABLET BY MOUTH THREE TIMES DAILY AS NEEDED FOR ANXIETY (Patient not taking: Reported on 11/14/2020) 30 tablet 0   chlorproMAZINE (THORAZINE) 25 MG tablet Take 1 tablet (25 mg   total) by mouth every 6 (six) hours as needed. (Patient not taking: Reported on 11/14/2020) 30 tablet 0   dextromethorphan (DELSYM) 30 MG/5ML liquid Take 30 mg by mouth as needed for cough.     digoxin (LANOXIN) 0.125 MG tablet Take 1 tablet (0.125 mg total) by mouth daily. 30 tablet 1   Durvalumab (IMFINZI IV) Inject into the vein.      ELIQUIS 5 MG TABS tablet Take 1 tablet by mouth twice daily 60 tablet 5   levETIRAcetam (KEPPRA) 500 MG tablet Take 1 tablet (500 mg total) by mouth 2 (two) times daily. 60 tablet 0   levothyroxine (SYNTHROID) 100 MCG tablet Take 1 tablet (100 mcg total) by mouth daily before breakfast. 30 tablet 3   lidocaine (XYLOCAINE) 2 % solution TAKE 15 ML BY MOUTH 4 TIMES DAILY WITH MEALS 480 mL 0   lidocaine-prilocaine (EMLA) cream APPLY SMALL AMOUNT TO PORTA-CATH SITE AND COVER WITH PLASTIC WRAP 1 HOUR PRIOR TO CHEMO APPOINTMENTS. 30 g 6   megestrol (MEGACE) 400 MG/10ML suspension Take 10 mLs (400 mg total) by mouth 2 (two) times daily. 480 mL 1   Melatonin 10 MG TABS Take 10 tablets by mouth at bedtime as needed (sleep). (Patient not taking: Reported on 11/14/2020)     methylPREDNISolone (MEDROL DOSEPAK) 4 MG TBPK tablet Take as directed on package 1 each 0   metoprolol tartrate (LOPRESSOR) 25 MG tablet Take 1 tablet (25 mg total) by mouth 2 (two) times daily. 180 tablet 3   No current facility-administered medications on file prior to visit.    Allergies: No Known Allergies Past Medical History:  Past Medical History:  Diagnosis Date   Anxiety    Arthritis    Dysrhythmia    Hyperlipemia    Hypertension    Hyperthyroidism    Lung cancer (HCC)    Myocardial infarction (HCC)    Paroxysmal atrial fibrillation (HCC)    Port-A-Cath in place 11/14/2019   Tachycardia    Past Surgical History:  Past Surgical History:  Procedure Laterality Date   APPLICATION OF CRANIAL NAVIGATION N/A 10/26/2020   Procedure: APPLICATION OF CRANIAL NAVIGATION;  Surgeon: Nundkumar, Neelesh, MD;  Location: [facility] OR;  Service: Neurosurgery;  Laterality: N/A;  RM 21   CARDIAC CATHETERIZATION     CRANIOTOMY Right 10/26/2020   Procedure: Stereotactic Right frontal craniotomy for resection of tumor with sonopet;  Surgeon: Nundkumar, Neelesh, MD;  Location: [facility] OR;  Service: Neurosurgery;  Laterality: Right;   PORTACATH PLACEMENT  Right 11/01/2019   Procedure: INSERTION PORT-A-CATH;  Surgeon: Jenkins, Mark, MD;  Location: [facility];  Service: General;  Laterality: Right;   Social History:  Social History   Socioeconomic History   Marital status: Married    Spouse name: Not on file   Number of children: 1   Years of education: Not on file   Highest education level: Not on file  Occupational History   Occupation: retired   Occupation: stocker    Employer: [employer]  Tobacco Use   Smoking status: Former    Years: 45.00    Types: Cigars, Cigarettes    Quit date: 10/19/2014    Years since quitting: 6.1   Smokeless tobacco: Never  Vaping Use   Vaping Use: Never used  Substance and Sexual Activity   Alcohol use: Not Currently    Comment: occ   Drug use: No   Sexual activity: Not on file  Other Topics Concern   Not on file  Social History   Narrative   Not on file   Social Determinants of Health   Financial Resource Strain: Not on file  Food Insecurity: Not on file  Transportation Needs: Not on file  Physical Activity: Not on file  Stress: Not on file  Social Connections: Not on file  Intimate Partner Violence: Not on file   Family History:  Family History  Problem Relation Age of Onset   Hypertension Father    Dementia Father        died age 85   Heart disease Father    Heart failure Other     Review of Systems: Constitutional: Doesn't report fevers, chills or abnormal weight loss Eyes: Doesn't report blurriness of vision Ears, nose, mouth, throat, and face: Doesn't report sore throat Respiratory: Doesn't report cough, dyspnea or wheezes Cardiovascular: Doesn't report palpitation, chest discomfort  Gastrointestinal:  Doesn't report nausea, constipation, diarrhea GU: Doesn't report incontinence Skin: Doesn't report skin rashes Neurological: Per HPI Musculoskeletal: Doesn't report joint pain Behavioral/Psych: Doesn't report anxiety  Physical Exam: Vitals:   11/27/20 1010 11/27/20 1021  BP:   128/80  Pulse:  88  Resp:  18  Temp:  (!) 96 F (35.6 C)  SpO2: 98%    KPS: 70. General: Alert, cooperative, pleasant, in no acute distress Head: Normal EENT: Hoarseness Lungs: Resp effort normal Cardiac: Regular rate Abdomen: Non-distended abdomen Skin: No rashes cyanosis or petechiae. Extremities: No clubbing or edema  Neurologic Exam: Mental Status: Awake, alert, attentive to examiner. Oriented to self and environment. Language is fluent with intact comprehension.  Psychomotor slowing. Cranial Nerves: Visual acuity is grossly normal. Visual fields are full. Extra-ocular movements intact. No ptosis. Face is symmetric Motor: Tone and bulk are normal. Power is full in both arms and legs. Reflexes are symmetric, no pathologic reflexes present.  Sensory: Intact to light touch Gait: Dystaxic, cane assisted   Labs: I have reviewed the data as listed    Component Value Date/Time   NA 131 (L) 11/14/2020 0847   K 3.7 11/14/2020 0847   CL 97 (L) 11/14/2020 0847   CO2 19 (L) 11/14/2020 0847   GLUCOSE 196 (H) 11/14/2020 0847   BUN 18 11/14/2020 0847   CREATININE 1.10 11/14/2020 0847   CALCIUM 8.4 (L) 11/14/2020 0847   PROT 6.6 11/14/2020 0847   ALBUMIN 2.6 (L) 11/14/2020 0847   AST 30 11/14/2020 0847   ALT 18 11/14/2020 0847   ALKPHOS 75 11/14/2020 0847   BILITOT 0.9 11/14/2020 0847   GFRNONAA >60 11/14/2020 0847   GFRAA >60 11/01/2019 1049   Lab Results  Component Value Date   WBC 9.4 11/14/2020   NEUTROABS 8.3 (H) 11/14/2020   HGB 12.2 (L) 11/14/2020   HCT 36.6 (L) 11/14/2020   MCV 92.7 11/14/2020   PLT 221 11/14/2020    Imaging: CLINICAL DATA:  Brain/CNS neoplasm, assess treatment response   EXAM: MRI HEAD WITHOUT AND WITH CONTRAST   TECHNIQUE: Multiplanar, multiecho pulse sequences of the brain and surrounding structures were obtained without and with intravenous contrast.   CONTRAST:  6.8mL GADAVIST GADOBUTROL 1 MMOL/ML IV SOLN   COMPARISON:  MRI head  10/12/2020.   FINDINGS: Brain: Interval right frontal craniotomy and resection of the previously seen enhancing right frontal lesion. Expected hemorrhage and mild devitalized tissue along the periphery of the resection cavity. Small-to-moderate volume anti dependent pneumocephalus along the right frontal convexity with small volume extra-axial fluid/hemorrhage. Resulting mild mass effect on the adjacent frontal lobe. Right frontal lobe edema appears improved relative   to prior. Mild (3-4 mm) leftward midline shift, slightly increased. No nodular or masslike enhancement at the resection site to suggest residual tumor. No enhancing lesions seen elsewhere. No acute infarct. No hydrocephalus. Trace susceptibility artifact and T2 hypointensity in bilateral occipital horns, compatible with trace intraventricular hemorrhage. Additional mild for age scattered T2 hyperintensity in the white matter, nonspecific but compatible with chronic microvascular ischemic disease.   Vascular: Major arterial flow voids are maintained the skull base.   Skull and upper cervical spine: Right frontal craniotomy. Otherwise normal marrow signal.   Sinuses/Orbits: Moderate scattered paranasal sinus mucosal thickening with air-fluid levels and frothy secretions in bilateral maxillary sinuses. No acute orbital findings.   Other: Small left and trace right mastoid effusions.   IMPRESSION: 1. Postoperative changes associated with right frontal craniotomy and resection of a right frontal lobe enhancing lesion. Small to moderate pneumocephalus and small volume extra-axial fluid/hemorrhage along the right frontal convexity with overall decreased right frontal lobe edema and slightly increased mild (3-4 mm) leftward midline shift. No nodular/masslike enhancement to suggest residual tumor. 2. Trace intraventricular hemorrhage layering in the occipital horns bilaterally. 3. Moderate scattered paranasal sinus mucosal  thickening with air-fluid levels and frothy secretions in bilateral maxillary sinuses.     Electronically Signed   By: Margaretha Sheffield M.D.   On: 10/26/2020 17:07   Assessment/Plan Brain metastasis ([place])  Alan [name] presents with clinical syndrome localizing to right frontal lobe.  He is clinically stable today from neurologic standpoint.  More broadly, he has moved towards failure to thrive picture with impairments in appetite, energy, motivation and overall activity.  Recent serum testing demonstrated only hypoalbuminemia.  Steroid dose pack did not make significant difference, either.  We counseled him today on goals of care and lifestyle choices, including need for physical activity, caloric/fluid intake, good quality sleep and social engagement.  He is not ready for palliative-only or comfort care approach to his lung and brain cancer.  Will be aided by physical therapy evaluation this week.  Will continue to follow with Dr. Delton Coombes who we re-evaluate candidacy for systemic therapy.  He should remain off dexamethasone for now.  We spent twenty additional minutes teaching regarding the natural history, biology, and historical experience in the treatment of neurologic complications of cancer.   We appreciate the opportunity to participate in the care of Alan [name].    We ask that Alan [name] return to clinic in 2 months following next brain MRI, or sooner as needed.  All questions were answered. The patient knows to call the clinic with any problems, questions or concerns. No barriers to learning were detected.  The total time spent in the encounter was 40 minutes and more than 50% was on counseling and review of test results   Ventura Sellers, MD Medical Director of Neuro-Oncology [facility] at [place] 11/27/20 9:54 AM

## 2020-11-28 ENCOUNTER — Other Ambulatory Visit: Payer: Self-pay | Admitting: Radiation Therapy

## 2020-11-28 ENCOUNTER — Telehealth: Payer: Self-pay | Admitting: Internal Medicine

## 2020-11-28 ENCOUNTER — Other Ambulatory Visit (HOSPITAL_COMMUNITY): Payer: Self-pay | Admitting: *Deleted

## 2020-11-28 ENCOUNTER — Inpatient Hospital Stay (HOSPITAL_COMMUNITY): Payer: Medicare Other | Attending: Hematology and Oncology

## 2020-11-28 ENCOUNTER — Inpatient Hospital Stay (HOSPITAL_BASED_OUTPATIENT_CLINIC_OR_DEPARTMENT_OTHER): Payer: Medicare Other | Admitting: Hematology

## 2020-11-28 ENCOUNTER — Encounter (HOSPITAL_COMMUNITY): Payer: Self-pay | Admitting: Hematology

## 2020-11-28 ENCOUNTER — Inpatient Hospital Stay (HOSPITAL_COMMUNITY): Payer: Medicare Other

## 2020-11-28 VITALS — BP 115/85 | HR 68 | Temp 96.7°F | Resp 18 | Wt 139.8 lb

## 2020-11-28 DIAGNOSIS — R059 Cough, unspecified: Secondary | ICD-10-CM | POA: Insufficient documentation

## 2020-11-28 DIAGNOSIS — R634 Abnormal weight loss: Secondary | ICD-10-CM | POA: Insufficient documentation

## 2020-11-28 DIAGNOSIS — C3492 Malignant neoplasm of unspecified part of left bronchus or lung: Secondary | ICD-10-CM | POA: Diagnosis not present

## 2020-11-28 DIAGNOSIS — I10 Essential (primary) hypertension: Secondary | ICD-10-CM | POA: Diagnosis not present

## 2020-11-28 DIAGNOSIS — R739 Hyperglycemia, unspecified: Secondary | ICD-10-CM | POA: Insufficient documentation

## 2020-11-28 DIAGNOSIS — Z452 Encounter for adjustment and management of vascular access device: Secondary | ICD-10-CM | POA: Diagnosis not present

## 2020-11-28 DIAGNOSIS — C3412 Malignant neoplasm of upper lobe, left bronchus or lung: Secondary | ICD-10-CM | POA: Insufficient documentation

## 2020-11-28 DIAGNOSIS — E039 Hypothyroidism, unspecified: Secondary | ICD-10-CM | POA: Diagnosis not present

## 2020-11-28 DIAGNOSIS — Z7901 Long term (current) use of anticoagulants: Secondary | ICD-10-CM | POA: Insufficient documentation

## 2020-11-28 DIAGNOSIS — I4891 Unspecified atrial fibrillation: Secondary | ICD-10-CM | POA: Insufficient documentation

## 2020-11-28 DIAGNOSIS — Z95828 Presence of other vascular implants and grafts: Secondary | ICD-10-CM

## 2020-11-28 LAB — COMPREHENSIVE METABOLIC PANEL
ALT: 25 U/L (ref 0–44)
AST: 27 U/L (ref 15–41)
Albumin: 2.7 g/dL — ABNORMAL LOW (ref 3.5–5.0)
Alkaline Phosphatase: 93 U/L (ref 38–126)
Anion gap: 6 (ref 5–15)
BUN: 19 mg/dL (ref 8–23)
CO2: 25 mmol/L (ref 22–32)
Calcium: 8.4 mg/dL — ABNORMAL LOW (ref 8.9–10.3)
Chloride: 103 mmol/L (ref 98–111)
Creatinine, Ser: 1.18 mg/dL (ref 0.61–1.24)
GFR, Estimated: >60 mL/min (ref >60)
Glucose, Bld: 103 mg/dL — ABNORMAL HIGH (ref 70–99)
Potassium: 4.1 mmol/L (ref 3.5–5.1)
Sodium: 134 mmol/L — ABNORMAL LOW (ref 135–145)
Total Bilirubin: 1.1 mg/dL (ref 0.3–1.2)
Total Protein: 6.4 g/dL — ABNORMAL LOW (ref 6.5–8.1)

## 2020-11-28 LAB — CBC WITH DIFFERENTIAL/PLATELET
Abs Immature Granulocytes: 0.14 10*3/uL — ABNORMAL HIGH (ref 0.00–0.07)
Basophils Absolute: 0.1 10*3/uL (ref 0.0–0.1)
Basophils Relative: 1 %
Eosinophils Absolute: 0 10*3/uL (ref 0.0–0.5)
Eosinophils Relative: 0 %
HCT: 35.5 % — ABNORMAL LOW (ref 39.0–52.0)
Hemoglobin: 11.7 g/dL — ABNORMAL LOW (ref 13.0–17.0)
Immature Granulocytes: 2 %
Lymphocytes Relative: 12 %
Lymphs Abs: 1.1 10*3/uL (ref 0.7–4.0)
MCH: 31.1 pg (ref 26.0–34.0)
MCHC: 33 g/dL (ref 30.0–36.0)
MCV: 94.4 fL (ref 80.0–100.0)
Monocytes Absolute: 0.9 10*3/uL (ref 0.1–1.0)
Monocytes Relative: 10 %
Neutro Abs: 7.1 10*3/uL (ref 1.7–7.7)
Neutrophils Relative %: 75 %
Platelets: 190 10*3/uL (ref 150–400)
RBC: 3.76 MIL/uL — ABNORMAL LOW (ref 4.22–5.81)
RDW: 15.2 % (ref 11.5–15.5)
WBC: 9.4 10*3/uL (ref 4.0–10.5)
nRBC: 0 % (ref 0.0–0.2)

## 2020-11-28 LAB — MAGNESIUM: Magnesium: 1.7 mg/dL (ref 1.7–2.4)

## 2020-11-28 MED ORDER — HEPARIN SOD (PORK) LOCK FLUSH 100 UNIT/ML IV SOLN
500.0000 [IU] | Freq: Once | INTRAVENOUS | Status: AC
  Administered 2020-11-28: 500 [IU] via INTRAVENOUS

## 2020-11-28 MED ORDER — SODIUM CHLORIDE 0.9% FLUSH
10.0000 mL | INTRAVENOUS | Status: DC | PRN
  Administered 2020-11-28: 10 mL via INTRAVENOUS

## 2020-11-28 MED ORDER — METHYLPHENIDATE HCL 5 MG PO TABS: 5.0000 mg | ORAL_TABLET | Freq: Every day | ORAL | 0 refills | Status: DC

## 2020-11-28 MED ORDER — HYDROCODONE BIT-HOMATROP MBR 5-1.5 MG/5ML PO SOLN: 15.0000 mL | Freq: Three times a day (TID) | ORAL | 0 refills | Status: DC

## 2020-11-28 NOTE — Patient Instructions (Addendum)
[facility] at [facility] Discharge Instructions  You were seen and examined by Dr. Delton Coombes today.   We will hold your treatment today until you are stronger.  Try to eat better so you can gain weight and strength in order to be strong enough to receive chemotherapy.  Prescriptions sent to [facility] for Ritalin (to help with energy) and Hycodan cough syrup.  Return to clinic as scheduled for lab work, office visit, and possible treatment.   Thank you for choosing [facility] at [facility] to provide your oncology and hematology care.  To afford each patient quality time with our provider, please arrive at least 15 minutes before your scheduled appointment time.   If you have a lab appointment with the [facility] please come in thru the Main Entrance and check in at the main information desk.  You need to re-schedule your appointment should you arrive 10 or more minutes late.  We strive to give you quality time with our providers, and arriving late affects you and other patients whose appointments are after yours.  Also, if you no show three or more times for appointments you may be dismissed from the clinic at the providers discretion.     Again, thank you for choosing [facility].  Our hope is that these requests will decrease the amount of time that you wait before being seen by our physicians.       _____________________________________________________________  Should you have questions after your visit to [facility], please contact our office at [phone] and follow the prompts.  Our office hours are 8:00 a.m. and 4:30 p.m. Monday - Friday.  Please note that voicemails left after 4:00 p.m. may not be returned until the following business day.  We are closed weekends and major holidays.  You do have access to a nurse 24-7, just call the main number to the clinic [phone] and do not press any options, hold  on the line and a nurse will answer the phone.    For prescription refill requests, have your pharmacy contact our office and allow 72 hours.    Due to Covid, you will need to wear a mask upon entering the hospital. If you do not have a mask, a mask will be given to you at the Main Entrance upon arrival. For doctor visits, patients may have 1 support person age 39 or older with them. For treatment visits, patients can not have anyone with them due to social distancing guidelines and our immunocompromised population.

## 2020-11-28 NOTE — Telephone Encounter (Signed)
Scheduled appt per 10/11 los. Called pt, no answer. Left msg with appt date and time.

## 2020-11-28 NOTE — Patient Instructions (Signed)
[facility]  Discharge Instructions: Thank you for choosing [facility] to provide your oncology and hematology care.  If you have a lab appointment with the Cancer Center, please come in thru the Main Entrance and check in at the main information desk.  Wear comfortable clothing and clothing appropriate for easy access to any Portacath or PICC line.   We strive to give you quality time with your provider. You may need to reschedule your appointment if you arrive late (15 or more minutes).  Arriving late affects you and other patients whose appointments are after yours.  Also, if you miss three or more appointments without notifying the office, you may be dismissed from the clinic at the provider's discretion.      For prescription refill requests, have your pharmacy contact our office and allow 72 hours for refills to be completed.        To help prevent nausea and vomiting after your treatment, we encourage you to take your nausea medication as directed.  BELOW ARE SYMPTOMS THAT SHOULD BE REPORTED IMMEDIATELY: *FEVER GREATER THAN 100.4 F (38 C) OR HIGHER *CHILLS OR SWEATING *NAUSEA AND VOMITING THAT IS NOT CONTROLLED WITH YOUR NAUSEA MEDICATION *UNUSUAL SHORTNESS OF BREATH *UNUSUAL BRUISING OR BLEEDING *URINARY PROBLEMS (pain or burning when urinating, or frequent urination) *BOWEL PROBLEMS (unusual diarrhea, constipation, pain near the anus) TENDERNESS IN MOUTH AND THROAT WITH OR WITHOUT PRESENCE OF ULCERS (sore throat, sores in mouth, or a toothache) UNUSUAL RASH, SWELLING OR PAIN  UNUSUAL VAGINAL DISCHARGE OR ITCHING   Items with * indicate a potential emergency and should be followed up as soon as possible or go to the Emergency Department if any problems should occur.  Please show the CHEMOTHERAPY ALERT CARD or IMMUNOTHERAPY ALERT CARD at check-in to the Emergency Department and triage nurse.  Should you have questions after your visit or need to cancel  or reschedule your appointment, please contact [facility] [phone]  and follow the prompts.  Office hours are 8:00 a.m. to 4:30 p.m. Monday - Friday. Please note that voicemails left after 4:00 p.m. may not be returned until the following business day.  We are closed weekends and major holidays. You have access to a nurse at all times for urgent questions. Please call the main number to the clinic [phone] and follow the prompts.  For any non-urgent questions, you may also contact your provider using MyChart. We now offer e-Visits for anyone 18 and older to request care online for non-urgent symptoms. For details visit [email or web address].   Also download the MyChart app! Go to the app store, search "MyChart", open the app, select [facility], and log in with your MyChart username and password.  Due to Covid, a mask is required upon entering the hospital/clinic. If you do not have a mask, one will be given to you upon arrival. For doctor visits, patients may have 1 support person aged 18 or older with them. For treatment visits, patients cannot have anyone with them due to current Covid guidelines and our immunocompromised population.  

## 2020-11-28 NOTE — Progress Notes (Signed)
Hold chemotherapy treatment today per Dr. Delton Coombes.  Patient will return in 3 weeks for re-evaluation.  Patients port flushed without difficulty.  Good blood return noted with no bruising or swelling noted at site.  Band aid applied.  VSS with discharge and left in satisfactory condition with no s/s of distress noted.

## 2020-12-02 ENCOUNTER — Other Ambulatory Visit: Payer: Self-pay | Admitting: Cardiology

## 2020-12-03 NOTE — Telephone Encounter (Signed)
Prescription refill request for Eliquis received. Indication: Atrial fib Last office visit: 11/09/20  Lawanda Cousins FNP Scr: 1.18 on 11/28/20 Age: 70 Weight: 67.1kg  Based on above findings Eliquis 5mg  twice daily is the appropriate dose.  Refill approved.

## 2020-12-06 ENCOUNTER — Encounter (HOSPITAL_COMMUNITY): Payer: Self-pay | Admitting: Physical Therapy

## 2020-12-06 ENCOUNTER — Other Ambulatory Visit: Payer: Self-pay

## 2020-12-06 ENCOUNTER — Ambulatory Visit (HOSPITAL_COMMUNITY): Payer: Medicare Other | Attending: Hematology | Admitting: Physical Therapy

## 2020-12-06 ENCOUNTER — Telehealth: Payer: Self-pay | Admitting: Dietician

## 2020-12-06 DIAGNOSIS — R2689 Other abnormalities of gait and mobility: Secondary | ICD-10-CM | POA: Insufficient documentation

## 2020-12-06 DIAGNOSIS — M6281 Muscle weakness (generalized): Secondary | ICD-10-CM | POA: Diagnosis not present

## 2020-12-06 DIAGNOSIS — R262 Difficulty in walking, not elsewhere classified: Secondary | ICD-10-CM | POA: Diagnosis present

## 2020-12-06 NOTE — Therapy (Signed)
[place], [place], [address] Phone: [phone]   Fax:  [phone]  Physical Therapy Evaluation  Patient Details  Name: Alan [name] MRN: [id number] Date of Birth: 1950/05/30 Referring Provider (PT): Derek Jack, MD   Encounter Date: 12/06/2020   PT End of Session - 12/06/20 1042     Visit Number 1    Number of Visits 12    Date for PT Re-Evaluation 01/17/21    Authorization Type [place]  - visit med [place], secondary medicaid traditional - no auth req with medicare as primary    Progress Note Due on Visit 10    PT Start Time 1048    PT Stop Time 1130    PT Time Calculation (min) 42 min    Activity Tolerance Patient limited by fatigue    Behavior During Therapy [facility] for tasks assessed/performed;Flat affect             Past Medical History:  Diagnosis Date   Anxiety    Arthritis    Dysrhythmia    Hyperlipemia    Hypertension    Hyperthyroidism    Lung cancer ([place])    Myocardial infarction ([place])    Paroxysmal atrial fibrillation ([place])    Port-A-Cath in place 11/14/2019   Tachycardia     Past Surgical History:  Procedure Laterality Date   APPLICATION OF CRANIAL NAVIGATION N/A 10/26/2020   Procedure: APPLICATION OF CRANIAL NAVIGATION;  Surgeon: Consuella Lose, MD;  Location: [place];  Service: Neurosurgery;  Laterality: N/A;  RM 21   CARDIAC CATHETERIZATION     CRANIOTOMY Right 10/26/2020   Procedure: Stereotactic Right frontal craniotomy for resection of tumor with sonopet;  Surgeon: Consuella Lose, MD;  Location: [facility];  Service: Neurosurgery;  Laterality: Right;   PORTACATH PLACEMENT Right 11/01/2019   Procedure: INSERTION PORT-A-CATH;  Surgeon: Aviva Signs, MD;  Location: [facility];  Service: General;  Laterality: Right;    There were no vitals filed for this visit.    Subjective Assessment - 12/06/20 1053     Subjective Patient presents to therapy with complaints of  weakness, gait issues and balance issues prior to his most recent brain surgery. Reports he has active cancer and was doing radiation when they found the brain tumor where they did a right frontal craniotomy and that they haven't started back up on the chemo because of how weak he is.    Pertinent History right frontal craniotomy, lung cancer    Patient Stated Goals want to be able to get up/off toilet by himself and be able to walk good.    Currently in Pain? No/denies                [facility] PT Assessment - 12/06/20 0001       Assessment   Medical Diagnosis lung cancer    Referring Provider (PT) Derek Jack, MD      Balance Screen   Has the patient fallen in the past 6 months Yes    How many times? >30    Has the patient had a decrease in activity level because of a fear of falling?  Yes      Transfers   Five time sit to stand comments  17.3   arms crossed     Ambulation/Gait   Ambulation/Gait Yes    Ambulation Distance (Feet) 226 Feet    Assistive device None    Gait Pattern Wide base of support;Decreased trunk rotation;Decreased  stride length;Step-through pattern;Right foot flat;Left foot flat    Ambulation Surface Level;Indoor    Gait velocity decreased    Gait Comments 2MW, had to stop once to take a standing rest break - increased respiratory rate noted      Standardized Balance Assessment   Standardized Balance Assessment Dynamic Gait Index      Dynamic Gait Index   Level Surface Mild Impairment    Change in Gait Speed Mild Impairment    Gait with Horizontal Head Turns Mild Impairment    Gait with Vertical Head Turns Mild Impairment    Gait and Pivot Turn Mild Impairment    Step Over Obstacle Moderate Impairment    Step Around Obstacles Mild Impairment    Steps Moderate Impairment    Total Score 14                        Objective measurements completed on examination: See above findings.       [facility] Adult PT Treatment/Exercise -  12/06/20 0001       Exercises   Exercises Lumbar      Lumbar Exercises: Seated   Long Arc Quad on Chair 10 reps;Both                     PT Education - 12/06/20 1117     Education Details on current presentation, on benefits of RW, on HEP and POC    Person(s) Educated Patient    Methods Explanation    Comprehension Verbalized understanding              PT Short Term Goals - 12/06/20 1046       PT SHORT TERM GOAL #1   Title Patient will be independent in self management strategies to improve quality of life and functional outcomes.    Time 3    Period Weeks    Status New    Target Date 12/27/20      PT SHORT TERM GOAL #2   Title Patient will be able to transition from Sit to stand 5x < 14 seconds without use of upper extremities    Time 3    Period Weeks    Target Date 12/27/20      PT SHORT TERM GOAL #3   Title Patient will report at least 25% improvement in overall symptoms and/or function to demonstrate improved functional mobility    Time 3    Period Weeks    Status New    Target Date 12/27/20               PT Long Term Goals - 12/06/20 1046       PT LONG TERM GOAL #1   Title Patient will report at least 50% improvement in overall symptoms and/or function to demonstrate improved functional mobility    Time 6    Period Weeks    Status New    Target Date 01/17/21      PT LONG TERM GOAL #2   Title Patient will be score with at least 19/24 on DGI to demonstrate improved dynamic balance    Time 6    Period Weeks    Status New    Target Date 01/17/21      PT LONG TERM GOAL #3   Title Patient will be able to get up off toilet at home without assistance to demonstrate improved transitional mobility    Time 6    Period  Weeks    Status New    Target Date 01/17/21                    Plan - 12/06/20 1123     Clinical Impression Statement Patient is a 70 y.o. male who presents to physical therapy with complaint of weakness,  balance issues and difficulties walking. Patient with current diagnosis of lung cancer with recent right craniotomy and current hold on chemotherapy secondary to weakness. Patient demonstrates decreased strength, balance deficits and gait abnormalities which are negatively impacting patient ability to perform ADLs and functional mobility tasks. Patient will benefit from skilled physical therapy services to address these deficits to improve level of function with ADLs, functional mobility tasks, and reduce risk for falls.    Personal Factors and Comorbidities Comorbidity 1;Comorbidity 2;Comorbidity 3+    Comorbidities active lung cancer, right frontal craniotomy    Examination-Activity Limitations Bathing;Squat;Stairs;Stand;Locomotion Level;Transfers;Dressing    Examination-Participation Restrictions Meal Prep;Yard Work;Community Activity;Shop;Cleaning    Stability/Clinical Decision Making Evolving/Moderate complexity    Clinical Decision Making Moderate    Rehab Potential Fair    PT Frequency 2x / week    PT Duration 6 weeks    PT Treatment/Interventions ADLs/Self Care Home Management;Canalith Repostioning;Cryotherapy;Electrical Stimulation;Therapeutic exercise;Balance training;Therapeutic activities;Manual techniques;Patient/family education;Neuromuscular re-education;Gait training;DME Instruction    PT Next Visit Plan functional strength and balance    PT Home Exercise Plan LAQ    Consulted and Agree with Plan of Care Patient;Family member/caregiver    Family Member Consulted wife - [name]             Patient will benefit from skilled therapeutic intervention in order to improve the following deficits and impairments:  Difficulty walking, Decreased mobility, Decreased balance, Decreased activity tolerance, Decreased endurance, Decreased knowledge of use of DME, Decreased knowledge of precautions, Cardiopulmonary status limiting activity, Decreased range of motion, Postural dysfunction  Visit  Diagnosis: Muscle weakness (generalized)  Difficulty in walking, not elsewhere classified  Balance problem     Problem List Patient Active Problem List   Diagnosis Date Noted   Brain metastasis ([place]) 10/11/2020   Lung cancer ([place])    Port-A-Cath in place 11/14/2019   Squamous cell lung cancer, 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 [place], [place], [address] Phone: [phone]   Fax:  [phone]  Name: Alan [name] MRN: [id number] Date of Birth: 1950-02-26

## 2020-12-06 NOTE — Telephone Encounter (Signed)
Patient identified on MST (+wt loss, poor appetite)  Attempted to contact patient to introduce self and services available at [facility]. Patient did not answer. Message left with contact information.

## 2020-12-10 ENCOUNTER — Telehealth (HOSPITAL_COMMUNITY): Payer: Self-pay | Admitting: *Deleted

## 2020-12-10 NOTE — Telephone Encounter (Signed)
Received TC from wife [name] requesting a script for a walker sent to [facility] and will be sent today.

## 2020-12-11 ENCOUNTER — Encounter (HOSPITAL_COMMUNITY): Payer: Self-pay

## 2020-12-11 ENCOUNTER — Ambulatory Visit (HOSPITAL_COMMUNITY): Payer: Medicare Other

## 2020-12-11 ENCOUNTER — Other Ambulatory Visit: Payer: Self-pay

## 2020-12-11 DIAGNOSIS — R262 Difficulty in walking, not elsewhere classified: Secondary | ICD-10-CM

## 2020-12-11 DIAGNOSIS — R2689 Other abnormalities of gait and mobility: Secondary | ICD-10-CM

## 2020-12-11 DIAGNOSIS — M6281 Muscle weakness (generalized): Secondary | ICD-10-CM

## 2020-12-11 NOTE — Therapy (Signed)
[place], [place], [address] Phone: [phone]   Fax:  [phone]  Physical Therapy Treatment  Patient Details  Name: Alan [name] MRN: [id number] Date of Birth: 04-Apr-1950 Referring Provider (PT): Derek Jack, MD   Encounter Date: 12/11/2020   PT End of Session - 12/11/20 0830     Visit Number 2    Number of Visits 12    Date for PT Re-Evaluation 01/17/21    Authorization Type [place]  - visit med [place], secondary medicaid traditional - no auth req with medicare as primary    Progress Note Due on Visit 10    PT Start Time 0827    PT Stop Time 0906    PT Time Calculation (min) 39 min    Activity Tolerance Patient limited by fatigue    Behavior During Therapy [facility] for tasks assessed/performed;Flat affect             Past Medical History:  Diagnosis Date   Anxiety    Arthritis    Dysrhythmia    Hyperlipemia    Hypertension    Hyperthyroidism    Lung cancer ([place])    Myocardial infarction ([place])    Paroxysmal atrial fibrillation ([place])    Port-A-Cath in place 11/14/2019   Tachycardia     Past Surgical History:  Procedure Laterality Date   APPLICATION OF CRANIAL NAVIGATION N/A 10/26/2020   Procedure: APPLICATION OF CRANIAL NAVIGATION;  Surgeon: Consuella Lose, MD;  Location: [facility];  Service: Neurosurgery;  Laterality: N/A;  RM 21   CARDIAC CATHETERIZATION     CRANIOTOMY Right 10/26/2020   Procedure: Stereotactic Right frontal craniotomy for resection of tumor with sonopet;  Surgeon: Consuella Lose, MD;  Location: [facility];  Service: Neurosurgery;  Laterality: Right;   PORTACATH PLACEMENT Right 11/01/2019   Procedure: INSERTION PORT-A-CATH;  Surgeon: Aviva Signs, MD;  [facility];  Service: General;  Laterality: Right;    There were no vitals filed for this visit.                      [facility] Adult PT Treatment/Exercise - 12/11/20 0001       Lumbar  Exercises: Standing   Heel Raises 5 reps    Heel Raises Limitations cueing for posture; 2 sets with HHA    Other Standing Lumbar Exercises hip abd 10x cueing for posture and to reduce ER    Other Standing Lumbar Exercises tandem stance 2x 30" intermittent HHA      Lumbar Exercises: Seated   Long Arc Quad on Chair 10 reps;Both    Sit to Stand 5 reps   2 sets   Sit to Stand Limitations no HHA, eccentric control    Other Seated Lumbar Exercises Educated importance of sitting tall to assist with deep breathing    Other Seated Lumbar Exercises scapular retraction 10x                     PT Education - 12/11/20 0832     Education Details Reviewed goals, educated importance of HEP compliance for maximal benefits, pt able to recall and demonstrate appropriate mechanics with current exercise program    Person(s) Educated Patient;Spouse    Methods Explanation    Comprehension Verbalized understanding;Returned demonstration              PT Short Term Goals - 12/06/20 1046       PT SHORT TERM GOAL #  1   Title Patient will be independent in self management strategies to improve quality of life and functional outcomes.    Time 3    Period Weeks    Status New    Target Date 12/27/20      PT SHORT TERM GOAL #2   Title Patient will be able to transition from Sit to stand 5x < 14 seconds without use of upper extremities    Time 3    Period Weeks    Target Date 12/27/20      PT SHORT TERM GOAL #3   Title Patient will report at least 25% improvement in overall symptoms and/or function to demonstrate improved functional mobility    Time 3    Period Weeks    Status New    Target Date 12/27/20               PT Long Term Goals - 12/06/20 1046       PT LONG TERM GOAL #1   Title Patient will report at least 50% improvement in overall symptoms and/or function to demonstrate improved functional mobility    Time 6    Period Weeks    Status New    Target Date 01/17/21       PT LONG TERM GOAL #2   Title Patient will be score with at least 19/24 on DGI to demonstrate improved dynamic balance    Time 6    Period Weeks    Status New    Target Date 01/17/21      PT LONG TERM GOAL #3   Title Patient will be able to get up off toilet at home without assistance to demonstrate improved transitional mobility    Time 6    Period Weeks    Status New    Target Date 01/17/21                   Plan - 12/11/20 1191     Clinical Impression Statement Reviewed goals, educated importance of HEP compliance for maximal benefits, pt able to recall and demonstrate appriopriate mechanics with current exercise program.  Session focus with functional strengthening and static balance training.  Pt educated on importance of proper sitting posture to assist with breathing.  Pt SOB through session and did require periodic short duration seated rest breaks due to fatigue.  Pt reports his brain is tired.  Reviewed benefits of power naps though encouraged to reduce length and not to complete late in the day to affect sleeping at night.    Personal Factors and Comorbidities Comorbidity 1;Comorbidity 2;Comorbidity 3+    Comorbidities active lung cancer, right frontal craniotomy    Examination-Activity Limitations Bathing;Squat;Stairs;Stand;Locomotion Level;Transfers;Dressing    Examination-Participation Restrictions Meal Prep;Yard Work;Community Activity;Shop;Cleaning    Stability/Clinical Decision Making Evolving/Moderate complexity    Clinical Decision Making Moderate    Rehab Potential Fair    PT Frequency 2x / week    PT Duration 6 weeks    PT Treatment/Interventions ADLs/Self Care Home Management;Canalith Repostioning;Cryotherapy;Electrical Stimulation;Therapeutic exercise;Balance training;Therapeutic activities;Manual techniques;Patient/family education;Neuromuscular re-education;Gait training;DME Instruction    PT Next Visit Plan functional strength and balance    PT  Home Exercise Plan LAQ; 10/25: sitting tall and STS without HHA    Consulted and Agree with Plan of Care Patient;Family member/caregiver    Family Member Consulted wife - [name]             Patient will benefit from skilled therapeutic intervention in order to improve  the following deficits and impairments:  Difficulty walking, Decreased mobility, Decreased balance, Decreased activity tolerance, Decreased endurance, Decreased knowledge of use of DME, Decreased knowledge of precautions, Cardiopulmonary status limiting activity, Decreased range of motion, Postural dysfunction  Visit Diagnosis: Muscle weakness (generalized)  Difficulty in walking, not elsewhere classified  Balance problem     Problem List Patient Active Problem List   Diagnosis Date Noted   Brain metastasis ([place]) 10/11/2020   Lung cancer ([place])    Port-A-Cath in place 11/14/2019   Squamous cell lung cancer, 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

## 2020-12-11 NOTE — Patient Instructions (Signed)
Posture - Sitting    Sit upright, head facing forward. Try using a roll to support lower back. Keep shoulders relaxed, and avoid rounded back.  Keep hips level with knees. Avoid crossing legs for long periods.  Copyright  VHI. All rights reserved.   Knee Extension: Sit to Stand (Eccentric)    Stand close to chair. Slowly lower self to seated position.  10 reps per set, 2 sets per day, 7 days per week. Progress to stopping midway before lowering to chair. Progress to barely touching chair.  Copyright  VHI. All rights reserved.

## 2020-12-13 ENCOUNTER — Other Ambulatory Visit: Payer: Self-pay | Admitting: Radiation Therapy

## 2020-12-13 ENCOUNTER — Encounter (HOSPITAL_COMMUNITY): Payer: Self-pay | Admitting: Radiology

## 2020-12-13 ENCOUNTER — Other Ambulatory Visit: Payer: Self-pay

## 2020-12-13 ENCOUNTER — Ambulatory Visit (HOSPITAL_COMMUNITY)
Admission: RE | Admit: 2020-12-13 | Discharge: 2020-12-13 | Disposition: A | Discharge status: Home / Self Care | Payer: Medicare Other | Source: Ambulatory Visit | Attending: Hematology | Admitting: Hematology

## 2020-12-13 ENCOUNTER — Ambulatory Visit (HOSPITAL_COMMUNITY): Payer: Medicare Other

## 2020-12-13 DIAGNOSIS — C3492 Malignant neoplasm of unspecified part of left bronchus or lung: Secondary | ICD-10-CM | POA: Insufficient documentation

## 2020-12-13 DIAGNOSIS — R2689 Other abnormalities of gait and mobility: Secondary | ICD-10-CM

## 2020-12-13 DIAGNOSIS — M6281 Muscle weakness (generalized): Secondary | ICD-10-CM | POA: Diagnosis not present

## 2020-12-13 DIAGNOSIS — R262 Difficulty in walking, not elsewhere classified: Secondary | ICD-10-CM

## 2020-12-13 MED ORDER — IOHEXOL 300 MG/ML  SOLN
75.0000 mL | Freq: Once | INTRAMUSCULAR | Status: AC | PRN
  Administered 2020-12-13: 75 mL via INTRAVENOUS

## 2020-12-13 NOTE — Therapy (Signed)
[place], [place], [address] Phone: [phone]   Fax:  [phone]  Physical Therapy Treatment  Patient Details  Name: Alan [name] MRN: [id number] Date of Birth: 17-Mar-1950 Referring Provider (PT): Derek Jack, MD   Encounter Date: 12/13/2020   PT End of Session - 12/13/20 1326     Visit Number 3    Number of Visits 12    Date for PT Re-Evaluation 01/17/21    Authorization Type [place]  - visit med [place], secondary medicaid traditional - no auth req with medicare as primary    Progress Note Due on Visit 10    PT Start Time 1316    PT Stop Time 1358    PT Time Calculation (min) 42 min    Activity Tolerance Patient limited by fatigue;Patient tolerated treatment well   SOB, occasional cough   Behavior During Therapy [facility] for tasks assessed/performed;Flat affect             Past Medical History:  Diagnosis Date   Anxiety    Arthritis    Dysrhythmia    Hyperlipemia    Hypertension    Hyperthyroidism    Lung cancer ([place])    Myocardial infarction ([place])    Paroxysmal atrial fibrillation ([place])    Port-A-Cath in place 11/14/2019   Tachycardia     Past Surgical History:  Procedure Laterality Date   APPLICATION OF CRANIAL NAVIGATION N/A 10/26/2020   Procedure: APPLICATION OF CRANIAL NAVIGATION;  Surgeon: Consuella Lose, MD;  Location: [facility];  Service: Neurosurgery;  Laterality: N/A;  RM 21   CARDIAC CATHETERIZATION     CRANIOTOMY Right 10/26/2020   Procedure: Stereotactic Right frontal craniotomy for resection of tumor with sonopet;  Surgeon: Consuella Lose, MD;  Location: [facility];  Service: Neurosurgery;  Laterality: Right;   PORTACATH PLACEMENT Right 11/01/2019   Procedure: INSERTION PORT-A-CATH;  Surgeon: Aviva Signs, MD;  [facility];  Service: General;  Laterality: Right;    There were no vitals filed for this visit.   Subjective Assessment - 12/13/20 1320     Subjective  Pt stated he is feeling good today, no reports of pain currently.  Reports compliance with HEP daily.    Pertinent History right frontal craniotomy, lung cancer    Patient Stated Goals want to be able to get up/off toilet by himself and be able to walk good.    Currently in Pain? No/denies                               [facility] Adult PT Treatment/Exercise - 12/13/20 0001       Lumbar Exercises: Standing   Heel Raises 10 reps    Heel Raises Limitations cueing for posture; 2 sets with HHA    Other Standing Lumbar Exercises hip abd 10x cueing for posture and to reduce ER; toe tapping 6in step 3x10 last set without HHA    Other Standing Lumbar Exercises sidestep front of mat 5RT no AD; tandem stance 2x 20"      Lumbar Exercises: Seated   Sit to Stand 10 reps    Sit to Stand Limitations no HHA, eccentric control    Other Seated Lumbar Exercises Postural education    Other Seated Lumbar Exercises scapular retraction w/RTB 20x  PT Short Term Goals - 12/06/20 1046       PT SHORT TERM GOAL #1   Title Patient will be independent in self management strategies to improve quality of life and functional outcomes.    Time 3    Period Weeks    Status New    Target Date 12/27/20      PT SHORT TERM GOAL #2   Title Patient will be able to transition from Sit to stand 5x < 14 seconds without use of upper extremities    Time 3    Period Weeks    Target Date 12/27/20      PT SHORT TERM GOAL #3   Title Patient will report at least 25% improvement in overall symptoms and/or function to demonstrate improved functional mobility    Time 3    Period Weeks    Status New    Target Date 12/27/20               PT Long Term Goals - 12/06/20 1046       PT LONG TERM GOAL #1   Title Patient will report at least 50% improvement in overall symptoms and/or function to demonstrate improved functional mobility    Time 6    Period Weeks    Status  New    Target Date 01/17/21      PT LONG TERM GOAL #2   Title Patient will be score with at least 19/24 on DGI to demonstrate improved dynamic balance    Time 6    Period Weeks    Status New    Target Date 01/17/21      PT LONG TERM GOAL #3   Title Patient will be able to get up off toilet at home without assistance to demonstrate improved transitional mobility    Time 6    Period Weeks    Status New    Target Date 01/17/21                   Plan - 12/13/20 1545     Clinical Impression Statement Added standing hip strengthening and balance associated activities.  Pt required periodic seated rest breaks due to fatigue, reviewed importance of good seated posture to assist iwht breathing.  Added seated rows with theraband for postural strengthening.  Added sidestep infront of counter at home and postural strenghtneing to HEP with printout given and verbalized understanding to complete all exercises safely.    Personal Factors and Comorbidities Comorbidity 1;Comorbidity 2;Comorbidity 3+    Comorbidities active lung cancer, right frontal craniotomy    Examination-Activity Limitations Bathing;Squat;Stairs;Stand;Locomotion Level;Transfers;Dressing    Examination-Participation Restrictions Meal Prep;Yard Work;Community Activity;Shop;Cleaning    Stability/Clinical Decision Making Evolving/Moderate complexity    Clinical Decision Making Moderate    Rehab Potential Fair    PT Frequency 2x / week    PT Duration 6 weeks    PT Treatment/Interventions ADLs/Self Care Home Management;Canalith Repostioning;Cryotherapy;Electrical Stimulation;Therapeutic exercise;Balance training;Therapeutic activities;Manual techniques;Patient/family education;Neuromuscular re-education;Gait training;DME Instruction    PT Next Visit Plan functional strength and balance    PT Home Exercise Plan LAQ; 10/25: sitting tall and STS without HHA; 10/27: sidestep infront of counter at home and seated rows with RTB.     Consulted and Agree with Plan of Care Patient;Family member/caregiver    Family Member Consulted wife - [name]             Patient will benefit from skilled therapeutic intervention in order to improve the following deficits  and impairments:  Difficulty walking, Decreased mobility, Decreased balance, Decreased activity tolerance, Decreased endurance, Decreased knowledge of use of DME, Decreased knowledge of precautions, Cardiopulmonary status limiting activity, Decreased range of motion, Postural dysfunction  Visit Diagnosis: Muscle weakness (generalized)  Balance problem  Difficulty in walking, not elsewhere classified     Problem List Patient Active Problem List   Diagnosis Date Noted   Brain metastasis ([place]) 10/11/2020   Lung cancer ([place])    Port-A-Cath in place 11/14/2019   Squamous cell lung cancer, 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

## 2020-12-18 ENCOUNTER — Other Ambulatory Visit: Payer: Self-pay

## 2020-12-18 ENCOUNTER — Ambulatory Visit (HOSPITAL_COMMUNITY): Payer: Medicare Other | Attending: Hematology | Admitting: Physical Therapy

## 2020-12-18 DIAGNOSIS — M6281 Muscle weakness (generalized): Secondary | ICD-10-CM | POA: Insufficient documentation

## 2020-12-18 DIAGNOSIS — R262 Difficulty in walking, not elsewhere classified: Secondary | ICD-10-CM | POA: Insufficient documentation

## 2020-12-18 DIAGNOSIS — R2689 Other abnormalities of gait and mobility: Secondary | ICD-10-CM | POA: Insufficient documentation

## 2020-12-18 NOTE — Progress Notes (Signed)
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  PRIOR THERAPY: Concurrent chemoradiation with weekly carboplatin and paclitaxel x 6 cycles from 11/28/2019 to 01/02/2020  NGS Results: not done  CURRENT THERAPY: Taxotere and ramucirumab every 3 weeks  BRIEF ONCOLOGIC HISTORY:  Oncology History  Squamous cell lung cancer, left ([facility])  11/08/2019 Initial Diagnosis   Squamous cell lung cancer, left ([facility])   11/08/2019 Cancer Staging   Staging form: Lung, AJCC 8th Edition - Clinical stage from 11/08/2019: Stage IIIB (cT4, cN2, cM0) - Signed by Derek Jack, MD on 11/08/2019    11/28/2019 - 01/02/2020 Chemotherapy   The patient had palonosetron (ALOXI) injection 0.25 mg, 0.25 mg, Intravenous,  Once, 6 of 6 cycles Administration: 0.25 mg (11/28/2019), 0.25 mg (12/05/2019), 0.25 mg (12/12/2019), 0.25 mg (12/19/2019), 0.25 mg (12/26/2019), 0.25 mg (01/02/2020) CARBOplatin (PARAPLATIN) 180 mg in sodium chloride 0.9 % 250 mL chemo infusion, 180 mg (100 % of original dose 176 mg), Intravenous,  Once, 6 of 6 cycles Dose modification:   (original dose 176 mg, Cycle 1),   (original dose 201.2 mg, Cycle 2),   (original dose 179.2 mg, Cycle 3),   (original dose 179.2 mg, Cycle 4),   (original dose 195.4 mg, Cycle 5),   (original dose 178.2 mg, Cycle 6) Administration: 180 mg (11/28/2019), 200 mg (12/05/2019), 180 mg (12/12/2019), 180 mg (12/19/2019), 190 mg (12/26/2019), 180 mg (01/02/2020) PACLitaxel (TAXOL) 84 mg in sodium chloride 0.9 % 250 mL chemo infusion (</= 80mg /m2), 45 mg/m2 = 84 mg, Intravenous,  Once, 6 of 6 cycles Administration: 84 mg (11/28/2019), 84 mg (12/05/2019), 84 mg (12/12/2019), 84 mg (12/19/2019), 84 mg (12/26/2019), 84 mg (01/02/2020)   for chemotherapy  treatment.     01/30/2020 - 09/11/2020 Chemotherapy          10/03/2020 -  Chemotherapy   Patient is on Treatment Plan : LUNG Docetaxel + Ramucirumab q21d        CANCER STAGING: Cancer Staging Squamous cell lung cancer, left ([place]) Staging form: Lung, AJCC 8th Edition - Clinical stage from 11/08/2019: Stage IIIB (cT4, cN2, cM0) - Signed by Derek Jack, MD on 11/08/2019   INTERVAL HISTORY:  Alan [name], a 70 y.o. male, returns for routine follow-up and consideration for next cycle of chemotherapy. Alan [name] was last seen on 11/28/2020.  Due for cycle #2 of Taxotere and ramucirumab today.   Overall, he tells me he has been feeling pretty well. He reports decreased fatigue and increased appetite, and he gained 6 pounds. He is able to walk with the assistance of a walker. He reports occasional tingling/numbness in his hands and feet. His cough has improved with hydrocodone, and he reports continued SOB. He denies hemoptysis, nausea, and vomiting.   Overall, he feels ready for next cycle of chemo today.   REVIEW OF SYSTEMS:  Review of Systems  Constitutional:  Negative for appetite change (75%), fatigue (65%) and unexpected weight change (+ 6 lbs).  Respiratory:  Positive for cough (improved) and shortness of breath. Negative for hemoptysis.   Gastrointestinal:  Negative for nausea and vomiting.  Neurological:  Positive for numbness.  Psychiatric/Behavioral:  Positive for sleep disturbance.   All other systems reviewed and are negative.  PAST MEDICAL/SURGICAL HISTORY:  Past Medical History:  Diagnosis Date  Anxiety    Arthritis    Dysrhythmia    Hyperlipemia    Hypertension    Hyperthyroidism    Lung cancer (HCC)    Myocardial infarction ([facility])    Paroxysmal atrial fibrillation ([place])    Port-A-Cath in place 11/14/2019   Tachycardia    Past Surgical History:  Procedure Laterality Date   APPLICATION OF CRANIAL NAVIGATION N/A 10/26/2020   Procedure: APPLICATION  OF CRANIAL NAVIGATION;  Surgeon: Consuella Lose, MD;  Location: [facility];  Service: Neurosurgery;  Laterality: N/A;  RM 21   CARDIAC CATHETERIZATION     CRANIOTOMY Right 10/26/2020   Procedure: Stereotactic Right frontal craniotomy for resection of tumor with sonopet;  Surgeon: Consuella Lose, MD;  Location: [facility];  Service: Neurosurgery;  Laterality: Right;   PORTACATH PLACEMENT Right 11/01/2019   Procedure: INSERTION PORT-A-CATH;  Surgeon: Aviva Signs, MD;  Location: [facility];  Service: General;  Laterality: Right;    SOCIAL HISTORY:  Social History   Socioeconomic History   Marital status: Married    Spouse name: Not on file   Number of children: 1   Years of education: Not on file   Highest education level: Not on file  Occupational History   Occupation: retired   Occupation: Glass blower/designer: [employer]  Tobacco Use   Smoking status: Former    Years: 45.00    Types: Cigars, Cigarettes    Quit date: 10/19/2014    Years since quitting: 6.1   Smokeless tobacco: Never  Vaping Use   Vaping Use: Never used  Substance and Sexual Activity   Alcohol use: Not Currently    Comment: occ   Drug use: No   Sexual activity: Not on file  Other Topics Concern   Not on file  Social History Narrative   Not on file   Social Determinants of Health   Financial Resource Strain: Not on file  Food Insecurity: Not on file  Transportation Needs: Not on file  Physical Activity: Not on file  Stress: Not on file  Social Connections: Not on file  Intimate Partner Violence: Not on file    FAMILY HISTORY:  Family History  Problem Relation Age of Onset   Hypertension Father    Dementia Father        died age 57   Heart disease Father    Heart failure Other     CURRENT MEDICATIONS:  Current Outpatient Medications  Medication Sig Dispense Refill   acetaminophen (TYLENOL) 500 MG tablet Take 500 mg by mouth every 6 (six) hours as needed for moderate pain or headache.     ALPRAZolam  (XANAX) 0.25 MG tablet TAKE 1 TABLET BY MOUTH THREE TIMES DAILY AS NEEDED FOR ANXIETY 30 tablet 0   apixaban (ELIQUIS) 5 MG TABS tablet Take 1 tablet by mouth twice daily 60 tablet 6   dextromethorphan (DELSYM) 30 MG/5ML liquid Take 30 mg by mouth as needed for cough.     Durvalumab (IMFINZI IV) Inject into the vein.     HYDROcodone bit-homatropine (HYCODAN) 5-1.5 MG/5ML syrup Take 15 mLs by mouth every 8 (eight) hours. 473 mL 0   levothyroxine (SYNTHROID) 100 MCG tablet Take 1 tablet (100 mcg total) by mouth daily before breakfast. 30 tablet 3   lidocaine (XYLOCAINE) 2 % solution TAKE 15 ML BY MOUTH 4 TIMES DAILY WITH MEALS 480 mL 0   megestrol (MEGACE) 400 MG/10ML suspension Take 10 mLs (400 mg total) by mouth 2 (two) times daily. [place]  mL 1   Melatonin 10 MG TABS Take 10 tablets by mouth at bedtime as needed (sleep).     methylphenidate (RITALIN) 5 MG tablet Take 1 tablet (5 mg total) by mouth daily. 30 tablet 0   metoprolol tartrate (LOPRESSOR) 25 MG tablet Take 1 tablet (25 mg total) by mouth 2 (two) times daily. 180 tablet 3   lidocaine-prilocaine (EMLA) cream APPLY SMALL AMOUNT TO PORTA-CATH SITE AND COVER WITH PLASTIC WRAP 1 HOUR PRIOR TO CHEMO APPOINTMENTS. (Patient not taking: Reported on 12/19/2020) 30 g 6   No current facility-administered medications for this visit.    ALLERGIES:  No Known Allergies  PHYSICAL EXAM:  Performance status (ECOG): 1 - Symptomatic but completely ambulatory  Vitals:   12/19/20 0928  BP: 113/81  Pulse: 93  Resp: 18  Temp: (!) 97.4 F (36.3 C)  SpO2: 100%   Wt Readings from Last 3 Encounters:  12/19/20 145 lb 3.2 oz (65.9 kg)  11/28/20 139 lb 12.8 oz (63.4 kg)  11/27/20 140 lb 8 oz (63.7 kg)   Physical Exam Vitals reviewed.  Constitutional:      Appearance: Normal appearance.  Cardiovascular:     Rate and Rhythm: Normal rate and regular rhythm.     Pulses: Normal pulses.     Heart sounds: Normal heart sounds.  Pulmonary:     Effort:  Pulmonary effort is normal.     Breath sounds: Normal breath sounds.  Musculoskeletal:     Right lower leg: No edema.     Left lower leg: No edema.  Neurological:     General: No focal deficit present.     Mental Status: He is alert and oriented to person, place, and time.  Psychiatric:        Mood and Affect: Mood normal.        Behavior: Behavior normal.    LABORATORY DATA:  I have reviewed the labs as listed.  CBC Latest Ref Rng & Units 12/19/2020 11/28/2020 11/14/2020  WBC 4.0 - 10.5 K/uL 11.1(H) 9.4 9.4  Hemoglobin 13.0 - 17.0 g/dL 10.5(L) 11.7(L) 12.2(L)  Hematocrit 39.0 - 52.0 % 33.2(L) 35.5(L) 36.6(L)  Platelets 150 - 400 K/uL 212 190 221   CMP Latest Ref Rng & Units 11/28/2020 11/14/2020 10/30/2020  Glucose 70 - 99 mg/dL 103(H) 196(H) 204(H)  BUN 8 - 23 mg/dL 19 18 21   Creatinine 0.61 - 1.24 mg/dL 1.18 1.10 0.84  Sodium 135 - 145 mmol/L 134(L) 131(L) 131(L)  Potassium 3.5 - 5.1 mmol/L 4.1 3.7 4.8  Chloride 98 - 111 mmol/L 103 97(L) 97(L)  CO2 22 - 32 mmol/L 25 19(L) 26  Calcium 8.9 - 10.3 mg/dL 8.4(L) 8.4(L) 8.0(L)  Total Protein 6.5 - 8.1 g/dL 6.4(L) 6.6 -  Total Bilirubin 0.3 - 1.2 mg/dL 1.1 0.9 -  Alkaline Phos 38 - 126 U/L 93 75 -  AST 15 - 41 U/L 27 30 -  ALT 0 - 44 U/L 25 18 -    DIAGNOSTIC IMAGING:  I have independently reviewed the scans and discussed with the patient. CT Chest W Contrast  Result Date: 12/15/2020 CLINICAL DATA:  Follow up left upper lobe squamous cell lung cancer EXAM: CT CHEST WITH CONTRAST TECHNIQUE: Multidetector CT imaging of the chest was performed during intravenous contrast administration. CONTRAST:  1mL OMNIPAQUE IOHEXOL 300 MG/ML  SOLN COMPARISON:  PET/CT, 09/20/2020, CT chest abdomen pelvis, 09/05/2020 FINDINGS: Cardiovascular: Right chest port catheter. Aortic atherosclerosis. Normal heart size. Three vessel coronary artery calcifications. No pericardial effusion.  Mediastinum/Nodes: Previously FDG avid soft tissue nodule in the AP  window is slightly enlarged, measuring 3.2 x 2.3 cm, previously 2.4 x 1.5 cm when measured similarly (series 2, image 47). Additional previously FDG avid left hilar nodule is slightly enlarged, measuring 1.9 x 1.8 cm, previously 1.6 x 1.4 cm (series 2, image 60). Thyroid gland, trachea, and esophagus demonstrate no significant findings. Lungs/Pleura: Interval resolution of previously seen left-sided pneumothorax component, with small, residual loculated pleural effusion components. Otherwise unchanged post treatment/post radiation appearance of the left lung, with extensive perihilar and paramedian left upper lobe fibrosis and consolidation. Unchanged 0.8 cm subpleural nodule of the left lower lobe, previously hypermetabolic. There is new, irregular, scattered ground-glass opacity throughout the right lung (series 4, image 59). Diffuse bilateral bronchial wall thickening. Upper Abdomen: No acute abnormality. Musculoskeletal: No chest wall mass or suspicious bone lesions identified. IMPRESSION: 1. Previously FDG avid soft tissue nodule in the AP window is slightly enlarged, measuring 3.2 x 2.3 cm, previously 2.4 x 1.5 cm when measured similarly. 2. Additional previously FDG avid left hilar nodule is slightly enlarged, measuring 1.9 x 1.8 cm, previously 1.6 x 1.4 cm. 3. Findings are consistent with worsened metastatic disease. 4. Unchanged 0.8 cm subpleural nodule of the left lower lobe, previously hypermetabolic and consistent with a small metastasis. 5. Interval resolution of previously seen left-sided pneumothorax component, with small, residual loculated left pleural effusion components. 6. Otherwise unchanged post treatment/post radiation appearance of the left lung, with extensive perihilar and paramedian left upper lobe fibrosis and consolidation. 7. There is new, irregular, scattered ground-glass opacity throughout the right lung, nonspecific and infectious or inflammatory. Differential considerations include  drug toxicity in the setting of chemotherapy. 8. Coronary artery disease. Aortic Atherosclerosis (ICD10-I70.0). Electronically Signed   By: Delanna Ahmadi M.D.   On: 12/15/2020 09:36     ASSESSMENT:  1.  Left lung cancer: -Presentation to the ER on 10/06/2019 with cough and dizziness. -CT chest with contrast on 10/06/2019 showed large left suprahilar mass completely obstructing left upper lobe bronchus, measuring 7 x 5.3 x 5.8 cm.  Mass extends into AP window and narrows the left mainstem bronchus.  Small subpleural nodule in the left lower lobe measuring 4 mm indeterminate.  AP window nodal mass measuring 3.4 cm.  No supraclavicular adenopathy.  Subcarinal enlarged lymph node measures 1.4 cm. -PET scan on 10/14/2019 shows large hypermetabolic left upper lobe mass surrounding the left hilum, extending into pleural surface not invading the chest wall.  Direct invasion into the AP window and metastatic subcarinal lymph node, T4 N2 M0.  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 -10 pound weight loss in the last 6 months.  Hoarseness for last 1 week. -MRI of the brain on 10/28/2019 with no evidence of metastasis. -Concurrent chemoradiation therapy started on 11/28/2019. Completed 01/02/20 ( chemo) 01/06/2020(radiation) -CT chest on 01/20/2020 showed significant interval decrease in consolidation of the left upper lobe, interval decrease in irregular nodularity and consolidation of the left lower lobe.  Unchanged posttreatment appearance of soft tissue about the left hilum and AP window.  No discretely enlarged mediastinal or hilar lymph nodes.  Trace left pleural effusion, decreased compared to prior exam. -Consolidation durvalumab started on 01/30/2020. -CT chest with contrast on 03/30/2020 with the development of small left-sided pleural effusion with further volume loss.  Bandlike scarring in the left lower lobe since prior study likely posttreatment  changes.  Small bone lesion at T1  stable.  1 small nodule in the right chest which is new about 4 mm.  Will monitor.   2.  Social/family history: -He smoked 1 pack/day for 45 years, quit 5 years ago. -No family history of malignancies.   PLAN:  1.  Advanced squamous cell carcinoma of the left lung: - Cycle 1 of docetaxel on 10/03/2020. - His performance status has improved in the last 2 weeks.  He is more active and has more better days than not.  Denied any nausea or vomiting.  No hemoptysis. - Reviewed CT chest with contrast from 12/13/2020 which showed increase in size of the AP window lymph node measuring 3.2 x 2.3 cm.  Left hilar nodule also increased in size to 1.9 x 1.8 cm. - We talked about restarting therapy versus best supportive care in the form of hospice.  Patient would like to give it a shot with treatment again. - We reviewed his labs.  LFTs are normal.  CBC was grossly normal. - He will proceed with cycle 2 docetaxel at 75 mg/m2.  I will hold off on ramucirumab at this time.  If he tolerates well, will consider with cycle 3. - RTC 4 weeks for follow-up with repeat labs and treatment.   2.  Hypomagnesemia: - Magnesium today is 1.5.  He will receive magnesium 2 g IV.  If it continues to be low, will consider outpatient supplements.   3.  Atrial fibrillation: - Continue Eliquis 5 mg twice daily.  No bleeding issues.  He is off of digoxin.   4.  Weight loss: - Continue Megace 400 mg twice daily. - He gained about 6 pounds since last visit.  He is eating better.   5.  Hypertension: - Continue metoprolol.  Blood pressure today is 113/81.   6.  Hypothyroidism: - Continue Synthroid.  Last TSH 0.213.  7.  Cough: - Continue hydrocodone syrup every 8 hours.  8.  Right frontal lobe mass: - Stereotactic right frontal craniotomy for resection of tumor on 10/26/2020. - He has been tapered off of Keppra and dexamethasone.  No headaches reported.   Orders placed this encounter:   No orders of the defined types were placed in this encounter.    Derek Jack, MD [place] [phone]   I, Thana Ates, am acting as a scribe for Dr. Derek Jack.  I, Derek Jack MD, have reviewed the above documentation for accuracy and completeness, and I agree with the above.

## 2020-12-18 NOTE — Therapy (Signed)
[place], [place], [address] Phone: [phone]   Fax:  [phone]  Physical Therapy Treatment  Patient Details  Name: Alan [name] MRN: [id number] Date of Birth: 12-24-1950 Referring Provider (PT): Derek Jack, MD   Encounter Date: 12/18/2020   PT End of Session - 12/18/20 1446     Visit Number 4    Number of Visits 12    Date for PT Re-Evaluation 01/17/21    Authorization Type [place]  - visit med [place], secondary medicaid traditional - no auth req with medicare as primary    Progress Note Due on Visit 10    PT Start Time 1404    PT Stop Time 1445    PT Time Calculation (min) 41 min    Activity Tolerance Patient limited by fatigue;Patient tolerated treatment well   SOB, occasional cough   Behavior During Therapy [facility] for tasks assessed/performed;Flat affect             Past Medical History:  Diagnosis Date   Anxiety    Arthritis    Dysrhythmia    Hyperlipemia    Hypertension    Hyperthyroidism    Lung cancer ([facility])    Myocardial infarction ([facility])    Paroxysmal atrial fibrillation ([facility])    Port-A-Cath in place 11/14/2019   Tachycardia     Past Surgical History:  Procedure Laterality Date   APPLICATION OF CRANIAL NAVIGATION N/A 10/26/2020   Procedure: APPLICATION OF CRANIAL NAVIGATION;  Surgeon: Consuella Lose, MD;  Location: [facility];  Service: Neurosurgery;  Laterality: N/A;  RM 21   CARDIAC CATHETERIZATION     CRANIOTOMY Right 10/26/2020   Procedure: Stereotactic Right frontal craniotomy for resection of tumor with sonopet;  Surgeon: Consuella Lose, MD;  Location: [facility];  Service: Neurosurgery;  Laterality: Right;   PORTACATH PLACEMENT Right 11/01/2019   Procedure: INSERTION PORT-A-CATH;  Surgeon: Aviva Signs, MD;  Location: [facility];  Service: General;  Laterality: Right;    There were no vitals filed for this visit.   Subjective Assessment - 12/18/20 1411     Subjective pt  without complaints however with noted shortness of breath upon entrance.    Patient is accompained by: Family member                               [facility] Adult PT Treatment/Exercise - 12/18/20 0001       Lumbar Exercises: Standing   Heel Raises 20 reps    Heel Raises Limitations cueing for posture; 2 sets with HHA    Forward Lunge 10 reps    Forward Lunge Limitations onto 4" step no UE's    Other Standing Lumbar Exercises hip abd 10x2 sets cueing for posture and to reduce ER; toe tapping 6in step 3x10 last set without HHA; 4" forward step ups with 1 HHA 10X each with rest between     Other Standing Lumbar Exercises sidestepping in parallel bars no UE 5RT      Lumbar Exercises: Seated   Sit to Stand 10 reps    Sit to Stand Limitations no HHA, eccentric control    Other Seated Lumbar Exercises scapular retraction w/RTB 20x                       PT Short Term Goals - 12/06/20 1046       PT SHORT TERM GOAL #1  Title Patient will be independent in self management strategies to improve quality of life and functional outcomes.    Time 3    Period Weeks    Status New    Target Date 12/27/20      PT SHORT TERM GOAL #2   Title Patient will be able to transition from Sit to stand 5x < 14 seconds without use of upper extremities    Time 3    Period Weeks    Target Date 12/27/20      PT SHORT TERM GOAL #3   Title Patient will report at least 25% improvement in overall symptoms and/or function to demonstrate improved functional mobility    Time 3    Period Weeks    Status New    Target Date 12/27/20               PT Long Term Goals - 12/06/20 1046       PT LONG TERM GOAL #1   Title Patient will report at least 50% improvement in overall symptoms and/or function to demonstrate improved functional mobility    Time 6    Period Weeks    Status New    Target Date 01/17/21      PT LONG TERM GOAL #2   Title Patient will be score with at  least 19/24 on DGI to demonstrate improved dynamic balance    Time 6    Period Weeks    Status New    Target Date 01/17/21      PT LONG TERM GOAL #3   Title Patient will be able to get up off toilet at home without assistance to demonstrate improved transitional mobility    Time 6    Period Weeks    Status New    Target Date 01/17/21                   Plan - 12/18/20 1550     Clinical Impression Statement Began with standing activities at parallel bars first.  Pt fatigues quickly with increased SOB requiring rest breaks after each set of therex on each LE.  VC's needed for proper breathing as well as max tactile and verbal cues for posture while sitting and while completing activities.  Added lunges onto 4" surface without UE to work on single leg stability and forward step ups for strengthening.  Step ups most tiring for patient requiring several standing rest breaks to complete 10 reps on each LE.    Personal Factors and Comorbidities Comorbidity 1;Comorbidity 2;Comorbidity 3+    Comorbidities active lung cancer, right frontal craniotomy    Examination-Activity Limitations Bathing;Squat;Stairs;Stand;Locomotion Level;Transfers;Dressing    Examination-Participation Restrictions Meal Prep;Yard Work;Community Activity;Shop;Cleaning    Stability/Clinical Decision Making Evolving/Moderate complexity    Rehab Potential Fair    PT Frequency 2x / week    PT Duration 6 weeks    PT Treatment/Interventions ADLs/Self Care Home Management;Canalith Repostioning;Cryotherapy;Electrical Stimulation;Therapeutic exercise;Balance training;Therapeutic activities;Manual techniques;Patient/family education;Neuromuscular re-education;Gait training;DME Instruction    PT Next Visit Plan continue to progress functional strength and balance.  Continue to educate on posture and breathing    PT Home Exercise Plan LAQ; 10/25: sitting tall and STS without HHA; 10/27: sidestep infront of counter at home and  seated rows with RTB.    Consulted and Agree with Plan of Care Patient;Family member/caregiver    Family Member Consulted wife - [name]             Patient will benefit from  skilled therapeutic intervention in order to improve the following deficits and impairments:  Difficulty walking, Decreased mobility, Decreased balance, Decreased activity tolerance, Decreased endurance, Decreased knowledge of use of DME, Decreased knowledge of precautions, Cardiopulmonary status limiting activity, Decreased range of motion, Postural dysfunction  Visit Diagnosis: Muscle weakness (generalized)  Difficulty in walking, not elsewhere classified  Balance problem     Problem List Patient Active Problem List   Diagnosis Date Noted   Brain metastasis ([place]) 10/11/2020   Lung cancer ([facility])    Port-A-Cath in place 11/14/2019   Squamous cell lung cancer, left ([facility]) 11/08/2019   Malignant neoplasm of left lung ([facility])    Mass of upper lobe of left lung 10/19/2019   Atrial fibrillation with rapid ventricular response ([place] [facility]) 12/25/2010   Hyperlipidemia 12/25/2010   Hyperglycemia 12/25/2010   Noncompliance with medications 12/25/2010   Tobacco abuse 12/25/2010   Teena Irani, PTA/CLT, WTA [phone]  Teena Irani, PTA 12/18/2020, 3:52 PM  [facility] [address] [place], [place], [address] Phone: [phone]   Fax:  [phone]  Name: Alan [name] MRN: [id number] Date of Birth: Oct 07, 1950

## 2020-12-19 ENCOUNTER — Inpatient Hospital Stay (HOSPITAL_BASED_OUTPATIENT_CLINIC_OR_DEPARTMENT_OTHER): Payer: Medicare Other | Admitting: Hematology

## 2020-12-19 ENCOUNTER — Inpatient Hospital Stay (HOSPITAL_COMMUNITY): Payer: Medicare Other

## 2020-12-19 ENCOUNTER — Inpatient Hospital Stay (HOSPITAL_COMMUNITY): Payer: Medicare Other | Attending: Hematology and Oncology

## 2020-12-19 VITALS — BP 107/90 | HR 90 | Temp 98.3°F | Resp 20

## 2020-12-19 VITALS — BP 113/81 | HR 93 | Temp 97.4°F | Resp 18 | Wt 145.2 lb

## 2020-12-19 DIAGNOSIS — Z95828 Presence of other vascular implants and grafts: Secondary | ICD-10-CM

## 2020-12-19 DIAGNOSIS — Z5111 Encounter for antineoplastic chemotherapy: Secondary | ICD-10-CM | POA: Insufficient documentation

## 2020-12-19 DIAGNOSIS — C3492 Malignant neoplasm of unspecified part of left bronchus or lung: Secondary | ICD-10-CM | POA: Diagnosis not present

## 2020-12-19 DIAGNOSIS — C3412 Malignant neoplasm of upper lobe, left bronchus or lung: Secondary | ICD-10-CM | POA: Diagnosis present

## 2020-12-19 DIAGNOSIS — E039 Hypothyroidism, unspecified: Secondary | ICD-10-CM | POA: Insufficient documentation

## 2020-12-19 DIAGNOSIS — R634 Abnormal weight loss: Secondary | ICD-10-CM | POA: Insufficient documentation

## 2020-12-19 DIAGNOSIS — I10 Essential (primary) hypertension: Secondary | ICD-10-CM | POA: Insufficient documentation

## 2020-12-19 LAB — COMPREHENSIVE METABOLIC PANEL
ALT: 22 U/L (ref 0–44)
AST: 18 U/L (ref 15–41)
Albumin: 2.8 g/dL — ABNORMAL LOW (ref 3.5–5.0)
Alkaline Phosphatase: 59 U/L (ref 38–126)
Anion gap: 8 (ref 5–15)
BUN: 25 mg/dL — ABNORMAL HIGH (ref 8–23)
CO2: 23 mmol/L (ref 22–32)
Calcium: 8.6 mg/dL — ABNORMAL LOW (ref 8.9–10.3)
Chloride: 105 mmol/L (ref 98–111)
Creatinine, Ser: 1.02 mg/dL (ref 0.61–1.24)
GFR, Estimated: >60 mL/min (ref >60)
Glucose, Bld: 192 mg/dL — ABNORMAL HIGH (ref 70–99)
Potassium: 4.5 mmol/L (ref 3.5–5.1)
Sodium: 136 mmol/L (ref 135–145)
Total Bilirubin: 0.5 mg/dL (ref 0.3–1.2)
Total Protein: 6.3 g/dL — ABNORMAL LOW (ref 6.5–8.1)

## 2020-12-19 LAB — URINALYSIS, DIPSTICK ONLY
Bilirubin Urine: NEGATIVE
Glucose, UA: NEGATIVE mg/dL
Hgb urine dipstick: NEGATIVE
Ketones, ur: 5 mg/dL — AB
Leukocytes,Ua: NEGATIVE
Nitrite: NEGATIVE
Protein, ur: 30 mg/dL — AB
Specific Gravity, Urine: 1.03 (ref 1.005–1.030)
pH: 5 (ref 5.0–8.0)

## 2020-12-19 LAB — CBC WITH DIFFERENTIAL/PLATELET
Abs Immature Granulocytes: 0.27 10*3/uL — ABNORMAL HIGH (ref 0.00–0.07)
Basophils Absolute: 0.1 10*3/uL (ref 0.0–0.1)
Basophils Relative: 1 %
Eosinophils Absolute: 0.1 10*3/uL (ref 0.0–0.5)
Eosinophils Relative: 1 %
HCT: 33.2 % — ABNORMAL LOW (ref 39.0–52.0)
Hemoglobin: 10.5 g/dL — ABNORMAL LOW (ref 13.0–17.0)
Immature Granulocytes: 2 %
Lymphocytes Relative: 10 %
Lymphs Abs: 1.1 10*3/uL (ref 0.7–4.0)
MCH: 31.2 pg (ref 26.0–34.0)
MCHC: 31.6 g/dL (ref 30.0–36.0)
MCV: 98.5 fL (ref 80.0–100.0)
Monocytes Absolute: 0.6 10*3/uL (ref 0.1–1.0)
Monocytes Relative: 6 %
Neutro Abs: 8.9 10*3/uL — ABNORMAL HIGH (ref 1.7–7.7)
Neutrophils Relative %: 80 %
Platelets: 212 10*3/uL (ref 150–400)
RBC: 3.37 MIL/uL — ABNORMAL LOW (ref 4.22–5.81)
RDW: 16.3 % — ABNORMAL HIGH (ref 11.5–15.5)
WBC: 11.1 10*3/uL — ABNORMAL HIGH (ref 4.0–10.5)
nRBC: 0 % (ref 0.0–0.2)

## 2020-12-19 LAB — TSH: TSH: 0.213 u[IU]/mL — ABNORMAL LOW (ref 0.350–4.500)

## 2020-12-19 LAB — MAGNESIUM: Magnesium: 1.5 mg/dL — ABNORMAL LOW (ref 1.7–2.4)

## 2020-12-19 MED ORDER — SODIUM CHLORIDE 0.9 % IV SOLN
75.0000 mg/m2 | Freq: Once | INTRAVENOUS | Status: AC
  Administered 2020-12-19: 140 mg via INTRAVENOUS
  Filled 2020-12-19: qty 14

## 2020-12-19 MED ORDER — SODIUM CHLORIDE 0.9 % IV SOLN: Freq: Once | INTRAVENOUS | Status: AC

## 2020-12-19 MED ORDER — SODIUM CHLORIDE 0.9 % IV SOLN
10.0000 mg | Freq: Once | INTRAVENOUS | Status: AC
  Administered 2020-12-19: 10 mg via INTRAVENOUS
  Filled 2020-12-19: qty 10

## 2020-12-19 MED ORDER — HEPARIN SOD (PORK) LOCK FLUSH 100 UNIT/ML IV SOLN
500.0000 [IU] | Freq: Once | INTRAVENOUS | Status: AC | PRN
  Administered 2020-12-19: 500 [IU]

## 2020-12-19 MED ORDER — SODIUM CHLORIDE 0.9% FLUSH
10.0000 mL | INTRAVENOUS | Status: DC | PRN
  Administered 2020-12-19: 10 mL

## 2020-12-19 MED ORDER — MAGNESIUM SULFATE 2 GM/50ML IV SOLN
2.0000 g | Freq: Once | INTRAVENOUS | Status: AC
  Administered 2020-12-19: 2 g via INTRAVENOUS
  Filled 2020-12-19: qty 50

## 2020-12-19 MED ORDER — ACETAMINOPHEN 325 MG PO TABS
650.0000 mg | ORAL_TABLET | Freq: Once | ORAL | Status: AC
  Administered 2020-12-19: 650 mg via ORAL
  Filled 2020-12-19: qty 2

## 2020-12-19 MED ORDER — DIPHENHYDRAMINE HCL 50 MG/ML IJ SOLN
25.0000 mg | Freq: Once | INTRAMUSCULAR | Status: AC
  Administered 2020-12-19: 25 mg via INTRAVENOUS
  Filled 2020-12-19: qty 1

## 2020-12-19 MED ORDER — DIPHENHYDRAMINE HCL 50 MG/ML IJ SOLN: 50.0000 mg | Freq: Once | INTRAMUSCULAR | Status: DC

## 2020-12-19 NOTE — Patient Instructions (Signed)
[place] at [facility] Discharge Instructions  You were seen and examined today by Dr. Delton Coombes. We will proceed with your second cycle of Taxotere. Return as scheduled for lab work and tx.    Thank you for choosing [place] at [facility] to provide your oncology and hematology care.  To afford each patient quality time with our provider, please arrive at least 15 minutes before your scheduled appointment time.   If you have a lab appointment with the [place] please come in thru the Main Entrance and check in at the main information desk.  You need to re-schedule your appointment should you arrive 10 or more minutes late.  We strive to give you quality time with our providers, and arriving late affects you and other patients whose appointments are after yours.  Also, if you no show three or more times for appointments you may be dismissed from the clinic at the providers discretion.     Again, thank you for choosing [facility].  Our hope is that these requests will decrease the amount of time that you wait before being seen by our physicians.       _____________________________________________________________  Should you have questions after your visit to [facility], please contact our office at [phone] and follow the prompts.  Our office hours are 8:00 a.m. and 4:30 p.m. Monday - Friday.  Please note that voicemails left after 4:00 p.m. may not be returned until the following business day.  We are closed weekends and major holidays.  You do have access to a nurse 24-7, just call the main number to the clinic [phone] and do not press any options, hold on the line and a nurse will answer the phone.    For prescription refill requests, have your pharmacy contact our office and allow 72 hours.    Due to Covid, you will need to wear a mask upon entering the hospital. If you do not have a mask, a mask  will be given to you at the Main Entrance upon arrival. For doctor visits, patients may have 1 support person age 76 or older with them. For treatment visits, patients can not have anyone with them due to social distancing guidelines and our immunocompromised population.

## 2020-12-19 NOTE — Patient Instructions (Signed)
[place]  Discharge Instructions: Thank you for choosing [place] to provide your oncology and hematology care.  If you have a lab appointment with the [facility], please come in thru the Main Entrance and check in at the main information desk.  Wear comfortable clothing and clothing appropriate for easy access to any Portacath or PICC line.   We strive to give you quality time with your provider. You may need to reschedule your appointment if you arrive late (15 or more minutes).  Arriving late affects you and other patients whose appointments are after yours.  Also, if you miss three or more appointments without notifying the office, you may be dismissed from the clinic at the provider's discretion.      For prescription refill requests, have your pharmacy contact our office and allow 72 hours for refills to be completed.    Today you received the following chemotherapy and/or immunotherapy agents Docetaxel   To help prevent nausea and vomiting after your treatment, we encourage you to take your nausea medication as directed.  BELOW ARE SYMPTOMS THAT SHOULD BE REPORTED IMMEDIATELY: *FEVER GREATER THAN 100.4 F (38 C) OR HIGHER *CHILLS OR SWEATING *NAUSEA AND VOMITING THAT IS NOT CONTROLLED WITH YOUR NAUSEA MEDICATION *UNUSUAL SHORTNESS OF BREATH *UNUSUAL BRUISING OR BLEEDING *URINARY PROBLEMS (pain or burning when urinating, or frequent urination) *BOWEL PROBLEMS (unusual diarrhea, constipation, pain near the anus) TENDERNESS IN MOUTH AND THROAT WITH OR WITHOUT PRESENCE OF ULCERS (sore throat, sores in mouth, or a toothache) UNUSUAL RASH, SWELLING OR PAIN  UNUSUAL VAGINAL DISCHARGE OR ITCHING   Items with * indicate a potential emergency and should be followed up as soon as possible or go to the Emergency Department if any problems should occur.  Please show the CHEMOTHERAPY ALERT CARD or IMMUNOTHERAPY ALERT CARD at check-in to the Emergency  Department and triage nurse.  Should you have questions after your visit or need to cancel or reschedule your appointment, please contact [facility] [phone]  and follow the prompts.  Office hours are 8:00 a.m. to 4:30 p.m. Monday - Friday. Please note that voicemails left after 4:00 p.m. may not be returned until the following business day.  We are closed weekends and major holidays. You have access to a nurse at all times for urgent questions. Please call the main number to the clinic [phone] and follow the prompts.  For any non-urgent questions, you may also contact your provider using MyChart. We now offer e-Visits for anyone 80 and older to request care online for non-urgent symptoms. For details visit [email or web address].   Also download the MyChart app! Go to the app store, search "MyChart", open the app, select [facility], and log in with your MyChart username and password.  Due to Covid, a mask is required upon entering the hospital/clinic. If you do not have a mask, one will be given to you upon arrival. For doctor visits, patients may have 1 support person aged 71 or older with them. For treatment visits, patients cannot have anyone with them due to current Covid guidelines and our immunocompromised population.

## 2020-12-19 NOTE — Progress Notes (Signed)
Patients port flushed without difficulty.  Good blood return noted with no bruising or swelling noted at site.  Patient remains accessed for chemotherapy treatment.  

## 2020-12-19 NOTE — Progress Notes (Signed)
Confirmed dose of taxotere today is increased to 75 mg/m2  Also received ok to reduce diphenhydramine dose to 25 mg IVPush due to age.  T.O. Dr Erasmo Leventhal RN/Aliha Diedrich Ronnald Ramp, PharmD

## 2020-12-19 NOTE — Progress Notes (Signed)
Pt presents today for Docetaxel per provider's order. Vital signs and other labs WNL for treatment. Pt is good for tx today per Dr.K pending CMP results.  He will do Taxotere only today and we will add cyramza later depending on how he does today. Magnesium is 1.5 orders given to give 2g IV of mg per Dr.K.  Docetaxel and 2g of Magnesium IV given today per MD orders. Tolerated infusion without adverse affects. Vital signs stable. No complaints at this time. Discharged from clinic ambulatory via walker in stable condition. Alert and oriented x 3. F/U with [facility] as scheduled.

## 2020-12-20 ENCOUNTER — Other Ambulatory Visit: Payer: Self-pay

## 2020-12-20 ENCOUNTER — Ambulatory Visit (HOSPITAL_COMMUNITY): Payer: Medicare Other

## 2020-12-20 ENCOUNTER — Encounter (HOSPITAL_COMMUNITY): Payer: Self-pay

## 2020-12-20 DIAGNOSIS — M6281 Muscle weakness (generalized): Secondary | ICD-10-CM

## 2020-12-20 DIAGNOSIS — R2689 Other abnormalities of gait and mobility: Secondary | ICD-10-CM

## 2020-12-20 DIAGNOSIS — R262 Difficulty in walking, not elsewhere classified: Secondary | ICD-10-CM

## 2020-12-20 NOTE — Therapy (Signed)
[place], [place], [address] Phone: [phone]   Fax:  [phone]  Physical Therapy Treatment  Patient Details  Name: Alan [name] MRN: [id number] Date of Birth: July 31, 1950 Referring Provider (PT): Derek Jack, MD   Encounter Date: 12/20/2020   PT End of Session - 12/20/20 1115     Visit Number 5    Number of Visits 12    Date for PT Re-Evaluation 01/17/21    Authorization Type [place]  - visit med [place], secondary medicaid traditional - no auth req with medicare as primary    Progress Note Due on Visit 10    PT Start Time 1102    PT Stop Time 1145    PT Time Calculation (min) 43 min    Activity Tolerance Patient limited by fatigue;Patient tolerated treatment well   SOB, occasional cough   Behavior During Therapy [facility] for tasks assessed/performed;Flat affect             Past Medical History:  Diagnosis Date   Anxiety    Arthritis    Dysrhythmia    Hyperlipemia    Hypertension    Hyperthyroidism    Lung cancer ([place])    Myocardial infarction ([place])    Paroxysmal atrial fibrillation ([place])    Port-A-Cath in place 11/14/2019   Tachycardia     Past Surgical History:  Procedure Laterality Date   APPLICATION OF CRANIAL NAVIGATION N/A 10/26/2020   Procedure: APPLICATION OF CRANIAL NAVIGATION;  Surgeon: Consuella Lose, MD;  Location: [facility];  Service: Neurosurgery;  Laterality: N/A;  RM 21   CARDIAC CATHETERIZATION     CRANIOTOMY Right 10/26/2020   Procedure: Stereotactic Right frontal craniotomy for resection of tumor with sonopet;  Surgeon: Consuella Lose, MD;  Location: [facility];  Service: Neurosurgery;  Laterality: Right;   PORTACATH PLACEMENT Right 11/01/2019   Procedure: INSERTION PORT-A-CATH;  Surgeon: Aviva Signs, MD;  Location: [facility];  Service: General;  Laterality: Right;    There were no vitals filed for this visit.   Subjective Assessment - 12/20/20 1113     Subjective  Shortness of breathe noted; no pain complaints.    Patient is accompained by: Family member                 [place] Adult PT Treatment/Exercise - 12/20/20 0001       Lumbar Exercises: Standing   Heel Raises 20 reps    Heel Raises Limitations toe raises x20    Other Standing Lumbar Exercises toe tapping 6in step 3x10 last set without HHA; 4" forward step ups with 1 HHA 10X each with rest between    Other Standing Lumbar Exercises sidestepping on blue line no UE 5RT      Lumbar Exercises: Seated   Sit to Stand 10 reps    Sit to Stand Limitations no HHA, eccentric control    Other Seated Lumbar Exercises scapular retraction and shoulder extension w/RTB 2x10              PT Education - 12/20/20 1206     Education Details HEP.    Person(s) Educated Patient    Methods Explanation    Comprehension Verbalized understanding              PT Short Term Goals - 12/20/20 1204       PT SHORT TERM GOAL #1   Title Patient will be independent in self management strategies to improve quality of life  and functional outcomes.    Time 3    Period Weeks    Status On-going    Target Date 12/27/20      PT SHORT TERM GOAL #2   Title Patient will be able to transition from Sit to stand 5x < 14 seconds without use of upper extremities    Time 3    Period Weeks    Status On-going    Target Date 12/27/20      PT SHORT TERM GOAL #3   Title Patient will report at least 25% improvement in overall symptoms and/or function to demonstrate improved functional mobility    Time 3    Period Weeks    Status On-going    Target Date 12/27/20               PT Long Term Goals - 12/20/20 1204       PT LONG TERM GOAL #1   Title Patient will report at least 50% improvement in overall symptoms and/or function to demonstrate improved functional mobility    Time 6    Period Weeks    Status On-going      PT LONG TERM GOAL #2   Title Patient will be score with at least 19/24 on DGI to  demonstrate improved dynamic balance    Time 6    Period Weeks    Status On-going      PT LONG TERM GOAL #3   Title Patient will be able to get up off toilet at home without assistance to demonstrate improved transitional mobility    Time 6    Period Weeks    Status On-going                   Plan - 12/20/20 1116     Clinical Impression Statement Session focused on strengthening and endurance activities. Wife present throughout session. Patient continues to fatigue quickly and require rest breaks. Patient does need encouragement and reminders for performance of exercises. Patient responds well to non-verbal communication, modeling and gestural cuing. Performed largely the same exercises as last session. Patient was able to perform side stepping outside parallel bars with close supervision with a few minor missteps. He completed 5 rounds through in 3 sets. Added seated resisted shoulder extension exercises with red theraband and issued to HEP. Patient would continue to benefit from skilled physical therapy to reduce impairment and improve function.    Personal Factors and Comorbidities Comorbidity 1;Comorbidity 2;Comorbidity 3+    Comorbidities active lung cancer, right frontal craniotomy    Examination-Activity Limitations Bathing;Squat;Stairs;Stand;Locomotion Level;Transfers;Dressing    Examination-Participation Restrictions Meal Prep;Yard Work;Community Activity;Shop;Cleaning    Stability/Clinical Decision Making Evolving/Moderate complexity    Rehab Potential Fair    PT Frequency 2x / week    PT Duration 6 weeks    PT Treatment/Interventions ADLs/Self Care Home Management;Canalith Repostioning;Cryotherapy;Electrical Stimulation;Therapeutic exercise;Balance training;Therapeutic activities;Manual techniques;Patient/family education;Neuromuscular re-education;Gait training;DME Instruction    PT Next Visit Plan Front office stated needs to change one of his appointments. continue to  progress functional strength and add balance next session.  Continue to educate on posture and breathing    PT Home Exercise Plan LAQ; 10/25: sitting tall and STS without HHA; 10/27: sidestep infront of counter at home and seated rows with RTB; 11/3 shoulder extension    Consulted and Agree with Plan of Care Patient;Family member/caregiver    Family Member Consulted wife - [name]  Patient will benefit from skilled therapeutic intervention in order to improve the following deficits and impairments:  Difficulty walking, Decreased mobility, Decreased balance, Decreased activity tolerance, Decreased endurance, Decreased knowledge of use of DME, Decreased knowledge of precautions, Cardiopulmonary status limiting activity, Decreased range of motion, Postural dysfunction  Visit Diagnosis: Muscle weakness (generalized)  Difficulty in walking, not elsewhere classified  Balance problem     Problem List Patient Active Problem List   Diagnosis Date Noted   Brain metastasis ([place]) 10/11/2020   Lung cancer ([place])    Port-A-Cath in place 11/14/2019   Squamous cell lung cancer, left ([place]) 11/08/2019   Malignant neoplasm of left lung ([place])    Mass of upper lobe of left lung 10/19/2019   Atrial fibrillation with rapid ventricular response ([place]) 12/25/2010   Hyperlipidemia 12/25/2010   Hyperglycemia 12/25/2010   Noncompliance with medications 12/25/2010   Tobacco abuse 12/25/2010   Pamala Hurry D. Hartnett-Rands, MS, PT Per [place] [phone]  Jeannie Done, PT 12/20/2020, 12:07 PM  [facility] [address] [place], [place], [address] Phone: [phone]   Fax:  [phone]  Name: Alan [name] MRN: [id number] Date of Birth: Aug 25, 1950

## 2020-12-25 ENCOUNTER — Other Ambulatory Visit: Payer: Self-pay

## 2020-12-25 ENCOUNTER — Ambulatory Visit (HOSPITAL_COMMUNITY): Payer: Medicare Other | Admitting: Physical Therapy

## 2020-12-25 ENCOUNTER — Encounter (HOSPITAL_COMMUNITY): Payer: Self-pay | Admitting: Physical Therapy

## 2020-12-25 DIAGNOSIS — R262 Difficulty in walking, not elsewhere classified: Secondary | ICD-10-CM

## 2020-12-25 DIAGNOSIS — M6281 Muscle weakness (generalized): Secondary | ICD-10-CM | POA: Diagnosis not present

## 2020-12-25 DIAGNOSIS — R2689 Other abnormalities of gait and mobility: Secondary | ICD-10-CM

## 2020-12-25 NOTE — Therapy (Signed)
[place], [place], [address] Phone: [phone]   Fax:  [phone]  Physical Therapy Treatment  Patient Details  Name: Alan [name] MRN: [id number] Date of Birth: 06-14-1950 Referring Provider (PT): Derek Jack, MD   Encounter Date: 12/25/2020   PT End of Session - 12/25/20 0919     Visit Number 6    Number of Visits 12    Date for PT Re-Evaluation 01/17/21    Authorization Type [place]  - visit med [place], secondary medicaid traditional - no auth req with medicare as primary    Progress Note Due on Visit 10    PT Start Time [phone]   late start due to pt confusion at start of session.   PT Stop Time 0949    PT Time Calculation (min) 26 min    Activity Tolerance Patient limited by fatigue;Patient tolerated treatment well   SOB, occasional cough   Behavior During Therapy [facility] for tasks assessed/performed;Flat affect             Past Medical History:  Diagnosis Date   Anxiety    Arthritis    Dysrhythmia    Hyperlipemia    Hypertension    Hyperthyroidism    Lung cancer ([place])    Myocardial infarction ([place])    Paroxysmal atrial fibrillation ([place])    Port-A-Cath in place 11/14/2019   Tachycardia     Past Surgical History:  Procedure Laterality Date   APPLICATION OF CRANIAL NAVIGATION N/A 10/26/2020   Procedure: APPLICATION OF CRANIAL NAVIGATION;  Surgeon: Consuella Lose, MD;  Location: [facility];  Service: Neurosurgery;  Laterality: N/A;  RM 21   CARDIAC CATHETERIZATION     CRANIOTOMY Right 10/26/2020   Procedure: Stereotactic Right frontal craniotomy for resection of tumor with sonopet;  Surgeon: Consuella Lose, MD;  Location: [facility];  Service: Neurosurgery;  Laterality: Right;   PORTACATH PLACEMENT Right 11/01/2019   Procedure: INSERTION PORT-A-CATH;  Surgeon: Aviva Signs, MD;  Location[facility];  Service: General;  Laterality: Right;    There were no vitals filed for this visit.    Subjective Assessment - 12/25/20 0927     Subjective Patient reports no pain and is doing exercises daily and they are still challenging. Reports he wants his wife present for the session.    Patient is accompained by: Family member    Currently in Pain? No/denies                [facility] PT Assessment - 12/25/20 0001       Assessment   Medical Diagnosis lung cancer    Referring Provider (PT) Derek Jack, MD                           [facility] Adult PT Treatment/Exercise - 12/25/20 0001       Lumbar Exercises: Standing   Other Standing Lumbar Exercises hip abd with2# ankle weight 4x5      Lumbar Exercises: Seated   Long Arc Quad on Chair 10 reps;Both   2# ankle weights 2 sets                      PT Short Term Goals - 12/20/20 1204       PT SHORT TERM GOAL #1   Title Patient will be independent in self management strategies to improve quality of life and functional outcomes.    Time 3  Period Weeks    Status On-going    Target Date 12/27/20      PT SHORT TERM GOAL #2   Title Patient will be able to transition from Sit to stand 5x < 14 seconds without use of upper extremities    Time 3    Period Weeks    Status On-going    Target Date 12/27/20      PT SHORT TERM GOAL #3   Title Patient will report at least 25% improvement in overall symptoms and/or function to demonstrate improved functional mobility    Time 3    Period Weeks    Status On-going    Target Date 12/27/20               PT Long Term Goals - 12/20/20 1204       PT LONG TERM GOAL #1   Title Patient will report at least 50% improvement in overall symptoms and/or function to demonstrate improved functional mobility    Time 6    Period Weeks    Status On-going      PT LONG TERM GOAL #2   Title Patient will be score with at least 19/24 on DGI to demonstrate improved dynamic balance    Time 6    Period Weeks    Status On-going      PT LONG TERM GOAL #3    Title Patient will be able to get up off toilet at home without assistance to demonstrate improved transitional mobility    Time 6    Period Weeks    Status On-going                   Plan - 12/25/20 9476     Clinical Impression Statement Patient with reports of almost passing out with standing exercise. HR measuring at 160 BPM seated during thing and SpO2 between 85-97% during session. After pursed lip breathing and rest HR came down to 68 (took manually and with pulse ox. Overall HR very inconsistent (patient with history of irregular af0b. With symptoms and poor tolerance to exercises, session ended early. Wife reported it was a very bad day for the patient too. Wil monitored symptoms and vitals next session.    Personal Factors and Comorbidities Comorbidity 1;Comorbidity 2;Comorbidity 3+    Comorbidities active lung cancer, right frontal craniotomy    Examination-Activity Limitations Bathing;Squat;Stairs;Stand;Locomotion Level;Transfers;Dressing    Examination-Participation Restrictions Meal Prep;Yard Work;Community Activity;Shop;Cleaning    Stability/Clinical Decision Making Evolving/Moderate complexity    Rehab Potential Fair    PT Frequency 2x / week    PT Duration 6 weeks    PT Treatment/Interventions ADLs/Self Care Home Management;Canalith Repostioning;Cryotherapy;Electrical Stimulation;Therapeutic exercise;Balance training;Therapeutic activities;Manual techniques;Patient/family education;Neuromuscular re-education;Gait training;DME Instruction    PT Next Visit Plan monitor vitals. continue to progress functional strength and add balance next session.  Continue to educate on posture and breathing    PT Home Exercise Plan LAQ; 10/25: sitting tall and STS without HHA; 10/27: sidestep infront of counter at home and seated rows with RTB; 11/3 shoulder extension    Consulted and Agree with Plan of Care Patient;Family member/caregiver    Family Member Consulted wife - [name]              Patient will benefit from skilled therapeutic intervention in order to improve the following deficits and impairments:  Difficulty walking, Decreased mobility, Decreased balance, Decreased activity tolerance, Decreased endurance, Decreased knowledge of use of DME, Decreased knowledge of precautions, Cardiopulmonary status limiting  activity, Decreased range of motion, Postural dysfunction  Visit Diagnosis: Muscle weakness (generalized)  Difficulty in walking, not elsewhere classified  Balance problem     Problem List Patient Active Problem List   Diagnosis Date Noted   Brain metastasis ([place]) 10/11/2020   Lung cancer ([place])    Port-A-Cath in place 11/14/2019   Squamous cell lung cancer, left ([place]) 11/08/2019   Malignant neoplasm of left lung ([place])    Mass of upper lobe of left lung 10/19/2019   Atrial fibrillation with rapid ventricular response ([place]) 12/25/2010   Hyperlipidemia 12/25/2010   Hyperglycemia 12/25/2010   Noncompliance with medications 12/25/2010   Tobacco abuse 12/25/2010   9:52 AM, 12/25/20 Jerene Pitch, DPT Physical Therapy with [facility]  [phone] office'   [place], [place], [address] Phone: [phone]   Fax:  [phone]  Name: JASUN GASPARINI MRN: [id number] Date of Birth: 08/01/1950

## 2020-12-27 ENCOUNTER — Encounter (HOSPITAL_COMMUNITY): Payer: Self-pay

## 2020-12-27 ENCOUNTER — Ambulatory Visit (HOSPITAL_COMMUNITY): Payer: Medicare Other

## 2020-12-27 ENCOUNTER — Other Ambulatory Visit (HOSPITAL_COMMUNITY): Payer: Self-pay

## 2020-12-27 ENCOUNTER — Other Ambulatory Visit: Payer: Self-pay

## 2020-12-27 DIAGNOSIS — R0602 Shortness of breath: Secondary | ICD-10-CM | POA: Diagnosis not present

## 2020-12-27 DIAGNOSIS — R2689 Other abnormalities of gait and mobility: Secondary | ICD-10-CM

## 2020-12-27 DIAGNOSIS — R262 Difficulty in walking, not elsewhere classified: Secondary | ICD-10-CM

## 2020-12-27 DIAGNOSIS — T80211A Bloodstream infection due to central venous catheter, initial encounter: Secondary | ICD-10-CM | POA: Diagnosis not present

## 2020-12-27 DIAGNOSIS — M6281 Muscle weakness (generalized): Secondary | ICD-10-CM

## 2020-12-27 NOTE — Therapy (Signed)
[place], [place], [address] Phone: [phone]   Fax:  [phone]  Physical Therapy Treatment  Patient Details  Name: Alan [name] MRN: [id number] Date of Birth: 02-Nov-1950 Referring Provider (PT): Derek Jack, MD   Encounter Date: 12/27/2020   PT End of Session - 12/27/20 1344     Visit Number 7    Number of Visits 12    Date for PT Re-Evaluation 01/17/21    Authorization Type [place]  - visit med [place], secondary medicaid traditional - no auth req with medicare as primary    Progress Note Due on Visit 10    PT Start Time 1317    PT Stop Time 1357    PT Time Calculation (min) 40 min    Activity Tolerance Patient limited by fatigue;Patient tolerated treatment well    Behavior During Therapy [facility] for tasks assessed/performed;Flat affect             Past Medical History:  Diagnosis Date   Anxiety    Arthritis    Dysrhythmia    Hyperlipemia    Hypertension    Hyperthyroidism    Lung cancer ([place])    Myocardial infarction ([place])    Paroxysmal atrial fibrillation ([place])    Port-A-Cath in place 11/14/2019   Tachycardia     Past Surgical History:  Procedure Laterality Date   APPLICATION OF CRANIAL NAVIGATION N/A 10/26/2020   Procedure: APPLICATION OF CRANIAL NAVIGATION;  Surgeon: Consuella Lose, MD;  Location: [place];  Service: Neurosurgery;  Laterality: N/A;  RM 21   CARDIAC CATHETERIZATION     CRANIOTOMY Right 10/26/2020   Procedure: Stereotactic Right frontal craniotomy for resection of tumor with sonopet;  Surgeon: Consuella Lose, MD;  Location: [place];  Service: Neurosurgery;  Laterality: Right;   PORTACATH PLACEMENT Right 11/01/2019   Procedure: INSERTION PORT-A-CATH;  Surgeon: Aviva Signs, MD;  Location: [facility];  Service: General;  Laterality: Right;    Vitals:   12/27/20 1328  SpO2: 99%     Subjective Assessment - 12/27/20 1320     Subjective Reports resuming chemo, and  increased fatigue.  Admits to no completing exercises at home.    Patient is accompained by: Family member   [name]   Pertinent History right frontal craniotomy, lung cancer    Patient Stated Goals want to be able to get up/off toilet by himself and be able to walk good.    Currently in Pain? No/denies                               [facility] Adult PT Treatment/Exercise - 12/27/20 0001       Lumbar Exercises: Standing   Heel Raises 20 reps    Heel Raises Limitations toe raises x20    Other Standing Lumbar Exercises Toe tapping 6in 2x 10; Step up BLE HHA 6in step 2x 5    Other Standing Lumbar Exercises sidestepping on blue line no UE 5RT      Lumbar Exercises: Seated   Sit to Stand 10 reps    Sit to Stand Limitations no HHA, eccentric control    Other Seated Lumbar Exercises scapular retraction and shoulder extension w/RTB 2x10                       PT Short Term Goals - 12/20/20 1204       PT SHORT  TERM GOAL #1   Title Patient will be independent in self management strategies to improve quality of life and functional outcomes.    Time 3    Period Weeks    Status On-going    Target Date 12/27/20      PT SHORT TERM GOAL #2   Title Patient will be able to transition from Sit to stand 5x < 14 seconds without use of upper extremities    Time 3    Period Weeks    Status On-going    Target Date 12/27/20      PT SHORT TERM GOAL #3   Title Patient will report at least 25% improvement in overall symptoms and/or function to demonstrate improved functional mobility    Time 3    Period Weeks    Status On-going    Target Date 12/27/20               PT Long Term Goals - 12/20/20 1204       PT LONG TERM GOAL #1   Title Patient will report at least 50% improvement in overall symptoms and/or function to demonstrate improved functional mobility    Time 6    Period Weeks    Status On-going      PT LONG TERM GOAL #2   Title Patient will be score  with at least 19/24 on DGI to demonstrate improved dynamic balance    Time 6    Period Weeks    Status On-going      PT LONG TERM GOAL #3   Title Patient will be able to get up off toilet at home without assistance to demonstrate improved transitional mobility    Time 6    Period Weeks    Status On-going                   Plan - 12/27/20 1401     Clinical Impression Statement Pt required significant amount of encouragements to complete all exercises this session.  C/o SOB following all exercises, vitals asses with SpO2 at 97-99% on room air.  HR range from 94-123bpm.  Pt and wife educated on importance of completing exercises at home for maximal benefits.    Personal Factors and Comorbidities Comorbidity 1;Comorbidity 2;Comorbidity 3+    Comorbidities active lung cancer, right frontal craniotomy    Examination-Activity Limitations Bathing;Squat;Stairs;Stand;Locomotion Level;Transfers;Dressing    Examination-Participation Restrictions Meal Prep;Yard Work;Community Activity;Shop;Cleaning    Stability/Clinical Decision Making Evolving/Moderate complexity    Clinical Decision Making Moderate    Rehab Potential Fair    PT Frequency 2x / week    PT Duration 6 weeks    PT Treatment/Interventions ADLs/Self Care Home Management;Canalith Repostioning;Cryotherapy;Electrical Stimulation;Therapeutic exercise;Balance training;Therapeutic activities;Manual techniques;Patient/family education;Neuromuscular re-education;Gait training;DME Instruction    PT Next Visit Plan monitor vitals. continue to progress functional strength and add balance next session.  Continue to educate on posture and breathing    PT Home Exercise Plan LAQ; 10/25: sitting tall and STS without HHA; 10/27: sidestep infront of counter at home and seated rows with RTB; 11/3 shoulder extension    Consulted and Agree with Plan of Care Patient;Family member/caregiver    Family Member Consulted wife - [name]              Patient will benefit from skilled therapeutic intervention in order to improve the following deficits and impairments:  Difficulty walking, Decreased mobility, Decreased balance, Decreased activity tolerance, Decreased endurance, Decreased knowledge of use of DME, Decreased knowledge of  precautions, Cardiopulmonary status limiting activity, Decreased range of motion, Postural dysfunction  Visit Diagnosis: Muscle weakness (generalized)  Difficulty in walking, not elsewhere classified  Balance problem     Problem List Patient Active Problem List   Diagnosis Date Noted   Brain metastasis ([place]) 10/11/2020   Lung cancer ([place])    Port-A-Cath in place 11/14/2019   Squamous cell lung cancer, left ([place]) 11/08/2019   Malignant neoplasm of left lung ([place])    Mass of upper lobe of left lung 10/19/2019   Atrial fibrillation with rapid ventricular response ([place]) 12/25/2010   Hyperlipidemia 12/25/2010   Hyperglycemia 12/25/2010   Noncompliance with medications 12/25/2010   Tobacco abuse 12/25/2010   Ihor Austin, LPTA/CLT; CBIS [phone]  Alan [name], PTA 12/27/2020, 6:27 PM  [place], [place], [address] Phone: [phone]   Fax:  [phone]  Name: Alan [name] MRN: [id number] Date of Birth: 01/11/1951

## 2020-12-28 ENCOUNTER — Encounter (HOSPITAL_COMMUNITY): Payer: Self-pay | Admitting: Hematology

## 2020-12-28 MED ORDER — METHYLPHENIDATE HCL 5 MG PO TABS: 5.0000 mg | ORAL_TABLET | Freq: Every day | ORAL | 0 refills | Status: DC

## 2020-12-29 ENCOUNTER — Other Ambulatory Visit: Payer: Self-pay

## 2020-12-29 ENCOUNTER — Emergency Department (HOSPITAL_COMMUNITY): Payer: Medicare Other

## 2020-12-29 ENCOUNTER — Encounter (HOSPITAL_COMMUNITY): Payer: Self-pay | Admitting: Emergency Medicine

## 2020-12-29 ENCOUNTER — Inpatient Hospital Stay (HOSPITAL_COMMUNITY)
Admission: EM | Admit: 2020-12-29 | Discharge: 2021-01-05 | DRG: 252 | Disposition: A | Discharge status: Home / Self Care | Payer: Medicare Other | Attending: Internal Medicine | Admitting: Internal Medicine

## 2020-12-29 DIAGNOSIS — T380X5A Adverse effect of glucocorticoids and synthetic analogues, initial encounter: Secondary | ICD-10-CM | POA: Diagnosis not present

## 2020-12-29 DIAGNOSIS — E869 Volume depletion, unspecified: Secondary | ICD-10-CM | POA: Diagnosis present

## 2020-12-29 DIAGNOSIS — F419 Anxiety disorder, unspecified: Secondary | ICD-10-CM | POA: Diagnosis present

## 2020-12-29 DIAGNOSIS — J9601 Acute respiratory failure with hypoxia: Secondary | ICD-10-CM | POA: Diagnosis not present

## 2020-12-29 DIAGNOSIS — R7881 Bacteremia: Secondary | ICD-10-CM

## 2020-12-29 DIAGNOSIS — I480 Paroxysmal atrial fibrillation: Secondary | ICD-10-CM | POA: Diagnosis present

## 2020-12-29 DIAGNOSIS — B9561 Methicillin susceptible Staphylococcus aureus infection as the cause of diseases classified elsewhere: Secondary | ICD-10-CM | POA: Diagnosis not present

## 2020-12-29 DIAGNOSIS — C7931 Secondary malignant neoplasm of brain: Secondary | ICD-10-CM | POA: Diagnosis present

## 2020-12-29 DIAGNOSIS — E871 Hypo-osmolality and hyponatremia: Secondary | ICD-10-CM | POA: Diagnosis present

## 2020-12-29 DIAGNOSIS — Z20822 Contact with and (suspected) exposure to covid-19: Secondary | ICD-10-CM | POA: Diagnosis present

## 2020-12-29 DIAGNOSIS — I3139 Other pericardial effusion (noninflammatory): Secondary | ICD-10-CM | POA: Diagnosis not present

## 2020-12-29 DIAGNOSIS — Z79899 Other long term (current) drug therapy: Secondary | ICD-10-CM

## 2020-12-29 DIAGNOSIS — E785 Hyperlipidemia, unspecified: Secondary | ICD-10-CM | POA: Diagnosis present

## 2020-12-29 DIAGNOSIS — J704 Drug-induced interstitial lung disorders, unspecified: Secondary | ICD-10-CM | POA: Diagnosis present

## 2020-12-29 DIAGNOSIS — Z79890 Hormone replacement therapy (postmenopausal): Secondary | ICD-10-CM

## 2020-12-29 DIAGNOSIS — E0590 Thyrotoxicosis, unspecified without thyrotoxic crisis or storm: Secondary | ICD-10-CM | POA: Diagnosis present

## 2020-12-29 DIAGNOSIS — Z7901 Long term (current) use of anticoagulants: Secondary | ICD-10-CM

## 2020-12-29 DIAGNOSIS — T80211A Bloodstream infection due to central venous catheter, initial encounter: Principal | ICD-10-CM | POA: Diagnosis present

## 2020-12-29 DIAGNOSIS — D72829 Elevated white blood cell count, unspecified: Secondary | ICD-10-CM | POA: Diagnosis not present

## 2020-12-29 DIAGNOSIS — T80211D Bloodstream infection due to central venous catheter, subsequent encounter: Secondary | ICD-10-CM

## 2020-12-29 DIAGNOSIS — R12 Heartburn: Secondary | ICD-10-CM | POA: Diagnosis not present

## 2020-12-29 DIAGNOSIS — D6481 Anemia due to antineoplastic chemotherapy: Secondary | ICD-10-CM | POA: Diagnosis present

## 2020-12-29 DIAGNOSIS — I252 Old myocardial infarction: Secondary | ICD-10-CM

## 2020-12-29 DIAGNOSIS — M1990 Unspecified osteoarthritis, unspecified site: Secondary | ICD-10-CM | POA: Diagnosis present

## 2020-12-29 DIAGNOSIS — E119 Type 2 diabetes mellitus without complications: Secondary | ICD-10-CM | POA: Diagnosis present

## 2020-12-29 DIAGNOSIS — Z87891 Personal history of nicotine dependence: Secondary | ICD-10-CM

## 2020-12-29 DIAGNOSIS — C801 Malignant (primary) neoplasm, unspecified: Secondary | ICD-10-CM | POA: Diagnosis not present

## 2020-12-29 DIAGNOSIS — J90 Pleural effusion, not elsewhere classified: Secondary | ICD-10-CM | POA: Diagnosis present

## 2020-12-29 DIAGNOSIS — J189 Pneumonia, unspecified organism: Secondary | ICD-10-CM | POA: Diagnosis present

## 2020-12-29 DIAGNOSIS — I10 Essential (primary) hypertension: Secondary | ICD-10-CM | POA: Diagnosis present

## 2020-12-29 DIAGNOSIS — T451X5A Adverse effect of antineoplastic and immunosuppressive drugs, initial encounter: Secondary | ICD-10-CM | POA: Diagnosis present

## 2020-12-29 DIAGNOSIS — N179 Acute kidney failure, unspecified: Secondary | ICD-10-CM | POA: Diagnosis present

## 2020-12-29 DIAGNOSIS — I4891 Unspecified atrial fibrillation: Secondary | ICD-10-CM | POA: Diagnosis not present

## 2020-12-29 DIAGNOSIS — Z79818 Long term (current) use of other agents affecting estrogen receptors and estrogen levels: Secondary | ICD-10-CM

## 2020-12-29 DIAGNOSIS — D638 Anemia in other chronic diseases classified elsewhere: Secondary | ICD-10-CM | POA: Diagnosis present

## 2020-12-29 DIAGNOSIS — R6521 Severe sepsis with septic shock: Secondary | ICD-10-CM | POA: Diagnosis present

## 2020-12-29 DIAGNOSIS — Y848 Other medical procedures as the cause of abnormal reaction of the patient, or of later complication, without mention of misadventure at the time of the procedure: Secondary | ICD-10-CM | POA: Diagnosis present

## 2020-12-29 DIAGNOSIS — C3412 Malignant neoplasm of upper lobe, left bronchus or lung: Secondary | ICD-10-CM | POA: Diagnosis present

## 2020-12-29 DIAGNOSIS — Z923 Personal history of irradiation: Secondary | ICD-10-CM

## 2020-12-29 DIAGNOSIS — J9621 Acute and chronic respiratory failure with hypoxia: Secondary | ICD-10-CM | POA: Diagnosis present

## 2020-12-29 DIAGNOSIS — A4101 Sepsis due to Methicillin susceptible Staphylococcus aureus: Secondary | ICD-10-CM | POA: Diagnosis present

## 2020-12-29 DIAGNOSIS — R053 Chronic cough: Secondary | ICD-10-CM | POA: Diagnosis present

## 2020-12-29 DIAGNOSIS — C3490 Malignant neoplasm of unspecified part of unspecified bronchus or lung: Secondary | ICD-10-CM | POA: Diagnosis present

## 2020-12-29 DIAGNOSIS — I959 Hypotension, unspecified: Secondary | ICD-10-CM | POA: Diagnosis present

## 2020-12-29 DIAGNOSIS — R0602 Shortness of breath: Secondary | ICD-10-CM | POA: Diagnosis present

## 2020-12-29 DIAGNOSIS — I340 Nonrheumatic mitral (valve) insufficiency: Secondary | ICD-10-CM | POA: Diagnosis not present

## 2020-12-29 DIAGNOSIS — Z8249 Family history of ischemic heart disease and other diseases of the circulatory system: Secondary | ICD-10-CM

## 2020-12-29 LAB — RESP PANEL BY RT-PCR (FLU A&B, COVID) ARPGX2
Influenza A by PCR: NEGATIVE
Influenza B by PCR: NEGATIVE
SARS Coronavirus 2 by RT PCR: NEGATIVE

## 2020-12-29 LAB — COMPREHENSIVE METABOLIC PANEL
ALT: 21 U/L (ref 0–44)
AST: 24 U/L (ref 15–41)
Albumin: 2.6 g/dL — ABNORMAL LOW (ref 3.5–5.0)
Alkaline Phosphatase: 53 U/L (ref 38–126)
Anion gap: 12 (ref 5–15)
BUN: 42 mg/dL — ABNORMAL HIGH (ref 8–23)
CO2: 18 mmol/L — ABNORMAL LOW (ref 22–32)
Calcium: 8.3 mg/dL — ABNORMAL LOW (ref 8.9–10.3)
Chloride: 104 mmol/L (ref 98–111)
Creatinine, Ser: 1.44 mg/dL — ABNORMAL HIGH (ref 0.61–1.24)
GFR, Estimated: 52 mL/min — ABNORMAL LOW (ref >60)
Glucose, Bld: 163 mg/dL — ABNORMAL HIGH (ref 70–99)
Potassium: 3.9 mmol/L (ref 3.5–5.1)
Sodium: 134 mmol/L — ABNORMAL LOW (ref 135–145)
Total Bilirubin: 1.1 mg/dL (ref 0.3–1.2)
Total Protein: 6.3 g/dL — ABNORMAL LOW (ref 6.5–8.1)

## 2020-12-29 LAB — CBC WITH DIFFERENTIAL/PLATELET
Band Neutrophils: 12 %
Basophils Absolute: 0 10*3/uL (ref 0.0–0.1)
Basophils Relative: 0 %
Eosinophils Absolute: 0 10*3/uL (ref 0.0–0.5)
Eosinophils Relative: 0 %
HCT: 27.4 % — ABNORMAL LOW (ref 39.0–52.0)
Hemoglobin: 9 g/dL — ABNORMAL LOW (ref 13.0–17.0)
Lymphocytes Relative: 13 %
Lymphs Abs: 0.7 10*3/uL (ref 0.7–4.0)
MCH: 31.7 pg (ref 26.0–34.0)
MCHC: 32.8 g/dL (ref 30.0–36.0)
MCV: 96.5 fL (ref 80.0–100.0)
Metamyelocytes Relative: 6 %
Monocytes Absolute: 0.4 10*3/uL (ref 0.1–1.0)
Monocytes Relative: 8 %
Myelocytes: 4 %
Neutro Abs: 3.8 10*3/uL (ref 1.7–7.7)
Neutrophils Relative %: 57 %
Platelets: 230 10*3/uL (ref 150–400)
RBC: 2.84 MIL/uL — ABNORMAL LOW (ref 4.22–5.81)
RDW: 16 % — ABNORMAL HIGH (ref 11.5–15.5)
WBC: 5.5 10*3/uL (ref 4.0–10.5)
nRBC: 6.9 % — ABNORMAL HIGH (ref 0.0–0.2)

## 2020-12-29 LAB — LACTIC ACID, PLASMA
Lactic Acid, Venous: 2.4 mmol/L (ref 0.5–1.9)
Lactic Acid, Venous: 2.3 mmol/L (ref 0.5–1.9)

## 2020-12-29 LAB — TROPONIN I (HIGH SENSITIVITY)
Troponin I (High Sensitivity): 9 ng/L (ref <18)
Troponin I (High Sensitivity): 8 ng/L (ref <18)

## 2020-12-29 LAB — BRAIN NATRIURETIC PEPTIDE: B Natriuretic Peptide: 950 pg/mL — ABNORMAL HIGH (ref 0.0–100.0)

## 2020-12-29 MED ORDER — ETOMIDATE 2 MG/ML IV SOLN
10.0000 mg | Freq: Once | INTRAVENOUS | Status: AC
  Administered 2020-12-29: 10 mg via INTRAVENOUS
  Filled 2020-12-29: qty 10

## 2020-12-29 MED ORDER — LEVOTHYROXINE SODIUM 100 MCG PO TABS
100.0000 ug | ORAL_TABLET | Freq: Every day | ORAL | Status: DC
Start: 2020-12-30 — End: 2021-01-05
  Administered 2020-12-30 – 2021-01-05 (×7): 100 ug via ORAL
  Filled 2020-12-29 (×7): qty 1

## 2020-12-29 MED ORDER — ALBUTEROL SULFATE (2.5 MG/3ML) 0.083% IN NEBU: 2.5000 mg | INHALATION_SOLUTION | RESPIRATORY_TRACT | Status: DC | PRN

## 2020-12-29 MED ORDER — LACTATED RINGERS IV BOLUS
500.0000 mL | Freq: Once | INTRAVENOUS | Status: AC
  Administered 2020-12-29: 500 mL via INTRAVENOUS

## 2020-12-29 MED ORDER — SODIUM CHLORIDE 0.9 % IV SOLN
1.0000 g | INTRAVENOUS | Status: DC
  Administered 2020-12-30 – 2020-12-31 (×2): 1 g via INTRAVENOUS
  Filled 2020-12-29 (×2): qty 10

## 2020-12-29 MED ORDER — SODIUM CHLORIDE 0.9% FLUSH
3.0000 mL | Freq: Two times a day (BID) | INTRAVENOUS | Status: DC
  Administered 2020-12-29 – 2021-01-05 (×12): 3 mL via INTRAVENOUS

## 2020-12-29 MED ORDER — ALPRAZOLAM 0.25 MG PO TABS: 0.2500 mg | ORAL_TABLET | Freq: Three times a day (TID) | ORAL | Status: DC | PRN

## 2020-12-29 MED ORDER — METOPROLOL TARTRATE 25 MG PO TABS
25.0000 mg | ORAL_TABLET | Freq: Two times a day (BID) | ORAL | Status: DC
  Administered 2020-12-30 – 2021-01-05 (×13): 25 mg via ORAL
  Filled 2020-12-29 (×13): qty 1

## 2020-12-29 MED ORDER — METHYLPREDNISOLONE SODIUM SUCC 40 MG IJ SOLR
40.0000 mg | Freq: Two times a day (BID) | INTRAMUSCULAR | Status: DC
  Administered 2020-12-29 – 2020-12-31 (×5): 40 mg via INTRAVENOUS
  Filled 2020-12-29 (×4): qty 1

## 2020-12-29 MED ORDER — ACETAMINOPHEN 650 MG RE SUPP: 650.0000 mg | Freq: Four times a day (QID) | RECTAL | Status: DC | PRN

## 2020-12-29 MED ORDER — LACTATED RINGERS IV BOLUS
1000.0000 mL | Freq: Once | INTRAVENOUS | Status: AC
  Administered 2020-12-29: 1000 mL via INTRAVENOUS

## 2020-12-29 MED ORDER — CHLORHEXIDINE GLUCONATE CLOTH 2 % EX PADS
6.0000 | MEDICATED_PAD | Freq: Every day | CUTANEOUS | Status: DC
  Administered 2020-12-29 – 2021-01-01 (×4): 6 via TOPICAL

## 2020-12-29 MED ORDER — DEXTROMETHORPHAN POLISTIREX ER 30 MG/5ML PO SUER
30.0000 mg | ORAL | Status: DC | PRN
  Administered 2020-12-30 – 2021-01-04 (×9): 30 mg via ORAL
  Filled 2020-12-29 (×15): qty 5

## 2020-12-29 MED ORDER — ONDANSETRON HCL 4 MG PO TABS: 4.0000 mg | ORAL_TABLET | Freq: Four times a day (QID) | ORAL | Status: DC | PRN

## 2020-12-29 MED ORDER — SODIUM CHLORIDE 0.9 % IV SOLN
1.0000 g | Freq: Once | INTRAVENOUS | Status: AC
  Administered 2020-12-29: 1 g via INTRAVENOUS
  Filled 2020-12-29: qty 10

## 2020-12-29 MED ORDER — ONDANSETRON HCL 4 MG/2ML IJ SOLN: 4.0000 mg | Freq: Four times a day (QID) | INTRAMUSCULAR | Status: DC | PRN

## 2020-12-29 MED ORDER — SODIUM CHLORIDE 0.9 % IV BOLUS
1000.0000 mL | Freq: Once | INTRAVENOUS | Status: AC
  Administered 2020-12-29: 1000 mL via INTRAVENOUS

## 2020-12-29 MED ORDER — APIXABAN 5 MG PO TABS
5.0000 mg | ORAL_TABLET | Freq: Two times a day (BID) | ORAL | Status: DC
  Administered 2020-12-29 – 2020-12-31 (×4): 5 mg via ORAL
  Filled 2020-12-29 (×4): qty 1

## 2020-12-29 MED ORDER — SODIUM CHLORIDE 0.9 % IV SOLN
500.0000 mg | INTRAVENOUS | Status: DC
  Administered 2020-12-29 – 2020-12-30 (×2): 500 mg via INTRAVENOUS
  Filled 2020-12-29 (×2): qty 500

## 2020-12-29 MED ORDER — ACETAMINOPHEN 325 MG PO TABS
650.0000 mg | ORAL_TABLET | Freq: Four times a day (QID) | ORAL | Status: DC | PRN
  Administered 2020-12-31 – 2021-01-05 (×6): 650 mg via ORAL
  Filled 2020-12-29 (×6): qty 2

## 2020-12-29 NOTE — Sedation Documentation (Signed)
Pt converted to HR of 73/min. Rate irregular, consistent with afib

## 2020-12-29 NOTE — ED Provider Notes (Signed)
[facility] EMERGENCY DEPARTMENT Provider Note   CSN: [id number] Arrival date & time: 12/29/20  3354     History No chief complaint on file.   Alan [name] is a 70 y.o. male.  Brought in by ambulance from a local store.  Complaining of shortness of breath lightheadedness and palpitations.  Symptoms started this morning.  He has a history of lung cancer receiving chemotherapy and history of atrial fibrillation on Eliquis.  He denies any fall or trauma.  Denies any chest pain.  EMS reports O2 saturation was very low around 70% on room air placed on a nonrebreather and brought to the ER.      Past Medical History:  Diagnosis Date   Anxiety    Arthritis    Dysrhythmia    Hyperlipemia    Hypertension    Hyperthyroidism    Lung cancer (HCC)    Myocardial infarction ([place])    Paroxysmal atrial fibrillation ([place])    Port-A-Cath in place 11/14/2019   Tachycardia     Patient Active Problem List   Diagnosis Date Noted   Brain metastasis ([place]) 10/11/2020   Lung cancer ([place])    Port-A-Cath in place 11/14/2019   Squamous cell lung cancer, left ([place]) 11/08/2019   Malignant neoplasm of left lung ([place])    Mass of upper lobe of left lung 10/19/2019   Atrial fibrillation with rapid ventricular response ([place]) 12/25/2010   Hyperlipidemia 12/25/2010   Hyperglycemia 12/25/2010   Noncompliance with medications 12/25/2010   Tobacco abuse 12/25/2010    Past Surgical History:  Procedure Laterality Date   APPLICATION OF CRANIAL NAVIGATION N/A 10/26/2020   Procedure: APPLICATION OF CRANIAL NAVIGATION;  Surgeon: Consuella Lose, MD;  Location: [place];  Service: Neurosurgery;  Laterality: N/A;  RM 21   CARDIAC CATHETERIZATION     CRANIOTOMY Right 10/26/2020   Procedure: Stereotactic Right frontal craniotomy for resection of tumor with sonopet;  Surgeon: Consuella Lose, MD;  Location: [place];  Service: Neurosurgery;  Laterality: Right;   PORTACATH PLACEMENT Right 11/01/2019   Procedure:  INSERTION PORT-A-CATH;  Surgeon: Aviva Signs, MD;  Location[facility];  Service: General;  Laterality: Right;       Family History  Problem Relation Age of Onset   Hypertension Father    Dementia Father        died age 43   Heart disease Father    Heart failure Other     Social History   Tobacco Use   Smoking status: Former    Years: 45.00    Types: Cigars, Cigarettes    Quit date: 10/19/2014    Years since quitting: 6.2   Smokeless tobacco: Never  Vaping Use   Vaping Use: Never used  Substance Use Topics   Alcohol use: Not Currently   Drug use: No    Home Medications Prior to Admission medications   Medication Sig Start Date End Date Taking? Authorizing Provider  acetaminophen (TYLENOL) 500 MG tablet Take 500 mg by mouth every 6 (six) hours as needed for moderate pain or headache.   Yes [provider]  albuterol (VENTOLIN HFA) 108 (90 Base) MCG/ACT inhaler Inhale 1-2 puffs into the lungs every 6 (six) hours as needed for wheezing or shortness of breath.   Yes [provider]  ALPRAZolam Duanne Moron) 0.25 MG tablet TAKE 1 TABLET BY MOUTH THREE TIMES DAILY AS NEEDED FOR ANXIETY 08/27/20  Yes Pennington, Rebekah M, PA-C  apixaban (ELIQUIS) 5 MG TABS tablet Take 1 tablet by  mouth twice daily 12/03/20  Yes Branch, Alphonse Guild, MD  dextromethorphan (DELSYM) 30 MG/5ML liquid Take 30 mg by mouth as needed for cough.   Yes [provider]  levothyroxine (SYNTHROID) 100 MCG tablet Take 1 tablet (100 mcg total) by mouth daily before breakfast. 07/10/20  Yes Derek Jack, MD  lidocaine (XYLOCAINE) 2 % solution TAKE 15 ML BY MOUTH 4 TIMES DAILY WITH MEALS 11/07/20  Yes Derek Jack, MD  lidocaine-prilocaine (EMLA) cream APPLY SMALL AMOUNT TO PORTA-CATH SITE AND COVER WITH PLASTIC WRAP 1 HOUR PRIOR TO CHEMO APPOINTMENTS. 10/09/20  Yes Derek Jack, MD  megestrol (MEGACE) 400 MG/10ML suspension Take 10 mLs (400 mg total) by mouth 2 (two) times daily.  11/14/20  Yes Derek Jack, MD  methylphenidate (RITALIN) 5 MG tablet Take 1 tablet (5 mg total) by mouth daily. 12/28/20  Yes Derek Jack, MD  metoprolol tartrate (LOPRESSOR) 25 MG tablet Take 1 tablet (25 mg total) by mouth 2 (two) times daily. 11/09/20 02/07/21 Yes Verta Ellen., NP  HYDROcodone bit-homatropine [facility]) 5-1.5 MG/5ML syrup Take 15 mLs by mouth every 8 (eight) hours. Patient not taking: Reported on 12/29/2020 11/28/20   Derek Jack, MD  Melatonin 10 MG TABS Take 10 tablets by mouth at bedtime as needed (sleep). Patient not taking: Reported on 12/29/2020 08/29/20   Derek Jack, MD    Allergies    Patient has no known allergies.  Review of Systems   Review of Systems  Constitutional:  Negative for fever.  HENT:  Negative for ear pain and sore throat.   Eyes:  Negative for pain.  Respiratory:  Positive for cough and shortness of breath.   Cardiovascular:  Positive for palpitations. Negative for chest pain.  Gastrointestinal:  Negative for abdominal pain.  Genitourinary:  Negative for flank pain.  Musculoskeletal:  Negative for back pain.  Skin:  Negative for color change and rash.  Neurological:  Negative for syncope.  All other systems reviewed and are negative.  Physical Exam Updated Vital Signs BP 118/84   Pulse 71   Temp (!) 97 F (36.1 C) (Rectal) Comment: Warm blankets provided. Bear hugger placed  Resp (!) 22   Ht 5\' 4"  (1.626 m)   Wt 72.4 kg   SpO2 99%   BMI 27.40 kg/m   Physical Exam Constitutional:      Appearance: He is well-developed. He is ill-appearing.     Comments: Cachectic, tired appearing  HENT:     Head: Normocephalic.     Nose: Nose normal.  Eyes:     Extraocular Movements: Extraocular movements intact.  Cardiovascular:     Rate and Rhythm: Tachycardia present. Rhythm irregular.  Pulmonary:     Effort: Pulmonary effort is normal.  Abdominal:     Tenderness: There is no abdominal tenderness.  There is no guarding or rebound.  Skin:    Coloration: Skin is not jaundiced.  Neurological:     General: No focal deficit present.     Mental Status: He is oriented to person, place, and time. Mental status is at baseline.    ED Results / Procedures / Treatments   Labs (all labs ordered are listed, but only abnormal results are displayed) Labs Reviewed  CBC WITH DIFFERENTIAL/PLATELET - Abnormal; Notable for the following components:      Result Value   RBC 2.84 (*)    Hemoglobin 9.0 (*)    HCT 27.4 (*)    RDW 16.0 (*)    nRBC 6.9 (*)  All other components within normal limits  COMPREHENSIVE METABOLIC PANEL - Abnormal; Notable for the following components:   Sodium 134 (*)    CO2 18 (*)    Glucose, Bld 163 (*)    BUN 42 (*)    Creatinine, Ser 1.44 (*)    Calcium 8.3 (*)    Total Protein 6.3 (*)    Albumin 2.6 (*)    GFR, Estimated 52 (*)    All other components within normal limits  BRAIN NATRIURETIC PEPTIDE - Abnormal; Notable for the following components:   B Natriuretic Peptide 950.0 (*)    All other components within normal limits  LACTIC ACID, PLASMA - Abnormal; Notable for the following components:   Lactic Acid, Venous 2.4 (*)    All other components within normal limits  LACTIC ACID, PLASMA - Abnormal; Notable for the following components:   Lactic Acid, Venous 2.3 (*)    All other components within normal limits  CULTURE, BLOOD (ROUTINE X 2)  CULTURE, BLOOD (ROUTINE X 2)  RESP PANEL BY RT-PCR (FLU A&B, COVID) ARPGX2  BLOOD GAS, ARTERIAL  URINALYSIS, ROUTINE W REFLEX MICROSCOPIC  TROPONIN I (HIGH SENSITIVITY)  TROPONIN I (HIGH SENSITIVITY)    EKG None  Radiology DG Chest Port 1 View  Result Date: 12/29/2020 CLINICAL DATA:  Shortness of breath EXAM: PORTABLE CHEST 1 VIEW COMPARISON:  Previous studies including the examination of radiographs done on 11/01/2019 and CT done on 09/05/2020 FINDINGS: Transverse diameter of heart is increased. There is  homogeneous opacification in the left apex. There are patchy infiltrates in the left mid and left lower lung fields. There is blunting of left lateral CP angle. Right lung is clear. There is no pneumothorax. IMPRESSION: There is homogeneous opacity in the left apex. There are patchy infiltrates in the left mid and left lower lung fields and prominence of left hilum. Findings suggest pneumonia with possible underlying neoplastic process. Small left pleural effusion is seen. Electronically Signed   By: Elmer Picker M.D.   On: 12/29/2020 11:10    Procedures .Cardioversion  Date/Time: 12/29/2020 1:00 PM Performed by: Luna Fuse, MD Authorized by: Luna Fuse, MD   Consent:    Consent obtained:  Verbal and written   Consent given by:  Patient and spouse Attempt one:    Cardioversion mode:  Synchronous   Waveform:  Biphasic   Shock (Joules):  200   Shock outcome:  Conversion to normal sinus rhythm Post-procedure details:    Patient status:  Awake .Critical Care Performed by: Luna Fuse, MD Authorized by: Luna Fuse, MD   Critical care provider statement:    Critical care time (minutes):  40   Critical care time was exclusive of:  Separately billable procedures and treating other patients and teaching time   Critical care was necessary to treat or prevent imminent or life-threatening deterioration of the following conditions:  Cardiac failure   Medications Ordered in ED Medications  cefTRIAXone (ROCEPHIN) 1 g in sodium chloride 0.9 % 100 mL IVPB (has no administration in time range)  azithromycin (ZITHROMAX) 500 mg in sodium chloride 0.9 % 250 mL IVPB (has no administration in time range)  lactated ringers bolus 1,000 mL (0 mLs Intravenous Stopped 12/29/20 1134)  lactated ringers bolus 1,000 mL (1,000 mLs Intravenous New Bag/Given 12/29/20 1145)  etomidate (AMIDATE) injection 10 mg (10 mg Intravenous Given 12/29/20 1248)    ED Course  I have reviewed the triage  vital signs and the nursing notes.  Pertinent labs & imaging results that were available during my care of the patient were reviewed by me and considered in my medical decision making (see chart for details).    MDM Rules/Calculators/A&P                           Blood pressure on arrival was low 38I systolic heart rate in the 150 and irregular.  Patient given 2 L bolus of fluid with improvement of blood pressure but still remains very low ranging around 71-959 systolic, heart rate around 150 bpm and irregular.  Decision made to cardiovert the patient given hypotension and tachycardia.  Risk and benefits discussed.  Patient given 10 mg of etomidate and cardioverted with 200 J with successful cardioversion to sinus rhythm.   Final Clinical Impression(s) / ED Diagnoses Final diagnoses:  Hypotension, unspecified hypotension type  Atrial fibrillation with rapid ventricular response (HCC)  Malignant neoplasm of lung, unspecified laterality, unspecified part of lung [facility])    Rx / DC Orders ED Discharge Orders     None        Luna Fuse, MD 12/29/20 1303

## 2020-12-29 NOTE — Consult Note (Signed)
NAME:  Alan [name], MRN:  [id number], DOB:  [birth year], LOS: 0 ADMISSION DATE:  12/29/2020, CONSULTATION DATE:  12/29/2020  REFERRING MD:  APED, Hong , CHIEF COMPLAINT:  hypotension, hypoxia   History of Present Illness:  70 year old ex-smoker with left-sided lung cancer status post chemoradiation, now on second line chemotherapy due to disease progression brought in by EMS from local store to [facility], ED with hypoxia saturation 70% on room air , found to be in atrial fibrillation/RVR with soft blood pressure 92 ystolic, underwent DCCV with conversion to sinus rhythm but hypotension persisted hence transferred to [facility], ICU.  Patient is currently in sinus rhythm.  Patient currently denies any chest pain/chest pressure/palpitations/nausea/vomiting/diarrhea/hemoptysis/hematemesis/hematochezia.  No fevers or rigors. He does complain of chills.  He has a baseline cough which is unchanged.  He develops dyspnea on exertion that is unchanged over the past 1 week.  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 He had disease progression with brain mets and underwent craniotomy and resection of solitary brain met and was switched to docetaxel & ramucirumab -cycle 1 was on 8/17 and cycle 2 was on 11/2 , completed Keppra and dexamethasone  Significant Hospital Events: Including procedures, antibiotic start and stop dates in addition to other pertinent events     Interim History / Subjective:    Objective   Blood pressure 108/72, pulse 73, temperature 98.7 F (37.1 C), temperature source Oral, resp. rate (!) 23, height 5' 4"  (1.626 m), weight 72.4 kg, SpO2 100 %.        Intake/Output Summary (Last 24 hours) at 12/29/2020  1642 Last data filed at 12/29/2020 1422 Gross per 24 hour  Intake 2100 ml  Output --  Net 2100 ml   Filed Weights   12/29/20 1014  Weight: 72.4 kg    Examination: General: No acute distress HENT: Atraumatic/normocephalic mucous membranes are moist.  No significant JVD. Lungs: Very minimal scattered wheezing bilaterally.  No significant rhonchi or rales. Cardiovascular: Regular rate no murmur rub or gallop appreciated. Abdomen: Soft, nondistended, nontender, no rebound/rigidity/guarding.  Positive bowel sounds. Extremities: Distal pulse intact x4.  No significant edema of the extremities. Neuro: Conscious alert and oriented x3.  Motor is intact x4 extremities but his left lower extremity slightly weaker than his right which is chronic.  Pupils are equal.   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 Actively being treated with chemotherapy. AKI Anemia associated with chemotherapy. Chronic hypoxic respiratory failure secondary to metastatic lung CA.  Plan: Patient was admitted to the PCU for further work-up. Discussed the case with Dr. Elsworth Soho.  We will start steroids since part of the respiratory issue could be related to chemotherapy. Patient is received 3.5 L of crystalloid IV fluids.  Blood pressure is significantly improved. Patient is currently on nasal cannula with appropriate oxygen saturations above 92%. Discussed with Dr. Lorenso Quarry he is agreed to admit the patient.  Appreciate his assistance.   Best Practice (right click and "Reselect all SmartList Selections" daily)   Diet/type: Regular consistency (see orders) DVT prophylaxis:  GI prophylaxis: N/A Lines: N/A Foley:  N/A Code Status:  full code Last date of multidisciplinary goals of care  discussion []   Labs   CBC: Recent Labs  Lab 12/29/20 1011  WBC 5.5  NEUTROABS 3.8   HGB 9.0*  HCT 27.4*  MCV 96.5  PLT 812    Basic Metabolic Panel: Recent Labs  Lab 12/29/20 1011  NA 134*  K 3.9  CL 104  CO2 18*  GLUCOSE 163*  BUN 42*  CREATININE 1.44*  CALCIUM 8.3*   GFR: Estimated Creatinine Clearance: 42.9 mL/min (A) (by C-G formula based on SCr of 1.44 mg/dL (H)). Recent Labs  Lab 12/29/20 1011 12/29/20 1207  WBC 5.5  --   LATICACIDVEN 2.4* 2.3*    Liver Function Tests: Recent Labs  Lab 12/29/20 1011  AST 24  ALT 21  ALKPHOS 53  BILITOT 1.1  PROT 6.3*  ALBUMIN 2.6*   No results for input(s): LIPASE, AMYLASE in the last 168 hours. No results for input(s): AMMONIA in the last 168 hours.  ABG    Component Value Date/Time   PHART 7.449 10/26/2020 0930   PCO2ART 32.2 10/26/2020 0930   PO2ART 394 (H) 10/26/2020 0930   HCO3 22.9 10/26/2020 0930   TCO2 24 10/26/2020 0930   ACIDBASEDEF 1.0 10/26/2020 0930   O2SAT 100.0 10/26/2020 0930     Coagulation Profile: No results for input(s): INR, PROTIME in the last 168 hours.  Cardiac Enzymes: No results for input(s): CKTOTAL, CKMB, CKMBINDEX, TROPONINI in the last 168 hours.  HbA1C: Hgb A1c MFr Bld  Date/Time Value Ref Range Status  11/14/2020 08:47 AM 6.6 (H) 4.8 - 5.6 % Final    Comment:    (NOTE)         Prediabetes: 5.7 - 6.4         Diabetes: >6.4         Glycemic control for adults with diabetes: <7.0   12/25/2010 04:39 AM 5.9 (H) <5.7 % Final    Comment:    (NOTE)                                                                       According to the ADA Clinical Practice Recommendations for 2011, when HbA1c is used as a screening test:  >=6.5%   Diagnostic of Diabetes Mellitus           (if abnormal result is confirmed) 5.7-6.4%   Increased risk of developing Diabetes Mellitus References:Diagnosis and Classification of Diabetes Mellitus,Diabetes XNTZ,0017,49(SWHQP 1):S62-S69 and Standards of Medical Care in         Diabetes - 2011,Diabetes RFFM,3846,65 (Suppl  1):S11-S61.    CBG: No results for input(s): GLUCAP in the last 168 hours.  Review of Systems:   10 point review of systems was completed.  Past Medical History:  He,  has a past medical history of Anxiety, Arthritis, Dysrhythmia, Hyperlipemia, Hypertension, Hyperthyroidism, Lung cancer ([place]), Myocardial infarction [facility]), Paroxysmal atrial fibrillation ([place]), Port-A-Cath in place (11/14/2019), and Tachycardia.   Surgical History:   Past Surgical History:  Procedure Laterality Date   APPLICATION OF CRANIAL NAVIGATION N/A 10/26/2020   Procedure: APPLICATION OF CRANIAL NAVIGATION;  Surgeon: Consuella Lose, MD;  Location: [place];  Service: Neurosurgery;  Laterality: N/A;  RM 21   CARDIAC CATHETERIZATION     CRANIOTOMY Right 10/26/2020   Procedure: Stereotactic Right  frontal craniotomy for resection of tumor with sonopet;  Surgeon: Consuella Lose, MD;  Location: [place];  Service: Neurosurgery;  Laterality: Right;   PORTACATH PLACEMENT Right 11/01/2019   Procedure: INSERTION PORT-A-CATH;  Surgeon: Aviva Signs, MD;  Location: [facility];  Service: General;  Laterality: Right;     Social History:   reports that he quit smoking about 6 years ago. His smoking use included cigars and cigarettes. He has never used smokeless tobacco. He reports that he does not currently use alcohol. He reports that he does not use drugs.   Family History:  His family history includes Dementia in his father; Heart disease in his father; Heart failure in an other family member; Hypertension in his father.   Allergies No Known Allergies   Home Medications  Prior to Admission medications   Medication Sig Start Date End Date Taking? Authorizing Provider  acetaminophen (TYLENOL) 500 MG tablet Take 500 mg by mouth every 6 (six) hours as needed for moderate pain or headache.   Yes [provider]  albuterol (VENTOLIN HFA) 108 (90 Base) MCG/ACT inhaler Inhale 1-2 puffs into the lungs every 6 (six) hours as  needed for wheezing or shortness of breath.   Yes [provider]  ALPRAZolam Duanne Moron) 0.25 MG tablet TAKE 1 TABLET BY MOUTH THREE TIMES DAILY AS NEEDED FOR ANXIETY 08/27/20  Yes Pennington, Rebekah M, PA-C  apixaban (ELIQUIS) 5 MG TABS tablet Take 1 tablet by mouth twice daily 12/03/20  Yes Branch, Alphonse Guild, MD  dextromethorphan (DELSYM) 30 MG/5ML liquid Take 30 mg by mouth as needed for cough.   Yes [provider]  levothyroxine (SYNTHROID) 100 MCG tablet Take 1 tablet (100 mcg total) by mouth daily before breakfast. 07/10/20  Yes Derek Jack, MD  lidocaine (XYLOCAINE) 2 % solution TAKE 15 ML BY MOUTH 4 TIMES DAILY WITH MEALS 11/07/20  Yes Derek Jack, MD  lidocaine-prilocaine (EMLA) cream APPLY SMALL AMOUNT TO PORTA-CATH SITE AND COVER WITH PLASTIC WRAP 1 HOUR PRIOR TO CHEMO APPOINTMENTS. 10/09/20  Yes Derek Jack, MD  megestrol (MEGACE) 400 MG/10ML suspension Take 10 mLs (400 mg total) by mouth 2 (two) times daily. 11/14/20  Yes Derek Jack, MD  methylphenidate (RITALIN) 5 MG tablet Take 1 tablet (5 mg total) by mouth daily. 12/28/20  Yes Derek Jack, MD  metoprolol tartrate (LOPRESSOR) 25 MG tablet Take 1 tablet (25 mg total) by mouth 2 (two) times daily. 11/09/20 02/07/21 Yes Verta Ellen., NP  HYDROcodone bit-homatropine [facility]) 5-1.5 MG/5ML syrup Take 15 mLs by mouth every 8 (eight) hours. Patient not taking: Reported on 12/29/2020 11/28/20   Derek Jack, MD  Melatonin 10 MG TABS Take 10 tablets by mouth at bedtime as needed (sleep). Patient not taking: Reported on 12/29/2020 08/29/20   Derek Jack, MD     Critical care time: 40 minutes

## 2020-12-29 NOTE — ED Notes (Signed)
O2 sat improved to 100% on NRB. Wife at bedside. Bear hugger in place.

## 2020-12-29 NOTE — H&P (Addendum)
History and Physical    Alan [name] VQQ:[id number] DOB: Dec 27, 1950 DOA: 12/29/2020  PCP: [facility] [place]  Patient coming from: [facility] ED  I have personally briefly reviewed patient's old medical records in [place]  Chief Complaint: Dyspnea, hypoxia  HPI: Alan [name] is a 70 y.o. male with medical history significant for metastatic lung cancer on active chemotherapy, s/p right frontal craniotomy for metastatic tumor resection, paroxysmal atrial fibrillation on Eliquis, hypertension, hypothyroidism who presented to [facility] ED for evaluation of shortness of breath.  Patient states that this morning (11/12) he was getting ready to eat breakfast when he started to feel lightheaded/dizzy and out of sorts.  He began to feel very short of breath and had to call EMS.  Per ED documentation, O2 sats were in the 50s on room air on EMS arrival.  He was placed on NRB with SPO2 improved up to 88%.  Heart rate was in the 130s-150s.  Patient was subsequently taken to [facility] ED.  Patient states that he does not require supplemental oxygen at home.  He has an albuterol inhaler to use as needed but has not required it for some time.  He reports a chronic cough occasionally productive of sputum which is unchanged from his baseline.  He denies any subjective fevers, chills, diaphoresis, chest pain, palpitations, nausea, vomiting, abdominal pain, dysuria, peripheral edema.  He is on chronic anticoagulation with Eliquis and denies any obvious bleeding.  He is on active chemotherapy with Taxotere and Cyramza.  Last treatment was 12/19/2020.  [facility] ED Course:  Initial vitals showed BP 70/50, pulse 162, RR 28, temp 97 degrees rectally, SPO2 89% on 15 L NRB.  Labs showed WBC 5.5, hemoglobin 9.0, platelets 230,000, sodium 134, potassium 3.9, bicarb 18, BUN 42, creatinine 1.44, serum glucose 163, LFTs within normal limits, BNP 950, high-sensitivity troponin 9 > 8, lactic acid  2.4 > 2.3.  COVID and influenza PCR's are negative.  Blood cultures collected and pending.  Portable chest x-ray showed homogenous opacity in the left apex.  Patchy infiltrates in the left mid and left lower lung fields and prominence of left hilum seen.  Small left pleural effusion also seen.  Patient was given 1 L NS, 2.5 L LR, IV ceftriaxone and azithromycin.  Patient was cardioverted in the ED successfully and returned to sinus rhythm.  Patient was persistently hypotensive therefore PCCM were consulted for ICU admission.  On arrival to ICU, patient has stabilized and PCCM requested [place] admission as patient appears to be stepdown appropriate.  Review of Systems: All systems reviewed and are negative except as documented in history of present illness above.   Past Medical History:  Diagnosis Date   Anxiety    Arthritis    Dysrhythmia    Hyperlipemia    Hypertension    Hyperthyroidism    Lung cancer ([place])    Myocardial infarction ([place])    Paroxysmal atrial fibrillation ([place])    Port-A-Cath in place 11/14/2019   Tachycardia     Past Surgical History:  Procedure Laterality Date   APPLICATION OF CRANIAL NAVIGATION N/A 10/26/2020   Procedure: APPLICATION OF CRANIAL NAVIGATION;  Surgeon: Consuella Lose, MD;  Location: [place];  Service: Neurosurgery;  Laterality: N/A;  RM 21   CARDIAC CATHETERIZATION     CRANIOTOMY Right 10/26/2020   Procedure: Stereotactic Right frontal craniotomy for resection of tumor with sonopet;  Surgeon: Consuella Lose, MD;  Location: [place];  Service: Neurosurgery;  Laterality: Right;   PORTACATH PLACEMENT Right 11/01/2019   Procedure: INSERTION PORT-A-CATH;  Surgeon: Aviva Signs, MD;  Location: [facility];  Service: General;  Laterality: Right;    Social History:  reports that he quit smoking about 6 years ago. His smoking use included cigars and cigarettes. He has never used smokeless tobacco. He reports that he does not currently use alcohol. He reports  that he does not use drugs.  No Known Allergies  Family History  Problem Relation Age of Onset   Hypertension Father    Dementia Father        died age 25   Heart disease Father    Heart failure Other      Prior to Admission medications   Medication Sig Start Date End Date Taking? Authorizing Provider  acetaminophen (TYLENOL) 500 MG tablet Take 500 mg by mouth every 6 (six) hours as needed for moderate pain or headache.   Yes [provider]  albuterol (VENTOLIN HFA) 108 (90 Base) MCG/ACT inhaler Inhale 1-2 puffs into the lungs every 6 (six) hours as needed for wheezing or shortness of breath.   Yes [provider]  ALPRAZolam Duanne Moron) 0.25 MG tablet TAKE 1 TABLET BY MOUTH THREE TIMES DAILY AS NEEDED FOR ANXIETY 08/27/20  Yes Pennington, Rebekah M, PA-C  apixaban (ELIQUIS) 5 MG TABS tablet Take 1 tablet by mouth twice daily 12/03/20  Yes Branch, Alphonse Guild, MD  dextromethorphan (DELSYM) 30 MG/5ML liquid Take 30 mg by mouth as needed for cough.   Yes [provider]  levothyroxine (SYNTHROID) 100 MCG tablet Take 1 tablet (100 mcg total) by mouth daily before breakfast. 07/10/20  Yes Derek Jack, MD  lidocaine (XYLOCAINE) 2 % solution TAKE 15 ML BY MOUTH 4 TIMES DAILY WITH MEALS 11/07/20  Yes Derek Jack, MD  lidocaine-prilocaine (EMLA) cream APPLY SMALL AMOUNT TO PORTA-CATH SITE AND COVER WITH PLASTIC WRAP 1 HOUR PRIOR TO CHEMO APPOINTMENTS. 10/09/20  Yes Derek Jack, MD  megestrol (MEGACE) 400 MG/10ML suspension Take 10 mLs (400 mg total) by mouth 2 (two) times daily. 11/14/20  Yes Derek Jack, MD  methylphenidate (RITALIN) 5 MG tablet Take 1 tablet (5 mg total) by mouth daily. 12/28/20  Yes Derek Jack, MD  metoprolol tartrate (LOPRESSOR) 25 MG tablet Take 1 tablet (25 mg total) by mouth 2 (two) times daily. 11/09/20 02/07/21 Yes Verta Ellen., NP  HYDROcodone bit-homatropine [facility]) 5-1.5 MG/5ML syrup Take 15 mLs by  mouth every 8 (eight) hours. Patient not taking: Reported on 12/29/2020 11/28/20   Derek Jack, MD  Melatonin 10 MG TABS Take 10 tablets by mouth at bedtime as needed (sleep). Patient not taking: Reported on 12/29/2020 08/29/20   Derek Jack, MD    Physical Exam: Vitals:   12/29/20 1600 12/29/20 1615 12/29/20 1700 12/29/20 1900  BP: 105/68 108/72 97/68 (!) 110/54  Pulse: 70 73 73 73  Resp: 20 (!) 23 (!) 25 (!) 25  Temp:      TempSrc:      SpO2: 100% 100% 100% 100%  Weight:      Height:       Constitutional: Sitting up in bed, NAD, calm, comfortable Eyes: PERRL, lids and conjunctivae normal ENMT: Mucous membranes are moist. Posterior pharynx clear of any exudate or lesions. Voice is hoarse. Neck: normal, supple, no masses. Respiratory: Coarse rhonchi greater on the left with bilateral expiratory wheezing.  Normal respiratory effort while on 4 L supplemental O2 via [place]. No accessory muscle use.  Cardiovascular: Regular  rate and rhythm, no murmurs / rubs / gallops. No extremity edema. 2+ pedal pulses.  Port-A-Cath in place right upper chest wall without surrounding edema or erythema. Abdomen: no tenderness, no masses palpated. No hepatosplenomegaly. Bowel sounds positive.  Musculoskeletal: no clubbing / cyanosis. No joint deformity upper and lower extremities. Good ROM, no contractures. Normal muscle tone.  Skin: Well-healed craniotomy scar. No induration Neurologic: CN 2-12 grossly intact. Sensation intact. Strength 5/5 in all 4.  Psychiatric: Normal judgment and insight. Alert and oriented x 3. Normal mood.   Labs on Admission: I have personally reviewed following labs and imaging studies  CBC: Recent Labs  Lab 12/29/20 1011  WBC 5.5  NEUTROABS 3.8  HGB 9.0*  HCT 27.4*  MCV 96.5  PLT 932   Basic Metabolic Panel: Recent Labs  Lab 12/29/20 1011  NA 134*  K 3.9  CL 104  CO2 18*  GLUCOSE 163*  BUN 42*  CREATININE 1.44*  CALCIUM 8.3*   GFR: Estimated  Creatinine Clearance: 42.9 mL/min (A) (by C-G formula based on SCr of 1.44 mg/dL (H)). Liver Function Tests: Recent Labs  Lab 12/29/20 1011  AST 24  ALT 21  ALKPHOS 53  BILITOT 1.1  PROT 6.3*  ALBUMIN 2.6*   No results for input(s): LIPASE, AMYLASE in the last 168 hours. No results for input(s): AMMONIA in the last 168 hours. Coagulation Profile: No results for input(s): INR, PROTIME in the last 168 hours. Cardiac Enzymes: No results for input(s): CKTOTAL, CKMB, CKMBINDEX, TROPONINI in the last 168 hours. BNP (last 3 results) No results for input(s): PROBNP in the last 8760 hours. HbA1C: No results for input(s): HGBA1C in the last 72 hours. CBG: No results for input(s): GLUCAP in the last 168 hours. Lipid Profile: No results for input(s): CHOL, HDL, LDLCALC, TRIG, CHOLHDL, LDLDIRECT in the last 72 hours. Thyroid Function Tests: No results for input(s): TSH, T4TOTAL, FREET4, T3FREE, THYROIDAB in the last 72 hours. Anemia Panel: No results for input(s): VITAMINB12, FOLATE, FERRITIN, TIBC, IRON, RETICCTPCT in the last 72 hours. Urine analysis:    Component Value Date/Time   COLORURINE AMBER (A) 12/19/2020 0930   APPEARANCEUR HAZY (A) 12/19/2020 0930   LABSPEC 1.030 12/19/2020 0930   PHURINE 5.0 12/19/2020 0930   GLUCOSEU NEGATIVE 12/19/2020 0930   HGBUR NEGATIVE 12/19/2020 0930   BILIRUBINUR NEGATIVE 12/19/2020 0930   KETONESUR 5 (A) 12/19/2020 0930   PROTEINUR 30 (A) 12/19/2020 0930   NITRITE NEGATIVE 12/19/2020 0930   LEUKOCYTESUR NEGATIVE 12/19/2020 0930    Radiological Exams on Admission: DG Chest Port 1 View  Result Date: 12/29/2020 CLINICAL DATA:  Shortness of breath EXAM: PORTABLE CHEST 1 VIEW COMPARISON:  Previous studies including the examination of radiographs done on 11/01/2019 and CT done on 09/05/2020 FINDINGS: Transverse diameter of heart is increased. There is homogeneous opacification in the left apex. There are patchy infiltrates in the left mid and  left lower lung fields. There is blunting of left lateral CP angle. Right lung is clear. There is no pneumothorax. IMPRESSION: There is homogeneous opacity in the left apex. There are patchy infiltrates in the left mid and left lower lung fields and prominence of left hilum. Findings suggest pneumonia with possible underlying neoplastic process. Small left pleural effusion is seen. Electronically Signed   By: Elmer Picker M.D.   On: 12/29/2020 11:10    EKG: Personally reviewed. Initial EKG showed A. fib with RVR, rate 159.  Post cardioversion EKG showed sinus rhythm with PACs, rate 77.  Assessment/Plan Principal Problem:   Acute respiratory failure with hypoxia (HCC) Active Problems:   Paroxysmal atrial fibrillation with RVR (HCC)   Lung cancer (HCC)   AKI (acute kidney injury) ([place])   Anemia associated with chemotherapy   Hypotension   SHAHEEM PICHON is a 70 y.o. male with medical history significant for metastatic lung cancer on active chemotherapy, s/p right frontal craniotomy for metastatic tumor resection, paroxysmal atrial fibrillation on Eliquis, hypertension, hypothyroidism who is admitted with acute respiratory failure with hypoxia.  Acute respiratory failure with hypoxia: Initially requiring 15 L via NRB, now weaned down to 4 L O2 via [place].  PCCM following.  Concern is for chemotherapy associated pulmonary disease.  He was started on antibiotics for potential superimposed pneumonia. -Start IV Solu-Medrol 40 mg twice daily per pulmonology -Continue empiric IV ceftriaxone and azithromycin for now -Follow blood cultures -Continue supplemental oxygen as needed and wean as able  Paroxysmal atrial fibrillation with RVR: Presented to ED with A. fib RVR, rate 150-160s.  Cardioverted in the ED with return to sinus rhythm. -Continue Eliquis -Resume Lopressor as BP allows  Hypotension/history of hypertension: Hypotension improved after cardioversion and IV fluid resuscitation.  Has  not required pressor support.  Resume Lopressor as BP allows.  Acute kidney injury: Creatinine 1.44 on admission compared to baseline 1.0-1.1.  Likely due to hypotensive event.  S/p 3.5 L IV fluids.  Will hold further fluids for now and repeat labs in AM.  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 Delton Coombes.  On active chemotherapy with Taxotere and Cyramza.  Anemia: Hemoglobin 9.0 on admission, slightly decreased from baseline.  Denies any obvious bleeding.  Suspect some component of chemotherapy associated anemia.  Hypothyroidism: Continue Synthroid.  Anxiety: Continue home Xanax as needed with hold parameters.  DVT prophylaxis: Eliquis Code Status: Full code, confirmed with patient on admission Family Communication: Discussed with patient's spouse and daughter at bedside Disposition Plan: From home, dispo pending clinical progress Consults called: PCCM Level of care: Stepdown Admission status:  Status is: Inpatient  Remains inpatient appropriate because: High risk patient presenting with acute hypoxic respiratory failure in setting of metastatic lung cancer with possible chemotherapy associated pulmonary disease, atrial fibrillation with RVR, hypotension, and AKI requiring continued supplemental oxygen, IV antibiotics, and IV steroids.   Zada Finders MD [facility]  If 7PM-7AM, please contact night-coverage [email or web address]  12/29/2020, 8:00 PM

## 2020-12-29 NOTE — ED Notes (Signed)
EDP aware of bp dropping. New orders placed for 1094ml bolus ns

## 2020-12-29 NOTE — Sedation Documentation (Signed)
Family (wife) updated as to patient's status. 

## 2020-12-29 NOTE — Plan of Care (Signed)

## 2020-12-29 NOTE — ED Notes (Signed)
RT attempted ABG x2 with doppler assistance, unable to get sample. EDP updated. Pt noted to be 100% on NRB, titrated down to 4% on [place] and sats remain 100%. Fluid bolus almost complete, HR remains 140s-170s. EDP updated on all, news orders for additional fluid bolus.

## 2020-12-29 NOTE — Sedation Documentation (Signed)
Etomidate 10mg given

## 2020-12-29 NOTE — ED Notes (Signed)
Date and time results received: 12/29/20 1254 (use smartphrase ".now" to insert current time)  Test: Lactic Critical Value: 2.3  Name of Provider Notified: Dr Almyra Free   Orders Received? Or Actions Taken?: Orders Received - See Orders for details

## 2020-12-29 NOTE — ED Triage Notes (Signed)
Pt arrives via [facility] from home. Family called d/t pt with SOB while walking into restaurant. EMS states pt O2 sats in 50s on RA and only came up to 88% on NRB. Pt has hx of lung cancer to left lung with last tx done last week per EMS. HR noted by EMS to ranges from 130s-150s

## 2020-12-29 NOTE — ED Notes (Signed)
Bear hugger removed d/t temp elevation WDL

## 2020-12-29 NOTE — Sedation Documentation (Signed)
Shock administered at 200J by Dr. Almyra Free.

## 2020-12-29 NOTE — ED Notes (Signed)
Pt resting talked with family member advised we needed urine sample when pt woke up

## 2020-12-30 ENCOUNTER — Inpatient Hospital Stay (HOSPITAL_COMMUNITY): Payer: Medicare Other

## 2020-12-30 ENCOUNTER — Telehealth: Payer: Self-pay | Admitting: Pulmonary Disease

## 2020-12-30 DIAGNOSIS — C801 Malignant (primary) neoplasm, unspecified: Secondary | ICD-10-CM | POA: Diagnosis not present

## 2020-12-30 DIAGNOSIS — R7881 Bacteremia: Secondary | ICD-10-CM | POA: Diagnosis not present

## 2020-12-30 DIAGNOSIS — D6481 Anemia due to antineoplastic chemotherapy: Secondary | ICD-10-CM

## 2020-12-30 DIAGNOSIS — C3412 Malignant neoplasm of upper lobe, left bronchus or lung: Secondary | ICD-10-CM | POA: Diagnosis not present

## 2020-12-30 DIAGNOSIS — I480 Paroxysmal atrial fibrillation: Secondary | ICD-10-CM

## 2020-12-30 DIAGNOSIS — J9601 Acute respiratory failure with hypoxia: Secondary | ICD-10-CM

## 2020-12-30 DIAGNOSIS — T451X5A Adverse effect of antineoplastic and immunosuppressive drugs, initial encounter: Secondary | ICD-10-CM | POA: Diagnosis not present

## 2020-12-30 DIAGNOSIS — B9561 Methicillin susceptible Staphylococcus aureus infection as the cause of diseases classified elsewhere: Secondary | ICD-10-CM

## 2020-12-30 DIAGNOSIS — N179 Acute kidney failure, unspecified: Secondary | ICD-10-CM

## 2020-12-30 LAB — BLOOD CULTURE ID PANEL (REFLEXED) - BCID2
A.calcoaceticus-baumannii: NOT DETECTED
Bacteroides fragilis: NOT DETECTED
Candida albicans: NOT DETECTED
Candida auris: NOT DETECTED
Candida glabrata: NOT DETECTED
Candida krusei: NOT DETECTED
Candida parapsilosis: NOT DETECTED
Candida tropicalis: NOT DETECTED
Cryptococcus neoformans/gattii: NOT DETECTED
Enterobacter cloacae complex: NOT DETECTED
Enterobacterales: NOT DETECTED
Enterococcus Faecium: NOT DETECTED
Enterococcus faecalis: NOT DETECTED
Escherichia coli: NOT DETECTED
Haemophilus influenzae: NOT DETECTED
Klebsiella aerogenes: NOT DETECTED
Klebsiella oxytoca: NOT DETECTED
Klebsiella pneumoniae: NOT DETECTED
Listeria monocytogenes: NOT DETECTED
Meth resistant mecA/C and MREJ: NOT DETECTED
Methicillin resistance mecA/C: DETECTED — AB
Neisseria meningitidis: NOT DETECTED
Proteus species: NOT DETECTED
Pseudomonas aeruginosa: NOT DETECTED
Salmonella species: NOT DETECTED
Serratia marcescens: NOT DETECTED
Staphylococcus aureus (BCID): DETECTED — AB
Staphylococcus epidermidis: DETECTED — AB
Staphylococcus lugdunensis: NOT DETECTED
Staphylococcus species: DETECTED — AB
Stenotrophomonas maltophilia: NOT DETECTED
Streptococcus agalactiae: NOT DETECTED
Streptococcus pneumoniae: NOT DETECTED
Streptococcus pyogenes: NOT DETECTED
Streptococcus species: NOT DETECTED

## 2020-12-30 LAB — CBC
HCT: 23.2 % — ABNORMAL LOW (ref 39.0–52.0)
Hemoglobin: 7.3 g/dL — ABNORMAL LOW (ref 13.0–17.0)
MCH: 30.2 pg (ref 26.0–34.0)
MCHC: 31.5 g/dL (ref 30.0–36.0)
MCV: 95.9 fL (ref 80.0–100.0)
Platelets: 198 10*3/uL (ref 150–400)
RBC: 2.42 MIL/uL — ABNORMAL LOW (ref 4.22–5.81)
RDW: 16.3 % — ABNORMAL HIGH (ref 11.5–15.5)
WBC: 7.2 10*3/uL (ref 4.0–10.5)
nRBC: 2.6 % — ABNORMAL HIGH (ref 0.0–0.2)

## 2020-12-30 LAB — URINALYSIS, ROUTINE W REFLEX MICROSCOPIC
Bilirubin Urine: NEGATIVE
Glucose, UA: NEGATIVE mg/dL
Hgb urine dipstick: NEGATIVE
Ketones, ur: NEGATIVE mg/dL
Leukocytes,Ua: NEGATIVE
Nitrite: NEGATIVE
Protein, ur: NEGATIVE mg/dL
Specific Gravity, Urine: 1.021 (ref 1.005–1.030)
pH: 5 (ref 5.0–8.0)

## 2020-12-30 LAB — ECHOCARDIOGRAM COMPLETE
AR max vel: 2.12 cm2
AV Area VTI: 1.95 cm2
AV Area mean vel: 2.13 cm2
AV Mean grad: 2 mmHg
AV Peak grad: 3.8 mmHg
Ao pk vel: 0.98 m/s
Area-P 1/2: 4.57 cm2
Height: 64 in
S' Lateral: 3 cm
Weight: 2553.81 oz

## 2020-12-30 LAB — BASIC METABOLIC PANEL
Anion gap: 9 (ref 5–15)
BUN: 27 mg/dL — ABNORMAL HIGH (ref 8–23)
CO2: 20 mmol/L — ABNORMAL LOW (ref 22–32)
Calcium: 8.1 mg/dL — ABNORMAL LOW (ref 8.9–10.3)
Chloride: 106 mmol/L (ref 98–111)
Creatinine, Ser: 0.98 mg/dL (ref 0.61–1.24)
GFR, Estimated: >60 mL/min (ref >60)
Glucose, Bld: 142 mg/dL — ABNORMAL HIGH (ref 70–99)
Potassium: 4.3 mmol/L (ref 3.5–5.1)
Sodium: 135 mmol/L (ref 135–145)

## 2020-12-30 LAB — GLUCOSE, CAPILLARY: Glucose-Capillary: 243 mg/dL — ABNORMAL HIGH (ref 70–99)

## 2020-12-30 LAB — MRSA NEXT GEN BY PCR, NASAL: MRSA by PCR Next Gen: NOT DETECTED

## 2020-12-30 MED ORDER — SODIUM CHLORIDE 0.9 % IV SOLN: INTRAVENOUS | Status: DC | PRN

## 2020-12-30 MED ORDER — MICONAZOLE NITRATE 2 % EX CREA
TOPICAL_CREAM | Freq: Two times a day (BID) | CUTANEOUS | Status: DC
  Administered 2020-12-30: 1 via TOPICAL
  Filled 2020-12-30 (×2): qty 28

## 2020-12-30 MED ORDER — IPRATROPIUM-ALBUTEROL 0.5-2.5 (3) MG/3ML IN SOLN
3.0000 mL | Freq: Two times a day (BID) | RESPIRATORY_TRACT | Status: DC
  Administered 2020-12-30 – 2021-01-01 (×4): 3 mL via RESPIRATORY_TRACT
  Filled 2020-12-30 (×6): qty 3

## 2020-12-30 MED ORDER — INSULIN ASPART 100 UNIT/ML IJ SOLN
0.0000 [IU] | Freq: Three times a day (TID) | INTRAMUSCULAR | Status: DC
  Administered 2020-12-31: 1 [IU] via SUBCUTANEOUS
  Administered 2020-12-31: 2 [IU] via SUBCUTANEOUS
  Administered 2020-12-31 – 2021-01-03 (×7): 1 [IU] via SUBCUTANEOUS
  Administered 2021-01-04: 2 [IU] via SUBCUTANEOUS

## 2020-12-30 MED ORDER — VANCOMYCIN HCL 1000 MG/200ML IV SOLN
1000.0000 mg | INTRAVENOUS | Status: DC
  Administered 2020-12-31: 1000 mg via INTRAVENOUS
  Filled 2020-12-30: qty 200

## 2020-12-30 MED ORDER — IPRATROPIUM-ALBUTEROL 0.5-2.5 (3) MG/3ML IN SOLN
3.0000 mL | Freq: Four times a day (QID) | RESPIRATORY_TRACT | Status: DC
  Administered 2020-12-30: 3 mL via RESPIRATORY_TRACT
  Filled 2020-12-30: qty 3

## 2020-12-30 MED ORDER — VANCOMYCIN HCL 1500 MG/300ML IV SOLN
1500.0000 mg | Freq: Once | INTRAVENOUS | Status: AC
  Administered 2020-12-30: 1500 mg via INTRAVENOUS
  Filled 2020-12-30: qty 300

## 2020-12-30 MED ORDER — INSULIN ASPART 100 UNIT/ML IJ SOLN
0.0000 [IU] | Freq: Every day | INTRAMUSCULAR | Status: DC
Start: 2020-12-30 — End: 2021-01-05
  Administered 2020-12-30 – 2021-01-04 (×3): 2 [IU] via SUBCUTANEOUS

## 2020-12-30 NOTE — Progress Notes (Signed)
NAME:  Alan [name], MRN:  [id number], DOB:  1950/12/04, LOS: 1 ADMISSION DATE:  12/29/2020, CONSULTATION DATE:  12/30/2020  REFERRING MD:  APED, Dare , CHIEF COMPLAINT:  hypotension, hypoxia   History of Present Illness:  70 year old ex-smoker with left-sided lung cancer status post chemoradiation, now on second line chemotherapy due to disease progression brought in by EMS from local store to [facility], ED with hypoxia saturation 70% on room air , found to be in atrial fibrillation/RVR with soft blood pressure 92 ystolic, underwent DCCV with conversion to sinus rhythm but hypotension persisted hence transferred to [facility], ICU.  Patient is currently in sinus rhythm.  Patient currently denies any chest pain/chest pressure/palpitations/nausea/vomiting/diarrhea/hemoptysis/hematemesis/hematochezia.  No fevers or rigors. He does complain of chills.  He has a baseline cough which is unchanged.  He develops dyspnea on exertion that is unchanged over the past 1 week.  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 He had disease progression with brain mets and underwent craniotomy and resection of solitary brain met and was switched to docetaxel & ramucirumab -cycle 1 was on 8/17 and cycle 2 was on 11/2 , completed Keppra and dexamethasone  Significant Hospital Events: Including procedures, antibiotic start and stop dates in addition to other pertinent events   11/12 DCCV at [facility], ED for A. fib/RVR with hypotension  Interim History / Subjective:  Afebrile Breathing better No chest pain Cough but "nothing comes up"  Objective   Blood pressure (!) 107/59, pulse 74, temperature 98.3 F (36.8 C), temperature source Oral, resp.  rate (!) 30, height 5' 4"  (1.626 m), weight 72.4 kg, SpO2 96 %.        Intake/Output Summary (Last 24 hours) at 12/30/2020 0837 Last data filed at 12/29/2020 2300 Gross per 24 hour  Intake 2350.33 ml  Output --  Net 2350.33 ml    Filed Weights   12/29/20 1014  Weight: 72.4 kg    Examination: General: Sitting in bed, no distress HENT: Atraumatic/normocephalic mucous membranes are moist.  No significant JVD. Lungs: No accessory muscle use, minimal scattered rhonchi Cardiovascular: Regular rate no murmur rub or gallop appreciated. Abdomen: Soft, nondistended, nontender, no rebound/rigidity/guarding.  Positive bowel sounds. Extremities: Distal pulse intact x4.  No significant edema of the extremities. Neuro: Alert oriented x3, mild weakness left leg compared to right, chronic per patient   Labs show slight high BNP, negative troponin, no leukocytosis, worsening anemia with mild hyponatremia and slight high creatinine  Assessment & Plan:      Acute on chronic hypoxic respiratory failure secondary to metastatic lung CA. 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  -Patchy infiltrates in left mid and lower lung fields?  Pneumonia versus chronic postradiation changes Other possibility includes pneumonitis related to checkpoint inhibitor therapy -note that groundglass infiltrates were noted in his right lung on CT chest 10/29, per review of records he only received docetaxel on 11/2  -Would suggest use empiric steroids, Solu-Medrol 40 every 12 which can be tapered to 40 mg of prednisone over 2 weeks if his hypoxia improves -Continue ceftriaxone and azithromycin for 5 days -Outpatient pulmonary clinic follow-up in [place]   Atrial fibrillation/RVR -Per primary service Anemia associated with chemotherapy   PCCM will be available as  needed  Best Practice (right click and "Reselect all SmartList Selections" daily)   Diet/type: Regular  consistency (see orders) DVT prophylaxis:  GI prophylaxis: N/A Lines: N/A Foley:  N/A Code Status:  full code Last date of multidisciplinary goals of care discussion []   Labs   CBC: Recent Labs  Lab 12/29/20 1011 12/30/20 0746  WBC 5.5 7.2  NEUTROABS 3.8  --   HGB 9.0* 7.3*  HCT 27.4* 23.2*  MCV 96.5 95.9  PLT 230 198     Basic Metabolic Panel: Recent Labs  Lab 12/29/20 1011  NA 134*  K 3.9  CL 104  CO2 18*  GLUCOSE 163*  BUN 42*  CREATININE 1.44*  CALCIUM 8.3*    GFR: Estimated Creatinine Clearance: 42.9 mL/min (A) (by C-G formula based on SCr of 1.44 mg/dL (H)). Recent Labs  Lab 12/29/20 1011 12/29/20 1207 12/30/20 0746  WBC 5.5  --  7.2  LATICACIDVEN 2.4* 2.3*  --      Liver Function Tests: Recent Labs  Lab 12/29/20 1011  AST 24  ALT 21  ALKPHOS 53  BILITOT 1.1  PROT 6.3*  ALBUMIN 2.6*    No results for input(s): LIPASE, AMYLASE in the last 168 hours. No results for input(s): AMMONIA in the last 168 hours.  ABG    Component Value Date/Time   PHART 7.449 10/26/2020 0930   PCO2ART 32.2 10/26/2020 0930   PO2ART 394 (H) 10/26/2020 0930   HCO3 22.9 10/26/2020 0930   TCO2 24 10/26/2020 0930   ACIDBASEDEF 1.0 10/26/2020 0930   O2SAT 100.0 10/26/2020 0930      Coagulation Profile: No results for input(s): INR, PROTIME in the last 168 hours.  Cardiac Enzymes: No results for input(s): CKTOTAL, CKMB, CKMBINDEX, TROPONINI in the last 168 hours.  HbA1C: Hgb A1c MFr Bld  Date/Time Value Ref Range Status  11/14/2020 08:47 AM 6.6 (H) 4.8 - 5.6 % Final    Comment:    (NOTE)         Prediabetes: 5.7 - 6.4         Diabetes: >6.4         Glycemic control for adults with diabetes: <7.0   12/25/2010 04:39 AM 5.9 (H) <5.7 % Final    Comment:    (NOTE)                                                                       According to the ADA Clinical Practice Recommendations for 2011, when HbA1c is used as a screening test:  >=6.5%    Diagnostic of Diabetes Mellitus           (if abnormal result is confirmed) 5.7-6.4%   Increased risk of developing Diabetes Mellitus References:Diagnosis and Classification of Diabetes Mellitus,Diabetes SFKC,1275,17(GYFVC 1):S62-S69 and Standards of Medical Care in         Diabetes - 2011,Diabetes BSWH,6759,16 (Suppl 1):S11-S61.    CBG: No results for input(s): GLUCAP in the last 168 hours.   Kara Mead MD. Shade Flood. [facility] Pager : [phone]  If no response to pager , please call [phone] until 7 pm After 7:00 pm call Elink  [phone]   12/30/2020

## 2020-12-30 NOTE — Progress Notes (Signed)
PROGRESS NOTE  Alan [name] GXQ:[id number] DOB: [birth year] DOA: 12/29/2020 PCP: [facility] [place] [facility]  HPI/Recap of past 24 hours: Alan [name] is a 70 y.o. male with medical history significant for metastatic lung cancer on active chemotherapy, s/p right frontal craniotomy for metastatic tumor resection, paroxysmal Afib on Eliquis, HTN, hypothyroidism who presented to [facility] ED for evaluation worsening SOB for the past few days. Noted unchanged chronic cough. Per ED documentation, O2 sats were in the 50s on room air on EMS arrival.  He was placed on NRB with SPO2 improved up to 88%.  Heart rate was in the 130s-150s.  Patient was subsequently taken to [facility] ED. In the ED, initial vitals showed BP 70/50, pulse 162, RR 28, temp 97 degrees rectally, SPO2 89% on 15 L NRB. Labs showed WBC 5.5, BNP 950, lactic acid 2.4 > 2.3.  COVID and influenza PCR's are negative. Portable chest x-ray showed homogenous opacity in the left apex. Patient was cardioverted in the ED successfully and returned to sinus rhythm.  Patient was persistently hypotensive therefore PCCM were consulted. Prior to ICU admission, pt stabilized and TRH consulted for admission. Of note, pt is on active chemotherapy with Taxotere and Cyramza.      Today, pt still SOB, tachypneic, currently on 2L of O2, denies any chest pain, abdominal pain, N/V, fever/chills. Discussed with wife at bedside      Assessment/Plan: Principal Problem:   Acute respiratory failure with hypoxia (HCC) Active Problems:   Paroxysmal atrial fibrillation with RVR (HCC)   Lung cancer (HCC)   AKI (acute kidney injury) (Seth Ward)   Anemia associated with chemotherapy   Hypotension   Staphylococcus aureus bacteremia   Malignancy (Alan [name])   Acute respiratory failure with hypoxia likely 2/2 CAP ?Chemotherapy pneumonitis Initially requiring 15 L via NRB, now weaned down to 2 L O2 via [place] Currently afebrile, with no leukocytosis Sputum culture,  urine strep/Legionella antigen pending Chest x-ray with new left lung opacity Continue ceftriaxone, azithromycin Continue IV Solu-Medrol 40 mg twice daily per pulmonology Duo nebs, incentive spirometry Continue supplemental oxygen as needed and wean as able  Sepsis likely 2/2 ??staph aureus bacteremia On admission, noted to be tachycardic, leukocytosis, lactic acidosis, hypotensive BC x2-noted MRSE, staph aureus, awaiting final culture Repeat BC x2 pending ID on board, recommend awaiting final culture, continue vanc, if confirmed bacteremia, right chest port will need to be removed Echo pending, may need TEE Continue vancomycin Monitor closely  Paroxysmal atrial fibrillation with RVR Cardioverted in the ED, now in sinus rhythm Continue metoprolol, Eliquis   Hypotension/history of hypertension Hypotension resolved Continue metoprolol   Acute kidney injury Improved Creatinine 1.44 on admission compared to baseline 1.0-1.1 Daily BMP  ?Diabetes mellitus type 2 Last A1c noted to be 6.6 SSI, Accu-Cheks, hypoglycemic protocol   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 Delton Coombes.  On active chemotherapy with Taxotere and Cyramza Last treatment was 12/19/2020   Anemia of chronic disease/chemo associated Hemoglobin 9.0 on admission, baseline around 11  Worsened by hemodilution Denies any recent bleeding Anemia panel pending Daily CBC  Hypothyroidism Continue Synthroid   Anxiety: Continue home Xanax as needed    Estimated body mass index is 27.4 kg/m as calculated from the following:   Height as of this encounter: 5\' 4"  (1.626 m).   Weight as of this encounter: 72.4 kg.     Code Status: Full  Family Communication: Wife  at bedside  Disposition Plan: Status is: Inpatient  Remains inpatient appropriate because: Level of  care     Consultants: PCCM  Procedures: None  Antimicrobials: Azithromycin Ceftriaxone Vancomycin  DVT prophylaxis: Eliquis   Objective: Vitals:   12/30/20 1200 12/30/20 1300 12/30/20 1318 12/30/20 1400  BP: 113/65 115/71  115/78  Pulse: 70 (!) 102  81  Resp: (!) 22 20  (!) 36  Temp: 97.8 F (36.6 C)     TempSrc: Oral     SpO2: 100% 96% 100% 100%  Weight:      Height:        Intake/Output Summary (Last 24 hours) at 12/30/2020 1634 Last data filed at 12/30/2020 1400 Gross per 24 hour  Intake 931.27 ml  Output --  Net 931.27 ml   Filed Weights   12/29/20 1014  Weight: 72.4 kg    Exam: General: NAD  Cardiovascular: S1, S2 present Respiratory: Diminished breath sounds bilaterally Abdomen: Soft, nontender, nondistended, bowel sounds present, noted groin rash Musculoskeletal: No bilateral pedal edema noted Skin: As above Psychiatry: Normal mood     Data Reviewed: CBC: Recent Labs  Lab 12/29/20 1011 12/30/20 0746  WBC 5.5 7.2  NEUTROABS 3.8  --   HGB 9.0* 7.3*  HCT 27.4* 23.2*  MCV 96.5 95.9  PLT 230 154   Basic Metabolic Panel: Recent Labs  Lab 12/29/20 1011 12/30/20 0746  NA 134* 135  K 3.9 4.3  CL 104 106  CO2 18* 20*  GLUCOSE 163* 142*  BUN 42* 27*  CREATININE 1.44* 0.98  CALCIUM 8.3* 8.1*   GFR: Estimated Creatinine Clearance: 63.1 mL/min (by C-G formula based on SCr of 0.98 mg/dL). Liver Function Tests: Recent Labs  Lab 12/29/20 1011  AST 24  ALT 21  ALKPHOS 53  BILITOT 1.1  PROT 6.3*  ALBUMIN 2.6*   No results for input(s): LIPASE, AMYLASE in the last 168 hours. No results for input(s): AMMONIA in the last 168 hours. Coagulation Profile: No results for input(s): INR, PROTIME in the last 168 hours. Cardiac Enzymes: No results for input(s): CKTOTAL, CKMB, CKMBINDEX, TROPONINI in the last 168 hours. BNP (last 3 results) No results for input(s): PROBNP in the last 8760 hours. HbA1C: No results for input(s): HGBA1C  in the last 72 hours. CBG: No results for input(s): GLUCAP in the last 168 hours. Lipid Profile: No results for input(s): CHOL, HDL, LDLCALC, TRIG, CHOLHDL, LDLDIRECT in the last 72 hours. Thyroid Function Tests: No results for input(s): TSH, T4TOTAL, FREET4, T3FREE, THYROIDAB in the last 72 hours. Anemia Panel: No results for input(s): VITAMINB12, FOLATE, FERRITIN, TIBC, IRON, RETICCTPCT in the last 72 hours. Urine analysis:    Component Value Date/Time   COLORURINE AMBER (A) 12/19/2020 0930   APPEARANCEUR HAZY (A) 12/19/2020 0930   LABSPEC 1.030 12/19/2020 0930   PHURINE 5.0 12/19/2020 0930   GLUCOSEU NEGATIVE 12/19/2020 0930   HGBUR NEGATIVE 12/19/2020 0930   BILIRUBINUR NEGATIVE 12/19/2020 0930   KETONESUR 5 (A) 12/19/2020 0930   PROTEINUR 30 (A) 12/19/2020 0930   NITRITE NEGATIVE 12/19/2020 0930   LEUKOCYTESUR NEGATIVE 12/19/2020 0930   Sepsis Labs: @LABRCNTIP (procalcitonin:4,lacticidven:4)  ) Recent Results (from the past 240 hour(s))  Resp Panel by RT-PCR (Flu A&B, Covid) Nasopharyngeal Swab     Status: None   Collection Time: 12/29/20 10:12 AM   Specimen: Nasopharyngeal Swab; Nasopharyngeal(NP) swabs in vial transport medium  Result Value Ref Range Status   SARS Coronavirus 2 by RT PCR NEGATIVE NEGATIVE Final  Comment: (NOTE) SARS-CoV-2 target nucleic acids are NOT DETECTED.  The SARS-CoV-2 RNA is generally detectable in upper respiratory specimens during the acute phase of infection. The lowest concentration of SARS-CoV-2 viral copies this assay can detect is 138 copies/mL. A negative result does not preclude SARS-Cov-2 infection and should not be used as the sole basis for treatment or other patient management decisions. A negative result may occur with  improper specimen collection/handling, submission of specimen other than nasopharyngeal swab, presence of viral mutation(s) within the areas targeted by this assay, and inadequate number of viral copies(<138  copies/mL). A negative result must be combined with clinical observations, patient history, and epidemiological information. The expected result is Negative.  Fact Sheet for Patients:  EntrepreneurPulse.com.au  Fact Sheet for Healthcare Providers:  IncredibleEmployment.be  This test is no t yet approved or cleared by the [place] FDA and  has been authorized for detection and/or diagnosis of SARS-CoV-2 by FDA under an Emergency Use Authorization (EUA). This EUA will remain  in effect (meaning this test can be used) for the duration of the COVID-19 declaration under Section 564(b)(1) of the Act, 21 U.S.C.section 360bbb-3(b)(1), unless the authorization is terminated  or revoked sooner.       Influenza A by PCR NEGATIVE NEGATIVE Final   Influenza B by PCR NEGATIVE NEGATIVE Final    Comment: (NOTE) The Xpert Xpress SARS-CoV-2/FLU/RSV plus assay is intended as an aid in the diagnosis of influenza from Nasopharyngeal swab specimens and should not be used as a sole basis for treatment. Nasal washings and aspirates are unacceptable for Xpert Xpress SARS-CoV-2/FLU/RSV testing.  Fact Sheet for Patients: EntrepreneurPulse.com.au  Fact Sheet for Healthcare Providers: IncredibleEmployment.be  This test is not yet approved or cleared by the [place] FDA and has been authorized for detection and/or diagnosis of SARS-CoV-2 by FDA under an Emergency Use Authorization (EUA). This EUA will remain in effect (meaning this test can be used) for the duration of the COVID-19 declaration under Section 564(b)(1) of the Act, 21 U.S.C. section 360bbb-3(b)(1), unless the authorization is terminated or revoked.  Performed at [facility], [address], [place], [place] [address]   Culture, blood (routine x 2)     Status: None (Preliminary result)   Collection Time: 12/29/20 10:38 AM   Specimen: BLOOD RIGHT HAND   Result Value Ref Range Status   Specimen Description   Final    BLOOD RIGHT HAND BOTTLES DRAWN AEROBIC AND ANAEROBIC Performed at [facility], [address] [place], [place] [address]    Special Requests   Final    Blood Culture adequate volume Performed at [place] [place], [place] [address]    Culture  Setup Time   Final    GRAM POSITIVE COCCI IN BOTH AEROBIC AND ANAEROBIC BOTTLES Gram Stain Report Called to,Read Back By and Verified With: SIMPSON K. AT [facility] AT 2245 ON [id number] BY THOMPSON S. CRITICAL RESULT CALLED TO, READ BACK BY AND VERIFIED WITH: M LILLISTON,PHARMD@0450  12/30/20 [place] Performed at [facility], [place] [address], [place], [place] [address]    Culture GRAM POSITIVE COCCI  Final   Report Status PENDING  Incomplete  Culture, blood (routine x 2)     Status: None (Preliminary result)   Collection Time: 12/29/20 10:38 AM   Specimen: Left Antecubital; Blood  Result Value Ref Range Status   Specimen Description LEFT ANTECUBITAL BOTTLES DRAWN AEROBIC ONLY  Final   Special Requests Blood Culture adequate volume  Final  Culture  Setup Time   Final    GRAM POSITIVE COCCI AEROBIC BOTTLE ONLY Performed at [facility], [address], [place], [place] [address]    Culture PENDING  Incomplete   Report Status PENDING  Incomplete  Blood Culture ID Panel (Reflexed)     Status: Abnormal   Collection Time: 12/29/20 10:38 AM  Result Value Ref Range Status   Enterococcus faecalis NOT DETECTED NOT DETECTED Final   Enterococcus Faecium NOT DETECTED NOT DETECTED Final   Listeria monocytogenes NOT DETECTED NOT DETECTED Final   Staphylococcus species DETECTED (A) NOT DETECTED Final    Comment: CRITICAL RESULT CALLED TO, READ BACK BY AND VERIFIED WITH: M LILLISTON,PHARMD@0450  12/30/20 [place]    Staphylococcus aureus (BCID) DETECTED (A) NOT DETECTED Final    Comment: CRITICAL RESULT CALLED TO, READ BACK BY AND VERIFIED WITH: M LILLISTON,PHARMD@0450  12/30/20 [place]     Staphylococcus epidermidis DETECTED (A) NOT DETECTED Final    Comment: CRITICAL RESULT CALLED TO, READ BACK BY AND VERIFIED WITH: M LILLISTON,PHARMD@0453  12/30/20 [place]    Staphylococcus lugdunensis NOT DETECTED NOT DETECTED Final   Streptococcus species NOT DETECTED NOT DETECTED Final   Streptococcus agalactiae NOT DETECTED NOT DETECTED Final   Streptococcus pneumoniae NOT DETECTED NOT DETECTED Final   Streptococcus pyogenes NOT DETECTED NOT DETECTED Final   A.calcoaceticus-baumannii NOT DETECTED NOT DETECTED Final   Bacteroides fragilis NOT DETECTED NOT DETECTED Final   Enterobacterales NOT DETECTED NOT DETECTED Final   Enterobacter cloacae complex NOT DETECTED NOT DETECTED Final   Escherichia coli NOT DETECTED NOT DETECTED Final   Klebsiella aerogenes NOT DETECTED NOT DETECTED Final   Klebsiella oxytoca NOT DETECTED NOT DETECTED Final   Klebsiella pneumoniae NOT DETECTED NOT DETECTED Final   Proteus species NOT DETECTED NOT DETECTED Final   Salmonella species NOT DETECTED NOT DETECTED Final   Serratia marcescens NOT DETECTED NOT DETECTED Final   Haemophilus influenzae NOT DETECTED NOT DETECTED Final   Neisseria meningitidis NOT DETECTED NOT DETECTED Final   Pseudomonas aeruginosa NOT DETECTED NOT DETECTED Final   Stenotrophomonas maltophilia NOT DETECTED NOT DETECTED Final   Candida albicans NOT DETECTED NOT DETECTED Final   Candida auris NOT DETECTED NOT DETECTED Final   Candida glabrata NOT DETECTED NOT DETECTED Final   Candida krusei NOT DETECTED NOT DETECTED Final   Candida parapsilosis NOT DETECTED NOT DETECTED Final   Candida tropicalis NOT DETECTED NOT DETECTED Final   Cryptococcus neoformans/gattii NOT DETECTED NOT DETECTED Final   Methicillin resistance mecA/C DETECTED (A) NOT DETECTED Final    Comment: CRITICAL RESULT CALLED TO, READ BACK BY AND VERIFIED WITH: M LILLISTON,PHARMD@0453  12/30/20 [place]    Meth resistant mecA/C and MREJ NOT DETECTED NOT DETECTED Final     Comment: Performed at [facility], [address] [place], [place] [address]  MRSA Next Gen by PCR, Nasal     Status: None   Collection Time: 12/29/20 11:04 PM   Specimen: Nasal Mucosa; Nasal Swab  Result Value Ref Range Status   MRSA by PCR Next Gen NOT DETECTED NOT DETECTED Final    Comment: (NOTE) The GeneXpert MRSA Assay (FDA approved for NASAL specimens only), is one component of a comprehensive MRSA colonization surveillance program. It is not intended to diagnose MRSA infection nor to guide or monitor treatment for MRSA infections. Test performance is not FDA approved in patients less than 80 years old. Performed at [facility], [place] [address], [place], [place] [address]       Studies:  ECHOCARDIOGRAM COMPLETE  Result Date: 12/30/2020    ECHOCARDIOGRAM REPORT   Patient Name:   ROCCO KERKHOFF Date of Exam: 12/30/2020 Medical Rec #:  [id number]      Height:       64.0 in Accession #:    [id number]     Weight:       159.6 lb Date of Birth:  31-Oct-1950      BSA:          1.778 m Patient Age:    21 years       BP:           106/57 mmHg Patient Gender: M              HR:           71 bpm. Exam Location:  Inpatient Procedure: 2D Echo, Cardiac Doppler and Color Doppler Indications:    Bacteremia  History:        Patient has prior history of Echocardiogram examinations, most                 recent 10/22/2019. Arrythmias:Atrial Fibrillation,                 Signs/Symptoms:Hypotension; Risk Factors:Hyperlipidemia.  Sonographer:    Glo Herring Referring Phys: [id number] [place]  1. Left ventricular ejection fraction, by estimation, is 60 to 65%. The left ventricle has normal function. The left ventricle has no regional wall motion abnormalities. Left ventricular diastolic parameters were normal.  2. Right ventricular systolic function is normal. The right ventricular size is normal.  3. A small pericardial effusion is present. The pericardial effusion is  posterior to the left ventricle. There is no evidence of cardiac tamponade.  4. The mitral valve is normal in structure. Mild mitral valve regurgitation. No evidence of mitral stenosis.  5. The aortic valve is normal in structure. Aortic valve regurgitation is not visualized. No aortic stenosis is present.  6. The inferior vena cava is normal in size with greater than 50% respiratory variability, suggesting right atrial pressure of 3 mmHg. Comparison(s): Prior images reviewed side by side. Small pericardial effusion seen on prior 2021. Conclusion(s)/Recommendation(s): No evidence of valvular vegetations on this transthoracic echocardiogram. Consider a transesophageal echocardiogram to exclude infective endocarditis if clinically indicated. FINDINGS  Left Ventricle: Left ventricular ejection fraction, by estimation, is 60 to 65%. The left ventricle has normal function. The left ventricle has no regional wall motion abnormalities. The left ventricular internal cavity size was normal in size. There is  no left ventricular hypertrophy. Left ventricular diastolic parameters were normal. Right Ventricle: The right ventricular size is normal. No increase in right ventricular wall thickness. Right ventricular systolic function is normal. Left Atrium: Left atrial size was normal in size. Right Atrium: Right atrial size was normal in size. Pericardium: A small pericardial effusion is present. The pericardial effusion is posterior to the left ventricle. There is no evidence of cardiac tamponade. Mitral Valve: The mitral valve is normal in structure. Mild mitral valve regurgitation. No evidence of mitral valve stenosis. Tricuspid Valve: The tricuspid valve is normal in structure. Tricuspid valve regurgitation is mild . No evidence of tricuspid stenosis. Aortic Valve: The aortic valve is normal in structure. Aortic valve regurgitation is not visualized. No aortic stenosis is present. Aortic valve mean gradient measures 2.0 mmHg.  Aortic valve peak gradient measures 3.8 mmHg. Aortic valve area, by VTI measures 1.95 cm. Pulmonic Valve: The pulmonic valve was normal in structure. Pulmonic  valve regurgitation is trivial. No evidence of pulmonic stenosis. Aorta: The aortic root is normal in size and structure. Venous: The inferior vena cava is normal in size with greater than 50% respiratory variability, suggesting right atrial pressure of 3 mmHg. IAS/Shunts: No atrial level shunt detected by color flow Doppler.  LEFT VENTRICLE PLAX 2D LVIDd:         4.20 cm   Diastology LVIDs:         3.00 cm   LV e' medial:    6.53 cm/s LV PW:         1.00 cm   LV E/e' medial:  16.2 LV IVS:        1.00 cm   LV e' lateral:   8.10 cm/s LVOT diam:     2.00 cm   LV E/e' lateral: 13.1 LV SV:         42 LV SV Index:   24 LVOT Area:     3.14 cm  RIGHT VENTRICLE            IVC RV Basal diam:  3.60 cm    IVC diam: 1.90 cm RV S prime:     9.53 cm/s LEFT ATRIUM             Index        RIGHT ATRIUM           Index LA diam:        3.80 cm 2.14 cm/m   RA Area:     17.10 cm LA Vol (A2C):   55.2 ml 31.05 ml/m  RA Volume:   41.40 ml  23.29 ml/m LA Vol (A4C):   60.2 ml 33.87 ml/m LA Biplane Vol: 60.8 ml 34.20 ml/m  AORTIC VALVE                    PULMONIC VALVE AV Area (Vmax):    2.12 cm     PV Vmax:       0.63 m/s AV Area (Vmean):   2.13 cm     PV Peak grad:  1.6 mmHg AV Area (VTI):     1.95 cm AV Vmax:           97.90 cm/s AV Vmean:          67.400 cm/s AV VTI:            0.217 m AV Peak Grad:      3.8 mmHg AV Mean Grad:      2.0 mmHg LVOT Vmax:         66.10 cm/s LVOT Vmean:        45.700 cm/s LVOT VTI:          0.135 m LVOT/AV VTI ratio: 0.62  AORTA Ao Root diam: 3.30 cm Ao Asc diam:  2.60 cm MITRAL VALVE MV Area (PHT): 4.57 cm     SHUNTS MV Decel Time: 166 msec     Systemic VTI:  0.14 m MV E velocity: 106.00 cm/s  Systemic Diam: 2.00 cm MV A velocity: 42.40 cm/s MV E/A ratio:  2.50 Candee Furbish MD Electronically signed by Candee Furbish MD Signature Date/Time:  12/30/2020/1:02:27 PM    Final     Scheduled Meds:  apixaban  5 mg Oral BID   Chlorhexidine Gluconate Cloth  6 each Topical Daily   ipratropium-albuterol  3 mL Nebulization BID   levothyroxine  100 mcg Oral QAC breakfast   methylPREDNISolone (SOLU-MEDROL) injection  40 mg Intravenous Q12H  metoprolol tartrate  25 mg Oral BID   sodium chloride flush  3 mL Intravenous Q12H    Continuous Infusions:  sodium chloride 10 mL/hr at 12/30/20 1400   azithromycin Stopped (12/30/20 1353)   cefTRIAXone (ROCEPHIN)  IV Stopped (12/30/20 0903)   [START ON 12/31/2020] vancomycin       LOS: 1 day     Alma Friendly, MD [facility]  If 7PM-7AM, please contact night-coverage [email or web address] 12/30/2020, 4:34 PM

## 2020-12-30 NOTE — Progress Notes (Signed)
PHARMACY - PHYSICIAN COMMUNICATION CRITICAL VALUE ALERT - BLOOD CULTURE IDENTIFICATION (BCID)  Alan [name] is an 70 y.o. male who presented to [facility] on 12/29/2020 with a chief complaint of shortness of breath.    Assessment:  Currently on Rocephin + Zithromax for CAP.  PMH significant for Lung CA undergoing chemotherapy.  Blood cx + GPCC; BCID + MRSA and MRSE in both anaerobic & aerobic bottles.  MRSA PCR was negative and patient's O2 demands have decreased since admission.    Name of physician (or Provider) Contacted: X Blount  Current antibiotics: Rocephin + Zithromax  Changes to prescribed antibiotics recommended:  Empicially add Vancomycin 1500mg  IV x1 then 1gm IV q24h to target AUC 400-550 F/U ID recommendations  Results for orders placed or performed during the hospital encounter of 12/29/20  Blood Culture ID Panel (Reflexed) (Collected: 12/29/2020 10:38 AM)  Result Value Ref Range   Enterococcus faecalis NOT DETECTED NOT DETECTED   Enterococcus Faecium NOT DETECTED NOT DETECTED   Listeria monocytogenes NOT DETECTED NOT DETECTED   Staphylococcus species DETECTED (A) NOT DETECTED   Staphylococcus aureus (BCID) DETECTED (A) NOT DETECTED   Staphylococcus epidermidis DETECTED (A) NOT DETECTED   Staphylococcus lugdunensis NOT DETECTED NOT DETECTED   Streptococcus species NOT DETECTED NOT DETECTED   Streptococcus agalactiae NOT DETECTED NOT DETECTED   Streptococcus pneumoniae NOT DETECTED NOT DETECTED   Streptococcus pyogenes NOT DETECTED NOT DETECTED   A.calcoaceticus-baumannii NOT DETECTED NOT DETECTED   Bacteroides fragilis NOT DETECTED NOT DETECTED   Enterobacterales NOT DETECTED NOT DETECTED   Enterobacter cloacae complex NOT DETECTED NOT DETECTED   Escherichia coli NOT DETECTED NOT DETECTED   Klebsiella aerogenes NOT DETECTED NOT DETECTED   Klebsiella oxytoca NOT DETECTED NOT DETECTED   Klebsiella pneumoniae NOT DETECTED NOT DETECTED   Proteus species NOT DETECTED  NOT DETECTED   Salmonella species NOT DETECTED NOT DETECTED   Serratia marcescens NOT DETECTED NOT DETECTED   Haemophilus influenzae NOT DETECTED NOT DETECTED   Neisseria meningitidis NOT DETECTED NOT DETECTED   Pseudomonas aeruginosa NOT DETECTED NOT DETECTED   Stenotrophomonas maltophilia NOT DETECTED NOT DETECTED   Candida albicans NOT DETECTED NOT DETECTED   Candida auris NOT DETECTED NOT DETECTED   Candida glabrata NOT DETECTED NOT DETECTED   Candida krusei NOT DETECTED NOT DETECTED   Candida parapsilosis NOT DETECTED NOT DETECTED   Candida tropicalis NOT DETECTED NOT DETECTED   Cryptococcus neoformans/gattii NOT DETECTED NOT DETECTED   Methicillin resistance mecA/C DETECTED (A) NOT DETECTED   Meth resistant mecA/C and MREJ NOT DETECTED NOT DETECTED    Netta Cedars PharmD 12/30/2020  5:00 AM

## 2020-12-30 NOTE — Telephone Encounter (Signed)
Please arrange for hospital follow-up in [place] clinic next available -New hypoxia -Metastatic lung cancer -COPD

## 2020-12-30 NOTE — Consult Note (Signed)
[facility] for Infectious Disease    Date of Admission:  12/29/2020     Reason for Consult: autochamp staph aureus bacteremia    Referring Provider: n/a     Lines:  10/2019 right chest port  Abx: 11/13-c vanc 11/12-c ceftriaxone 11/12-c azith        Assessment: 70 y.o. male former smoker, left lung [place] cancer s/p chemoradiation transitioned to second line chemotherapy in setting of disease progression (last cycle docetaxel and ramucirumab 12/19/2020), admitted in transfer from [place] on 11/12 for acute hypoxic respiratory failure with afib/rvr and septic shock found to have staph aureus bacteremia   #staph aureus bacteremia -Sx of at least a week; nonspecific but in a chemotherapy patient -11/12 bcx 1 of 2 set staph epi -- per bcid likely mrse; also staph aureus. Bcid result at this time only -- would keep on vanc and wait for culture -Patient does have a right chest port that was placed 10/2019. This will need to be removed -Cxr suggestive new left lung opacity ?cancer vs pna/pneumonitis vs septic emboli. Echocardiogram is needed   #hypoxic respiratory failure -sepsis vs staph aureus involvement vs diastolic dysfunction vs concurrent bacterial pna vs metastatic lung disease  -multifactorial a consideration -would get sputum cx; urine strep/legionella ag -would continue CAP coverage for now along side staph aureus bsi treatment -- given his significant improvement, can change to oral cefdinir/azith for total 5 days   #afib/rvr -s/p dccm this admission   #metastatic lung cancer squamous cell -last chemo cycle 11/02 -repeat brain mri 10/2020 no new lesion; improved edematous change -last chest ct 11/2020 showed increased size left lung cancerous lesion  Plan: Repeat blood culture Please remove chemo port Tte; likely will also need tee Strep/legionella ag; sputum cx Continue vanc iv Change ceftriaxone/azith to oral cefdinir/azith to finish on  11/16 Consider palliative care discussion      ------------------------------------------------ Principal Problem:   Acute respiratory failure with hypoxia ([place]) Active Problems:   Paroxysmal atrial fibrillation with RVR ([place])   Lung cancer ([place])   AKI (acute kidney injury) ([place])   Anemia associated with chemotherapy   Hypotension    HPI: Alan [name] is a 70 y.o. male former smoker, left lung [place] cancer s/p chemoradiation transitioned to second line chemotherapy in setting of disease progression (last cycle docetaxel and ramucirumab 12/19/2020), admitted in transfer from [place] on 11/12 for acute hypoxic respiratory failure with afib/rvr and septic shock found to have staph aureus bacteremia   Reviewed chart No subjective f/c; baseline cough unchanged 1 week dyspnea on exertion  Hx squamous cell cancer -stage IIIb 10/2019 at dx; large left suprahilar mass -s/p adjuvant chemoradiation -> consolidation chemotherapy (durvalumab 01/2020-08/2020) -disease progress with brain mets s/p craniotomy; completed keppra/dexamethasone; chemo changed to docetaxel & ramucirumab (09/2020; cycle 2 was on 11/2)   Patient is without fever He is feelign much better Denies focal joint pain  No hardware/catheter outside of the port   Family History  Problem Relation Age of Onset   Hypertension Father    Dementia Father        died age 102   Heart disease Father    Heart failure Other     Social History   Tobacco Use   Smoking status: Former    Years: 45.00    Types: Cigars, Cigarettes    Quit date: 10/19/2014    Years since quitting: 6.2   Smokeless tobacco: Never  Vaping  Use   Vaping Use: Never used  Substance Use Topics   Alcohol use: Not Currently   Drug use: No    No Known Allergies  Review of Systems: ROS All Other ROS was negative, except mentioned above   Past Medical History:  Diagnosis Date   Anxiety    Arthritis    Dysrhythmia    Hyperlipemia     Hypertension    Hyperthyroidism    Lung cancer (HCC)    Myocardial infarction ([place])    Paroxysmal atrial fibrillation ([place])    Port-A-Cath in place 11/14/2019   Tachycardia        Scheduled Meds:  apixaban  5 mg Oral BID   Chlorhexidine Gluconate Cloth  6 each Topical Daily   levothyroxine  100 mcg Oral QAC breakfast   methylPREDNISolone (SOLU-MEDROL) injection  40 mg Intravenous Q12H   metoprolol tartrate  25 mg Oral BID   sodium chloride flush  3 mL Intravenous Q12H   Continuous Infusions:  sodium chloride 10 mL/hr at 12/30/20 1000   azithromycin Stopped (12/29/20 1444)   cefTRIAXone (ROCEPHIN)  IV Stopped (12/30/20 0903)   [START ON 12/31/2020] vancomycin     PRN Meds:.sodium chloride, acetaminophen **OR** acetaminophen, albuterol, ALPRAZolam, dextromethorphan, ondansetron **OR** ondansetron (ZOFRAN) IV   OBJECTIVE: Blood pressure (!) 106/57, pulse 83, temperature 98 F (36.7 C), temperature source Oral, resp. rate (!) 24, height 5\' 4"  (1.626 m), weight 72.4 kg, SpO2 (!) 67 %.  Physical Exam  General/constitutional: no distress, pleasant; not particularly verbal  HEENT: Normocephalic, PER, Conj Clear, EOMI, Oropharynx clear Neck supple CV: rrr no mrg Lungs: clear to auscultation, normal respiratory effort Abd: Soft, Nontender Ext: no edema Skin: No Rash Neuro: nonfocal MSK: no peripheral joint swelling/tenderness/warmth; back spines nontender   Central line presence: right chest port no erythema/tenderness   Lab Results Lab Results  Component Value Date   WBC 7.2 12/30/2020   HGB 7.3 (L) 12/30/2020   HCT 23.2 (L) 12/30/2020   MCV 95.9 12/30/2020   PLT 198 12/30/2020    Lab Results  Component Value Date   CREATININE 0.98 12/30/2020   BUN 27 (H) 12/30/2020   NA 135 12/30/2020   K 4.3 12/30/2020   CL 106 12/30/2020   CO2 20 (L) 12/30/2020    Lab Results  Component Value Date   ALT 21 12/29/2020   AST 24 12/29/2020   ALKPHOS 53 12/29/2020    BILITOT 1.1 12/29/2020      Microbiology: Recent Results (from the past 240 hour(s))  Resp Panel by RT-PCR (Flu A&B, Covid) Nasopharyngeal Swab     Status: None   Collection Time: 12/29/20 10:12 AM   Specimen: Nasopharyngeal Swab; Nasopharyngeal(NP) swabs in vial transport medium  Result Value Ref Range Status   SARS Coronavirus 2 by RT PCR NEGATIVE NEGATIVE Final    Comment: (NOTE) SARS-CoV-2 target nucleic acids are NOT DETECTED.  The SARS-CoV-2 RNA is generally detectable in upper respiratory specimens during the acute phase of infection. The lowest concentration of SARS-CoV-2 viral copies this assay can detect is 138 copies/mL. A negative result does not preclude SARS-Cov-2 infection and should not be used as the sole basis for treatment or other patient management decisions. A negative result may occur with  improper specimen collection/handling, submission of specimen other than nasopharyngeal swab, presence of viral mutation(s) within the areas targeted by this assay, and inadequate number of viral copies(<138 copies/mL). A negative result must be combined with clinical observations, patient history, and epidemiological information.  The expected result is Negative.  Fact Sheet for Patients:  EntrepreneurPulse.com.au  Fact Sheet for Healthcare Providers:  IncredibleEmployment.be  This test is no t yet approved or cleared by the [place] FDA and  has been authorized for detection and/or diagnosis of SARS-CoV-2 by FDA under an Emergency Use Authorization (EUA). This EUA will remain  in effect (meaning this test can be used) for the duration of the COVID-19 declaration under Section 564(b)(1) of the Act, 21 U.S.C.section 360bbb-3(b)(1), unless the authorization is terminated  or revoked sooner.       Influenza A by PCR NEGATIVE NEGATIVE Final   Influenza B by PCR NEGATIVE NEGATIVE Final    Comment: (NOTE) The Xpert Xpress  SARS-CoV-2/FLU/RSV plus assay is intended as an aid in the diagnosis of influenza from Nasopharyngeal swab specimens and should not be used as a sole basis for treatment. Nasal washings and aspirates are unacceptable for Xpert Xpress SARS-CoV-2/FLU/RSV testing.  Fact Sheet for Patients: EntrepreneurPulse.com.au  Fact Sheet for Healthcare Providers: IncredibleEmployment.be  This test is not yet approved or cleared by the [place] FDA and has been authorized for detection and/or diagnosis of SARS-CoV-2 by FDA under an Emergency Use Authorization (EUA). This EUA will remain in effect (meaning this test can be used) for the duration of the COVID-19 declaration under Section 564(b)(1) of the Act, 21 U.S.C. section 360bbb-3(b)(1), unless the authorization is terminated or revoked.  Performed at [facility], [address], [place], [place] [address]   Culture, blood (routine x 2)     Status: None (Preliminary result)   Collection Time: 12/29/20 10:38 AM   Specimen: BLOOD RIGHT HAND  Result Value Ref Range Status   Specimen Description   Final    BLOOD RIGHT HAND BOTTLES DRAWN AEROBIC AND ANAEROBIC Performed at [facility], [address], [place], [place] [address]    Special Requests   Final    Blood Culture adequate volume Performed at [address] [place], [place] [address]    Culture  Setup Time   Final    GRAM POSITIVE COCCI IN BOTH AEROBIC AND ANAEROBIC BOTTLES Gram Stain Report Called to,Read Back By and Verified With: SIMPSON K. AT [facility] AT 2245 ON [id number] BY THOMPSON S. CRITICAL RESULT CALLED TO, READ BACK BY AND VERIFIED WITH: M LILLISTON,PHARMD@0450  12/30/20 [place] Performed at [facility], [address], [place], [place] [address]    Culture GRAM POSITIVE COCCI  Final   Report Status PENDING  Incomplete  Culture, blood (routine x 2)     Status: None (Preliminary result)   Collection Time: 12/29/20 10:38 AM    Specimen: Left Antecubital; Blood  Result Value Ref Range Status   Specimen Description LEFT ANTECUBITAL BOTTLES DRAWN AEROBIC ONLY  Final   Special Requests Blood Culture adequate volume  Final   Culture  Setup Time   Final    GRAM POSITIVE COCCI AEROBIC BOTTLE ONLY Performed at [facility], [address] [place], [place] [address]    Culture PENDING  Incomplete   Report Status PENDING  Incomplete  Blood Culture ID Panel (Reflexed)     Status: Abnormal   Collection Time: 12/29/20 10:38 AM  Result Value Ref Range Status   Enterococcus faecalis NOT DETECTED NOT DETECTED Final   Enterococcus Faecium NOT DETECTED NOT DETECTED Final   Listeria monocytogenes NOT DETECTED NOT DETECTED Final   Staphylococcus species DETECTED (A) NOT DETECTED Final    Comment: CRITICAL RESULT CALLED TO, READ BACK  BY AND VERIFIED WITH: M LILLISTON,PHARMD@0450  12/30/20 [place]    Staphylococcus aureus (BCID) DETECTED (A) NOT DETECTED Final    Comment: CRITICAL RESULT CALLED TO, READ BACK BY AND VERIFIED WITH: M LILLISTON,PHARMD@0450  12/30/20 [place]    Staphylococcus epidermidis DETECTED (A) NOT DETECTED Final    Comment: CRITICAL RESULT CALLED TO, READ BACK BY AND VERIFIED WITH: M LILLISTON,PHARMD@0453  12/30/20 [place]    Staphylococcus lugdunensis NOT DETECTED NOT DETECTED Final   Streptococcus species NOT DETECTED NOT DETECTED Final   Streptococcus agalactiae NOT DETECTED NOT DETECTED Final   Streptococcus pneumoniae NOT DETECTED NOT DETECTED Final   Streptococcus pyogenes NOT DETECTED NOT DETECTED Final   A.calcoaceticus-baumannii NOT DETECTED NOT DETECTED Final   Bacteroides fragilis NOT DETECTED NOT DETECTED Final   Enterobacterales NOT DETECTED NOT DETECTED Final   Enterobacter cloacae complex NOT DETECTED NOT DETECTED Final   Escherichia coli NOT DETECTED NOT DETECTED Final   Klebsiella aerogenes NOT DETECTED NOT DETECTED Final   Klebsiella oxytoca NOT DETECTED NOT DETECTED Final   Klebsiella pneumoniae NOT  DETECTED NOT DETECTED Final   Proteus species NOT DETECTED NOT DETECTED Final   Salmonella species NOT DETECTED NOT DETECTED Final   Serratia marcescens NOT DETECTED NOT DETECTED Final   Haemophilus influenzae NOT DETECTED NOT DETECTED Final   Neisseria meningitidis NOT DETECTED NOT DETECTED Final   Pseudomonas aeruginosa NOT DETECTED NOT DETECTED Final   Stenotrophomonas maltophilia NOT DETECTED NOT DETECTED Final   Candida albicans NOT DETECTED NOT DETECTED Final   Candida auris NOT DETECTED NOT DETECTED Final   Candida glabrata NOT DETECTED NOT DETECTED Final   Candida krusei NOT DETECTED NOT DETECTED Final   Candida parapsilosis NOT DETECTED NOT DETECTED Final   Candida tropicalis NOT DETECTED NOT DETECTED Final   Cryptococcus neoformans/gattii NOT DETECTED NOT DETECTED Final   Methicillin resistance mecA/C DETECTED (A) NOT DETECTED Final    Comment: CRITICAL RESULT CALLED TO, READ BACK BY AND VERIFIED WITH: M LILLISTON,PHARMD@0453  12/30/20 [place]    Meth resistant mecA/C and MREJ NOT DETECTED NOT DETECTED Final    Comment: Performed at [facility], [address], [place], [place] [address]  MRSA Next Gen by PCR, Nasal     Status: None   Collection Time: 12/29/20 11:04 PM   Specimen: Nasal Mucosa; Nasal Swab  Result Value Ref Range Status   MRSA by PCR Next Gen NOT DETECTED NOT DETECTED Final    Comment: (NOTE) The GeneXpert MRSA Assay (FDA approved for NASAL specimens only), is one component of a comprehensive MRSA colonization surveillance program. It is not intended to diagnose MRSA infection nor to guide or monitor treatment for MRSA infections. Test performance is not FDA approved in patients less than 32 years old. Performed at [facility], [place] [address] [place], [place] [address]      Serology:    Imaging: If present, new imagings (plain films, ct scans, and mri) have been personally visualized and interpreted; radiology reports have  been reviewed. Decision making incorporated into the Impression / Recommendations.  11/12 cxr There is homogeneous opacity in the left apex. There are patchy infiltrates in the left mid and left lower lung fields and prominence of left hilum. Findings suggest pneumonia with possible underlying neoplastic process. Small left pleural effusion is seen.  10/27 chest ct with contrast 1. Previously FDG avid soft tissue nodule in the AP window is slightly enlarged, measuring 3.2 x 2.3 cm, previously 2.4 x 1.5 cm when measured similarly. 2. Additional previously FDG  avid left hilar nodule is slightly enlarged, measuring 1.9 x 1.8 cm, previously 1.6 x 1.4 cm. 3. Findings are consistent with worsened metastatic disease. 4. Unchanged 0.8 cm subpleural nodule of the left lower lobe, previously hypermetabolic and consistent with a small metastasis. 5. Interval resolution of previously seen left-sided pneumothorax component, with small, residual loculated left pleural effusion components. 6. Otherwise unchanged post treatment/post radiation appearance of the left lung, with extensive perihilar and paramedian left upper lobe fibrosis and consolidation. 7. There is new, irregular, scattered ground-glass opacity throughout the right lung, nonspecific and infectious or inflammatory. Differential considerations include drug toxicity in the setting of chemotherapy. 8. Coronary artery disease.  09/09 brain mri wwo contrast 1. Postoperative changes associated with right frontal craniotomy and resection of a right frontal lobe enhancing lesion. Small to moderate pneumocephalus and small volume extra-axial fluid/hemorrhage along the right frontal convexity with overall decreased right frontal lobe edema and slightly increased mild (3-4 mm) leftward midline shift. No nodular/masslike enhancement to suggest residual tumor. 2. Trace intraventricular hemorrhage layering in the occipital  horns bilaterally. 3. Moderate scattered paranasal sinus mucosal thickening with air-fluid levels and frothy secretions in bilateral maxillary sinuses.  Jabier Mutton, [place] [facility] for Infectious [place] [phone] pager    12/30/2020, 10:41 AM

## 2020-12-31 ENCOUNTER — Encounter (HOSPITAL_COMMUNITY): Payer: Self-pay | Admitting: Internal Medicine

## 2020-12-31 DIAGNOSIS — B9561 Methicillin susceptible Staphylococcus aureus infection as the cause of diseases classified elsewhere: Secondary | ICD-10-CM | POA: Diagnosis not present

## 2020-12-31 DIAGNOSIS — R7881 Bacteremia: Secondary | ICD-10-CM | POA: Diagnosis not present

## 2020-12-31 LAB — GLUCOSE, CAPILLARY
Glucose-Capillary: 125 mg/dL — ABNORMAL HIGH (ref 70–99)
Glucose-Capillary: 126 mg/dL — ABNORMAL HIGH (ref 70–99)
Glucose-Capillary: 166 mg/dL — ABNORMAL HIGH (ref 70–99)
Glucose-Capillary: 170 mg/dL — ABNORMAL HIGH (ref 70–99)
Glucose-Capillary: 197 mg/dL — ABNORMAL HIGH (ref 70–99)

## 2020-12-31 LAB — CBC WITH DIFFERENTIAL/PLATELET
Abs Immature Granulocytes: 0.91 10*3/uL — ABNORMAL HIGH (ref 0.00–0.07)
Basophils Absolute: 0 10*3/uL (ref 0.0–0.1)
Basophils Relative: 0 %
Eosinophils Absolute: 0 10*3/uL (ref 0.0–0.5)
Eosinophils Relative: 0 %
HCT: 22.7 % — ABNORMAL LOW (ref 39.0–52.0)
Hemoglobin: 7.2 g/dL — ABNORMAL LOW (ref 13.0–17.0)
Immature Granulocytes: 9 %
Lymphocytes Relative: 3 %
Lymphs Abs: 0.4 10*3/uL — ABNORMAL LOW (ref 0.7–4.0)
MCH: 30.8 pg (ref 26.0–34.0)
MCHC: 31.7 g/dL (ref 30.0–36.0)
MCV: 97 fL (ref 80.0–100.0)
Monocytes Absolute: 0.7 10*3/uL (ref 0.1–1.0)
Monocytes Relative: 7 %
Neutro Abs: 8.2 10*3/uL — ABNORMAL HIGH (ref 1.7–7.7)
Neutrophils Relative %: 81 %
Platelets: 192 10*3/uL (ref 150–400)
RBC: 2.34 MIL/uL — ABNORMAL LOW (ref 4.22–5.81)
RDW: 16.3 % — ABNORMAL HIGH (ref 11.5–15.5)
WBC: 10.2 10*3/uL (ref 4.0–10.5)
nRBC: 3.1 % — ABNORMAL HIGH (ref 0.0–0.2)

## 2020-12-31 LAB — TSH: TSH: 0.481 u[IU]/mL (ref 0.350–4.500)

## 2020-12-31 LAB — TYPE AND SCREEN
ABO/RH(D): O NEG
Antibody Screen: NEGATIVE

## 2020-12-31 LAB — BASIC METABOLIC PANEL
Anion gap: 6 (ref 5–15)
BUN: 24 mg/dL — ABNORMAL HIGH (ref 8–23)
CO2: 23 mmol/L (ref 22–32)
Calcium: 7.9 mg/dL — ABNORMAL LOW (ref 8.9–10.3)
Chloride: 105 mmol/L (ref 98–111)
Creatinine, Ser: 0.94 mg/dL (ref 0.61–1.24)
GFR, Estimated: >60 mL/min (ref >60)
Glucose, Bld: 131 mg/dL — ABNORMAL HIGH (ref 70–99)
Potassium: 4 mmol/L (ref 3.5–5.1)
Sodium: 134 mmol/L — ABNORMAL LOW (ref 135–145)

## 2020-12-31 LAB — FERRITIN: Ferritin: 1230 ng/mL — ABNORMAL HIGH (ref 24–336)

## 2020-12-31 LAB — EXPECTORATED SPUTUM ASSESSMENT W GRAM STAIN, RFLX TO RESP C

## 2020-12-31 LAB — IRON AND TIBC
Iron: 69 ug/dL (ref 45–182)
Saturation Ratios: 46 % — ABNORMAL HIGH (ref 17.9–39.5)
TIBC: 150 ug/dL — ABNORMAL LOW (ref 250–450)
UIBC: 81 ug/dL

## 2020-12-31 LAB — VITAMIN B12: Vitamin B-12: 613 pg/mL (ref 180–914)

## 2020-12-31 LAB — STREP PNEUMONIAE URINARY ANTIGEN: Strep Pneumo Urinary Antigen: NEGATIVE

## 2020-12-31 LAB — T4, FREE: Free T4: 1.28 ng/dL — ABNORMAL HIGH (ref 0.61–1.12)

## 2020-12-31 LAB — LACTIC ACID, PLASMA: Lactic Acid, Venous: 1.5 mmol/L (ref 0.5–1.9)

## 2020-12-31 LAB — FOLATE: Folate: 6.7 ng/mL (ref >5.9)

## 2020-12-31 MED ORDER — HEPARIN (PORCINE) 25000 UT/250ML-% IV SOLN
1000.0000 [IU]/h | INTRAVENOUS | Status: DC
  Administered 2020-12-31: 1000 [IU]/h via INTRAVENOUS
  Filled 2020-12-31: qty 250

## 2020-12-31 MED ORDER — VANCOMYCIN HCL 1250 MG/250ML IV SOLN
1250.0000 mg | INTRAVENOUS | Status: AC
  Administered 2021-01-01 – 2021-01-05 (×5): 1250 mg via INTRAVENOUS
  Filled 2020-12-31 (×5): qty 250

## 2020-12-31 MED ORDER — CEFDINIR 300 MG PO CAPS
300.0000 mg | ORAL_CAPSULE | Freq: Two times a day (BID) | ORAL | Status: AC
  Administered 2021-01-01 – 2021-01-02 (×4): 300 mg via ORAL
  Filled 2020-12-31 (×4): qty 1

## 2020-12-31 MED ORDER — ALUM & MAG HYDROXIDE-SIMETH 200-200-20 MG/5ML PO SUSP
30.0000 mL | Freq: Four times a day (QID) | ORAL | Status: DC | PRN
  Administered 2020-12-31 (×2): 30 mL via ORAL
  Filled 2020-12-31 (×2): qty 30

## 2020-12-31 MED ORDER — AZITHROMYCIN 250 MG PO TABS
500.0000 mg | ORAL_TABLET | Freq: Every day | ORAL | Status: AC
  Administered 2020-12-31 – 2021-01-02 (×3): 500 mg via ORAL
  Filled 2020-12-31 (×2): qty 2

## 2020-12-31 MED ORDER — HEPARIN (PORCINE) 25000 UT/250ML-% IV SOLN
100.0000 [IU]/h | INTRAVENOUS | Status: DC
  Filled 2020-12-31: qty 250

## 2020-12-31 MED ORDER — PANTOPRAZOLE SODIUM 40 MG PO TBEC
40.0000 mg | DELAYED_RELEASE_TABLET | Freq: Every day | ORAL | Status: DC
  Administered 2020-12-31 – 2021-01-05 (×6): 40 mg via ORAL
  Filled 2020-12-31 (×6): qty 1

## 2020-12-31 NOTE — Evaluation (Signed)
Physical Therapy Evaluation Patient Details Name: Alan [name] MRN: [id number] DOB: 07/07/1950 Today's Date: 12/31/2020  History of Present Illness  Patient is a 70 year old male who presented to the hosptial on 12/29/20 with shortness of breath and chronic cough. patietn was found to have acute respiratory failure with hypoxia, sepsis, a fib with RVR, AKI and bacteremia. PMH: metastatic lung cancer on chemo, HTN, A fib, and anxiety.  Clinical Impression  Patient is mobilizing well. Ambulated x 180' on @ L [place]. 1 stand and rest break. HR up to 115, SPO2 remained >92% on 2 L. Continue PT for progressive mobility, monitor saturation. Pt admitted with above diagnosis.   Pt currently with functional limitations due to the deficits listed below (see PT Problem List). Pt will benefit from skilled PT to increase their independence and safety with mobility to allow discharge to the venue listed below.          Recommendations for follow up therapy are one component of a multi-disciplinary discharge planning process, led by the attending physician.  Recommendations may be updated based on patient status, additional functional criteria and insurance authorization.  Follow Up Recommendations No PT follow up    Assistance Recommended at Discharge None  Functional Status Assessment Patient has had a recent decline in their functional status and demonstrates the ability to make significant improvements in function in a reasonable and predictable amount of time.  Equipment Recommendations  None recommended by PT    Recommendations for Other Services       Precautions / Restrictions Precautions Precautions: Fall Precaution Comments: monitor O2 and HR Restrictions Weight Bearing Restrictions: No      Mobility  Bed Mobility Overal bed mobility: Needs Assistance Bed Mobility: Supine to Sit     Supine to sit: Min guard          Transfers Overall transfer level: Needs assistance Equipment  used: Rolling walker (2 wheels) Transfers: Sit to/from Stand Sit to Stand: Min guard                Ambulation/Gait Ambulation/Gait assistance: Min guard Gait Distance (Feet): 180 Feet Assistive device: Rolling walker (2 wheels) Gait Pattern/deviations: Step-through pattern       General Gait Details: gait steady HR up to 118  Stairs            Wheelchair Mobility    Modified Rankin (Stroke Patients Only)       Balance Overall balance assessment: Mild deficits observed, not formally tested                                           Pertinent Vitals/Pain Pain Assessment: No/denies pain    Home Living Family/patient expects to be discharged to:: Private residence Living Arrangements: Spouse/significant other Available Help at Discharge: Family;Available 24 hours/day Type of Home: House Home Access: Stairs to enter   [employer] of Steps: 3   Home Layout: One level Home Equipment: Conservation officer, nature (2 wheels)      Prior Function Prior Level of Function : Needs assist               ADLs Comments: patient reported being independent on good days and needing some help from wife on bad days.     Hand Dominance   Dominant Hand: Right    Extremity/Trunk Assessment   Upper Extremity Assessment Upper Extremity Assessment:  Overall [facility] for tasks assessed    Lower Extremity Assessment Lower Extremity Assessment: Overall WFL for tasks assessed    Cervical / Trunk Assessment Cervical / Trunk Assessment: Normal  Communication   Communication: No difficulties  Cognition Arousal/Alertness: Awake/alert Behavior During Therapy: WFL for tasks assessed/performed;Flat affect Overall Cognitive Status: Within Functional Limits for tasks assessed                                          General Comments      Exercises     Assessment/Plan    PT Assessment Patient needs continued PT services  PT Problem  List Decreased strength;Decreased mobility;Decreased activity tolerance;Cardiopulmonary status limiting activity       PT Treatment Interventions DME instruction;Therapeutic activities;Gait training;Therapeutic exercise;Patient/family education;Functional mobility training    PT Goals (Current goals can be found in the Care Plan section)  Acute Rehab PT Goals Patient Stated Goal: go home PT Goal Formulation: With patient/family Time For Goal Achievement: 01/14/21 Potential to Achieve Goals: Good    Frequency Min 3X/week   Barriers to discharge        Co-evaluation PT/OT/SLP Co-Evaluation/Treatment: Yes Reason for Co-Treatment: For patient/therapist safety PT goals addressed during session: Mobility/safety with mobility OT goals addressed during session: ADL's and self-care       AM-PAC PT "6 Clicks" Mobility  Outcome Measure Help needed turning from your back to your side while in a flat bed without using bedrails?: None Help needed moving from lying on your back to sitting on the side of a flat bed without using bedrails?: None Help needed moving to and from a bed to a chair (including a wheelchair)?: A Little Help needed standing up from a chair using your arms (e.g., wheelchair or bedside chair)?: A Little Help needed to walk in hospital room?: A Little Help needed climbing 3-5 steps with a railing? : A Little 6 Click Score: 20    End of Session   Activity Tolerance: Patient tolerated treatment well Patient left: in chair;with call bell/phone within reach Nurse Communication: Mobility status PT Visit Diagnosis: Muscle weakness (generalized) (M62.81);Difficulty in walking, not elsewhere classified (R26.2)    Time: 1212-1228 PT Time Calculation (min) (ACUTE ONLY): 16 min   Charges:   PT Evaluation $PT Eval Low Complexity: [place] PT Acute Rehabilitation Services Pager [phone] Office [phone]   Claretha Cooper 12/31/2020,  2:43 PM

## 2020-12-31 NOTE — Progress Notes (Signed)
Pharmacy Antibiotic Note  Alan [name] is a 70 y.o. male admitted on 12/29/2020 with Staph Aureus bacteremia. Pharmacy has been consulted for Vancomycin dosing.  SCr improved to 0.94, will adjust Vancomycin dose today.  Plan: - Adjust Vancomycin to 1250 mg IV every 24 hours (eAUC 442, SCr 0.94, Vd 0.72) - Continues on Rocephin + Azithro for CAP coverage - Will continue to follow renal function, culture results, LOT, and antibiotic de-escalation plans   Height: 5\' 4"  (162.6 cm) Weight: 72.4 kg (159 lb 9.8 oz) IBW/kg (Calculated) : 59.2  Temp (24hrs), Avg:98.1 F (36.7 C), Min:97.6 F (36.4 C), Max:98.4 F (36.9 C)  Recent Labs  Lab 12/29/20 1011 12/29/20 1207 12/30/20 0746 12/31/20 0530  WBC 5.5  --  7.2 10.2  CREATININE 1.44*  --  0.98 0.94  LATICACIDVEN 2.4* 2.3*  --  1.5    Estimated Creatinine Clearance: 65.8 mL/min (by C-G formula based on SCr of 0.94 mg/dL).    No Known Allergies  Antimicrobials this admission: Azithromycin 11/12 >> Rocephin 11/12 >> Vancomycin 11/13 >>  Microbiology results: 11/12 COVID/flu >> neg 11/12 MRSA PCR >> neg 11/12 BCx >> 3/4 GPC (BCID SA, SE, mec A+) 11/13 BCx >>  Thank you for allowing pharmacy to be a part of this patient's care.  Alycia Rossetti, PharmD, BCPS Clinical Pharmacist 12/31/2020 8:04 AM   **Pharmacist phone directory can now be found on [email or web address] ([email or web address]).  Listed under [place].

## 2020-12-31 NOTE — Consult Note (Addendum)
[facility] Surgery Consult Note  Alan [name] 22-Sep-1950  [id number].    Requesting MD: Horris Latino, MD Chief Complaint/Reason for Consult: port-a-cath removal  HPI:  Alan [name] is a 70 y/o M with a PMH [place] lung cancer s/p chemoradiation, on chemotherapy (last dose 12/19/20) who was transferred to [place] ICU from [facility] due to septic shock, new afib with RVR, found to have staph aureus bacteremia. He has a right chest wall port that was placed 11/01/2019 by Dr. Arnoldo Morale. General surgery has been consulted for port removal.  ROS: Review of Systems  Constitutional: Negative.   HENT: Negative.    Eyes: Negative.   Respiratory:  Positive for cough and shortness of breath.   Cardiovascular: Negative.   Gastrointestinal: Negative.   Genitourinary: Negative.   Musculoskeletal: Negative.   Skin: Negative.   Neurological: Negative.   Endo/Heme/Allergies: Negative.   Psychiatric/Behavioral: Negative.     Family History  Problem Relation Age of Onset   Hypertension Father    Dementia Father        died age 25   Heart disease Father    Heart failure Other     Past Medical History:  Diagnosis Date   Anxiety    Arthritis    Dysrhythmia    Hyperlipemia    Hypertension    Hyperthyroidism    Lung cancer ([place])    Myocardial infarction ([place])    Paroxysmal atrial fibrillation ([place])    Port-A-Cath in place 11/14/2019   Tachycardia     Past Surgical History:  Procedure Laterality Date   APPLICATION OF CRANIAL NAVIGATION N/A 10/26/2020   Procedure: APPLICATION OF CRANIAL NAVIGATION;  Surgeon: Consuella Lose, MD;  Location: [place];  Service: Neurosurgery;  Laterality: N/A;  RM 21   CARDIAC CATHETERIZATION     CRANIOTOMY Right 10/26/2020   Procedure: Stereotactic Right frontal craniotomy for resection of tumor with sonopet;  Surgeon: Consuella Lose, MD;  Location: [place];  Service: Neurosurgery;  Laterality: Right;   PORTACATH PLACEMENT Right 11/01/2019    Procedure: INSERTION PORT-A-CATH;  Surgeon: Aviva Signs, MD;  Location: [facility];  Service: General;  Laterality: Right;    Social History:  reports that he quit smoking about 6 years ago. His smoking use included cigars and cigarettes. He has never used smokeless tobacco. He reports that he does not currently use alcohol. He reports that he does not use drugs.  Allergies: No Known Allergies  Medications Prior to Admission  Medication Sig Dispense Refill   acetaminophen (TYLENOL) 500 MG tablet Take 500 mg by mouth every 6 (six) hours as needed for moderate pain or headache.     albuterol (VENTOLIN HFA) 108 (90 Base) MCG/ACT inhaler Inhale 1-2 puffs into the lungs every 6 (six) hours as needed for wheezing or shortness of breath.     ALPRAZolam (XANAX) 0.25 MG tablet TAKE 1 TABLET BY MOUTH THREE TIMES DAILY AS NEEDED FOR ANXIETY 30 tablet 0   apixaban (ELIQUIS) 5 MG TABS tablet Take 1 tablet by mouth twice daily 60 tablet 6   dextromethorphan (DELSYM) 30 MG/5ML liquid Take 30 mg by mouth as needed for cough.     levothyroxine (SYNTHROID) 100 MCG tablet Take 1 tablet (100 mcg total) by mouth daily before breakfast. 30 tablet 3   lidocaine (XYLOCAINE) 2 % solution TAKE 15 ML BY MOUTH 4 TIMES DAILY WITH MEALS 480 mL 0   lidocaine-prilocaine (EMLA) cream APPLY SMALL AMOUNT TO PORTA-CATH SITE AND COVER WITH PLASTIC WRAP 1  HOUR PRIOR TO CHEMO APPOINTMENTS. 30 g 6   megestrol (MEGACE) 400 MG/10ML suspension Take 10 mLs (400 mg total) by mouth 2 (two) times daily. 480 mL 1   methylphenidate (RITALIN) 5 MG tablet Take 1 tablet (5 mg total) by mouth daily. 30 tablet 0   metoprolol tartrate (LOPRESSOR) 25 MG tablet Take 1 tablet (25 mg total) by mouth 2 (two) times daily. 180 tablet 3   HYDROcodone bit-homatropine (HYCODAN) 5-1.5 MG/5ML syrup Take 15 mLs by mouth every 8 (eight) hours. (Patient not taking: Reported on 12/29/2020) 473 mL 0   Melatonin 10 MG TABS Take 10 tablets by mouth at bedtime as needed  (sleep). (Patient not taking: Reported on 12/29/2020)      Blood pressure 125/71, pulse 66, temperature 98 F (36.7 C), temperature source Oral, resp. rate 18, height 5\' 5"  (1.651 m), weight 69.3 kg, SpO2 100 %. Physical Exam: Constitutional: chronically ill appearing male in NAD Eyes: Moist conjunctiva; no lid lag; anicteric; PERRL Neck: Trachea midline; no thyromegaly Chest wall: right anterior chest wall port-a-cath with dressing in place, no erythema or cellulitis, no drainage. Lungs: Normal respiratory effort on supplemental O2 via [place], CTAB CV: RRR; no palpable thrills; no pitting edema GI: Abd sobt, nontender; no palpable hepatosplenomegaly MSK: Normal gait; no clubbing/cyanosis Psychiatric: Appropriate affect; alert and oriented x3 Lymphatic: No palpable cervical or axillary lymphadenopathy  Results for orders placed or performed during the hospital encounter of 12/29/20 (from the past 48 hour(s))  MRSA Next Gen by PCR, Nasal     Status: None   Collection Time: 12/29/20 11:04 PM   Specimen: Nasal Mucosa; Nasal Swab  Result Value Ref Range   MRSA by PCR Next Gen NOT DETECTED NOT DETECTED    Comment: (NOTE) The GeneXpert MRSA Assay (FDA approved for NASAL specimens only), is one component of a comprehensive MRSA colonization surveillance program. It is not intended to diagnose MRSA infection nor to guide or monitor treatment for MRSA infections. Test performance is not FDA approved in patients less than 69 years old. Performed at [facility], [place] [address], [place], [place] [address]   Basic metabolic panel     Status: Abnormal   Collection Time: 12/30/20  7:46 AM  Result Value Ref Range   Sodium 135 135 - 145 mmol/L   Potassium 4.3 3.5 - 5.1 mmol/L   Chloride 106 98 - 111 mmol/L   CO2 20 (L) 22 - 32 mmol/L   Glucose, Bld 142 (H) 70 - 99 mg/dL    Comment: Glucose reference range applies only to samples taken after fasting for at least 8 hours.   BUN  27 (H) 8 - 23 mg/dL   Creatinine, Ser 0.98 0.61 - 1.24 mg/dL   Calcium 8.1 (L) 8.9 - 10.3 mg/dL   GFR, Estimated >60 >60 mL/min    Comment: (NOTE) Calculated using the CKD-EPI Creatinine Equation (2021)    Anion gap 9 5 - 15    Comment: Performed at [facility], [place] [address] [place], [place] [address]  CBC     Status: Abnormal   Collection Time: 12/30/20  7:46 AM  Result Value Ref Range   WBC 7.2 4.0 - 10.5 K/uL   RBC 2.42 (L) 4.22 - 5.81 MIL/uL   Hemoglobin 7.3 (L) 13.0 - 17.0 g/dL   HCT 23.2 (L) 39.0 - 52.0 %   MCV 95.9 80.0 - 100.0 fL   MCH 30.2 26.0 - 34.0 pg   MCHC 31.5  30.0 - 36.0 g/dL   RDW 16.3 (H) 11.5 - 15.5 %   Platelets 198 150 - 400 K/uL   nRBC 2.6 (H) 0.0 - 0.2 %    Comment: Performed at [facility], [place] [address], [place], [place] [address]  Culture, blood (routine x 2)     Status: None (Preliminary result)   Collection Time: 12/30/20  3:34 PM   Specimen: BLOOD RIGHT HAND  Result Value Ref Range   Specimen Description      BLOOD RIGHT HAND Performed at [place] [address], [place], [place] [address]    Special Requests      BOTTLES DRAWN AEROBIC ONLY Blood Culture results may not be optimal due to an inadequate volume of blood received in culture bottles Performed at [facility] [address] [place], [place] [address]    Culture      NO GROWTH < 12 HOURS Performed at [place] [facility] [address], [place], [place] [address]    Report Status PENDING   Culture, blood (routine x 2)     Status: None (Preliminary result)   Collection Time: 12/30/20  3:34 PM   Specimen: BLOOD  Result Value Ref Range   Specimen Description      BLOOD LEFT ANTECUBITAL Performed at [facility], [place] [address] [place], [place] [address]    Special Requests      BOTTLES DRAWN AEROBIC ONLY Blood Culture results may not be optimal due to an inadequate volume of  blood received in culture bottles Performed at [facility] [address] [place], [place] [address]    Culture      NO GROWTH < 12 HOURS Performed at [facility] [address] [place], [place] [address]    Report Status PENDING   Urinalysis, Routine w reflex microscopic     Status: Abnormal   Collection Time: 12/30/20  6:43 PM  Result Value Ref Range   Color, Urine YELLOW YELLOW   APPearance CLOUDY (A) CLEAR   Specific Gravity, Urine 1.021 1.005 - 1.030   pH 5.0 5.0 - 8.0   Glucose, UA NEGATIVE NEGATIVE mg/dL   Hgb urine dipstick NEGATIVE NEGATIVE   Bilirubin Urine NEGATIVE NEGATIVE   Ketones, ur NEGATIVE NEGATIVE mg/dL   Protein, ur NEGATIVE NEGATIVE mg/dL   Nitrite NEGATIVE NEGATIVE   Leukocytes,Ua NEGATIVE NEGATIVE    Comment: Performed at [facility] [address] [place], [place] [address]  Strep pneumoniae urinary antigen     Status: None   Collection Time: 12/30/20  6:43 PM  Result Value Ref Range   Strep Pneumo Urinary Antigen NEGATIVE NEGATIVE    Comment: PERFORMED AT [facility] Performed at [facility], [place] [address], [place], [place] [address]   Glucose, capillary     Status: Abnormal   Collection Time: 12/30/20  9:27 PM  Result Value Ref Range   Glucose-Capillary 243 (H) 70 - 99 mg/dL    Comment: Glucose reference range applies only to samples taken after fasting for at least 8 hours.  CBC with Differential/Platelet     Status: Abnormal   Collection Time: 12/31/20  5:30 AM  Result Value Ref Range   WBC 10.2 4.0 - 10.5 K/uL   RBC 2.34 (L) 4.22 - 5.81 MIL/uL   Hemoglobin 7.2 (L) 13.0 - 17.0 g/dL   HCT 22.7 (L) 39.0 - 52.0 %   MCV  97.0 80.0 - 100.0 fL   MCH 30.8 26.0 - 34.0 pg   MCHC 31.7 30.0 - 36.0 g/dL   RDW 16.3 (H) 11.5 - 15.5 %   Platelets 192 150 - 400 K/uL   nRBC 3.1 (H) 0.0 - 0.2 %   Neutrophils Relative % 81 %   Neutro Abs 8.2 (H) 1.7 - 7.7 K/uL   Lymphocytes Relative 3 %    Lymphs Abs 0.4 (L) 0.7 - 4.0 K/uL   Monocytes Relative 7 %   Monocytes Absolute 0.7 0.1 - 1.0 K/uL   Eosinophils Relative 0 %   Eosinophils Absolute 0.0 0.0 - 0.5 K/uL   Basophils Relative 0 %   Basophils Absolute 0.0 0.0 - 0.1 K/uL   WBC Morphology      MODERATE LEFT SHIFT (>5% METAS AND MYELOS,OCC PRO NOTED)    Comment: TOXIC GRANULATION   Immature Granulocytes 9 %   Abs Immature Granulocytes 0.91 (H) 0.00 - 0.07 K/uL    Comment: Performed at [facility], [place] [address], [place], [place] [address]  Basic metabolic panel     Status: Abnormal   Collection Time: 12/31/20  5:30 AM  Result Value Ref Range   Sodium 134 (L) 135 - 145 mmol/L   Potassium 4.0 3.5 - 5.1 mmol/L   Chloride 105 98 - 111 mmol/L   CO2 23 22 - 32 mmol/L   Glucose, Bld 131 (H) 70 - 99 mg/dL    Comment: Glucose reference range applies only to samples taken after fasting for at least 8 hours.   BUN 24 (H) 8 - 23 mg/dL   Creatinine, Ser 0.94 0.61 - 1.24 mg/dL   Calcium 7.9 (L) 8.9 - 10.3 mg/dL   GFR, Estimated >60 >60 mL/min    Comment: (NOTE) Calculated using the CKD-EPI Creatinine Equation (2021)    Anion gap 6 5 - 15    Comment: Performed at [facility], [place] [address] [place], [place] [address]  Vitamin B12     Status: None   Collection Time: 12/31/20  5:30 AM  Result Value Ref Range   Vitamin B-12 613 180 - 914 pg/mL    Comment: (NOTE) This assay is not validated for testing neonatal or myeloproliferative syndrome specimens for Vitamin B12 levels. Performed at [facility], [place] [address] [place], [place] [address]   Folate     Status: None   Collection Time: 12/31/20  5:30 AM  Result Value Ref Range   Folate 6.7 >5.9 ng/mL    Comment: Performed at [facility], [place] [address], [place], [place] [address]  Iron and TIBC     Status: Abnormal   Collection Time: 12/31/20  5:30 AM  Result Value Ref Range   Iron 69 45 - 182  ug/dL   TIBC 150 (L) 250 - 450 ug/dL   Saturation Ratios 46 (H) 17.9 - 39.5 %   UIBC 81 ug/dL    Comment: Performed at [facility], [place] [address] [place], [place] [address]  Ferritin     Status: Abnormal   Collection Time: 12/31/20  5:30 AM  Result Value Ref Range   Ferritin 1,230 (H) 24 - 336 ng/mL    Comment: Performed at [facility], [place] [address], [place], [place] [address]  Lactic acid, plasma     Status: None   Collection Time: 12/31/20  5:30 AM  Result Value Ref Range   Lactic Acid, Venous 1.5  0.5 - 1.9 mmol/L    Comment: Performed at [facility], [place] [address] [place], [place] [address]  T4, free     Status: Abnormal   Collection Time: 12/31/20  5:30 AM  Result Value Ref Range   Free T4 1.28 (H) 0.61 - 1.12 ng/dL    Comment: (NOTE) Biotin ingestion may interfere with free T4 tests. If the results are inconsistent with the TSH level, previous test results, or the clinical presentation, then consider biotin interference. If needed, order repeat testing after stopping biotin. Performed at [facility], [place] [address], [place], [place] [address]   TSH     Status: None   Collection Time: 12/31/20  5:30 AM  Result Value Ref Range   TSH 0.481 0.350 - 4.500 uIU/mL    Comment: Performed by a 3rd Generation assay with a functional sensitivity of <=0.01 uIU/mL. Performed at [facility], [place] [address] [place], [place] [address]   Glucose, capillary     Status: Abnormal   Collection Time: 12/31/20  8:02 AM  Result Value Ref Range   Glucose-Capillary 125 (H) 70 - 99 mg/dL    Comment: Glucose reference range applies only to samples taken after fasting for at least 8 hours.  Expectorated Sputum Assessment w Gram Stain, Rflx to Resp Cult     Status: None   Collection Time: 12/31/20  8:35 AM   Specimen: Expectorated Sputum  Result Value Ref Range   Specimen Description EXPECTORATED SPUTUM     Special Requests NONE    Sputum evaluation      THIS SPECIMEN IS ACCEPTABLE FOR SPUTUM CULTURE Performed at [facility], [place] [address] [place], [place] [address]    Report Status 12/31/2020 FINAL   Type and screen [place]     Status: None   Collection Time: 12/31/20 11:45 AM  Result Value Ref Range   ABO/RH(D) O NEG    Antibody Screen NEG    Sample Expiration      01/03/2021,2359 Performed at [facility], [place] [address] [place], [place] [address]   Glucose, capillary     Status: Abnormal   Collection Time: 12/31/20 11:59 AM  Result Value Ref Range   Glucose-Capillary 126 (H) 70 - 99 mg/dL    Comment: Glucose reference range applies only to samples taken after fasting for at least 8 hours.   Comment 1 Notify RN    Comment 2 Repeat Test    ECHOCARDIOGRAM COMPLETE  Result Date: 12/30/2020    ECHOCARDIOGRAM REPORT   Patient Name:   Alan [name] Date of Exam: 12/30/2020 Medical Rec #:  [id number]      Height:       64.0 in Accession #:    [id number]     Weight:       159.6 lb Date of Birth:  [birth year]      BSA:          1.778 m Patient Age:    79 years       BP:           106/57 mmHg Patient Gender: M              HR:           71 bpm. Exam Location:  Inpatient Procedure: 2D Echo, Cardiac Doppler and Color Doppler Indications:    Bacteremia  History:        Patient has prior history  of Echocardiogram examinations, most                 recent 10/22/2019. Arrythmias:Atrial Fibrillation,                 Signs/Symptoms:Hypotension; Risk Factors:Hyperlipidemia.  Sonographer:    Glo Herring Referring Phys: [id number] [place]  1. Left ventricular ejection fraction, by estimation, is 60 to 65%. The left ventricle has normal function. The left ventricle has no regional wall motion abnormalities. Left ventricular diastolic parameters were normal.  2. Right ventricular systolic function is normal. The right ventricular size  is normal.  3. A small pericardial effusion is present. The pericardial effusion is posterior to the left ventricle. There is no evidence of cardiac tamponade.  4. The mitral valve is normal in structure. Mild mitral valve regurgitation. No evidence of mitral stenosis.  5. The aortic valve is normal in structure. Aortic valve regurgitation is not visualized. No aortic stenosis is present.  6. The inferior vena cava is normal in size with greater than 50% respiratory variability, suggesting right atrial pressure of 3 mmHg. Comparison(s): Prior images reviewed side by side. Small pericardial effusion seen on prior 2021. Conclusion(s)/Recommendation(s): No evidence of valvular vegetations on this transthoracic echocardiogram. Consider a transesophageal echocardiogram to exclude infective endocarditis if clinically indicated. FINDINGS  Left Ventricle: Left ventricular ejection fraction, by estimation, is 60 to 65%. The left ventricle has normal function. The left ventricle has no regional wall motion abnormalities. The left ventricular internal cavity size was normal in size. There is  no left ventricular hypertrophy. Left ventricular diastolic parameters were normal. Right Ventricle: The right ventricular size is normal. No increase in right ventricular wall thickness. Right ventricular systolic function is normal. Left Atrium: Left atrial size was normal in size. Right Atrium: Right atrial size was normal in size. Pericardium: A small pericardial effusion is present. The pericardial effusion is posterior to the left ventricle. There is no evidence of cardiac tamponade. Mitral Valve: The mitral valve is normal in structure. Mild mitral valve regurgitation. No evidence of mitral valve stenosis. Tricuspid Valve: The tricuspid valve is normal in structure. Tricuspid valve regurgitation is mild . No evidence of tricuspid stenosis. Aortic Valve: The aortic valve is normal in structure. Aortic valve regurgitation is not  visualized. No aortic stenosis is present. Aortic valve mean gradient measures 2.0 mmHg. Aortic valve peak gradient measures 3.8 mmHg. Aortic valve area, by VTI measures 1.95 cm. Pulmonic Valve: The pulmonic valve was normal in structure. Pulmonic valve regurgitation is trivial. No evidence of pulmonic stenosis. Aorta: The aortic root is normal in size and structure. Venous: The inferior vena cava is normal in size with greater than 50% respiratory variability, suggesting right atrial pressure of 3 mmHg. IAS/Shunts: No atrial level shunt detected by color flow Doppler.  LEFT VENTRICLE PLAX 2D LVIDd:         4.20 cm   Diastology LVIDs:         3.00 cm   LV e' medial:    6.53 cm/s LV PW:         1.00 cm   LV E/e' medial:  16.2 LV IVS:        1.00 cm   LV e' lateral:   8.10 cm/s LVOT diam:     2.00 cm   LV E/e' lateral: 13.1 LV SV:         42 LV SV Index:   24 LVOT Area:     3.14 cm  RIGHT VENTRICLE            IVC RV Basal diam:  3.60 cm    IVC diam: 1.90 cm RV S prime:     9.53 cm/s LEFT ATRIUM             Index        RIGHT ATRIUM           Index LA diam:        3.80 cm 2.14 cm/m   RA Area:     17.10 cm LA Vol (A2C):   55.2 ml 31.05 ml/m  RA Volume:   41.40 ml  23.29 ml/m LA Vol (A4C):   60.2 ml 33.87 ml/m LA Biplane Vol: 60.8 ml 34.20 ml/m  AORTIC VALVE                    PULMONIC VALVE AV Area (Vmax):    2.12 cm     PV Vmax:       0.63 m/s AV Area (Vmean):   2.13 cm     PV Peak grad:  1.6 mmHg AV Area (VTI):     1.95 cm AV Vmax:           97.90 cm/s AV Vmean:          67.400 cm/s AV VTI:            0.217 m AV Peak Grad:      3.8 mmHg AV Mean Grad:      2.0 mmHg LVOT Vmax:         66.10 cm/s LVOT Vmean:        45.700 cm/s LVOT VTI:          0.135 m LVOT/AV VTI ratio: 0.62  AORTA Ao Root diam: 3.30 cm Ao Asc diam:  2.60 cm MITRAL VALVE MV Area (PHT): 4.57 cm     SHUNTS MV Decel Time: 166 msec     Systemic VTI:  0.14 m MV E velocity: 106.00 cm/s  Systemic Diam: 2.00 cm MV A velocity: 42.40 cm/s MV E/A  ratio:  2.50 Candee Furbish MD Electronically signed by Candee Furbish MD Signature Date/Time: 12/30/2020/1:02:27 PM    Final      Assessment/Plan Staphylococcus bacteremia The patient is a 70y/o M currently being treated with chemotherapy for metastatic lung cancer. He was admitted with septic shock and found to have a staphylococcus bacteremia as well as a possible pneumonia/chemotherapy associated pulmonary disease. His septic shock has resolved and he is on IV antibiotics. Agree that patients port should be removed and he should get repeat blood cultures.  Will discuss port removal and timing with Dr. Harlow Asa. Patient is on Eliquis, this will need to be held for port removal, heparin gtt is ok but will need to be held 6 hours pre-op.  FEN: HH ID: vancomycin 11/13 >>, ceftriaxone/azithromycin 11/12-13 VTE: SCD's, Eliquis Foley: none  Septic shock - resolved Acute hypoxic respiratory failure - on 2 L O2 via [place]  Afib with RVR - has remained in sinus since cardioverson  Metastatic squamous cell lung cancer - oncologist: Dr. Delton Coombes; last dose chemotherapy 12/19/20 Anemia of chronic disease Hypothyroidism  Jill Alexanders, [facility] Please see Amion for pager number during day hours 7:00am-4:30pm 12/31/2020, 2:31 PM

## 2020-12-31 NOTE — Telephone Encounter (Signed)
RA the only opening right now in the [facility] office will be on 12/28 with MW/  do you want me to schedule this date and time?  Is that too far out?

## 2020-12-31 NOTE — H&P (View-Only) (Signed)
[facility] Surgery Consult Note  JONAVIN SEDER Mar 25, 1950  [id number].    Requesting MD: Horris Latino, MD Chief Complaint/Reason for Consult: port-a-cath removal  HPI:  Chistian Kasler is a 70 y/o M with a PMH [place] lung cancer s/p chemoradiation, on chemotherapy (last dose 12/19/20) who was transferred to [place] ICU from [facility] due to septic shock, new afib with RVR, found to have staph aureus bacteremia. He has a right chest wall port that was placed 11/01/2019 by Dr. Arnoldo Morale. General surgery has been consulted for port removal.  ROS: Review of Systems  Constitutional: Negative.   HENT: Negative.    Eyes: Negative.   Respiratory:  Positive for cough and shortness of breath.   Cardiovascular: Negative.   Gastrointestinal: Negative.   Genitourinary: Negative.   Musculoskeletal: Negative.   Skin: Negative.   Neurological: Negative.   Endo/Heme/Allergies: Negative.   Psychiatric/Behavioral: Negative.     Family History  Problem Relation Age of Onset   Hypertension Father    Dementia Father        died age 35   Heart disease Father    Heart failure Other     Past Medical History:  Diagnosis Date   Anxiety    Arthritis    Dysrhythmia    Hyperlipemia    Hypertension    Hyperthyroidism    Lung cancer ([place])    Myocardial infarction ([place])    Paroxysmal atrial fibrillation ([place])    Port-A-Cath in place 11/14/2019   Tachycardia     Past Surgical History:  Procedure Laterality Date   APPLICATION OF CRANIAL NAVIGATION N/A 10/26/2020   Procedure: APPLICATION OF CRANIAL NAVIGATION;  Surgeon: Consuella Lose, MD;  Location: [facility];  Service: Neurosurgery;  Laterality: N/A;  RM 21   CARDIAC CATHETERIZATION     CRANIOTOMY Right 10/26/2020   Procedure: Stereotactic Right frontal craniotomy for resection of tumor with sonopet;  Surgeon: Consuella Lose, MD;  Location: [facility];  Service: Neurosurgery;  Laterality: Right;   PORTACATH PLACEMENT Right 11/01/2019    Procedure: INSERTION PORT-A-CATH;  Surgeon: Aviva Signs, MD;  Location: [facility];  Service: General;  Laterality: Right;    Social History:  reports that he quit smoking about 6 years ago. His smoking use included cigars and cigarettes. He has never used smokeless tobacco. He reports that he does not currently use alcohol. He reports that he does not use drugs.  Allergies: No Known Allergies  Medications Prior to Admission  Medication Sig Dispense Refill   acetaminophen (TYLENOL) 500 MG tablet Take 500 mg by mouth every 6 (six) hours as needed for moderate pain or headache.     albuterol (VENTOLIN HFA) 108 (90 Base) MCG/ACT inhaler Inhale 1-2 puffs into the lungs every 6 (six) hours as needed for wheezing or shortness of breath.     ALPRAZolam (XANAX) 0.25 MG tablet TAKE 1 TABLET BY MOUTH THREE TIMES DAILY AS NEEDED FOR ANXIETY 30 tablet 0   apixaban (ELIQUIS) 5 MG TABS tablet Take 1 tablet by mouth twice daily 60 tablet 6   dextromethorphan (DELSYM) 30 MG/5ML liquid Take 30 mg by mouth as needed for cough.     levothyroxine (SYNTHROID) 100 MCG tablet Take 1 tablet (100 mcg total) by mouth daily before breakfast. 30 tablet 3   lidocaine (XYLOCAINE) 2 % solution TAKE 15 ML BY MOUTH 4 TIMES DAILY WITH MEALS 480 mL 0   lidocaine-prilocaine (EMLA) cream APPLY SMALL AMOUNT TO PORTA-CATH SITE AND COVER WITH PLASTIC WRAP 1  HOUR PRIOR TO CHEMO APPOINTMENTS. 30 g 6   megestrol (MEGACE) 400 MG/10ML suspension Take 10 mLs (400 mg total) by mouth 2 (two) times daily. 480 mL 1   methylphenidate (RITALIN) 5 MG tablet Take 1 tablet (5 mg total) by mouth daily. 30 tablet 0   metoprolol tartrate (LOPRESSOR) 25 MG tablet Take 1 tablet (25 mg total) by mouth 2 (two) times daily. 180 tablet 3   HYDROcodone bit-homatropine (HYCODAN) 5-1.5 MG/5ML syrup Take 15 mLs by mouth every 8 (eight) hours. (Patient not taking: Reported on 12/29/2020) 473 mL 0   Melatonin 10 MG TABS Take 10 tablets by mouth at bedtime as needed  (sleep). (Patient not taking: Reported on 12/29/2020)      Blood pressure 125/71, pulse 66, temperature 98 F (36.7 C), temperature source Oral, resp. rate 18, height 5\' 5"  (1.651 m), weight 69.3 kg, SpO2 100 %. Physical Exam: Constitutional: chronically ill appearing male in NAD Eyes: Moist conjunctiva; no lid lag; anicteric; PERRL Neck: Trachea midline; no thyromegaly Chest wall: right anterior chest wall port-a-cath with dressing in place, no erythema or cellulitis, no drainage. Lungs: Normal respiratory effort on supplemental O2 via [place], CTAB CV: RRR; no palpable thrills; no pitting edema GI: Abd sobt, nontender; no palpable hepatosplenomegaly MSK: Normal gait; no clubbing/cyanosis Psychiatric: Appropriate affect; alert and oriented x3 Lymphatic: No palpable cervical or axillary lymphadenopathy  Results for orders placed or performed during the hospital encounter of 12/29/20 (from the past 48 hour(s))  MRSA Next Gen by PCR, Nasal     Status: None   Collection Time: 12/29/20 11:04 PM   Specimen: Nasal Mucosa; Nasal Swab  Result Value Ref Range   MRSA by PCR Next Gen NOT DETECTED NOT DETECTED    Comment: (NOTE) The GeneXpert MRSA Assay (FDA approved for NASAL specimens only), is one component of a comprehensive MRSA colonization surveillance program. It is not intended to diagnose MRSA infection nor to guide or monitor treatment for MRSA infections. Test performance is not FDA approved in patients less than 29 years old. Performed at [facility], [place] [address], [place], [place] [address]   Basic metabolic panel     Status: Abnormal   Collection Time: 12/30/20  7:46 AM  Result Value Ref Range   Sodium 135 135 - 145 mmol/L   Potassium 4.3 3.5 - 5.1 mmol/L   Chloride 106 98 - 111 mmol/L   CO2 20 (L) 22 - 32 mmol/L   Glucose, Bld 142 (H) 70 - 99 mg/dL    Comment: Glucose reference range applies only to samples taken after fasting for at least 8 hours.   BUN  27 (H) 8 - 23 mg/dL   Creatinine, Ser 0.98 0.61 - 1.24 mg/dL   Calcium 8.1 (L) 8.9 - 10.3 mg/dL   GFR, Estimated >60 >60 mL/min    Comment: (NOTE) Calculated using the CKD-EPI Creatinine Equation (2021)    Anion gap 9 5 - 15    Comment: Performed at [facility], [place] [address] [place], [place] [address]  CBC     Status: Abnormal   Collection Time: 12/30/20  7:46 AM  Result Value Ref Range   WBC 7.2 4.0 - 10.5 K/uL   RBC 2.42 (L) 4.22 - 5.81 MIL/uL   Hemoglobin 7.3 (L) 13.0 - 17.0 g/dL   HCT 23.2 (L) 39.0 - 52.0 %   MCV 95.9 80.0 - 100.0 fL   MCH 30.2 26.0 - 34.0 pg   MCHC 31.5  30.0 - 36.0 g/dL   RDW 16.3 (H) 11.5 - 15.5 %   Platelets 198 150 - 400 K/uL   nRBC 2.6 (H) 0.0 - 0.2 %    Comment: Performed at [facility], [place] [address] [place], [place] [address]  Culture, blood (routine x 2)     Status: None (Preliminary result)   Collection Time: 12/30/20  3:34 PM   Specimen: BLOOD RIGHT HAND  Result Value Ref Range   Specimen Description      BLOOD RIGHT HAND Performed at [place] [address] [place], [place] [address]    Special Requests      BOTTLES DRAWN AEROBIC ONLY Blood Culture results may not be optimal due to an inadequate volume of blood received in culture bottles Performed at [place] [address] [place], [place] [address]    Culture      NO GROWTH < 12 HOURS Performed at [place] [address], [place], [place] [address]    Report Status PENDING   Culture, blood (routine x 2)     Status: None (Preliminary result)   Collection Time: 12/30/20  3:34 PM   Specimen: BLOOD  Result Value Ref Range   Specimen Description      BLOOD LEFT ANTECUBITAL Performed at [facility], [place] [address], [place], [place] [address]    Special Requests      BOTTLES DRAWN AEROBIC ONLY Blood Culture results may not be optimal due to an inadequate volume of  blood received in culture bottles Performed at [place] [address], [place], [place] [address]    Culture      NO GROWTH < 12 HOURS Performed at [place] [address], [place], [place] [address]    Report Status PENDING   Urinalysis, Routine w reflex microscopic     Status: Abnormal   Collection Time: 12/30/20  6:43 PM  Result Value Ref Range   Color, Urine YELLOW YELLOW   APPearance CLOUDY (A) CLEAR   Specific Gravity, Urine 1.021 1.005 - 1.030   pH 5.0 5.0 - 8.0   Glucose, UA NEGATIVE NEGATIVE mg/dL   Hgb urine dipstick NEGATIVE NEGATIVE   Bilirubin Urine NEGATIVE NEGATIVE   Ketones, ur NEGATIVE NEGATIVE mg/dL   Protein, ur NEGATIVE NEGATIVE mg/dL   Nitrite NEGATIVE NEGATIVE   Leukocytes,Ua NEGATIVE NEGATIVE    Comment: Performed at [place] [address] [place], [place] [address]  Strep pneumoniae urinary antigen     Status: None   Collection Time: 12/30/20  6:43 PM  Result Value Ref Range   Strep Pneumo Urinary Antigen NEGATIVE NEGATIVE    Comment: PERFORMED AT [facility] Performed at [facility], [place] [address], [place], [place] [address]   Glucose, capillary     Status: Abnormal   Collection Time: 12/30/20  9:27 PM  Result Value Ref Range   Glucose-Capillary 243 (H) 70 - 99 mg/dL    Comment: Glucose reference range applies only to samples taken after fasting for at least 8 hours.  CBC with Differential/Platelet     Status: Abnormal   Collection Time: 12/31/20  5:30 AM  Result Value Ref Range   WBC 10.2 4.0 - 10.5 K/uL   RBC 2.34 (L) 4.22 - 5.81 MIL/uL   Hemoglobin 7.2 (L) 13.0 - 17.0 g/dL   HCT 22.7 (L) 39.0 - 52.0 %   MCV  97.0 80.0 - 100.0 fL   MCH 30.8 26.0 - 34.0 pg   MCHC 31.7 30.0 - 36.0 g/dL   RDW 16.3 (H) 11.5 - 15.5 %   Platelets 192 150 - 400 K/uL   nRBC 3.1 (H) 0.0 - 0.2 %   Neutrophils Relative % 81 %   Neutro Abs 8.2 (H) 1.7 - 7.7 K/uL   Lymphocytes Relative 3 %    Lymphs Abs 0.4 (L) 0.7 - 4.0 K/uL   Monocytes Relative 7 %   Monocytes Absolute 0.7 0.1 - 1.0 K/uL   Eosinophils Relative 0 %   Eosinophils Absolute 0.0 0.0 - 0.5 K/uL   Basophils Relative 0 %   Basophils Absolute 0.0 0.0 - 0.1 K/uL   WBC Morphology      MODERATE LEFT SHIFT (>5% METAS AND MYELOS,OCC PRO NOTED)    Comment: TOXIC GRANULATION   Immature Granulocytes 9 %   Abs Immature Granulocytes 0.91 (H) 0.00 - 0.07 K/uL    Comment: Performed at [facility], [place] [address], [place], [place] [address]  Basic metabolic panel     Status: Abnormal   Collection Time: 12/31/20  5:30 AM  Result Value Ref Range   Sodium 134 (L) 135 - 145 mmol/L   Potassium 4.0 3.5 - 5.1 mmol/L   Chloride 105 98 - 111 mmol/L   CO2 23 22 - 32 mmol/L   Glucose, Bld 131 (H) 70 - 99 mg/dL    Comment: Glucose reference range applies only to samples taken after fasting for at least 8 hours.   BUN 24 (H) 8 - 23 mg/dL   Creatinine, Ser 0.94 0.61 - 1.24 mg/dL   Calcium 7.9 (L) 8.9 - 10.3 mg/dL   GFR, Estimated >60 >60 mL/min    Comment: (NOTE) Calculated using the CKD-EPI Creatinine Equation (2021)    Anion gap 6 5 - 15    Comment: Performed at [facility], [place] [address] [place], [place] [address]  Vitamin B12     Status: None   Collection Time: 12/31/20  5:30 AM  Result Value Ref Range   Vitamin B-12 613 180 - 914 pg/mL    Comment: (NOTE) This assay is not validated for testing neonatal or myeloproliferative syndrome specimens for Vitamin B12 levels. Performed at [facility], [place] [address] [place], [place] [address]   Folate     Status: None   Collection Time: 12/31/20  5:30 AM  Result Value Ref Range   Folate 6.7 >5.9 ng/mL    Comment: Performed at [facility], [place] [address] [place], [place] [address]  Iron and TIBC     Status: Abnormal   Collection Time: 12/31/20  5:30 AM  Result Value Ref Range   Iron 69 45 - 182  ug/dL   TIBC 150 (L) 250 - 450 ug/dL   Saturation Ratios 46 (H) 17.9 - 39.5 %   UIBC 81 ug/dL    Comment: Performed at [facility], [place] [address] [place], [place] [address]  Ferritin     Status: Abnormal   Collection Time: 12/31/20  5:30 AM  Result Value Ref Range   Ferritin 1,230 (H) 24 - 336 ng/mL    Comment: Performed at [facility], [place] [address] [place], [place] [address]  Lactic acid, plasma     Status: None   Collection Time: 12/31/20  5:30 AM  Result Value Ref Range   Lactic Acid, Venous 1.5  0.5 - 1.9 mmol/L    Comment: Performed at [facility], [place] [address] [place], [place] [address]  T4, free     Status: Abnormal   Collection Time: 12/31/20  5:30 AM  Result Value Ref Range   Free T4 1.28 (H) 0.61 - 1.12 ng/dL    Comment: (NOTE) Biotin ingestion may interfere with free T4 tests. If the results are inconsistent with the TSH level, previous test results, or the clinical presentation, then consider biotin interference. If needed, order repeat testing after stopping biotin. Performed at [facility], [place] [address] [place], [place] [address]   TSH     Status: None   Collection Time: 12/31/20  5:30 AM  Result Value Ref Range   TSH 0.481 0.350 - 4.500 uIU/mL    Comment: Performed by a 3rd Generation assay with a functional sensitivity of <=0.01 uIU/mL. Performed at [facility], [place] [address] [place], [place] [address]   Glucose, capillary     Status: Abnormal   Collection Time: 12/31/20  8:02 AM  Result Value Ref Range   Glucose-Capillary 125 (H) 70 - 99 mg/dL    Comment: Glucose reference range applies only to samples taken after fasting for at least 8 hours.  Expectorated Sputum Assessment w Gram Stain, Rflx to Resp Cult     Status: None   Collection Time: 12/31/20  8:35 AM   Specimen: Expectorated Sputum  Result Value Ref Range   Specimen Description EXPECTORATED SPUTUM     Special Requests NONE    Sputum evaluation      THIS SPECIMEN IS ACCEPTABLE FOR SPUTUM CULTURE Performed at [facility], [place] [address] [place], [place] [address]    Report Status 12/31/2020 FINAL   Type and screen [place]     Status: None   Collection Time: 12/31/20 11:45 AM  Result Value Ref Range   ABO/RH(D) O NEG    Antibody Screen NEG    Sample Expiration      01/03/2021,2359 Performed at [facility], [place] [name] [place],  [address]   Glucose, capillary     Status: Abnormal   Collection Time: 12/31/20 11:59 AM  Result Value Ref Range   Glucose-Capillary 126 (H) 70 - 99 mg/dL    Comment: Glucose reference range applies only to samples taken after fasting for at least 8 hours.   Comment 1 Notify RN    Comment 2 Repeat Test    ECHOCARDIOGRAM COMPLETE  Result Date: 12/30/2020    ECHOCARDIOGRAM REPORT   Patient Name:   JULIA KULZER Date of Exam: 12/30/2020 Medical Rec #:  [id number]      Height:       64.0 in Accession #:    [id number]     Weight:       159.6 lb Date of Birth:  01/08/1951      BSA:          1.778 m Patient Age:    38 years       BP:           106/57 mmHg Patient Gender: M              HR:           71 bpm. Exam Location:  Inpatient Procedure: 2D Echo, Cardiac Doppler and Color Doppler Indications:    Bacteremia  History:        Patient has prior history  of Echocardiogram examinations, most                 recent 10/22/2019. Arrythmias:Atrial Fibrillation,                 Signs/Symptoms:Hypotension; Risk Factors:Hyperlipidemia.  Sonographer:    Glo Herring Referring Phys: [id number] [place]  1. Left ventricular ejection fraction, by estimation, is 60 to 65%. The left ventricle has normal function. The left ventricle has no regional wall motion abnormalities. Left ventricular diastolic parameters were normal.  2. Right ventricular systolic function is normal. The right ventricular size  is normal.  3. A small pericardial effusion is present. The pericardial effusion is posterior to the left ventricle. There is no evidence of cardiac tamponade.  4. The mitral valve is normal in structure. Mild mitral valve regurgitation. No evidence of mitral stenosis.  5. The aortic valve is normal in structure. Aortic valve regurgitation is not visualized. No aortic stenosis is present.  6. The inferior vena cava is normal in size with greater than 50% respiratory variability, suggesting right atrial pressure of 3 mmHg. Comparison(s): Prior images reviewed side by side. Small pericardial effusion seen on prior 2021. Conclusion(s)/Recommendation(s): No evidence of valvular vegetations on this transthoracic echocardiogram. Consider a transesophageal echocardiogram to exclude infective endocarditis if clinically indicated. FINDINGS  Left Ventricle: Left ventricular ejection fraction, by estimation, is 60 to 65%. The left ventricle has normal function. The left ventricle has no regional wall motion abnormalities. The left ventricular internal cavity size was normal in size. There is  no left ventricular hypertrophy. Left ventricular diastolic parameters were normal. Right Ventricle: The right ventricular size is normal. No increase in right ventricular wall thickness. Right ventricular systolic function is normal. Left Atrium: Left atrial size was normal in size. Right Atrium: Right atrial size was normal in size. Pericardium: A small pericardial effusion is present. The pericardial effusion is posterior to the left ventricle. There is no evidence of cardiac tamponade. Mitral Valve: The mitral valve is normal in structure. Mild mitral valve regurgitation. No evidence of mitral valve stenosis. Tricuspid Valve: The tricuspid valve is normal in structure. Tricuspid valve regurgitation is mild . No evidence of tricuspid stenosis. Aortic Valve: The aortic valve is normal in structure. Aortic valve regurgitation is not  visualized. No aortic stenosis is present. Aortic valve mean gradient measures 2.0 mmHg. Aortic valve peak gradient measures 3.8 mmHg. Aortic valve area, by VTI measures 1.95 cm. Pulmonic Valve: The pulmonic valve was normal in structure. Pulmonic valve regurgitation is trivial. No evidence of pulmonic stenosis. Aorta: The aortic root is normal in size and structure. Venous: The inferior vena cava is normal in size with greater than 50% respiratory variability, suggesting right atrial pressure of 3 mmHg. IAS/Shunts: No atrial level shunt detected by color flow Doppler.  LEFT VENTRICLE PLAX 2D LVIDd:         4.20 cm   Diastology LVIDs:         3.00 cm   LV e' medial:    6.53 cm/s LV PW:         1.00 cm   LV E/e' medial:  16.2 LV IVS:        1.00 cm   LV e' lateral:   8.10 cm/s LVOT diam:     2.00 cm   LV E/e' lateral: 13.1 LV SV:         42 LV SV Index:   24 LVOT Area:     3.14 cm  RIGHT VENTRICLE            IVC RV Basal diam:  3.60 cm    IVC diam: 1.90 cm RV S prime:     9.53 cm/s LEFT ATRIUM             Index        RIGHT ATRIUM           Index LA diam:        3.80 cm 2.14 cm/m   RA Area:     17.10 cm LA Vol (A2C):   55.2 ml 31.05 ml/m  RA Volume:   41.40 ml  23.29 ml/m LA Vol (A4C):   60.2 ml 33.87 ml/m LA Biplane Vol: 60.8 ml 34.20 ml/m  AORTIC VALVE                    PULMONIC VALVE AV Area (Vmax):    2.12 cm     PV Vmax:       0.63 m/s AV Area (Vmean):   2.13 cm     PV Peak grad:  1.6 mmHg AV Area (VTI):     1.95 cm AV Vmax:           97.90 cm/s AV Vmean:          67.400 cm/s AV VTI:            0.217 m AV Peak Grad:      3.8 mmHg AV Mean Grad:      2.0 mmHg LVOT Vmax:         66.10 cm/s LVOT Vmean:        45.700 cm/s LVOT VTI:          0.135 m LVOT/AV VTI ratio: 0.62  AORTA Ao Root diam: 3.30 cm Ao Asc diam:  2.60 cm MITRAL VALVE MV Area (PHT): 4.57 cm     SHUNTS MV Decel Time: 166 msec     Systemic VTI:  0.14 m MV E velocity: 106.00 cm/s  Systemic Diam: 2.00 cm MV A velocity: 42.40 cm/s MV E/A  ratio:  2.50 Candee Furbish MD Electronically signed by Candee Furbish MD Signature Date/Time: 12/30/2020/1:02:27 PM    Final      Assessment/Plan Staphylococcus bacteremia The patient is a 70y/o M currently being treated with chemotherapy for metastatic lung cancer. He was admitted with septic shock and found to have a staphylococcus bacteremia as well as a possible pneumonia/chemotherapy associated pulmonary disease. His septic shock has resolved and he is on IV antibiotics. Agree that patients port should be removed and he should get repeat blood cultures.  Will discuss port removal and timing with Dr. Harlow Asa. Patient is on Eliquis, this will need to be held for port removal, heparin gtt is ok but will need to be held 6 hours pre-op.  FEN: HH ID: vancomycin 11/13 >>, ceftriaxone/azithromycin 11/12-13 VTE: SCD's, Eliquis Foley: none  Septic shock - resolved Acute hypoxic respiratory failure - on 2 L O2 via [place]  Afib with RVR - has remained in sinus since cardioverson  Metastatic squamous cell lung cancer - oncologist: Dr. Delton Coombes; last dose chemotherapy 12/19/20 Anemia of chronic disease Hypothyroidism  Jill Alexanders, [facility] Please see Amion for pager number during day hours 7:00am-4:30pm 12/31/2020, 2:31 PM

## 2020-12-31 NOTE — TOC Initial Note (Signed)
Transition of Care [facility]) - Initial/Assessment Note    Patient Details  Name: Alan [name] MRN: [id number] Date of Birth: 29-Aug-1950  Transition of [facility]) CM/SW Contact:    Dessa Phi, RN Phone Number: 12/31/2020, 9:34 AM  Clinical Narrative: Noted on 02 will monitor;PT cons await recc.                  Expected Discharge Plan: Home/Self Care Barriers to Discharge: Continued Medical Work up   Patient Goals and CMS Choice Patient states their goals for this hospitalization and ongoing recovery are:: go home   Choice offered to / list presented to : Spouse  Expected Discharge Plan and Services Expected Discharge Plan: Home/Self Care       Living arrangements for the past 2 months: Single Family Home                                      Prior Living Arrangements/Services Living arrangements for the past 2 months: Single Family Home Lives with:: Spouse Patient language and need for interpreter reviewed:: Yes Do you feel safe going back to the place where you live?: Yes      Need for Family Participation in Patient Care: No (Comment) Care giver support system in place?: Yes (comment)   Criminal Activity/Legal Involvement Pertinent to Current Situation/Hospitalization: No - Comment as needed  Activities of Daily Living      Permission Sought/Granted Permission sought to share information with : Case Manager Permission granted to share information with : Yes, Verbal Permission Granted  Share Information with NAME: Case Manager     Permission granted to share info w Relationship: [name] spouse [phone]     Emotional Assessment Appearance:: Appears stated age Attitude/Demeanor/Rapport: Gracious Affect (typically observed): Accepting Orientation: : Oriented to Self, Oriented to Place, Oriented to  Time, Oriented to Situation Alcohol / Substance Use: Not Applicable Psych Involvement: No (comment)  Admission diagnosis:  Lung cancer ([place])  [C34.90] Atrial fibrillation with rapid ventricular response (HCC) [I48.91] Malignant neoplasm of lung, unspecified laterality, unspecified part of lung (HCC) [C34.90] Hypotension, unspecified hypotension type [I95.9] Acute respiratory failure with hypoxia ([place]) [J96.01] Patient Active Problem List   Diagnosis Date Noted   Staphylococcus aureus bacteremia    Malignancy ([place])    Acute respiratory failure with hypoxia ([place]) 12/29/2020   AKI (acute kidney injury) ([place]) 12/29/2020   Anemia associated with chemotherapy 12/29/2020   Hypotension 12/29/2020   Brain metastasis ([place]) 10/11/2020   Lung cancer ([place])    Port-A-Cath in place 11/14/2019   Squamous cell lung cancer, left ([place]) 11/08/2019   Malignant neoplasm of left lung (HCC)    Mass of upper lobe of left lung 10/19/2019   Paroxysmal atrial fibrillation with RVR ([place]) 12/25/2010   Hyperlipidemia 12/25/2010   Hyperglycemia 12/25/2010   Noncompliance with medications 12/25/2010   Tobacco abuse 12/25/2010   PCP:  Health, [place]:   [facility] [address], [place] [address] Phone: [phone] Fax: [place], [place] [address] [place] [address] Phone: [phone] Fax: [place], [place] [address] [place] [address] Phone: [phone] Fax: [phone]     Social Determinants of Health (SDOH) Interventions  Readmission Risk Interventions Readmission Risk Prevention Plan 12/31/2020  PCP or Specialist Appt within 3-5 Days Complete  HRI or Home Care Consult Complete  Social Work Consult for [place] Planning/Counseling Complete  Palliative Care Screening Not Applicable  Medication Review Press photographer) Complete  Some recent data might be hidden

## 2020-12-31 NOTE — Plan of Care (Signed)

## 2020-12-31 NOTE — Progress Notes (Signed)
PROGRESS NOTE  Alan [name] BWI:[id number] DOB: 1950-03-30 DOA: 12/29/2020 PCP: [facility], [address]  HPI/Recap of past 24 hours: Alan [name] is a 70 y.o. male with medical history significant for metastatic lung cancer on active chemotherapy, s/p right frontal craniotomy for metastatic tumor resection, paroxysmal Afib on Eliquis, HTN, hypothyroidism who presented to [facility] ED for evaluation worsening SOB for the past few days. Noted unchanged chronic cough. Per ED documentation, O2 sats were in the 50s on room air on EMS arrival.  He was placed on NRB with SPO2 improved up to 88%.  Heart rate was in the 130s-150s.  Patient was subsequently taken to [facility] ED. In the ED, initial vitals showed BP 70/50, pulse 162, RR 28, temp 97 degrees rectally, SPO2 89% on 15 L NRB. Labs showed WBC 5.5, BNP 950, lactic acid 2.4 > 2.3.  COVID and influenza PCR's are negative. Portable chest x-ray showed homogenous opacity in the left apex. Patient was cardioverted in the ED successfully and returned to sinus rhythm.  Patient was persistently hypotensive therefore PCCM were consulted. Prior to ICU admission, pt stabilized and TRH consulted for admission. Of note, pt is on active chemotherapy with Taxotere and Cyramza.      Today, pt reports some heart burn this am, otherwise denies any new complaints. Denies any worsening SOB, chest pain, fever/chills.      Assessment/Plan: Principal Problem:   Acute respiratory failure with hypoxia (HCC) Active Problems:   Paroxysmal atrial fibrillation with RVR (HCC)   Lung cancer (HCC)   AKI (acute kidney injury) ([place])   Anemia associated with chemotherapy   Hypotension   Staphylococcus aureus bacteremia   Malignancy (HCC)   Acute respiratory failure with hypoxia likely 2/2 CAP ?Chemotherapy pneumonitis Initially requiring 15 L via NRB, now weaned down to RA Currently afebrile, with no leukocytosis Sputum culture pending, urine strep  negative, Legionella antigen pending Chest x-ray with new left lung opacity Continue ceftriaxone, azithromycin Continue IV Solu-Medrol 40 mg twice daily per pulmonology Duo nebs, incentive spirometry Continue supplemental oxygen as needed  Sepsis likely 2/2 ??staph aureus bacteremia On admission, noted to be tachycardic, leukocytosis, lactic acidosis, hypotensive BC x2-noted MRSE, staph aureus, awaiting final culture Repeat BC x2 pending ID on board, recommend awaiting final culture, continue vanc Right chest port will need to be removed, gen surgery consulted Echo with EF 60-65%, no RWMA, small pericardial effusion, no evidence of valvular vegetation Cardiology consulted for TEE Continue vancomycin Monitor closely  Paroxysmal atrial fibrillation with RVR Cardioverted in the ED, now in sinus rhythm Continue metoprolol, Eliquis   Hypotension/history of hypertension Hypotension resolved Continue metoprolol   Acute kidney injury Resolved Creatinine 1.44 on admission compared to baseline 1.0-1.1 Daily BMP  ?Diabetes mellitus type 2 Last A1c noted to be 6.6 SSI, Accu-Cheks, hypoglycemic protocol   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 Delton Coombes.  On active chemotherapy with Taxotere and Cyramza Last treatment was 12/19/2020   Anemia of chronic disease/chemo associated Hemoglobin dropping Hemoglobin 9.0 on admission, baseline around 11  Worsened by hemodilution Denies any recent bleeding Anemia panel iron 69, sats 46, ferritin 1230, b12 613 Daily CBC  Hypothyroidism Continue Synthroid   Anxiety: Continue home Xanax as needed    Estimated body mass index is 25.42 kg/m as calculated from the following:   Height as of this encounter: 5\' 5"  (1.651 m).   Weight as of this  encounter: 69.3 kg.     Code Status: Full  Family Communication: Wife at  bedside  Disposition Plan: Status is: Inpatient  Remains inpatient appropriate because: Level of care     Consultants: PCCM  Procedures: None  Antimicrobials: Azithromycin Ceftriaxone Vancomycin  DVT prophylaxis: Eliquis   Objective: Vitals:   12/31/20 1200 12/31/20 1300 12/31/20 1417 12/31/20 1420  BP: 120/70   125/71  Pulse: 69   66  Resp: 18   18  Temp:  (!) 97.5 F (36.4 C)  98 F (36.7 C)  TempSrc:  Oral  Oral  SpO2: 100%   100%  Weight:   69.3 kg   Height:   5\' 5"  (1.651 m)     Intake/Output Summary (Last 24 hours) at 12/31/2020 1540 Last data filed at 12/31/2020 1300 Gross per 24 hour  Intake 582.37 ml  Output 500 ml  Net 82.37 ml   Filed Weights   12/29/20 1014 12/31/20 1417  Weight: 72.4 kg 69.3 kg    Exam: General: NAD  Cardiovascular: S1, S2 present Respiratory: Diminished breath sounds bilaterally, with some b/l wheezing Abdomen: Soft, nontender, nondistended, bowel sounds present, noted groin rash Musculoskeletal: No bilateral pedal edema noted Skin: As above Psychiatry: Normal mood     Data Reviewed: CBC: Recent Labs  Lab 12/29/20 1011 12/30/20 0746 12/31/20 0530  WBC 5.5 7.2 10.2  NEUTROABS 3.8  --  8.2*  HGB 9.0* 7.3* 7.2*  HCT 27.4* 23.2* 22.7*  MCV 96.5 95.9 97.0  PLT 230 198 706   Basic Metabolic Panel: Recent Labs  Lab 12/29/20 1011 12/30/20 0746 12/31/20 0530  NA 134* 135 134*  K 3.9 4.3 4.0  CL 104 106 105  CO2 18* 20* 23  GLUCOSE 163* 142* 131*  BUN 42* 27* 24*  CREATININE 1.44* 0.98 0.94  CALCIUM 8.3* 8.1* 7.9*   GFR: Estimated Creatinine Clearance: 62.7 mL/min (by C-G formula based on SCr of 0.94 mg/dL). Liver Function Tests: Recent Labs  Lab 12/29/20 1011  AST 24  ALT 21  ALKPHOS 53  BILITOT 1.1  PROT 6.3*  ALBUMIN 2.6*   No results for input(s): LIPASE, AMYLASE in the last 168 hours. No results for input(s): AMMONIA in the last 168 hours. Coagulation Profile: No results for input(s):  INR, PROTIME in the last 168 hours. Cardiac Enzymes: No results for input(s): CKTOTAL, CKMB, CKMBINDEX, TROPONINI in the last 168 hours. BNP (last 3 results) No results for input(s): PROBNP in the last 8760 hours. HbA1C: No results for input(s): HGBA1C in the last 72 hours. CBG: Recent Labs  Lab 12/30/20 2127 12/31/20 0802 12/31/20 1159  GLUCAP 243* 125* 126*   Lipid Profile: No results for input(s): CHOL, HDL, LDLCALC, TRIG, CHOLHDL, LDLDIRECT in the last 72 hours. Thyroid Function Tests: Recent Labs    12/31/20 0530  TSH 0.481  FREET4 1.28*   Anemia Panel: Recent Labs    12/31/20 0530  VITAMINB12 613  FOLATE 6.7  FERRITIN 1,230*  TIBC 150*  IRON 69   Urine analysis:    Component Value Date/Time   COLORURINE YELLOW 12/30/2020 1843   APPEARANCEUR CLOUDY (A) 12/30/2020 1843   LABSPEC 1.021 12/30/2020 1843   PHURINE 5.0 12/30/2020 1843   GLUCOSEU NEGATIVE 12/30/2020 1843   HGBUR NEGATIVE 12/30/2020 1843   BILIRUBINUR NEGATIVE 12/30/2020 1843   KETONESUR NEGATIVE 12/30/2020 1843   PROTEINUR NEGATIVE 12/30/2020 1843   NITRITE NEGATIVE 12/30/2020 1843   LEUKOCYTESUR NEGATIVE 12/30/2020 1843   Sepsis Labs: @LABRCNTIP (procalcitonin:4,lacticidven:4)  ) Recent  Results (from the past 240 hour(s))  Resp Panel by RT-PCR (Flu A&B, Covid) Nasopharyngeal Swab     Status: None   Collection Time: 12/29/20 10:12 AM   Specimen: Nasopharyngeal Swab; Nasopharyngeal(NP) swabs in vial transport medium  Result Value Ref Range Status   SARS Coronavirus 2 by RT PCR NEGATIVE NEGATIVE Final    Comment: (NOTE) SARS-CoV-2 target nucleic acids are NOT DETECTED.  The SARS-CoV-2 RNA is generally detectable in upper respiratory specimens during the acute phase of infection. The lowest concentration of SARS-CoV-2 viral copies this assay can detect is 138 copies/mL. A negative result does not preclude SARS-Cov-2 infection and should not be used as the sole basis for treatment or other  patient management decisions. A negative result may occur with  improper specimen collection/handling, submission of specimen other than nasopharyngeal swab, presence of viral mutation(s) within the areas targeted by this assay, and inadequate number of viral copies(<138 copies/mL). A negative result must be combined with clinical observations, patient history, and epidemiological information. The expected result is Negative.  Fact Sheet for Patients:  EntrepreneurPulse.com.au  Fact Sheet for Healthcare Providers:  IncredibleEmployment.be  This test is no t yet approved or cleared by the [place] FDA and  has been authorized for detection and/or diagnosis of SARS-CoV-2 by FDA under an Emergency Use Authorization (EUA). This EUA will remain  in effect (meaning this test can be used) for the duration of the COVID-19 declaration under Section 564(b)(1) of the Act, 21 U.S.C.section 360bbb-3(b)(1), unless the authorization is terminated  or revoked sooner.       Influenza A by PCR NEGATIVE NEGATIVE Final   Influenza B by PCR NEGATIVE NEGATIVE Final    Comment: (NOTE) The Xpert Xpress SARS-CoV-2/FLU/RSV plus assay is intended as an aid in the diagnosis of influenza from Nasopharyngeal swab specimens and should not be used as a sole basis for treatment. Nasal washings and aspirates are unacceptable for Xpert Xpress SARS-CoV-2/FLU/RSV testing.  Fact Sheet for Patients: EntrepreneurPulse.com.au  Fact Sheet for Healthcare Providers: IncredibleEmployment.be  This test is not yet approved or cleared by the [place] FDA and has been authorized for detection and/or diagnosis of SARS-CoV-2 by FDA under an Emergency Use Authorization (EUA). This EUA will remain in effect (meaning this test can be used) for the duration of the COVID-19 declaration under Section 564(b)(1) of the Act, 21 U.S.C. section  360bbb-3(b)(1), unless the authorization is terminated or revoked.  Performed at [facility], [address] [place], [place] [address]   Culture, blood (routine x 2)     Status: Abnormal (Preliminary result)   Collection Time: 12/29/20 10:38 AM   Specimen: BLOOD RIGHT HAND  Result Value Ref Range Status   Specimen Description   Final    BLOOD RIGHT HAND BOTTLES DRAWN AEROBIC AND ANAEROBIC Performed at [facility], [address], [place], [place] [address]    Special Requests   Final    Blood Culture adequate volume Performed at [place]., [place], [place] [address]    Culture  Setup Time   Final    GRAM POSITIVE COCCI IN BOTH AEROBIC AND ANAEROBIC BOTTLES Gram Stain Report Called to,Read Back By and Verified With: SIMPSON K. AT [facility] AT 2245 ON [id number] BY THOMPSON S. CRITICAL RESULT CALLED TO, READ BACK BY AND VERIFIED WITH: M LILLISTON,PHARMD@0450  12/30/20 [place]    Culture (A)  Final    STAPHYLOCOCCUS AUREUS SUSCEPTIBILITIES TO FOLLOW CULTURE REINCUBATED FOR BETTER GROWTH Performed at [facility], [address] [place], [place] [address]    Report Status PENDING  Incomplete  Culture, blood (routine x 2)     Status: None (Preliminary result)   Collection Time: 12/29/20 10:38 AM   Specimen: Left Antecubital; Blood  Result Value Ref Range Status   Specimen Description   Final    LEFT ANTECUBITAL BOTTLES DRAWN AEROBIC ONLY Performed at [facility], [address] [place], [place] [address]    Special Requests   Final    Blood Culture adequate volume Performed at [facility], [address], [place], [place] [address]    Culture  Setup Time   Final    GRAM POSITIVE COCCI AEROBIC BOTTLE ONLY CRITICAL VALUE NOTED.  VALUE IS CONSISTENT WITH PREVIOUSLY REPORTED AND CALLED VALUE.    Culture   Final    GRAM POSITIVE COCCI TOO YOUNG TO READ Performed at [facility], [place] [address] [place], [place] [address]    Report Status PENDING  Incomplete   Blood Culture ID Panel (Reflexed)     Status: Abnormal   Collection Time: 12/29/20 10:38 AM  Result Value Ref Range Status   Enterococcus faecalis NOT DETECTED NOT DETECTED Final   Enterococcus Faecium NOT DETECTED NOT DETECTED Final   Listeria monocytogenes NOT DETECTED NOT DETECTED Final   Staphylococcus species DETECTED (A) NOT DETECTED Final    Comment: CRITICAL RESULT CALLED TO, READ BACK BY AND VERIFIED WITH: M LILLISTON,PHARMD@0450  12/30/20 [place]    Staphylococcus aureus (BCID) DETECTED (A) NOT DETECTED Final    Comment: CRITICAL RESULT CALLED TO, READ BACK BY AND VERIFIED WITH: M LILLISTON,PHARMD@0450  12/30/20 [place]    Staphylococcus epidermidis DETECTED (A) NOT DETECTED Final    Comment: CRITICAL RESULT CALLED TO, READ BACK BY AND VERIFIED WITH: M LILLISTON,PHARMD@0453  12/30/20 [place]    Staphylococcus lugdunensis NOT DETECTED NOT DETECTED Final   Streptococcus species NOT DETECTED NOT DETECTED Final   Streptococcus agalactiae NOT DETECTED NOT DETECTED Final   Streptococcus pneumoniae NOT DETECTED NOT DETECTED Final   Streptococcus pyogenes NOT DETECTED NOT DETECTED Final   A.calcoaceticus-baumannii NOT DETECTED NOT DETECTED Final   Bacteroides fragilis NOT DETECTED NOT DETECTED Final   Enterobacterales NOT DETECTED NOT DETECTED Final   Enterobacter cloacae complex NOT DETECTED NOT DETECTED Final   Escherichia coli NOT DETECTED NOT DETECTED Final   Klebsiella aerogenes NOT DETECTED NOT DETECTED Final   Klebsiella oxytoca NOT DETECTED NOT DETECTED Final   Klebsiella pneumoniae NOT DETECTED NOT DETECTED Final   Proteus species NOT DETECTED NOT DETECTED Final   Salmonella species NOT DETECTED NOT DETECTED Final   Serratia marcescens NOT DETECTED NOT DETECTED Final   Haemophilus influenzae NOT DETECTED NOT DETECTED Final   Neisseria meningitidis NOT DETECTED NOT DETECTED Final   Pseudomonas aeruginosa NOT DETECTED NOT DETECTED Final   Stenotrophomonas maltophilia NOT DETECTED NOT  DETECTED Final   Candida albicans NOT DETECTED NOT DETECTED Final   Candida auris NOT DETECTED NOT DETECTED Final   Candida glabrata NOT DETECTED NOT DETECTED Final   Candida krusei NOT DETECTED NOT DETECTED Final   Candida parapsilosis NOT DETECTED NOT DETECTED Final   Candida tropicalis NOT DETECTED NOT DETECTED Final   Cryptococcus neoformans/gattii NOT DETECTED NOT DETECTED Final   Methicillin resistance mecA/C DETECTED (A) NOT DETECTED Final    Comment: CRITICAL RESULT CALLED TO, READ BACK BY AND VERIFIED WITH: M LILLISTON,PHARMD@0453  12/30/20 [place]    Meth resistant mecA/C and MREJ NOT DETECTED NOT DETECTED Final    Comment: Performed at  [facility], [place] [address] [place], [place] [address]  MRSA Next Gen by PCR, Nasal     Status: None   Collection Time: 12/29/20 11:04 PM   Specimen: Nasal Mucosa; Nasal Swab  Result Value Ref Range Status   MRSA by PCR Next Gen NOT DETECTED NOT DETECTED Final    Comment: (NOTE) The GeneXpert MRSA Assay (FDA approved for NASAL specimens only), is one component of a comprehensive MRSA colonization surveillance program. It is not intended to diagnose MRSA infection nor to guide or monitor treatment for MRSA infections. Test performance is not FDA approved in patients less than 59 years old. Performed at [facility], [place] [address] [place], [place] [address]   Culture, blood (routine x 2)     Status: None (Preliminary result)   Collection Time: 12/30/20  3:34 PM   Specimen: BLOOD RIGHT HAND  Result Value Ref Range Status   Specimen Description   Final    BLOOD RIGHT HAND Performed at [place] [address] [place], [place] [address]    Special Requests   Final    BOTTLES DRAWN AEROBIC ONLY Blood Culture results may not be optimal due to an inadequate volume of blood received in culture bottles Performed at [place] [address] [place], [place] [address]     Culture   Final    NO GROWTH < 12 HOURS Performed at [place] [address], [place], [place] [address]    Report Status PENDING  Incomplete  Culture, blood (routine x 2)     Status: None (Preliminary result)   Collection Time: 12/30/20  3:34 PM   Specimen: BLOOD  Result Value Ref Range Status   Specimen Description   Final    BLOOD LEFT ANTECUBITAL Performed at [place] [address] [place], [place] [address]    Special Requests   Final    BOTTLES DRAWN AEROBIC ONLY Blood Culture results may not be optimal due to an inadequate volume of blood received in culture bottles Performed at [place] [address], [place], [place] [address]    Culture   Final    NO GROWTH < 12 HOURS Performed at [place] [address] [place], [place] [address]    Report Status PENDING  Incomplete  Expectorated Sputum Assessment w Gram Stain, Rflx to Resp Cult     Status: None   Collection Time: 12/31/20  8:35 AM   Specimen: Expectorated Sputum  Result Value Ref Range Status   Specimen Description EXPECTORATED SPUTUM  Final   Special Requests NONE  Final   Sputum evaluation   Final    THIS SPECIMEN IS ACCEPTABLE FOR SPUTUM CULTURE Performed at [facility], [place] [address] [place], [place] [address]    Report Status 12/31/2020 FINAL  Final  Culture, Respiratory w Gram Stain     Status: None (Preliminary result)   Collection Time: 12/31/20  8:35 AM  Result Value Ref Range Status   Specimen Description   Final    EXPECTORATED SPUTUM Performed at [place] [address], [place], [place] [address]    Special Requests   Final    NONE Reflexed from [id number] Performed at [facility], [place] [address], [place], [place] [address]    Gram Stain   Final    RARE SQUAMOUS EPITHELIAL CELLS PRESENT RARE WBC PRESENT,BOTH PMN AND  MONONUCLEAR NO ORGANISMS SEEN Performed at  [facility], [place] [address] [place], [place] [address]    Culture PENDING  Incomplete   Report Status PENDING  Incomplete      Studies: No results found.  Scheduled Meds:  apixaban  5 mg Oral BID   azithromycin  500 mg Oral Daily   [START ON 01/01/2021] cefdinir  300 mg Oral Q12H   Chlorhexidine Gluconate Cloth  6 each Topical Daily   insulin aspart  0-5 Units Subcutaneous QHS   insulin aspart  0-9 Units Subcutaneous TID WC   ipratropium-albuterol  3 mL Nebulization BID   levothyroxine  100 mcg Oral QAC breakfast   methylPREDNISolone (SOLU-MEDROL) injection  40 mg Intravenous Q12H   metoprolol tartrate  25 mg Oral BID   miconazole   Topical BID   pantoprazole  40 mg Oral Daily   sodium chloride flush  3 mL Intravenous Q12H    Continuous Infusions:  sodium chloride Stopped (12/31/20 1202)   [START ON 01/01/2021] vancomycin       LOS: 2 days     Alma Friendly, MD [facility]  If 7PM-7AM, please contact night-coverage [email or web address] 12/31/2020, 3:40 PM

## 2020-12-31 NOTE — Progress Notes (Signed)
ANTICOAGULATION CONSULT NOTE - Initial Consult  Pharmacy Consult for Heparin Indication: atrial fibrillation (bridge therapy while apixaban on hold for procedure)  No Known Allergies  Patient Measurements: Height: 5\' 5"  (165.1 cm) Weight: 69.3 kg (152 lb 12.5 oz) IBW/kg (Calculated) : 61.5 Heparin Dosing Weight: actual body weight  Vital Signs: Temp: 98 F (36.7 C) (11/14 1420) Temp Source: Oral (11/14 1420) BP: 125/71 (11/14 1420) Pulse Rate: 66 (11/14 1420)  Labs: Recent Labs    12/29/20 1011 12/29/20 1207 12/30/20 0746 12/31/20 0530  HGB 9.0*  --  7.3* 7.2*  HCT 27.4*  --  23.2* 22.7*  PLT 230  --  198 192  CREATININE 1.44*  --  0.98 0.94  TROPONINIHS 9 8  --   --     Estimated Creatinine Clearance: 62.7 mL/min (by C-G formula based on SCr of 0.94 mg/dL).   Medical History: Past Medical History:  Diagnosis Date   Anxiety    Arthritis    Dysrhythmia    Hyperlipemia    Hypertension    Hyperthyroidism    Lung cancer ([place])    Myocardial infarction ([place])    Paroxysmal atrial fibrillation ([place])    Port-A-Cath in place 11/14/2019   Tachycardia     Medications:  PTA apixaban 5mg  BID - LD 12/31/20 @ 10:56  Assessment: 70 yr male admitted 12/29/20 for acute respiratory failure with hypoxia and septic shock (found to have staph aureus bacteremia) PMH significant for AFib with RVR, metastatic lung cancer Plan for port-a-cath removal on 11/15; apixaban has been d/c'ed Pharmacy consulted to dose IV heparin as bridge therapy while apixaban held pending 11/15 procedure Surgery note states IV heparin needs to be held 6 hours pre-op  Goal of Therapy:  aPTT 66-102 seconds Monitor platelets by anticoagulation protocol: Yes   Plan:  At 23:00 begin IV heparin gtt (no bolus) @ 1000 units/hr As heparin to be d/c'ed 8 hr after it is started, will not order aPTT.  If time of surgery is delayed, will order needed aPTT. F/U after procedure for resuming  anticoagulation  Garris Melhorn, Toribio [place]bour, PharmD 12/31/2020,4:40 PM

## 2020-12-31 NOTE — Progress Notes (Signed)
[facility]  Date of Admission:  12/29/2020     Lines:  10/2019 right chest port   Abx: 11/13-c vanc 11/12-c ceftriaxone --> cefdinir 11/12-c azith oral on 11/14                                                        Assessment: 70 y.o. male former smoker, left lung NSC cancer s/p chemoradiation transitioned to second line chemotherapy in setting of disease progression (last cycle docetaxel and ramucirumab 12/19/2020), admitted in transfer from [place] on 11/12 for acute hypoxic respiratory failure with afib/rvr and septic shock found to have staph aureus bacteremia     #staph aureus bacteremia Sx of at least a week; nonspecific but in a chemotherapy patient 11/12 bcx 1 of 2 set staph epi -- per bcid likely mrse; also staph aureus. Bcid result at this time only -- would keep on vanc and wait for culture Patient does have a right chest port that was placed 10/2019. This will need to be removed Cxr suggestive new left lung opacity ?cancer vs pna/pneumonitis vs septic emboli.  11/13 tte negative   -please arrange TEE -patient needs port removed -f/u repeat bcx for clearance and susceptibility -continue vanc    #hypoxic respiratory failure sepsis vs staph aureus involvement vs diastolic dysfunction vs concurrent bacterial pna vs metastatic lung disease  multifactorial a consideration  -f/u sputum cx; urine strep/legionella ag -finish 5 days CAP coverage; transitioned iv ctrx/azith to oral cefdinir/azith -- to finish on 11/17     #metastatic lung cancer squamous cell -last chemo cycle 11/02 -repeat brain mri 10/2020 no new lesion; improved edematous change -last chest ct 11/2020 showed increased size left lung cancerous lesion      Principal Problem:   Acute respiratory failure with hypoxia (HCC) Active Problems:   Paroxysmal atrial fibrillation with RVR (HCC)   Lung cancer (HCC)   AKI (acute kidney injury) ([place])   Anemia  associated with chemotherapy   Hypotension   Staphylococcus aureus bacteremia   Malignancy (HCC)   No Known Allergies  Scheduled Meds:  apixaban  5 mg Oral BID   azithromycin  500 mg Oral Daily   [START ON 01/01/2021] cefdinir  300 mg Oral Q12H   Chlorhexidine Gluconate Cloth  6 each Topical Daily   insulin aspart  0-5 Units Subcutaneous QHS   insulin aspart  0-9 Units Subcutaneous TID WC   ipratropium-albuterol  3 mL Nebulization BID   levothyroxine  100 mcg Oral QAC breakfast   methylPREDNISolone (SOLU-MEDROL) injection  40 mg Intravenous Q12H   metoprolol tartrate  25 mg Oral BID   miconazole   Topical BID   pantoprazole  40 mg Oral Daily   sodium chloride flush  3 mL Intravenous Q12H   Continuous Infusions:  sodium chloride 10 mL/hr at 12/31/20 0400   [START ON 01/01/2021] vancomycin     PRN Meds:.sodium chloride, acetaminophen **OR** acetaminophen, albuterol, ALPRAZolam, alum & mag hydroxide-simeth, dextromethorphan, ondansetron **OR** ondansetron (ZOFRAN) IV   SUBJECTIVE: Continue to feel well Port remains in Stable minimal o2 requirement No fever, n/v/diarrhea/rash  Review of Systems: ROS All other ROS was negative, except mentioned above     OBJECTIVE: Vitals:   12/31/20 0300 12/31/20 0400 12/31/20 0842 12/31/20 0850  BP: (!) 112/54 (!) 116/57    Pulse: 72 71    Resp:      Temp:  97.6 F (36.4 C) 97.7 F (36.5 C)   TempSrc:  Axillary Oral   SpO2: 100% 100%  100%  Weight:      Height:       Body mass index is 27.4 kg/m.  Physical Exam  General/constitutional: no distress, pleasant HEENT: Normocephalic, PER, Conj Clear, EOMI, Oropharynx clear Neck supple CV: rrr no mrg Lungs: clear to auscultation, normal respiratory effort Abd: Soft, Nontender Ext: no edema Skin: No Rash Neuro: nonfocal MSK: no peripheral joint swelling/tenderness/warmth; back spines nontender   Central line presence: right chest port no erythema/purulence  Lab  Results Lab Results  Component Value Date   WBC 10.2 12/31/2020   HGB 7.2 (L) 12/31/2020   HCT 22.7 (L) 12/31/2020   MCV 97.0 12/31/2020   PLT 192 12/31/2020    Lab Results  Component Value Date   CREATININE 0.94 12/31/2020   BUN 24 (H) 12/31/2020   NA 134 (L) 12/31/2020   K 4.0 12/31/2020   CL 105 12/31/2020   CO2 23 12/31/2020    Lab Results  Component Value Date   ALT 21 12/29/2020   AST 24 12/29/2020   ALKPHOS 53 12/29/2020   BILITOT 1.1 12/29/2020      Microbiology: Recent Results (from the past 240 hour(s))  Resp Panel by RT-PCR (Flu A&B, Covid) Nasopharyngeal Swab     Status: None   Collection Time: 12/29/20 10:12 AM   Specimen: Nasopharyngeal Swab; Nasopharyngeal(NP) swabs in vial transport medium  Result Value Ref Range Status   SARS Coronavirus 2 by RT PCR NEGATIVE NEGATIVE Final    Comment: (NOTE) SARS-CoV-2 target nucleic acids are NOT DETECTED.  The SARS-CoV-2 RNA is generally detectable in upper respiratory specimens during the acute phase of infection. The lowest concentration of SARS-CoV-2 viral copies this assay can detect is 138 copies/mL. A negative result does not preclude SARS-Cov-2 infection and should not be used as the sole basis for treatment or other patient management decisions. A negative result may occur with  improper specimen collection/handling, submission of specimen other than nasopharyngeal swab, presence of viral mutation(s) within the areas targeted by this assay, and inadequate number of viral copies(<138 copies/mL). A negative result must be combined with clinical observations, patient history, and epidemiological information. The expected result is Negative.  Fact Sheet for Patients:  EntrepreneurPulse.com.au  Fact Sheet for Healthcare Providers:  IncredibleEmployment.be  This test is no t yet approved or cleared by the [place] FDA and  has been authorized for detection and/or  diagnosis of SARS-CoV-2 by FDA under an Emergency Use Authorization (EUA). This EUA will remain  in effect (meaning this test can be used) for the duration of the COVID-19 declaration under Section 564(b)(1) of the Act, 21 U.S.C.section 360bbb-3(b)(1), unless the authorization is terminated  or revoked sooner.       Influenza A by PCR NEGATIVE NEGATIVE Final   Influenza B by PCR NEGATIVE NEGATIVE Final    Comment: (NOTE) The Xpert Xpress SARS-CoV-2/FLU/RSV plus assay is intended as an aid in the diagnosis of influenza from Nasopharyngeal swab specimens and should not be used as a sole basis for treatment. Nasal washings and aspirates are unacceptable for Xpert Xpress SARS-CoV-2/FLU/RSV testing.  Fact Sheet for Patients: EntrepreneurPulse.com.au  Fact Sheet for Healthcare Providers: IncredibleEmployment.be  This test is not yet approved or cleared by the [place] FDA and has been  authorized for detection and/or diagnosis of SARS-CoV-2 by FDA under an Emergency Use Authorization (EUA). This EUA will remain in effect (meaning this test can be used) for the duration of the COVID-19 declaration under Section 564(b)(1) of the Act, 21 U.S.C. section 360bbb-3(b)(1), unless the authorization is terminated or revoked.  Performed at [facility], [address] [place], [place] [address]   Culture, blood (routine x 2)     Status: Abnormal (Preliminary result)   Collection Time: 12/29/20 10:38 AM   Specimen: BLOOD RIGHT HAND  Result Value Ref Range Status   Specimen Description   Final    BLOOD RIGHT HAND BOTTLES DRAWN AEROBIC AND ANAEROBIC Performed at [facility], [address] [place], [place] [address]    Special Requests   Final    Blood Culture adequate volume Performed at [facility] [place], [place] [address]    Culture  Setup Time   Final    GRAM POSITIVE COCCI IN BOTH AEROBIC AND ANAEROBIC BOTTLES Gram Stain  Report Called to,Read Back By and Verified With: SIMPSON K. AT [facility] AT 2245 ON [id number] BY THOMPSON S. CRITICAL RESULT CALLED TO, READ BACK BY AND VERIFIED WITH: M LILLISTON,PHARMD@0450  12/30/20 [place]    Culture (A)  Final    STAPHYLOCOCCUS AUREUS SUSCEPTIBILITIES TO FOLLOW CULTURE REINCUBATED FOR BETTER GROWTH Performed at [facility], [place] [address] [place], [place] [address]    Report Status PENDING  Incomplete  Culture, blood (routine x 2)     Status: None (Preliminary result)   Collection Time: 12/29/20 10:38 AM   Specimen: Left Antecubital; Blood  Result Value Ref Range Status   Specimen Description   Final    LEFT ANTECUBITAL BOTTLES DRAWN AEROBIC ONLY Performed at [facility], [address], [place], [place] [address]    Special Requests   Final    Blood Culture adequate volume Performed at [name], [address], [place], [place] [address]    Culture  Setup Time   Final    GRAM POSITIVE COCCI AEROBIC BOTTLE ONLY CRITICAL VALUE NOTED.  VALUE IS CONSISTENT WITH PREVIOUSLY REPORTED AND CALLED VALUE.    Culture   Final    GRAM POSITIVE COCCI TOO YOUNG TO READ Performed at [facility], [place] [address] [place], [place] [address]    Report Status PENDING  Incomplete  Blood Culture ID Panel (Reflexed)     Status: Abnormal   Collection Time: 12/29/20 10:38 AM  Result Value Ref Range Status   Enterococcus faecalis NOT DETECTED NOT DETECTED Final   Enterococcus Faecium NOT DETECTED NOT DETECTED Final   Listeria monocytogenes NOT DETECTED NOT DETECTED Final   Staphylococcus species DETECTED (A) NOT DETECTED Final    Comment: CRITICAL RESULT CALLED TO, READ BACK BY AND VERIFIED WITH: M LILLISTON,PHARMD@0450  12/30/20 [place]    Staphylococcus aureus (BCID) DETECTED (A) NOT DETECTED Final    Comment: CRITICAL RESULT CALLED TO, READ BACK BY AND VERIFIED WITH: M LILLISTON,PHARMD@0450  12/30/20 [place]    Staphylococcus epidermidis DETECTED (A) NOT DETECTED Final    Comment:  CRITICAL RESULT CALLED TO, READ BACK BY AND VERIFIED WITH: M LILLISTON,PHARMD@0453  12/30/20 [place]    Staphylococcus lugdunensis NOT DETECTED NOT DETECTED Final   Streptococcus species NOT DETECTED NOT DETECTED Final   Streptococcus agalactiae NOT DETECTED NOT DETECTED Final   Streptococcus pneumoniae NOT DETECTED NOT DETECTED Final   Streptococcus pyogenes NOT DETECTED NOT DETECTED Final   A.calcoaceticus-baumannii NOT DETECTED NOT DETECTED Final  Bacteroides fragilis NOT DETECTED NOT DETECTED Final   Enterobacterales NOT DETECTED NOT DETECTED Final   Enterobacter cloacae complex NOT DETECTED NOT DETECTED Final   Escherichia coli NOT DETECTED NOT DETECTED Final   Klebsiella aerogenes NOT DETECTED NOT DETECTED Final   Klebsiella oxytoca NOT DETECTED NOT DETECTED Final   Klebsiella pneumoniae NOT DETECTED NOT DETECTED Final   Proteus species NOT DETECTED NOT DETECTED Final   Salmonella species NOT DETECTED NOT DETECTED Final   Serratia marcescens NOT DETECTED NOT DETECTED Final   Haemophilus influenzae NOT DETECTED NOT DETECTED Final   Neisseria meningitidis NOT DETECTED NOT DETECTED Final   Pseudomonas aeruginosa NOT DETECTED NOT DETECTED Final   Stenotrophomonas maltophilia NOT DETECTED NOT DETECTED Final   Candida albicans NOT DETECTED NOT DETECTED Final   Candida auris NOT DETECTED NOT DETECTED Final   Candida glabrata NOT DETECTED NOT DETECTED Final   Candida krusei NOT DETECTED NOT DETECTED Final   Candida parapsilosis NOT DETECTED NOT DETECTED Final   Candida tropicalis NOT DETECTED NOT DETECTED Final   Cryptococcus neoformans/gattii NOT DETECTED NOT DETECTED Final   Methicillin resistance mecA/C DETECTED (A) NOT DETECTED Final    Comment: CRITICAL RESULT CALLED TO, READ BACK BY AND VERIFIED WITH: M LILLISTON,PHARMD@0453  12/30/20 [place]    Meth resistant mecA/C and MREJ NOT DETECTED NOT DETECTED Final    Comment: Performed at [facility], [address], [place], [place]  [address]  MRSA Next Gen by PCR, Nasal     Status: None   Collection Time: 12/29/20 11:04 PM   Specimen: Nasal Mucosa; Nasal Swab  Result Value Ref Range Status   MRSA by PCR Next Gen NOT DETECTED NOT DETECTED Final    Comment: (NOTE) The GeneXpert MRSA Assay (FDA approved for NASAL specimens only), is one component of a comprehensive MRSA colonization surveillance program. It is not intended to diagnose MRSA infection nor to guide or monitor treatment for MRSA infections. Test performance is not FDA approved in patients less than 47 years old. Performed at [facility], [place] [address] [place], [place] [address]   Culture, blood (routine x 2)     Status: None (Preliminary result)   Collection Time: 12/30/20  3:34 PM   Specimen: BLOOD RIGHT HAND  Result Value Ref Range Status   Specimen Description   Final    BLOOD RIGHT HAND Performed at [facility] [address], [place], [place] [address]    Special Requests   Final    BOTTLES DRAWN AEROBIC ONLY Blood Culture results may not be optimal due to an inadequate volume of blood received in culture bottles Performed at [facility] [address], [place], [place] [address]    Culture   Final    NO GROWTH < 12 HOURS Performed at [facility] [address], [place], [place] [address]    Report Status PENDING  Incomplete  Culture, blood (routine x 2)     Status: None (Preliminary result)   Collection Time: 12/30/20  3:34 PM   Specimen: BLOOD  Result Value Ref Range Status   Specimen Description   Final    BLOOD LEFT ANTECUBITAL Performed at [facility] [address] [place], [place] [address]    Special Requests   Final    BOTTLES DRAWN AEROBIC ONLY Blood Culture results may not be optimal due to an inadequate volume of blood received in culture bottles Performed at [facility], [address] [place], [place] [address]     Culture   Final    NO GROWTH < 12 HOURS Performed at [facility] [address], [place], [place] [address]    Report Status PENDING  Incomplete  Expectorated Sputum Assessment w Gram Stain, Rflx to Resp Cult     Status: None   Collection Time: 12/31/20  8:35 AM   Specimen: Expectorated Sputum  Result Value Ref Range Status   Specimen Description EXPECTORATED SPUTUM  Final   Special Requests NONE  Final   Sputum evaluation   Final    THIS SPECIMEN IS ACCEPTABLE FOR SPUTUM CULTURE Performed at [facility], [place] [address], [place],  [address]    Report Status 12/31/2020 FINAL  Final     Serology:   Imaging: If present, new imagings (plain films, ct scans, and mri) have been personally visualized and interpreted; radiology reports have been reviewed. Decision making incorporated into the Impression / Recommendations.  11/13 tte No evidence of valvular vegetations on  this transthoracic echocardiogram. Consider a transesophageal  echocardiogram to exclude infective endocarditis if clinically indicated  Jabier Mutton, [place] [facility] [place] [phone] pager    12/31/2020, 12:06 PM

## 2020-12-31 NOTE — Evaluation (Signed)
Occupational Therapy Evaluation Patient Details Name: Alan [name] MRN: [id number] DOB: [birth year] Today's Date: 12/31/2020   History of Present Illness Patient is a 70 year old male who presented to the hosptial on 12/29/20 with shortness of breath and chronic cough. patietn was found to have acute respiratory failure with hypoxia, sepsis, a fib with RVR, AKI and bacteremia. PMH: metastatic lung cancer on chemo, HTN, A fib, and anxiety.   Clinical Impression   Patient is a 70 year old male who was admitted for above. Patient was living at home with wife support prior level. Currently, patient needs min guard for activities with HR reaching 115bpm with functional mobility. Patient was able to maintain O2 saturation on 2L/min supplemental O2 during session. Patient would continue to benefit from skilled OT services at this time while admitted to address noted deficits in order to improve overall safety and independence in ADLs.        Recommendations for follow up therapy are one component of a multi-disciplinary discharge planning process, led by the attending physician.  Recommendations may be updated based on patient status, additional functional criteria and insurance authorization.   Follow Up Recommendations  No OT follow up    Assistance Recommended at Discharge Intermittent Supervision/Assistance  Functional Status Assessment  Patient has had a recent decline in their functional status and demonstrates the ability to make significant improvements in function in a reasonable and predictable amount of time.  Equipment Recommendations  None recommended by OT    Recommendations for Other Services       Precautions / Restrictions Precautions Precautions: Fall Precaution Comments: monitor O2 and HR Restrictions Weight Bearing Restrictions: No      Mobility Bed Mobility Overal bed mobility: Needs Assistance Bed Mobility: Supine to Sit     Supine to sit: Min guard           Transfers Overall transfer level: Needs assistance Equipment used: Rolling walker (2 wheels) Transfers: Sit to/from Stand Sit to Stand: Min guard                  Balance Overall balance assessment: Mild deficits observed, not formally tested                                         ADL either performed or assessed with clinical judgement   ADL Overall ADL's : Needs assistance/impaired Eating/Feeding: Set up;Sitting   Grooming: Set up;Sitting   Upper Body Bathing: Min guard;Standing   Lower Body Bathing: Minimal assistance;Sit to/from stand   Upper Body Dressing : Sitting;Set up   Lower Body Dressing: Sit to/from stand;Minimal assistance   Toilet Transfer: Min guard;Rolling walker (2 wheels);Ambulation   Toileting- Clothing Manipulation and Hygiene: Min guard;Sit to/from stand       Functional mobility during ADLs: Minimal assistance;Rolling walker (2 wheels);Cueing for safety       Vision Patient Visual Report: No change from baseline       Perception     Praxis      Pertinent Vitals/Pain Pain Assessment: No/denies pain     Hand Dominance Right   Extremity/Trunk Assessment Upper Extremity Assessment Upper Extremity Assessment: Overall WFL for tasks assessed   Lower Extremity Assessment Lower Extremity Assessment: Defer to PT evaluation   Cervical / Trunk Assessment Cervical / Trunk Assessment: Normal   Communication Communication Communication: No difficulties   Cognition Arousal/Alertness: Awake/alert Behavior  During Therapy: WFL for tasks assessed/performed;Flat affect Overall Cognitive Status: Within Functional Limits for tasks assessed                                       General Comments       Exercises     Shoulder Instructions      Home Living Family/patient expects to be discharged to:: Private residence Living Arrangements: Spouse/significant other Available Help at Discharge:  Family;Available 24 hours/day Type of Home: House Home Access: Stairs to enter [employer] of Steps: 3         Bathroom Shower/Tub: Teacher, early years/pre: Standard     Home Equipment: Conservation officer, nature (2 wheels)          Prior Functioning/Environment Prior Level of Function : Needs assist               ADLs Comments: patient reported being independent on good days and needing some help from wife on bad days.        OT Problem List: Impaired balance (sitting and/or standing);Decreased activity tolerance;Decreased safety awareness;Decreased knowledge of precautions;Decreased knowledge of use of DME or AE      OT Treatment/Interventions: Self-care/ADL training;Therapeutic exercise;Neuromuscular education;DME and/or AE instruction;Energy conservation;Therapeutic activities;Patient/family education;Balance training    OT Goals(Current goals can be found in the care plan section) Acute Rehab OT Goals Patient Stated Goal: none stated OT Goal Formulation: With patient Time For Goal Achievement: 01/14/21 Potential to Achieve Goals: Good  OT Frequency: Min 2X/week   Barriers to D/C:            Co-evaluation PT/OT/SLP Co-Evaluation/Treatment: Yes Reason for Co-Treatment: To address functional/ADL transfers PT goals addressed during session: Mobility/safety with mobility OT goals addressed during session: ADL's and self-care      AM-PAC OT "6 Clicks" Daily Activity     Outcome Measure Help from another person eating meals?: None Help from another person taking care of personal grooming?: None Help from another person toileting, which includes using toliet, bedpan, or urinal?: A Little Help from another person bathing (including washing, rinsing, drying)?: A Little Help from another person to put on and taking off regular upper body clothing?: A Little Help from another person to put on and taking off regular lower body clothing?: A Little 6  Click Score: 20   End of Session Equipment Utilized During Treatment: Gait belt;Rolling walker (2 wheels) Nurse Communication: Mobility status  Activity Tolerance: Patient tolerated treatment well Patient left: in chair;with call bell/phone within reach  OT Visit Diagnosis: Unsteadiness on feet (R26.81)                Time: 1212-1228 OT Time Calculation (min): 16 min Charges:  OT General Charges $OT Visit: 1 Visit OT Evaluation $OT Eval Low Complexity: 1 Low  Jackelyn Poling OTR/L, MS Acute Rehabilitation Department Office# [phone] Pager# [phone]   [place] 12/31/2020, 12:39 PM

## 2021-01-01 ENCOUNTER — Encounter (HOSPITAL_COMMUNITY): Payer: Medicare Other

## 2021-01-01 ENCOUNTER — Inpatient Hospital Stay (HOSPITAL_COMMUNITY): Payer: Medicare Other | Admitting: Certified Registered Nurse Anesthetist

## 2021-01-01 ENCOUNTER — Encounter (HOSPITAL_COMMUNITY)
Admission: EM | Disposition: A | Discharge status: Home / Self Care | Payer: Self-pay | Source: Home / Self Care | Attending: Internal Medicine

## 2021-01-01 ENCOUNTER — Encounter (HOSPITAL_COMMUNITY): Payer: Self-pay | Admitting: Internal Medicine

## 2021-01-01 DIAGNOSIS — R7881 Bacteremia: Secondary | ICD-10-CM | POA: Diagnosis not present

## 2021-01-01 DIAGNOSIS — T80211D Bloodstream infection due to central venous catheter, subsequent encounter: Secondary | ICD-10-CM

## 2021-01-01 DIAGNOSIS — Z20822 Contact with and (suspected) exposure to covid-19: Secondary | ICD-10-CM | POA: Diagnosis not present

## 2021-01-01 DIAGNOSIS — J189 Pneumonia, unspecified organism: Secondary | ICD-10-CM | POA: Diagnosis not present

## 2021-01-01 DIAGNOSIS — T80211A Bloodstream infection due to central venous catheter, initial encounter: Secondary | ICD-10-CM

## 2021-01-01 DIAGNOSIS — A4101 Sepsis due to Methicillin susceptible Staphylococcus aureus: Secondary | ICD-10-CM | POA: Diagnosis not present

## 2021-01-01 DIAGNOSIS — J9601 Acute respiratory failure with hypoxia: Secondary | ICD-10-CM | POA: Diagnosis not present

## 2021-01-01 DIAGNOSIS — B9561 Methicillin susceptible Staphylococcus aureus infection as the cause of diseases classified elsewhere: Secondary | ICD-10-CM | POA: Diagnosis not present

## 2021-01-01 HISTORY — PX: PORT-A-CATH REMOVAL: SHX5289

## 2021-01-01 LAB — CBC WITH DIFFERENTIAL/PLATELET
Abs Immature Granulocytes: 1.27 10*3/uL — ABNORMAL HIGH (ref 0.00–0.07)
Basophils Absolute: 0 10*3/uL (ref 0.0–0.1)
Basophils Relative: 0 %
Eosinophils Absolute: 0 10*3/uL (ref 0.0–0.5)
Eosinophils Relative: 0 %
HCT: 24.3 % — ABNORMAL LOW (ref 39.0–52.0)
Hemoglobin: 7.4 g/dL — ABNORMAL LOW (ref 13.0–17.0)
Immature Granulocytes: 10 %
Lymphocytes Relative: 4 %
Lymphs Abs: 0.4 10*3/uL — ABNORMAL LOW (ref 0.7–4.0)
MCH: 29.8 pg (ref 26.0–34.0)
MCHC: 30.5 g/dL (ref 30.0–36.0)
MCV: 98 fL (ref 80.0–100.0)
Monocytes Absolute: 0.5 10*3/uL (ref 0.1–1.0)
Monocytes Relative: 4 %
Neutro Abs: 10.2 10*3/uL — ABNORMAL HIGH (ref 1.7–7.7)
Neutrophils Relative %: 82 %
Platelets: 184 10*3/uL (ref 150–400)
RBC: 2.48 MIL/uL — ABNORMAL LOW (ref 4.22–5.81)
RDW: 16.2 % — ABNORMAL HIGH (ref 11.5–15.5)
WBC: 12.4 10*3/uL — ABNORMAL HIGH (ref 4.0–10.5)
nRBC: 2.3 % — ABNORMAL HIGH (ref 0.0–0.2)

## 2021-01-01 LAB — GLUCOSE, CAPILLARY
Glucose-Capillary: 144 mg/dL — ABNORMAL HIGH (ref 70–99)
Glucose-Capillary: 133 mg/dL — ABNORMAL HIGH (ref 70–99)
Glucose-Capillary: 129 mg/dL — ABNORMAL HIGH (ref 70–99)
Glucose-Capillary: 144 mg/dL — ABNORMAL HIGH (ref 70–99)
Glucose-Capillary: 210 mg/dL — ABNORMAL HIGH (ref 70–99)

## 2021-01-01 LAB — BASIC METABOLIC PANEL
Anion gap: 9 (ref 5–15)
BUN: 22 mg/dL (ref 8–23)
CO2: 20 mmol/L — ABNORMAL LOW (ref 22–32)
Calcium: 8.2 mg/dL — ABNORMAL LOW (ref 8.9–10.3)
Chloride: 105 mmol/L (ref 98–111)
Creatinine, Ser: 0.87 mg/dL (ref 0.61–1.24)
GFR, Estimated: >60 mL/min (ref >60)
Glucose, Bld: 212 mg/dL — ABNORMAL HIGH (ref 70–99)
Potassium: 4.6 mmol/L (ref 3.5–5.1)
Sodium: 134 mmol/L — ABNORMAL LOW (ref 135–145)

## 2021-01-01 LAB — APTT: aPTT: 27 seconds (ref 24–36)

## 2021-01-01 LAB — LEGIONELLA PNEUMOPHILA SEROGP 1 UR AG: L. pneumophila Serogp 1 Ur Ag: NEGATIVE

## 2021-01-01 LAB — HEPARIN LEVEL (UNFRACTIONATED): Heparin Unfractionated: >1.1 IU/mL — ABNORMAL HIGH (ref 0.30–0.70)

## 2021-01-01 SURGERY — REMOVAL PORT-A-CATH
Anesthesia: General

## 2021-01-01 MED ORDER — SODIUM CHLORIDE 0.45 % IV SOLN: INTRAVENOUS | Status: DC

## 2021-01-01 MED ORDER — ONDANSETRON HCL 4 MG/2ML IJ SOLN: 4.0000 mg | Freq: Four times a day (QID) | INTRAMUSCULAR | Status: DC | PRN

## 2021-01-01 MED ORDER — MIDAZOLAM HCL 2 MG/2ML IJ SOLN
INTRAMUSCULAR | Status: AC
  Filled 2021-01-01: qty 2

## 2021-01-01 MED ORDER — 0.9 % SODIUM CHLORIDE (POUR BTL) OPTIME
TOPICAL | Status: DC | PRN
  Administered 2021-01-01: 1000 mL

## 2021-01-01 MED ORDER — LIDOCAINE 2% (20 MG/ML) 5 ML SYRINGE
INTRAMUSCULAR | Status: DC | PRN
  Administered 2021-01-01: 40 mg via INTRAVENOUS

## 2021-01-01 MED ORDER — DEXAMETHASONE SODIUM PHOSPHATE 10 MG/ML IJ SOLN
INTRAMUSCULAR | Status: DC | PRN
  Administered 2021-01-01: 6 mg via INTRAVENOUS

## 2021-01-01 MED ORDER — TRAMADOL HCL 50 MG PO TABS: 50.0000 mg | ORAL_TABLET | Freq: Four times a day (QID) | ORAL | Status: DC | PRN

## 2021-01-01 MED ORDER — EPHEDRINE SULFATE-NACL 50-0.9 MG/10ML-% IV SOSY
PREFILLED_SYRINGE | INTRAVENOUS | Status: DC | PRN
  Administered 2021-01-01 (×2): 10 mg via INTRAVENOUS

## 2021-01-01 MED ORDER — ONDANSETRON HCL 4 MG/2ML IJ SOLN
INTRAMUSCULAR | Status: DC | PRN
  Administered 2021-01-01: 4 mg via INTRAVENOUS

## 2021-01-01 MED ORDER — FENTANYL CITRATE (PF) 100 MCG/2ML IJ SOLN
INTRAMUSCULAR | Status: DC | PRN
  Administered 2021-01-01: 25 ug via INTRAVENOUS
  Administered 2021-01-01: 50 ug via INTRAVENOUS

## 2021-01-01 MED ORDER — OXYCODONE HCL 5 MG PO TABS: 5.0000 mg | ORAL_TABLET | ORAL | Status: DC | PRN

## 2021-01-01 MED ORDER — PROPOFOL 10 MG/ML IV BOLUS
INTRAVENOUS | Status: DC | PRN
  Administered 2021-01-01: 100 mg via INTRAVENOUS

## 2021-01-01 MED ORDER — PROPOFOL 10 MG/ML IV BOLUS
INTRAVENOUS | Status: AC
  Filled 2021-01-01: qty 20

## 2021-01-01 MED ORDER — PREDNISONE 20 MG PO TABS
40.0000 mg | ORAL_TABLET | Freq: Every day | ORAL | Status: DC
Start: 2021-01-01 — End: 2021-01-04
  Administered 2021-01-01 – 2021-01-03 (×3): 40 mg via ORAL
  Filled 2021-01-01: qty 4
  Filled 2021-01-01 (×2): qty 2

## 2021-01-01 MED ORDER — BUPIVACAINE HCL (PF) 0.25 % IJ SOLN
INTRAMUSCULAR | Status: AC
  Filled 2021-01-01: qty 30

## 2021-01-01 MED ORDER — HEPARIN (PORCINE) 25000 UT/250ML-% IV SOLN
1150.0000 [IU]/h | INTRAVENOUS | Status: DC
  Administered 2021-01-01: 1000 [IU]/h via INTRAVENOUS
  Filled 2021-01-01: qty 250

## 2021-01-01 MED ORDER — ADULT MULTIVITAMIN W/MINERALS CH
1.0000 | ORAL_TABLET | Freq: Every day | ORAL | Status: DC
  Administered 2021-01-02 – 2021-01-05 (×4): 1 via ORAL
  Filled 2021-01-01 (×5): qty 1

## 2021-01-01 MED ORDER — DEXAMETHASONE SODIUM PHOSPHATE 10 MG/ML IJ SOLN
INTRAMUSCULAR | Status: AC
  Filled 2021-01-01: qty 1

## 2021-01-01 MED ORDER — LACTATED RINGERS IV SOLN: INTRAVENOUS | Status: DC | PRN

## 2021-01-01 MED ORDER — ONDANSETRON 4 MG PO TBDP: 4.0000 mg | ORAL_TABLET | Freq: Four times a day (QID) | ORAL | Status: DC | PRN

## 2021-01-01 MED ORDER — HEPARIN (PORCINE) 25000 UT/250ML-% IV SOLN
1000.0000 [IU]/h | INTRAVENOUS | Status: DC
Start: 2021-01-01 — End: 2021-01-01
  Filled 2021-01-01: qty 250

## 2021-01-01 MED ORDER — MICONAZOLE NITRATE POWD: Freq: Three times a day (TID) | Status: DC

## 2021-01-01 MED ORDER — BUPIVACAINE HCL (PF) 0.25 % IJ SOLN
INTRAMUSCULAR | Status: DC | PRN
  Administered 2021-01-01: 9 mL

## 2021-01-01 MED ORDER — PHENYLEPHRINE 40 MCG/ML (10ML) SYRINGE FOR IV PUSH (FOR BLOOD PRESSURE SUPPORT)
PREFILLED_SYRINGE | INTRAVENOUS | Status: DC | PRN
  Administered 2021-01-01: 200 ug via INTRAVENOUS

## 2021-01-01 MED ORDER — ONDANSETRON HCL 4 MG/2ML IJ SOLN
INTRAMUSCULAR | Status: AC
  Filled 2021-01-01: qty 2

## 2021-01-01 MED ORDER — FENTANYL CITRATE (PF) 100 MCG/2ML IJ SOLN
INTRAMUSCULAR | Status: AC
  Filled 2021-01-01: qty 2

## 2021-01-01 MED ORDER — ENSURE ENLIVE PO LIQD
237.0000 mL | Freq: Three times a day (TID) | ORAL | Status: DC
  Administered 2021-01-01 – 2021-01-04 (×10): 237 mL via ORAL

## 2021-01-01 SURGICAL SUPPLY — 27 items
APL PRP STRL LF DISP 70% ISPRP (MISCELLANEOUS) ×1
BAG COUNTER SPONGE SURGICOUNT (BAG) IMPLANT
BAG SPNG CNTER NS LX DISP (BAG)
BLADE SURG 15 STRL LF DISP TIS (BLADE) ×1 IMPLANT
BLADE SURG 15 STRL SS (BLADE) ×2
CHLORAPREP W/TINT 26 (MISCELLANEOUS) ×2 IMPLANT
COVER SURGICAL LIGHT HANDLE (MISCELLANEOUS) ×2 IMPLANT
DRAPE LAPAROTOMY TRNSV 102X78 (DRAPES) ×2 IMPLANT
GAUZE 4X4 16PLY ~~LOC~~+RFID DBL (SPONGE) ×2 IMPLANT
GAUZE PACKING IODOFORM 1/4X15 (PACKING) ×1 IMPLANT
GAUZE SPONGE 4X4 12PLY STRL (GAUZE/BANDAGES/DRESSINGS) ×1 IMPLANT
GLOVE SURG ORTHO LTX SZ8 (GLOVE) ×2 IMPLANT
GLOVE SURG SYN 7.5  E (GLOVE) ×2
GLOVE SURG SYN 7.5 E (GLOVE) ×1 IMPLANT
GLOVE SURG SYN 7.5 PF PI (GLOVE) ×1 IMPLANT
GLOVE SURG UNDER POLY LF SZ7 (GLOVE) ×2 IMPLANT
GOWN STRL REUS W/TWL LRG LVL3 (GOWN DISPOSABLE) ×2 IMPLANT
GOWN STRL REUS W/TWL XL LVL3 (GOWN DISPOSABLE) ×4 IMPLANT
KIT BASIN OR (CUSTOM PROCEDURE TRAY) ×2 IMPLANT
NDL HYPO 25X1 1.5 SAFETY (NEEDLE) ×1 IMPLANT
NEEDLE HYPO 25X1 1.5 SAFETY (NEEDLE) ×2 IMPLANT
PACK BASIC VI WITH GOWN DISP (CUSTOM PROCEDURE TRAY) ×2 IMPLANT
PENCIL SMOKE EVACUATOR (MISCELLANEOUS) IMPLANT
STRIP CLOSURE SKIN 1/2X4 (GAUZE/BANDAGES/DRESSINGS) ×2 IMPLANT
SUT MNCRL AB 4-0 PS2 18 (SUTURE) ×2 IMPLANT
SYR CONTROL 10ML LL (SYRINGE) ×2 IMPLANT
TOWEL OR 17X26 10 PK STRL BLUE (TOWEL DISPOSABLE) ×6 IMPLANT

## 2021-01-01 NOTE — Progress Notes (Signed)
[facility]  Date of Admission:  12/29/2020     Lines:  10/2019 right chest port   Abx: 11/13-c vanc 11/12-c ceftriaxone --> cefdinir 11/12-c azith oral on 11/14                                                        Assessment: 70 y.o. male former smoker, left lung NSC cancer s/p chemoradiation transitioned to second line chemotherapy in setting of disease progression (last cycle docetaxel and ramucirumab 12/19/2020), admitted in transfer from [place] on 11/12 for acute hypoxic respiratory failure with afib/rvr and septic shock found to have staph aureus bacteremia     #staph aureus bacteremia #staph epi bacteremia Sx of at least a week; nonspecific but in a chemotherapy patient 11/12 bcx positive for both. With the port, suspect both true infectious agent 11/13 bcx repeat ngtd 11/15 had port removed Cxr suggestive new left lung opacity ?cancer vs pna/pneumonitis vs septic emboli.  11/13 tte negative   -await tee -continue vancomycin for now; plan 5 days from 11/15 (for mrse), then switch to cefazolin thereafter to continue mssa bsi treatment -will repeat bcx again tomorrow     #hypoxic respiratory failure sepsis vs staph aureus involvement vs diastolic dysfunction vs concurrent bacterial pna vs metastatic lung disease  multifactorial a consideration  -f/u sputum cx; urine strep/legionella ag -continue cefdinir/azith -- to finish on 11/17     #metastatic lung cancer squamous cell -last chemo cycle 11/02 -repeat brain mri 10/2020 no new lesion; improved edematous change -last chest ct 11/2020 showed increased size left lung cancerous lesion      Principal Problem:   Acute respiratory failure with hypoxia (HCC) Active Problems:   Paroxysmal atrial fibrillation with RVR (HCC)   Lung cancer (HCC)   AKI (acute kidney injury) ([place])   Anemia associated with chemotherapy   Hypotension   Staphylococcus aureus bacteremia    Malignancy (HCC)   No Known Allergies  Scheduled Meds:  azithromycin  500 mg Oral Daily   cefdinir  300 mg Oral Q12H   Chlorhexidine Gluconate Cloth  6 each Topical Daily   feeding supplement  237 mL Oral TID BM   insulin aspart  0-5 Units Subcutaneous QHS   insulin aspart  0-9 Units Subcutaneous TID WC   ipratropium-albuterol  3 mL Nebulization BID   levothyroxine  100 mcg Oral QAC breakfast   metoprolol tartrate  25 mg Oral BID   miconazole   Topical BID   multivitamin with minerals  1 tablet Oral Daily   pantoprazole  40 mg Oral Daily   predniSONE  40 mg Oral Q breakfast   sodium chloride flush  3 mL Intravenous Q12H   Continuous Infusions:  sodium chloride 50 mL/hr at 01/01/21 1456   sodium chloride Stopped (12/31/20 1202)   vancomycin 1,250 mg (01/01/21 5643)   PRN Meds:.sodium chloride, acetaminophen **OR** acetaminophen, albuterol, ALPRAZolam, alum & mag hydroxide-simeth, dextromethorphan, ondansetron **OR** ondansetron (ZOFRAN) IV, ondansetron **OR** ondansetron (ZOFRAN) IV, oxyCODONE, traMADol   SUBJECTIVE: Port removed today No f/c No focal pain Feels well Repeat bcx ngtd  Review of Systems: ROS All other ROS was negative, except mentioned above     OBJECTIVE: Vitals:   01/01/21 1330 01/01/21 1345 01/01/21 1400 01/01/21 1450  BP: 124/73 131/66 130/70 137/76  Pulse: 71 66 65 68  Resp: (!) 24 19 20 18   Temp:   (!) 97.5 F (36.4 C) 97.8 F (36.6 C)  TempSrc:    Oral  SpO2: 100% 100% 100% 100%  Weight:      Height:       Body mass index is 25.42 kg/m.  Physical Exam  General/constitutional: no distress, pleasant HEENT: Normocephalic, PER, Conj Clear, EOMI, Oropharynx clear Neck supple CV: rrr no mrg Lungs: clear to auscultation, normal respiratory effort Abd: Soft, Nontender Ext: no edema Skin: right chest dressing c/d Neuro: nonfocal MSK: no peripheral joint swelling/tenderness/warmth; back spines nontender  Lab Results Lab Results   Component Value Date   WBC 12.4 (H) 01/01/2021   HGB 7.4 (L) 01/01/2021   HCT 24.3 (L) 01/01/2021   MCV 98.0 01/01/2021   PLT 184 01/01/2021    Lab Results  Component Value Date   CREATININE 0.87 01/01/2021   BUN 22 01/01/2021   NA 134 (L) 01/01/2021   K 4.6 01/01/2021   CL 105 01/01/2021   CO2 20 (L) 01/01/2021    Lab Results  Component Value Date   ALT 21 12/29/2020   AST 24 12/29/2020   ALKPHOS 53 12/29/2020   BILITOT 1.1 12/29/2020      Microbiology: Recent Results (from the past 240 hour(s))  Resp Panel by RT-PCR (Flu A&B, Covid) Nasopharyngeal Swab     Status: None   Collection Time: 12/29/20 10:12 AM   Specimen: Nasopharyngeal Swab; Nasopharyngeal(NP) swabs in vial transport medium  Result Value Ref Range Status   SARS Coronavirus 2 by RT PCR NEGATIVE NEGATIVE Final    Comment: (NOTE) SARS-CoV-2 target nucleic acids are NOT DETECTED.  The SARS-CoV-2 RNA is generally detectable in upper respiratory specimens during the acute phase of infection. The lowest concentration of SARS-CoV-2 viral copies this assay can detect is 138 copies/mL. A negative result does not preclude SARS-Cov-2 infection and should not be used as the sole basis for treatment or other patient management decisions. A negative result may occur with  improper specimen collection/handling, submission of specimen other than nasopharyngeal swab, presence of viral mutation(s) within the areas targeted by this assay, and inadequate number of viral copies(<138 copies/mL). A negative result must be combined with clinical observations, patient history, and epidemiological information. The expected result is Negative.  Fact Sheet for Patients:  EntrepreneurPulse.com.au  Fact Sheet for Healthcare Providers:  IncredibleEmployment.be  This test is no t yet approved or cleared by the [place] FDA and  has been authorized for detection and/or diagnosis of  SARS-CoV-2 by FDA under an Emergency Use Authorization (EUA). This EUA will remain  in effect (meaning this test can be used) for the duration of the COVID-19 declaration under Section 564(b)(1) of the Act, 21 U.S.C.section 360bbb-3(b)(1), unless the authorization is terminated  or revoked sooner.       Influenza A by PCR NEGATIVE NEGATIVE Final   Influenza B by PCR NEGATIVE NEGATIVE Final    Comment: (NOTE) The Xpert Xpress SARS-CoV-2/FLU/RSV plus assay is intended as an aid in the diagnosis of influenza from Nasopharyngeal swab specimens and should not be used as a sole basis for treatment. Nasal washings and aspirates are unacceptable for Xpert Xpress SARS-CoV-2/FLU/RSV testing.  Fact Sheet for Patients: EntrepreneurPulse.com.au  Fact Sheet for Healthcare Providers: IncredibleEmployment.be  This test is not yet approved or cleared by the [place] FDA and has been authorized for detection and/or diagnosis of SARS-CoV-2  by FDA under an Emergency Use Authorization (EUA). This EUA will remain in effect (meaning this test can be used) for the duration of the COVID-19 declaration under Section 564(b)(1) of the Act, 21 U.S.C. section 360bbb-3(b)(1), unless the authorization is terminated or revoked.  Performed at [facility], [address] [place], [place] [address]   Culture, blood (routine x 2)     Status: Abnormal (Preliminary result)   Collection Time: 12/29/20 10:38 AM   Specimen: BLOOD RIGHT HAND  Result Value Ref Range Status   Specimen Description   Final    BLOOD RIGHT HAND BOTTLES DRAWN AEROBIC AND ANAEROBIC Performed at [facility], [address] [place], [place] [address]    Special Requests   Final    Blood Culture adequate volume Performed at [place] [place], [place] [address]    Culture  Setup Time   Final    GRAM POSITIVE COCCI IN BOTH AEROBIC AND ANAEROBIC BOTTLES Gram Stain Report Called  to,Read Back By and Verified With: SIMPSON K. AT [facility] AT 2245 ON [id number] BY THOMPSON S. CRITICAL RESULT CALLED TO, READ BACK BY AND VERIFIED WITH: M LILLISTON,PHARMD@0450  12/30/20 [place]    Culture (A)  Final    STAPHYLOCOCCUS AUREUS STAPHYLOCOCCUS EPIDERMIDIS SUSCEPTIBILITIES TO FOLLOW Performed at [facility], [place] [address] [place], [place] [address]    Report Status PENDING  Incomplete   Organism ID, Bacteria STAPHYLOCOCCUS AUREUS  Final      Susceptibility   Staphylococcus aureus - MIC*    CIPROFLOXACIN <=0.5 SENSITIVE Sensitive     ERYTHROMYCIN <=0.25 SENSITIVE Sensitive     GENTAMICIN <=0.5 SENSITIVE Sensitive     OXACILLIN 0.5 SENSITIVE Sensitive     TETRACYCLINE <=1 SENSITIVE Sensitive     VANCOMYCIN <=0.5 SENSITIVE Sensitive     TRIMETH/SULFA <=10 SENSITIVE Sensitive     CLINDAMYCIN <=0.25 SENSITIVE Sensitive     RIFAMPIN <=0.5 SENSITIVE Sensitive     Inducible Clindamycin NEGATIVE Sensitive     * STAPHYLOCOCCUS AUREUS  Culture, blood (routine x 2)     Status: Abnormal (Preliminary result)   Collection Time: 12/29/20 10:38 AM   Specimen: Left Antecubital; Blood  Result Value Ref Range Status   Specimen Description   Final    LEFT ANTECUBITAL BOTTLES DRAWN AEROBIC ONLY Performed at [facility], [address] [place], [place] [address]    Special Requests   Final    Blood Culture adequate volume Performed at [facility], [address], [place], [place] [address]    Culture  Setup Time   Final    GRAM POSITIVE COCCI AEROBIC BOTTLE ONLY CRITICAL VALUE NOTED.  VALUE IS CONSISTENT WITH PREVIOUSLY REPORTED AND CALLED VALUE.    Culture (A)  Final    STAPHYLOCOCCUS EPIDERMIDIS CULTURE REINCUBATED FOR BETTER GROWTH Performed at [facility], [place] [address] [place], [place] [address]    Report Status PENDING  Incomplete  Blood Culture ID Panel (Reflexed)     Status: Abnormal   Collection Time: 12/29/20 10:38 AM  Result Value Ref Range Status   Enterococcus  faecalis NOT DETECTED NOT DETECTED Final   Enterococcus Faecium NOT DETECTED NOT DETECTED Final   Listeria monocytogenes NOT DETECTED NOT DETECTED Final   Staphylococcus species DETECTED (A) NOT DETECTED Final    Comment: CRITICAL RESULT CALLED TO, READ BACK BY AND VERIFIED WITH: M LILLISTON,PHARMD@0450  12/30/20 [place]    Staphylococcus aureus (BCID) DETECTED (A) NOT DETECTED Final    Comment: CRITICAL  RESULT CALLED TO, READ BACK BY AND VERIFIED WITH: M LILLISTON,PHARMD@0450  12/30/20 [place]    Staphylococcus epidermidis DETECTED (A) NOT DETECTED Final    Comment: CRITICAL RESULT CALLED TO, READ BACK BY AND VERIFIED WITH: M LILLISTON,PHARMD@0453  12/30/20 [place]    Staphylococcus lugdunensis NOT DETECTED NOT DETECTED Final   Streptococcus species NOT DETECTED NOT DETECTED Final   Streptococcus agalactiae NOT DETECTED NOT DETECTED Final   Streptococcus pneumoniae NOT DETECTED NOT DETECTED Final   Streptococcus pyogenes NOT DETECTED NOT DETECTED Final   A.calcoaceticus-baumannii NOT DETECTED NOT DETECTED Final   Bacteroides fragilis NOT DETECTED NOT DETECTED Final   Enterobacterales NOT DETECTED NOT DETECTED Final   Enterobacter cloacae complex NOT DETECTED NOT DETECTED Final   Escherichia coli NOT DETECTED NOT DETECTED Final   Klebsiella aerogenes NOT DETECTED NOT DETECTED Final   Klebsiella oxytoca NOT DETECTED NOT DETECTED Final   Klebsiella pneumoniae NOT DETECTED NOT DETECTED Final   Proteus species NOT DETECTED NOT DETECTED Final   Salmonella species NOT DETECTED NOT DETECTED Final   Serratia marcescens NOT DETECTED NOT DETECTED Final   Haemophilus influenzae NOT DETECTED NOT DETECTED Final   Neisseria meningitidis NOT DETECTED NOT DETECTED Final   Pseudomonas aeruginosa NOT DETECTED NOT DETECTED Final   Stenotrophomonas maltophilia NOT DETECTED NOT DETECTED Final   Candida albicans NOT DETECTED NOT DETECTED Final   Candida auris NOT DETECTED NOT DETECTED Final   Candida glabrata NOT  DETECTED NOT DETECTED Final   Candida krusei NOT DETECTED NOT DETECTED Final   Candida parapsilosis NOT DETECTED NOT DETECTED Final   Candida tropicalis NOT DETECTED NOT DETECTED Final   Cryptococcus neoformans/gattii NOT DETECTED NOT DETECTED Final   Methicillin resistance mecA/C DETECTED (A) NOT DETECTED Final    Comment: CRITICAL RESULT CALLED TO, READ BACK BY AND VERIFIED WITH: M LILLISTON,PHARMD@0453  12/30/20 [place]    Meth resistant mecA/C and MREJ NOT DETECTED NOT DETECTED Final    Comment: Performed at [facility], [address] [place], [place] [address]  MRSA Next Gen by PCR, Nasal     Status: None   Collection Time: 12/29/20 11:04 PM   Specimen: Nasal Mucosa; Nasal Swab  Result Value Ref Range Status   MRSA by PCR Next Gen NOT DETECTED NOT DETECTED Final    Comment: (NOTE) The GeneXpert MRSA Assay (FDA approved for NASAL specimens only), is one component of a comprehensive MRSA colonization surveillance program. It is not intended to diagnose MRSA infection nor to guide or monitor treatment for MRSA infections. Test performance is not FDA approved in patients less than 64 years old. Performed at [facility] [place] [address], [place], [place] [address]   Culture, blood (routine x 2)     Status: None (Preliminary result)   Collection Time: 12/30/20  3:34 PM   Specimen: BLOOD RIGHT HAND  Result Value Ref Range Status   Specimen Description   Final    BLOOD RIGHT HAND Performed at [facility] [address] [place], [place] [address]    Special Requests   Final    BOTTLES DRAWN AEROBIC ONLY Blood Culture results may not be optimal due to an inadequate volume of blood received in culture bottles Performed at [facility] [address], [place], [place] [address]    Culture   Final    NO GROWTH 2 DAYS Performed at [place] [facility] [address], [place], [place] [address]    Report Status PENDING   Incomplete  Culture, blood (routine x 2)     Status: None (Preliminary result)   Collection Time: 12/30/20  3:34 PM   Specimen: BLOOD  Result Value Ref Range Status   Specimen Description   Final    BLOOD LEFT ANTECUBITAL Performed at [facility] [address], [place], [place] [address]    Special Requests   Final    BOTTLES DRAWN AEROBIC ONLY Blood Culture results may not be optimal due to an inadequate volume of blood received in culture bottles Performed at [facility] [address] [place], [place] [address]    Culture   Final    NO GROWTH 2 DAYS Performed at [place] [address] [place], [place] [address]    Report Status PENDING  Incomplete  Expectorated Sputum Assessment w Gram Stain, Rflx to Resp Cult     Status: None   Collection Time: 12/31/20  8:35 AM   Specimen: Expectorated Sputum  Result Value Ref Range Status   Specimen Description EXPECTORATED SPUTUM  Final   Special Requests NONE  Final   Sputum evaluation   Final    THIS SPECIMEN IS ACCEPTABLE FOR SPUTUM CULTURE Performed at [facility], [place] [address] [place], [place] [address]    Report Status 12/31/2020 FINAL  Final  Culture, Respiratory w Gram Stain     Status: None (Preliminary result)   Collection Time: 12/31/20  8:35 AM  Result Value Ref Range Status   Specimen Description   Final    EXPECTORATED SPUTUM Performed at [place] [address], [place], [place] [address]    Special Requests   Final    NONE Reflexed from [id number] Performed at [facility], [place] [address] [place], [place] [address]    Gram Stain   Final    RARE SQUAMOUS EPITHELIAL CELLS PRESENT RARE WBC PRESENT,BOTH PMN AND MONONUCLEAR NO ORGANISMS SEEN    Culture   Final    RARE STAPHYLOCOCCUS AUREUS CULTURE REINCUBATED FOR BETTER GROWTH Performed at [facility], [place] [address] [place],   [address]    Report Status PENDING  Incomplete     Serology:   Imaging: If present, new imagings (plain films, ct scans, and mri) have been personally visualized and interpreted; radiology reports have been reviewed. Decision making incorporated into the Impression / Recommendations.  11/13 tte No evidence of valvular vegetations on  this transthoracic echocardiogram. Consider a transesophageal  echocardiogram to exclude infective endocarditis if clinically indicated  Jabier Mutton, [place] for [place] [phone] pager    01/01/2021, 3:39 PM

## 2021-01-01 NOTE — Transfer of Care (Signed)
Immediate Anesthesia Transfer of Care Note  Patient: Alan [name]  Procedure(s) Performed: REMOVAL PORT-A-CATH  Patient Location: PACU  Anesthesia Type:General  Level of Consciousness: drowsy and patient cooperative  Airway & Oxygen Therapy: Patient Spontanous Breathing and Patient connected to face mask oxygen  Post-op Assessment: Report given to RN and Post -op Vital signs reviewed and stable  Post vital signs: Reviewed and stable  Last Vitals:  Vitals Value Taken Time  BP 131/66 01/01/21 1315  Temp    Pulse 62 01/01/21 1317  Resp 13 01/01/21 1317  SpO2 100 % 01/01/21 1317  Vitals shown include unvalidated device data.  Last Pain:  Vitals:   01/01/21 1043  TempSrc: Oral  PainSc:       Patients Stated Pain Goal: 0 (52/17/47 1595)  Complications: No notable events documented.

## 2021-01-01 NOTE — Progress Notes (Signed)
PROGRESS NOTE  Alan [name] KDX:[id number] DOB: 07-Sep-1950 DOA: 12/29/2020 PCP: [facility]  HPI/Recap of past 24 hours: Alan [name] is a 70 y.o. male with medical history significant for metastatic lung cancer on active chemotherapy, s/p right frontal craniotomy for metastatic tumor resection, paroxysmal Afib on Eliquis, HTN, hypothyroidism who presented to [facility] ED for evaluation worsening SOB for the past few days. Noted unchanged chronic cough. Per ED documentation, O2 sats were in the 50s on room air on EMS arrival.  He was placed on NRB with SPO2 improved up to 88%.  Heart rate was in the 130s-150s.  Patient was subsequently taken to [facility] ED. In the ED, initial vitals showed BP 70/50, pulse 162, RR 28, temp 97 degrees rectally, SPO2 89% on 15 L NRB. Labs showed WBC 5.5, BNP 950, lactic acid 2.4 > 2.3.  COVID and influenza PCR's are negative. Portable chest x-ray showed homogenous opacity in the left apex. Patient was cardioverted in the ED successfully and returned to sinus rhythm.  Patient was persistently hypotensive therefore PCCM were consulted. Prior to ICU admission, pt stabilized and TRH consulted for admission. Of note, pt is on active chemotherapy with Taxotere and Cyramza.    Saw patient prior to surgery, denies any new complaints.      Assessment/Plan: Principal Problem:   Acute respiratory failure with hypoxia (HCC) Active Problems:   Paroxysmal atrial fibrillation with RVR (HCC)   Lung cancer (HCC)   AKI (acute kidney injury) ([place])   Anemia associated with chemotherapy   Hypotension   Staphylococcus aureus bacteremia   Malignancy ([place])   Bloodstream infection due to central venous catheter   Acute respiratory failure with hypoxia likely 2/2 CAP ?Chemotherapy pneumonitis Initially requiring 15 L via NRB, now weaned down to RA Currently afebrile, with leukocytosis (on steroids) Sputum culture pending, urine strep negative,  Legionella antigen pending Chest x-ray with new left lung opacity Continue cefdinir, azithromycin for a total of 5 days S/P IV Solu-Medrol--> PO prednisone 40 mg for over 2 weeks per pulmonology Duo nebs, incentive spirometry Continue supplemental oxygen as needed  Sepsis likely 2/2 ??staph aureus/epi bacteremia On admission, noted to be tachycardic, leukocytosis, lactic acidosis, hypotensive BC x2-noted MRSE, staph aureus, awaiting final culture Repeat BC x2 pending ID on board S/p removal of chest port by gen surgery on 01/01/21 Echo with EF 60-65%, no RWMA, small pericardial effusion, no evidence of valvular vegetation Cardiology consulted for TEE Continue vancomycin for now Monitor closely  Paroxysmal atrial fibrillation with RVR Cardioverted in the ED, now in sinus rhythm Continue metoprolol, Eliquis (bridging with heparin perioperatively)   Hypotension/history of hypertension Hypotension resolved Continue metoprolol   Acute kidney injury Resolved Creatinine 1.44 on admission compared to baseline 1.0-1.1 Daily BMP  Diabetes mellitus type 2 Last A1c noted to be 6.6 SSI, Accu-Cheks, hypoglycemic protocol   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 Delton Coombes.  On active chemotherapy with Taxotere and Cyramza Last treatment was 12/19/2020   Anemia of chronic disease/chemo associated Hemoglobin dropping Hemoglobin 9.0 on admission, baseline around 11  Worsened by hemodilution Denies any recent bleeding Anemia panel iron 69, sats 46, ferritin 1230, b12 613 Daily CBC  Hypothyroidism Continue Synthroid   Anxiety: Continue home Xanax as needed    Estimated body mass index is 25.42 kg/m as calculated from the following:   Height as of this encounter: 5\' 5"  (1.651 m).  Weight as of this encounter: 69.3 kg.     Code Status: Full  Family Communication: Wife  at bedside  Disposition Plan: Status is: Inpatient  Remains inpatient appropriate because: Level of care     Consultants: PCCM ID General surgery  Procedures: Port cath removal on 01/01/21  Antimicrobials: Azithromycin Ceftriaxone Vancomycin  DVT prophylaxis: Eliquis (bridging with heparin)   Objective: Vitals:   01/01/21 1330 01/01/21 1345 01/01/21 1400 01/01/21 1450  BP: 124/73 131/66 130/70 137/76  Pulse: 71 66 65 68  Resp: (!) 24 19 20 18   Temp:   (!) 97.5 F (36.4 C) 97.8 F (36.6 C)  TempSrc:    Oral  SpO2: 100% 100% 100% 100%  Weight:      Height:        Intake/Output Summary (Last 24 hours) at 01/01/2021 1814 Last data filed at 01/01/2021 1500 Gross per 24 hour  Intake 1543.84 ml  Output 525 ml  Net 1018.84 ml   Filed Weights   12/29/20 1014 12/31/20 1417 01/01/21 0500  Weight: 72.4 kg 69.3 kg 69.3 kg    Exam: General: NAD  Cardiovascular: S1, S2 present Respiratory: Diminished breath sounds bilaterally Abdomen: Soft, nontender, nondistended, bowel sounds present, noted groin rash Musculoskeletal: No bilateral pedal edema noted Skin: As above Psychiatry: Normal mood     Data Reviewed: CBC: Recent Labs  Lab 12/29/20 1011 12/30/20 0746 12/31/20 0530 01/01/21 0346  WBC 5.5 7.2 10.2 12.4*  NEUTROABS 3.8  --  8.2* 10.2*  HGB 9.0* 7.3* 7.2* 7.4*  HCT 27.4* 23.2* 22.7* 24.3*  MCV 96.5 95.9 97.0 98.0  PLT 230 198 192 419   Basic Metabolic Panel: Recent Labs  Lab 12/29/20 1011 12/30/20 0746 12/31/20 0530 01/01/21 0346  NA 134* 135 134* 134*  K 3.9 4.3 4.0 4.6  CL 104 106 105 105  CO2 18* 20* 23 20*  GLUCOSE 163* 142* 131* 212*  BUN 42* 27* 24* 22  CREATININE 1.44* 0.98 0.94 0.87  CALCIUM 8.3* 8.1* 7.9* 8.2*   GFR: Estimated Creatinine Clearance: 67.7 mL/min (by C-G formula based on SCr of 0.87 mg/dL). Liver Function Tests: Recent Labs  Lab 12/29/20 1011  AST 24  ALT 21  ALKPHOS 53  BILITOT 1.1  PROT 6.3*  ALBUMIN  2.6*   No results for input(s): LIPASE, AMYLASE in the last 168 hours. No results for input(s): AMMONIA in the last 168 hours. Coagulation Profile: No results for input(s): INR, PROTIME in the last 168 hours. Cardiac Enzymes: No results for input(s): CKTOTAL, CKMB, CKMBINDEX, TROPONINI in the last 168 hours. BNP (last 3 results) No results for input(s): PROBNP in the last 8760 hours. HbA1C: No results for input(s): HGBA1C in the last 72 hours. CBG: Recent Labs  Lab 12/31/20 2330 01/01/21 0738 01/01/21 1040 01/01/21 1328 01/01/21 1653  GLUCAP 170* 144* 133* 129* 144*   Lipid Profile: No results for input(s): CHOL, HDL, LDLCALC, TRIG, CHOLHDL, LDLDIRECT in the last 72 hours. Thyroid Function Tests: Recent Labs    12/31/20 0530  TSH 0.481  FREET4 1.28*   Anemia Panel: Recent Labs    12/31/20 0530  VITAMINB12 613  FOLATE 6.7  FERRITIN 1,230*  TIBC 150*  IRON 69   Urine analysis:    Component Value Date/Time   COLORURINE YELLOW 12/30/2020 1843   APPEARANCEUR CLOUDY (A) 12/30/2020 1843   LABSPEC 1.021 12/30/2020 1843   PHURINE 5.0 12/30/2020 1843   GLUCOSEU NEGATIVE 12/30/2020 1843   HGBUR NEGATIVE 12/30/2020 1843   BILIRUBINUR  NEGATIVE 12/30/2020 [place] 12/30/2020 1843   PROTEINUR NEGATIVE 12/30/2020 1843   NITRITE NEGATIVE 12/30/2020 1843   LEUKOCYTESUR NEGATIVE 12/30/2020 1843   Sepsis Labs: @LABRCNTIP (procalcitonin:4,lacticidven:4)  ) Recent Results (from the past 240 hour(s))  Resp Panel by RT-PCR (Flu A&B, Covid) Nasopharyngeal Swab     Status: None   Collection Time: 12/29/20 10:12 AM   Specimen: Nasopharyngeal Swab; Nasopharyngeal(NP) swabs in vial transport medium  Result Value Ref Range Status   SARS Coronavirus 2 by RT PCR NEGATIVE NEGATIVE Final    Comment: (NOTE) SARS-CoV-2 target nucleic acids are NOT DETECTED.  The SARS-CoV-2 RNA is generally detectable in upper respiratory specimens during the acute phase of infection.  The lowest concentration of SARS-CoV-2 viral copies this assay can detect is 138 copies/mL. A negative result does not preclude SARS-Cov-2 infection and should not be used as the sole basis for treatment or other patient management decisions. A negative result may occur with  improper specimen collection/handling, submission of specimen other than nasopharyngeal swab, presence of viral mutation(s) within the areas targeted by this assay, and inadequate number of viral copies(<138 copies/mL). A negative result must be combined with clinical observations, patient history, and epidemiological information. The expected result is Negative.  Fact Sheet for Patients:  EntrepreneurPulse.com.au  Fact Sheet for Healthcare Providers:  IncredibleEmployment.be  This test is no t yet approved or cleared by the [place] FDA and  has been authorized for detection and/or diagnosis of SARS-CoV-2 by FDA under an Emergency Use Authorization (EUA). This EUA will remain  in effect (meaning this test can be used) for the duration of the COVID-19 declaration under Section 564(b)(1) of the Act, 21 U.S.C.section 360bbb-3(b)(1), unless the authorization is terminated  or revoked sooner.       Influenza A by PCR NEGATIVE NEGATIVE Final   Influenza B by PCR NEGATIVE NEGATIVE Final    Comment: (NOTE) The Xpert Xpress SARS-CoV-2/FLU/RSV plus assay is intended as an aid in the diagnosis of influenza from Nasopharyngeal swab specimens and should not be used as a sole basis for treatment. Nasal washings and aspirates are unacceptable for Xpert Xpress SARS-CoV-2/FLU/RSV testing.  Fact Sheet for Patients: EntrepreneurPulse.com.au  Fact Sheet for Healthcare Providers: IncredibleEmployment.be  This test is not yet approved or cleared by the [place] FDA and has been authorized for detection and/or diagnosis of SARS-CoV-2 by FDA  under an Emergency Use Authorization (EUA). This EUA will remain in effect (meaning this test can be used) for the duration of the COVID-19 declaration under Section 564(b)(1) of the Act, 21 U.S.C. section 360bbb-3(b)(1), unless the authorization is terminated or revoked.  Performed at [facility], [address], [place], [place] [address]   Culture, blood (routine x 2)     Status: Abnormal (Preliminary result)   Collection Time: 12/29/20 10:38 AM   Specimen: BLOOD RIGHT HAND  Result Value Ref Range Status   Specimen Description   Final    BLOOD RIGHT HAND BOTTLES DRAWN AEROBIC AND ANAEROBIC Performed at [facility], [address] [place] [address], [place], [place] [address]    Special Requests   Final    Blood Culture adequate volume Performed at [place] [place], [place] [address]    Culture  Setup Time   Final    GRAM POSITIVE COCCI IN BOTH AEROBIC AND ANAEROBIC BOTTLES Gram Stain Report Called to,Read Back By and Verified With: SIMPSON K. AT [facility] AT 2245 ON [id number] BY THOMPSON S. CRITICAL RESULT CALLED TO,  READ BACK BY AND VERIFIED WITH: M LILLISTON,PHARMD@0450  12/30/20 [place]    Culture (A)  Final    STAPHYLOCOCCUS AUREUS STAPHYLOCOCCUS EPIDERMIDIS SUSCEPTIBILITIES TO FOLLOW Performed at [facility], [place] [address], [place], [place] [address]    Report Status PENDING  Incomplete   Organism ID, Bacteria STAPHYLOCOCCUS AUREUS  Final      Susceptibility   Staphylococcus aureus - MIC*    CIPROFLOXACIN <=0.5 SENSITIVE Sensitive     ERYTHROMYCIN <=0.25 SENSITIVE Sensitive     GENTAMICIN <=0.5 SENSITIVE Sensitive     OXACILLIN 0.5 SENSITIVE Sensitive     TETRACYCLINE <=1 SENSITIVE Sensitive     VANCOMYCIN <=0.5 SENSITIVE Sensitive     TRIMETH/SULFA <=10 SENSITIVE Sensitive     CLINDAMYCIN <=0.25 SENSITIVE Sensitive     RIFAMPIN <=0.5 SENSITIVE Sensitive     Inducible Clindamycin NEGATIVE Sensitive     * STAPHYLOCOCCUS AUREUS  Culture, blood (routine x 2)     Status:  Abnormal (Preliminary result)   Collection Time: 12/29/20 10:38 AM   Specimen: Left Antecubital; Blood  Result Value Ref Range Status   Specimen Description   Final    LEFT ANTECUBITAL BOTTLES DRAWN AEROBIC ONLY Performed at [facility], [address] [place], [place] [address]    Special Requests   Final    Blood Culture adequate volume Performed at [facility], [address], [place], [place] [address]    Culture  Setup Time   Final    GRAM POSITIVE COCCI AEROBIC BOTTLE ONLY CRITICAL VALUE NOTED.  VALUE IS CONSISTENT WITH PREVIOUSLY REPORTED AND CALLED VALUE.    Culture (A)  Final    STAPHYLOCOCCUS EPIDERMIDIS CULTURE REINCUBATED FOR BETTER GROWTH Performed at [facility], [place] [address] [place], [place] [address]    Report Status PENDING  Incomplete  Blood Culture ID Panel (Reflexed)     Status: Abnormal   Collection Time: 12/29/20 10:38 AM  Result Value Ref Range Status   Enterococcus faecalis NOT DETECTED NOT DETECTED Final   Enterococcus Faecium NOT DETECTED NOT DETECTED Final   Listeria monocytogenes NOT DETECTED NOT DETECTED Final   Staphylococcus species DETECTED (A) NOT DETECTED Final    Comment: CRITICAL RESULT CALLED TO, READ BACK BY AND VERIFIED WITH: M LILLISTON,PHARMD@0450  12/30/20 [place]    Staphylococcus aureus (BCID) DETECTED (A) NOT DETECTED Final    Comment: CRITICAL RESULT CALLED TO, READ BACK BY AND VERIFIED WITH: M LILLISTON,PHARMD@0450  12/30/20 [place]    Staphylococcus epidermidis DETECTED (A) NOT DETECTED Final    Comment: CRITICAL RESULT CALLED TO, READ BACK BY AND VERIFIED WITH: M LILLISTON,PHARMD@0453  12/30/20 [place]    Staphylococcus lugdunensis NOT DETECTED NOT DETECTED Final   Streptococcus species NOT DETECTED NOT DETECTED Final   Streptococcus agalactiae NOT DETECTED NOT DETECTED Final   Streptococcus pneumoniae NOT DETECTED NOT DETECTED Final   Streptococcus pyogenes NOT DETECTED NOT DETECTED Final   A.calcoaceticus-baumannii NOT DETECTED  NOT DETECTED Final   Bacteroides fragilis NOT DETECTED NOT DETECTED Final   Enterobacterales NOT DETECTED NOT DETECTED Final   Enterobacter cloacae complex NOT DETECTED NOT DETECTED Final   Escherichia coli NOT DETECTED NOT DETECTED Final   Klebsiella aerogenes NOT DETECTED NOT DETECTED Final   Klebsiella oxytoca NOT DETECTED NOT DETECTED Final   Klebsiella pneumoniae NOT DETECTED NOT DETECTED Final   Proteus species NOT DETECTED NOT DETECTED Final   Salmonella species NOT DETECTED NOT DETECTED Final   Serratia marcescens NOT DETECTED NOT DETECTED Final   Haemophilus influenzae NOT DETECTED NOT  DETECTED Final   Neisseria meningitidis NOT DETECTED NOT DETECTED Final   Pseudomonas aeruginosa NOT DETECTED NOT DETECTED Final   Stenotrophomonas maltophilia NOT DETECTED NOT DETECTED Final   Candida albicans NOT DETECTED NOT DETECTED Final   Candida auris NOT DETECTED NOT DETECTED Final   Candida glabrata NOT DETECTED NOT DETECTED Final   Candida krusei NOT DETECTED NOT DETECTED Final   Candida parapsilosis NOT DETECTED NOT DETECTED Final   Candida tropicalis NOT DETECTED NOT DETECTED Final   Cryptococcus neoformans/gattii NOT DETECTED NOT DETECTED Final   Methicillin resistance mecA/C DETECTED (A) NOT DETECTED Final    Comment: CRITICAL RESULT CALLED TO, READ BACK BY AND VERIFIED WITH: M LILLISTON,PHARMD@0453  12/30/20 [place]    Meth resistant mecA/C and MREJ NOT DETECTED NOT DETECTED Final    Comment: Performed at [facility], [address] [place], [place] [address]  MRSA Next Gen by PCR, Nasal     Status: None   Collection Time: 12/29/20 11:04 PM   Specimen: Nasal Mucosa; Nasal Swab  Result Value Ref Range Status   MRSA by PCR Next Gen NOT DETECTED NOT DETECTED Final    Comment: (NOTE) The GeneXpert MRSA Assay (FDA approved for NASAL specimens only), is one component of a comprehensive MRSA colonization surveillance program. It is not intended to diagnose MRSA infection nor to  guide or monitor treatment for MRSA infections. Test performance is not FDA approved in patients less than 82 years old. Performed at [facility], [place] [address] [place], [place] [address]   Culture, blood (routine x 2)     Status: None (Preliminary result)   Collection Time: 12/30/20  3:34 PM   Specimen: BLOOD RIGHT HAND  Result Value Ref Range Status   Specimen Description   Final    BLOOD RIGHT HAND Performed at [place] [address], [place], [place] [address]    Special Requests   Final    BOTTLES DRAWN AEROBIC ONLY Blood Culture results may not be optimal due to an inadequate volume of blood received in culture bottles Performed at [place] [address], [place], [place] [address]    Culture   Final    NO GROWTH 2 DAYS Performed at [place] [address], [place], [place] [address]    Report Status PENDING  Incomplete  Culture, blood (routine x 2)     Status: None (Preliminary result)   Collection Time: 12/30/20  3:34 PM   Specimen: BLOOD  Result Value Ref Range Status   Specimen Description   Final    BLOOD LEFT ANTECUBITAL Performed at [place] [address] [place], [place] [address]    Special Requests   Final    BOTTLES DRAWN AEROBIC ONLY Blood Culture results may not be optimal due to an inadequate volume of blood received in culture bottles Performed at [place] [address] [place], [place] [address]    Culture   Final    NO GROWTH 2 DAYS Performed at [place] [address], [place], [place] [address]    Report Status PENDING  Incomplete  Expectorated Sputum Assessment w Gram Stain, Rflx to Resp Cult     Status: None   Collection Time: 12/31/20  8:35 AM   Specimen: Expectorated Sputum  Result Value Ref Range Status   Specimen Description EXPECTORATED SPUTUM  Final   Special Requests NONE  Final   Sputum  evaluation   Final    THIS SPECIMEN IS ACCEPTABLE FOR SPUTUM CULTURE Performed at [facility] [address], [place], [place] [address]    Report Status 12/31/2020 FINAL  Final  Culture, Respiratory w Gram Stain     Status: None (Preliminary result)   Collection Time: 12/31/20  8:35 AM  Result Value Ref Range Status   Specimen Description   Final    EXPECTORATED SPUTUM Performed at [place] [address], [place], [place] [address]    Special Requests   Final    NONE Reflexed from [id number] Performed at [facility], [place] [address] [place], [place] [address]    Gram Stain   Final    RARE SQUAMOUS EPITHELIAL CELLS PRESENT RARE WBC PRESENT,BOTH PMN AND MONONUCLEAR NO ORGANISMS SEEN    Culture   Final    RARE STAPHYLOCOCCUS AUREUS CULTURE REINCUBATED FOR BETTER GROWTH Performed at [facility], [place] [address], [place], [place] [address]    Report Status PENDING  Incomplete      Studies: No results found.  Scheduled Meds:  azithromycin  500 mg Oral Daily   cefdinir  300 mg Oral Q12H   Chlorhexidine Gluconate Cloth  6 each Topical Daily   feeding supplement  237 mL Oral TID BM   insulin aspart  0-5 Units Subcutaneous QHS   insulin aspart  0-9 Units Subcutaneous TID WC   ipratropium-albuterol  3 mL Nebulization BID   levothyroxine  100 mcg Oral QAC breakfast   metoprolol tartrate  25 mg Oral BID   miconazole   Topical BID   multivitamin with minerals  1 tablet Oral Daily   pantoprazole  40 mg Oral Daily   predniSONE  40 mg Oral Q breakfast   sodium chloride flush  3 mL Intravenous Q12H    Continuous Infusions:  sodium chloride 50 mL/hr at 01/01/21 1456   sodium chloride Stopped (12/31/20 1202)   heparin     vancomycin 1,250 mg (01/01/21 9458)     LOS: 3 days     Alma Friendly, MD [facility]  If 7PM-7AM, please contact night-coverage [email or web address] 01/01/2021, 6:14 PM

## 2021-01-01 NOTE — Anesthesia Preprocedure Evaluation (Addendum)
Anesthesia Evaluation  Patient identified by MRN, date of birth, ID band Patient awake    Reviewed: Allergy & Precautions, NPO status , Patient's Chart, lab work & pertinent test results, reviewed documented beta blocker date and time   Airway Mallampati: I  TM Distance: >3 FB Neck ROM: Full    Dental  (+) Edentulous Upper, Edentulous Lower, Dental Advisory Given   Pulmonary neg pulmonary ROS, former smoker,  Metastatic lung CA currently on chemo, brain mets   Pulmonary exam normal breath sounds clear to auscultation       Cardiovascular hypertension, Pt. on home beta blockers and Pt. on medications + Past MI  Normal cardiovascular exam+ dysrhythmias (on eliquis) Atrial Fibrillation  Rhythm:Regular Rate:Normal  TTE 2022 1. Left ventricular ejection fraction, by estimation, is 60 to 65%. The  left ventricle has normal function. The left ventricle has no regional  wall motion abnormalities. Left ventricular diastolic parameters were  normal.  2. Right ventricular systolic function is normal. The right ventricular  size is normal.  3. A small pericardial effusion is present. The pericardial effusion is  posterior to the left ventricle. There is no evidence of cardiac  tamponade.  4. The mitral valve is normal in structure. Mild mitral valve  regurgitation. No evidence of mitral stenosis.  5. The aortic valve is normal in structure. Aortic valve regurgitation is  not visualized. No aortic stenosis is present.  6. The inferior vena cava is normal in size with greater than 50%  respiratory variability, suggesting right atrial pressure of 3 mmHg.    Neuro/Psych negative neurological ROS  negative psych ROS   GI/Hepatic negative GI ROS, Neg liver ROS,   Endo/Other  Hypothyroidism   Renal/GU negative Renal ROS  negative genitourinary   Musculoskeletal negative musculoskeletal ROS (+)   Abdominal   Peds   Hematology  (+) anemia , Lab Results      Component                Value               Date                      WBC                      12.4 (H)            01/01/2021                HGB                      7.4 (L)             01/01/2021                HCT                      24.3 (L)            01/01/2021                MCV                      98.0                01/01/2021                PLT  184                 01/01/2021              Anesthesia Other Findings   Reproductive/Obstetrics                            Anesthesia Physical Anesthesia Plan  ASA: 3  Anesthesia Plan: General   Post-op Pain Management:    Induction: Intravenous  PONV Risk Score and Plan: 2 and Ondansetron and Dexamethasone  Airway Management Planned: LMA  Additional Equipment:   Intra-op Plan:   Post-operative Plan: Extubation in OR  Informed Consent: I have reviewed the patients History and Physical, chart, labs and discussed the procedure including the risks, benefits and alternatives for the proposed anesthesia with the patient or authorized representative who has indicated his/her understanding and acceptance.     Dental advisory given  Plan Discussed with: CRNA  Anesthesia Plan Comments:         Anesthesia Quick Evaluation

## 2021-01-01 NOTE — Plan of Care (Signed)

## 2021-01-01 NOTE — Progress Notes (Signed)
ANTICOAGULATION CONSULT NOTE  Pharmacy Consult for heparin Indication: hx atrial fibrillation (PTA Eliquis on hold)  No Known Allergies  Patient Measurements: Height: 5\' 5"  (165.1 cm) Weight: 69.3 kg (152 lb 12.5 oz) IBW/kg (Calculated) : 61.5 Heparin Dosing Weight: 69 kg  Vital Signs: Temp: 97.8 F (36.6 C) (11/15 1450) Temp Source: Oral (11/15 1450) BP: 137/76 (11/15 1450) Pulse Rate: 68 (11/15 1450)  Labs: Recent Labs    12/30/20 0746 12/31/20 0530 01/01/21 0346  HGB 7.3* 7.2* 7.4*  HCT 23.2* 22.7* 24.3*  PLT 198 192 184  CREATININE 0.98 0.94 0.87    Estimated Creatinine Clearance: 67.7 mL/min (by C-G formula based on SCr of 0.87 mg/dL).   Medications:  - on Eliquis 5mg  bid (last dose taken on 12/31/20 at 1056)  Assessment: Patient is a 70 y.o M with lung cancer on chemotherapy and afib on Eliquis PTA, presented to the ED on 11/12 with SOB and lightheadedness. He now has staph aureus and staph epi bacteremia and being treated with IV abx.  He was taken to the OR on 11/15 for removal of port-a-cath. Pharmacy has been consulted to start heparin post-op on 11/15 while Eliquis is on hold.   Goal of Therapy:  Heparin level 0.3-0.7 units/ml aPTT 66-102 seconds Monitor platelets by anticoagulation protocol: Yes   Plan:  - baseline heparin level and aPTT now - will resume heparin drip at 1000 units/hr (no bolus per CCS's request) - check 8 hr heparin level - monitor for s/sx bleeding  Verlie Hellenbrand P 01/01/2021,3:51 PM

## 2021-01-01 NOTE — Plan of Care (Signed)
  Problem: Education: Goal: Knowledge of General Education information will improve Description: Including pain rating scale, medication(s)/side effects and non-pharmacologic comfort measures 01/01/2021 1546 by Zadie Rhine, RN Outcome: Progressing 01/01/2021 1341 by Zadie Rhine, RN Outcome: Progressing   Problem: Health Behavior/Discharge Planning: Goal: Ability to manage health-related needs will improve 01/01/2021 1546 by Zadie Rhine, RN Outcome: Progressing 01/01/2021 1341 by Zadie Rhine, RN Outcome: Progressing   Problem: Clinical Measurements: Goal: Ability to maintain clinical measurements within normal limits will improve 01/01/2021 1546 by Zadie Rhine, RN Outcome: Progressing 01/01/2021 1341 by Zadie Rhine, RN Outcome: Progressing Goal: Will remain free from infection Outcome: Progressing

## 2021-01-01 NOTE — Anesthesia Procedure Notes (Signed)
Procedure Name: LMA Insertion Date/Time: 01/01/2021 12:39 PM Performed by: West Pugh, CRNA Pre-anesthesia Checklist: Patient identified, Emergency Drugs available, Suction available, Patient being monitored and Timeout performed Patient Re-evaluated:Patient Re-evaluated prior to induction Oxygen Delivery Method: Circle system utilized Preoxygenation: Pre-oxygenation with 100% oxygen Induction Type: IV induction LMA: LMA with gastric port inserted LMA Size: 4.0 Number of attempts: 1 Placement Confirmation: positive ETCO2 Tube secured with: Tape Dental Injury: Teeth and Oropharynx as per pre-operative assessment

## 2021-01-01 NOTE — Telephone Encounter (Signed)
Appt scheduled for 12.2 at 930 with RA in RDS>

## 2021-01-01 NOTE — Interval H&P Note (Signed)
History and Physical Interval Note:  01/01/2021 11:41 AM  Alan [name]  has presented today for surgery, with the diagnosis of bacteremia.  The various methods of treatment have been discussed with the patient and family. After consideration of risks, benefits and other options for treatment, the patient has consented to    Procedure(s): REMOVAL PORT-A-CATH (N/A) as a surgical intervention.    The patient's history has been reviewed, patient examined, no change in status, stable for surgery.  I have reviewed the patient's chart and labs.  Questions were answered to the patient's satisfaction.    Armandina Gemma, [facility] [place] [facility] Office: [place]

## 2021-01-01 NOTE — Plan of Care (Signed)

## 2021-01-01 NOTE — Op Note (Signed)
Operative Note  Pre-operative Diagnosis:  sepsis, suspect infected infusion port  Post-operative Diagnosis:  same  Surgeon:  Armandina Gemma, MD  Assistant:  none   Procedure:  removal of infusion port  Anesthesia:  general (LMA)  Estimated Blood Loss:  none  Drains: 1/4" iodoform wick in subcutaneous tissues         Specimen: cultures to lab  Indications: Patient is a 70 year old male undergoing treatment for lung carcinoma.  Patient developed sepsis and the infusion port was suspected as the source.  Surgery is asked to remove the infusion port.  Procedure:  The patient was seen in the pre-op holding area. The risks, benefits, complications, treatment options, and expected outcomes were previously discussed with the patient. The patient agreed with the proposed plan and has signed the informed consent form.  The patient was brought to the operating room by the surgical team, identified as [name] and the procedure verified. A "time out" was completed and the above information confirmed.  Following administration of anesthesia, the patient is positioned and then prepped and draped in the usual aseptic fashion.  After ascertaining that an adequate level of anesthesia been achieved, the skin at the previous incision in the right upper chest wall was anesthetized with local anesthetic.  The previous scar is reopened with a #15 blade.  Dissection was carried through subcutaneous tissues and the infusion port is identified.  The port is mobilized and delivered up and through the incision.  Aerobic and anaerobic cultures are taken from the subcutaneous pocket around the port.  The catheter tract is closed with a 3-0 Vicryl figure-of-eight suture and the port and catheter are removed in their entirety.  The fibrinous sheath around the port is cauterized with the electrocautery.  Subcutaneous tissues are reapproximated with interrupted 3-0 Vicryl sutures.  Skin is loosely closed with interrupted  4-0 Monocryl subcuticular sutures.  1/4 inch iodoform gauze packing is placed through the incision into the subcutaneous pocket.  Dry gauze dressing is placed over the wound.  Patient is awakened from anesthesia and transported to the recovery room.  The patient tolerated the procedure well.   Armandina Gemma, [facility] Office: [phone]

## 2021-01-01 NOTE — Progress Notes (Signed)
Initial Nutrition Assessment  DOCUMENTATION CODES:  Not applicable  INTERVENTION:  Continue regular diet.  Add Ensure Plus High Protein po TID, each supplement provides 350 kcal and 20 grams of protein.   Add MVI with minerals daily.  Encourage PO and supplement intake.  NUTRITION DIAGNOSIS:  Increased nutrient needs related to cancer and cancer related treatments as evidenced by estimated needs.  GOAL:  Patient will meet greater than or equal to 90% of their needs  MONITOR:  PO intake, Supplement acceptance, Labs, Weight trends, Skin, I & O's  REASON FOR ASSESSMENT:  Malnutrition Screening Tool    ASSESSMENT:  70 yo male with a PMH of metastatic lung cancer on active chemotherapy, s/p right frontal craniotomy for metastatic tumor resection, PAF, HTN, and hypothyroidism who presents with acute respiratory failure with hypoxia. 11/15 - removal of infected infusion port   RD working remotely.  Per Epic, pt ate 75% of breakfast on 11/13. No other meals documented. Pt now on regular diet post-operation.  Per Epic, pt has regained some weight since August 2022. However, pt does have some edema, so this may be masking true weight loss.  Suspect pt is malnourished to some degree but cannot definitively diagnose at this time.  Recommend adding Ensure TID and MVI with minerals daily.  Medications: reviewed; azithromycin, SSI, Synthroid, Protonix, prednisone, NaCl @ 50 ml/hr  Labs: reviewed; Na 134 (L), CBG 129-197 (H) HbA1c: 6.6% (11/14/2020)  NUTRITION - FOCUSED PHYSICAL EXAM: Unable to perform - defer to follow-up  Diet Order:   Diet Order             Diet regular Room service appropriate? Yes; Fluid consistency: Thin  Diet effective now                  EDUCATION NEEDS:  No education needs have been identified at this time  Skin:  Skin Assessment: Skin Integrity Issues: Skin Integrity Issues:: Incisions Incisions: Shoulder, closed  Last BM:  12/31/20 -  Type 5, medium  Height:  Ht Readings from Last 1 Encounters:  12/31/20 5\' 5"  (1.651 m)   Weight:  Wt Readings from Last 1 Encounters:  01/01/21 69.3 kg   BMI:  Body mass index is 25.42 kg/m.  Estimated Nutritional Needs:  Kcal:  2100-2300 Protein:  95-110 grams Fluid:  >2.1 L  Derrel Nip, RD, LDN (she/her/hers) Clinical Inpatient Dietitian RD Pager/After-Hours/Weekend Pager # in [place]

## 2021-01-02 ENCOUNTER — Encounter (HOSPITAL_COMMUNITY): Payer: Self-pay | Admitting: Surgery

## 2021-01-02 LAB — BASIC METABOLIC PANEL
Anion gap: 5 (ref 5–15)
BUN: 20 mg/dL (ref 8–23)
CO2: 20 mmol/L — ABNORMAL LOW (ref 22–32)
Calcium: 8 mg/dL — ABNORMAL LOW (ref 8.9–10.3)
Chloride: 106 mmol/L (ref 98–111)
Creatinine, Ser: 0.87 mg/dL (ref 0.61–1.24)
GFR, Estimated: >60 mL/min (ref >60)
Glucose, Bld: 207 mg/dL — ABNORMAL HIGH (ref 70–99)
Potassium: 4.6 mmol/L (ref 3.5–5.1)
Sodium: 131 mmol/L — ABNORMAL LOW (ref 135–145)

## 2021-01-02 LAB — GLUCOSE, CAPILLARY
Glucose-Capillary: 143 mg/dL — ABNORMAL HIGH (ref 70–99)
Glucose-Capillary: 124 mg/dL — ABNORMAL HIGH (ref 70–99)
Glucose-Capillary: 135 mg/dL — ABNORMAL HIGH (ref 70–99)
Glucose-Capillary: 161 mg/dL — ABNORMAL HIGH (ref 70–99)

## 2021-01-02 LAB — CBC WITH DIFFERENTIAL/PLATELET
Abs Immature Granulocytes: 1.9 10*3/uL — ABNORMAL HIGH (ref 0.00–0.07)
Basophils Absolute: 0 10*3/uL (ref 0.0–0.1)
Basophils Relative: 0 %
Eosinophils Absolute: 0 10*3/uL (ref 0.0–0.5)
Eosinophils Relative: 0 %
HCT: 25.6 % — ABNORMAL LOW (ref 39.0–52.0)
Hemoglobin: 8 g/dL — ABNORMAL LOW (ref 13.0–17.0)
Immature Granulocytes: 12 %
Lymphocytes Relative: 4 %
Lymphs Abs: 0.6 10*3/uL — ABNORMAL LOW (ref 0.7–4.0)
MCH: 30.5 pg (ref 26.0–34.0)
MCHC: 31.3 g/dL (ref 30.0–36.0)
MCV: 97.7 fL (ref 80.0–100.0)
Monocytes Absolute: 1 10*3/uL (ref 0.1–1.0)
Monocytes Relative: 6 %
Neutro Abs: 12.4 10*3/uL — ABNORMAL HIGH (ref 1.7–7.7)
Neutrophils Relative %: 78 %
Platelets: 177 10*3/uL (ref 150–400)
RBC: 2.62 MIL/uL — ABNORMAL LOW (ref 4.22–5.81)
RDW: 16.2 % — ABNORMAL HIGH (ref 11.5–15.5)
WBC: 16 10*3/uL — ABNORMAL HIGH (ref 4.0–10.5)
nRBC: 2.8 % — ABNORMAL HIGH (ref 0.0–0.2)

## 2021-01-02 LAB — CULTURE, BLOOD (ROUTINE X 2): Special Requests: ADEQUATE

## 2021-01-02 LAB — APTT: aPTT: 57 seconds — ABNORMAL HIGH (ref 24–36)

## 2021-01-02 LAB — HEPARIN LEVEL (UNFRACTIONATED): Heparin Unfractionated: >1.1 IU/mL — ABNORMAL HIGH (ref 0.30–0.70)

## 2021-01-02 MED ORDER — APIXABAN 5 MG PO TABS
5.0000 mg | ORAL_TABLET | Freq: Two times a day (BID) | ORAL | Status: DC
  Administered 2021-01-02 – 2021-01-05 (×6): 5 mg via ORAL
  Filled 2021-01-02 (×7): qty 1

## 2021-01-02 NOTE — Progress Notes (Signed)
Occupational Therapy Treatment Patient Details Name: Alan [name] MRN: [id number] DOB: Nov 19, 1950 Today's Date: 01/02/2021   History of present illness Patient is a 70 year old male who presented to the hosptial on 12/29/20 with shortness of breath and chronic cough. patietn was found to have acute respiratory failure with hypoxia, sepsis, a fib with RVR, AKI and bacteremia. PMH: metastatic lung cancer on chemo, HTN, A fib, and anxiety.   OT comments  Pt progressing towards acute OT goals. Pt able to complete household distance functional mobility utilizing rw and 1 standing rest break. Min guard assist for transfers and mobility. DOE 2/4 and coughing episode at end of session which lasted < 5 minutes. HR up to 97. Began energy conservation education. Spouse present. Pt up in recliner at end of session. D/c plan remains appropriate.    Recommendations for follow up therapy are one component of a multi-disciplinary discharge planning process, led by the attending physician.  Recommendations may be updated based on patient status, additional functional criteria and insurance authorization.    Follow Up Recommendations  No OT follow up    Assistance Recommended at Discharge Intermittent Supervision/Assistance  Equipment Recommendations  None recommended by OT    Recommendations for Other Services      Precautions / Restrictions Precautions Precautions: Fall Precaution Comments: monitor O2 and HR Restrictions Weight Bearing Restrictions: No       Mobility Bed Mobility Overal bed mobility: Needs Assistance Bed Mobility: Supine to Sit     Supine to sit: Min guard          Transfers Overall transfer level: Needs assistance Equipment used: Rolling walker (2 wheels) Transfers: Sit to/from Stand Sit to Stand: Min guard           General transfer comment: to/from EOB and recliner. cues for technique with rw     Balance Overall balance assessment: Mild deficits observed,  not formally tested                                         ADL either performed or assessed with clinical judgement   ADL Overall ADL's : Needs assistance/impaired                         Toilet Transfer: Min guard;Rolling walker (2 wheels);Ambulation           Functional mobility during ADLs: Min guard;Rolling walker (2 wheels) General ADL Comments: Pt completed household distance functional mobility utilizing rw. 1 standing rest break. DOE 2/4. Coughing noted at end of session which eased off in under 5 minutes. Began energy conservation education.    Extremity/Trunk Assessment Upper Extremity Assessment Upper Extremity Assessment: Overall WFL for tasks assessed   Lower Extremity Assessment Lower Extremity Assessment: Defer to PT evaluation        Vision       Perception     Praxis      Cognition Arousal/Alertness: Awake/alert Behavior During Therapy: WFL for tasks assessed/performed;Flat affect Overall Cognitive Status: Within Functional Limits for tasks assessed                                            Exercises     Shoulder Instructions       General  Comments DOE 2/4, HR up to 97.    Pertinent Vitals/ Pain       Pain Assessment: No/denies pain  Home Living                                          Prior Functioning/Environment              Frequency  Min 2X/week        Progress Toward Goals  OT Goals(current goals can now be found in the care plan section)  Progress towards OT goals: Progressing toward goals  Acute Rehab OT Goals Patient Stated Goal: none stated OT Goal Formulation: With patient Time For Goal Achievement: 01/14/21 Potential to Achieve Goals: Good ADL Goals Pt Will Transfer to Toilet: with modified independence;ambulating;regular height toilet Additional ADL Goal #1: Patient will perform 10 min functional activity or exercise activity as evidence of  improving activity tolerance Additional ADL Goal #2: patient will verbalize 3 ECT strategies to impelement in ADl tasks at home  Plan Discharge plan remains appropriate    Co-evaluation                 AM-PAC OT "6 Clicks" Daily Activity     Outcome Measure   Help from another person eating meals?: None Help from another person taking care of personal grooming?: None Help from another person toileting, which includes using toliet, bedpan, or urinal?: A Little Help from another person bathing (including washing, rinsing, drying)?: A Little Help from another person to put on and taking off regular upper body clothing?: A Little Help from another person to put on and taking off regular lower body clothing?: A Little 6 Click Score: 20    End of Session Equipment Utilized During Treatment: Rolling walker (2 wheels)  OT Visit Diagnosis: Unsteadiness on feet (R26.81)   Activity Tolerance Patient tolerated treatment well;Other (comment) (DOE 2/4 after household distance mobility)   Patient Left in chair;with call bell/phone within reach;with chair alarm set;with family/visitor present   Nurse Communication          Time: [phone] OT Time Calculation (min): 21 min  Charges: OT General Charges $OT Visit: 1 Visit OT Treatments $Self Care/Home Management : 8-22 mins  Tyrone Schimke, OT Acute Rehabilitation Services Pager: [phone] Office: [phone]   Hortencia Pilar 01/02/2021, 10:28 AM

## 2021-01-02 NOTE — Progress Notes (Addendum)
[place] for heparin >> apixaban Indication: hx atrial fibrillation   No Known Allergies  Patient Measurements: Height: 5\' 5"  (165.1 cm) Weight: 69.3 kg (152 lb 12.5 oz) IBW/kg (Calculated) : 61.5 Heparin Dosing Weight: 69 kg  Vital Signs: Temp: 97.9 F (36.6 C) (11/16 0544) Temp Source: Oral (11/16 0544) BP: 116/63 (11/16 0544) Pulse Rate: 75 (11/16 0544)  Labs: Recent Labs    12/31/20 0530 01/01/21 0346 01/01/21 1758 01/02/21 0218  HGB 7.2* 7.4*  --  8.0*  HCT 22.7* 24.3*  --  25.6*  PLT 192 184  --  177  APTT  --   --  27 57*  HEPARINUNFRC  --   --  >1.10* >1.10*  CREATININE 0.94 0.87  --  0.87     Estimated Creatinine Clearance: 67.7 mL/min (by C-G formula based on SCr of 0.87 mg/dL).   Medications:  - on Eliquis 5mg  bid (last dose taken on 12/31/20 at 1056)  Assessment: Patient is a 70 y.o M with lung cancer on chemotherapy and afib on Eliquis PTA, presented to the ED on 11/12 with SOB and lightheadedness. He now has staph aureus and staph epi bacteremia and being treated with IV abx.  He was taken to the OR on 11/15 for removal of port-a-cath. Discussed with TRH, ok to transition back to home Eliquis today, 11/16.  Goal of Therapy:  Monitor platelets by anticoagulation protocol: Yes   Plan:  -Discontinue heparin drip -Resume Eliquis 5mg  PO BID (dose ok for age, weight, Scr) -Give first dose of Eliquis at time of heparin discontinuation, discussed with RN  Dimple Nanas, PharmD 01/02/2021 11:29 AM

## 2021-01-02 NOTE — Progress Notes (Signed)
Physical Therapy Treatment Patient Details Name: Alan [name] MRN: [id number] DOB: 1950-05-02 Today's Date: 01/02/2021   History of Present Illness Patient is a 70 year old male who presented to the hosptial on 12/29/20 with shortness of breath and chronic cough. Pt was found to have acute respiratory failure with hypoxia, sepsis, a fib with RVR, AKI and bacteremia. PMH: metastatic lung cancer on chemo, HTN, A fib, and anxiety.    PT Comments    Pt with improved ambulation tolerance, completes 2 bouts ~100 ft with RW and 200 ft with RW with seated rest break between to recover. Pt on RA with SpO2 93% and dyspnea 3/4, educated on pursed lip breathing which pt reports difficulty due to stuffiness. Pt's spouse in room providing encouragement with mobility. Pt tolerates remaining up in recliner at EOS with all needs in reach. Continue to progress as able.   Recommendations for follow up therapy are one component of a multi-disciplinary discharge planning process, led by the attending physician.  Recommendations may be updated based on patient status, additional functional criteria and insurance authorization.  Follow Up Recommendations  No PT follow up     Assistance Recommended at Discharge None  Equipment Recommendations  None recommended by PT    Recommendations for Other Services       Precautions / Restrictions Precautions Precautions: Fall Precaution Comments: monitor O2 and HR Restrictions Weight Bearing Restrictions: No     Mobility  Bed Mobility Overal bed mobility: Needs Assistance Bed Mobility: Supine to Sit Supine to sit: Min guard  General bed mobility comments: increased time, light use of bedrail to upright trunk    Transfers Overall transfer level: Needs assistance Equipment used: Rolling walker (2 wheels) Transfers: Sit to/from Stand Sit to Stand: Min guard  General transfer comment: VC for hand placement, increased time to power to stand     Ambulation/Gait Ambulation/Gait assistance: Min guard;Supervision Gait Distance (Feet): 200 Feet (+ additional 100 ft) Assistive device: Rolling walker (2 wheels) Gait Pattern/deviations: Step-through pattern;Decreased stride length Gait velocity: slightly decreased  General Gait Details: steady, step through pattern using RW, dyspnea 3/4 on RA with SpO2 93-97%, VC for pursed lip breathing, no LOB, requires seated rest break between 2 ambulation bouts for recovery   Stairs             Wheelchair Mobility    Modified Rankin (Stroke Patients Only)       Balance Overall balance assessment: Mild deficits observed, not formally tested       Cognition Arousal/Alertness: Awake/alert Behavior During Therapy: WFL for tasks assessed/performed Overall Cognitive Status: Within Functional Limits for tasks assessed     Exercises      General Comments General comments (skin integrity, edema, etc.): dyspnea 3/4, HR 90s, SpO2 93% on RA      Pertinent Vitals/Pain Pain Assessment: No/denies pain    Home Living                          Prior Function            PT Goals (current goals can now be found in the care plan section) Acute Rehab PT Goals Patient Stated Goal: go home PT Goal Formulation: With patient/family Time For Goal Achievement: 01/14/21 Potential to Achieve Goals: Good Progress towards PT goals: Progressing toward goals    Frequency    Min 3X/week      PT Plan Current plan remains appropriate    Co-evaluation  AM-PAC PT "6 Clicks" Mobility   Outcome Measure  Help needed turning from your back to your side while in a flat bed without using bedrails?: None Help needed moving from lying on your back to sitting on the side of a flat bed without using bedrails?: A Little Help needed moving to and from a bed to a chair (including a wheelchair)?: A Little Help needed standing up from a chair using your arms (e.g.,  wheelchair or bedside chair)?: A Little Help needed to walk in hospital room?: A Little Help needed climbing 3-5 steps with a railing? : A Little 6 Click Score: 19    End of Session   Activity Tolerance: Patient tolerated treatment well Patient left: in chair;with call bell/phone within reach;with chair alarm set;with family/visitor present Nurse Communication: Mobility status (SpO2) PT Visit Diagnosis: Muscle weakness (generalized) (M62.81);Difficulty in walking, not elsewhere classified (R26.2)     Time: 9983-3825 PT Time Calculation (min) (ACUTE ONLY): 25 min  Charges:  $Gait Training: 23-37 mins                      Tori Nikolis Berent PT, DPT 01/02/21, 4:09 PM

## 2021-01-02 NOTE — Anesthesia Postprocedure Evaluation (Signed)
Anesthesia Post Note  Patient: Alan [name]  Procedure(s) Performed: REMOVAL PORT-A-CATH     Patient location during evaluation: PACU Anesthesia Type: General Level of consciousness: awake and alert Pain management: pain level controlled Vital Signs Assessment: post-procedure vital signs reviewed and stable Respiratory status: spontaneous breathing, nonlabored ventilation, respiratory function stable and patient connected to nasal cannula oxygen Cardiovascular status: blood pressure returned to baseline and stable Postop Assessment: no apparent nausea or vomiting Anesthetic complications: no   No notable events documented.  Last Vitals:  Vitals:   01/01/21 2101 01/02/21 0544  BP: 129/65 116/63  Pulse: 70 75  Resp: 16 18  Temp: 36.7 C 36.6 C  SpO2: 96% 95%    Last Pain:  Vitals:   01/02/21 0544  TempSrc: Oral  PainSc:    Pain Goal: Patients Stated Pain Goal: 0 (01/01/21 0930)                 Trevaughn Schear L Alnita Aybar

## 2021-01-02 NOTE — Progress Notes (Signed)
PROGRESS NOTE    Alan [name]  KGY:[id number] DOB: 09-17-1950 DOA: 12/29/2020 PCP: [facility]    No chief complaint on file.   Brief Narrative:  70 year old gentleman with prior history of metastatic lung cancer on active chemotherapy s/p right frontal craniotomy for metastatic tumor resection, paroxysmal atrial fibrillation on Eliquis, hypertension, hypothyroidism initially presented to [facility] for evaluation of shortness of breath.  Chest x-ray showed opacity in the left apex . patient was also found to be in atrial fibrillation underwent cardioversion in the ED and returned to sinus rhythm. Hospital course complicated by staph aureus bacteremia. ID consulted and recommendations given.  Cardiology consulted for TEE.  Assessment & Plan:   Principal Problem:   Acute respiratory failure with hypoxia (HCC) Active Problems:   Paroxysmal atrial fibrillation with RVR (HCC)   Lung cancer (HCC)   AKI (acute kidney injury) ([place])   Anemia associated with chemotherapy   Hypotension   Staphylococcus aureus bacteremia   Malignancy (HCC)   Bloodstream infection due to central venous catheter   Acute respiratory failure with hypoxia probably secondary to community-acquired pneumonia/chemotherapy pneumonitis. Patient initially required about 15 L of high flow nasal cannula oxygen currently weaned off and is patient is on room air at this time.  Complete the course of antibiotics. Taper prednisone in the next 2 weeks per PCCM. Continue with bronchodilators as needed, incentive spirometry and nasal cannula oxygen.    Sepsis secondary to staph RES/epidermidis bacteremia Present on admission. Repeat blood cultures negative and pending so far Infectious disease on board recommending TEE Cardiology consulted for TEE. Possible source Port-A-Cath which was removed by general surgery.    Paroxysmal atrial fibrillation with RVR s/p cardioversion in ED currently in  sinus rhythm On Eliquis for anticoagulation. Rate controlled by metoprolol.     Hypotension probably secondary to sepsis which has been resolved.     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 Follows up with Dr. Delton Coombes oncologist. Patient currently on active chemotherapy.   Hypothyroidism Continue with Synthroid.   Anemia of chronic disease/chemo associated anemia  Transfuse to keep hemoglobin greater than 7.    Anxiety Continue with Xanax.      Mild AKI probably secondary to sepsis Resolved.   DVT prophylaxis: Eliquis.  Code Status: (Full code) Family Communication: family at bedside.  Disposition:   Status is: Inpatient  Remains inpatient appropriate because: tee is pending.        Consultants:  Surgery ID.  Cardiology for TEE.   Procedures: none.   Antimicrobials:  Antibiotics Given (last 72 hours)     Date/Time Action Medication Dose Rate   12/31/20 0633 New Bag/Given   vancomycin (VANCOREADY) IVPB 1000 mg/200 mL 1,000 mg 200 mL/hr   12/31/20 0834 New Bag/Given   cefTRIAXone (ROCEPHIN) 1 g in sodium chloride 0.9 % 100 mL IVPB 1 g 200 mL/hr   12/31/20 1204 Given   azithromycin (ZITHROMAX) tablet 500 mg 500 mg    01/01/21 0263 New Bag/Given   vancomycin (VANCOREADY) IVPB 1250 mg/250 mL 1,250 mg 166.7 mL/hr   01/01/21 0948 Given   cefdinir (OMNICEF) capsule 300 mg 300 mg    01/01/21 0949 Given   azithromycin (ZITHROMAX) tablet 500 mg 500 mg    01/01/21 2144 Given   cefdinir (OMNICEF) capsule 300 mg 300 mg    01/02/21 0549 New Bag/Given   vancomycin (VANCOREADY) IVPB 1250 mg/250 mL 1,250 mg 166.7 mL/hr  01/02/21 0850 Given   azithromycin (ZITHROMAX) tablet 500 mg 500 mg    01/02/21 1761 Given   cefdinir (OMNICEF) capsule 300 mg 300 mg          Subjective: No chest pain or sob.   Objective: Vitals:   01/01/21 1450 01/01/21 2101 01/02/21 0500 01/02/21  0544  BP: 137/76 129/65  116/63  Pulse: 68 70  75  Resp: 18 16  18   Temp: 97.8 F (36.6 C) 98.1 F (36.7 C)  97.9 F (36.6 C)  TempSrc: Oral Oral  Oral  SpO2: 100% 96%  95%  Weight:   69.3 kg   Height:        Intake/Output Summary (Last 24 hours) at 01/02/2021 1428 Last data filed at 01/02/2021 0600 Gross per 24 hour  Intake 1331.51 ml  Output 1000 ml  Net 331.51 ml   Filed Weights   12/31/20 1417 01/01/21 0500 01/02/21 0500  Weight: 69.3 kg 69.3 kg 69.3 kg    Examination:  General exam: Appears calm and comfortable  Respiratory system: Clear to auscultation. Respiratory effort normal. Cardiovascular system: S1 & S2 heard, RRR. No JVD, . No pedal edema. Gastrointestinal system: Abdomen is nondistended, soft and nontender.  Normal bowel sounds heard. Central nervous system: Alert and oriented. No focal neurological deficits. Extremities: Symmetric 5 x 5 power. Skin: No rashes, lesions or ulcers Psychiatry:  Mood & affect appropriate.     Data Reviewed: I have personally reviewed following labs and imaging studies  CBC: Recent Labs  Lab 12/29/20 1011 12/30/20 0746 12/31/20 0530 01/01/21 0346 01/02/21 0218  WBC 5.5 7.2 10.2 12.4* 16.0*  NEUTROABS 3.8  --  8.2* 10.2* 12.4*  HGB 9.0* 7.3* 7.2* 7.4* 8.0*  HCT 27.4* 23.2* 22.7* 24.3* 25.6*  MCV 96.5 95.9 97.0 98.0 97.7  PLT 230 198 192 184 607    Basic Metabolic Panel: Recent Labs  Lab 12/29/20 1011 12/30/20 0746 12/31/20 0530 01/01/21 0346 01/02/21 0218  NA 134* 135 134* 134* 131*  K 3.9 4.3 4.0 4.6 4.6  CL 104 106 105 105 106  CO2 18* 20* 23 20* 20*  GLUCOSE 163* 142* 131* 212* 207*  BUN 42* 27* 24* 22 20  CREATININE 1.44* 0.98 0.94 0.87 0.87  CALCIUM 8.3* 8.1* 7.9* 8.2* 8.0*    GFR: Estimated Creatinine Clearance: 67.7 mL/min (by C-G formula based on SCr of 0.87 mg/dL).  Liver Function Tests: Recent Labs  Lab 12/29/20 1011  AST 24  ALT 21  ALKPHOS 53  BILITOT 1.1  PROT 6.3*  ALBUMIN  2.6*    CBG: Recent Labs  Lab 01/01/21 1328 01/01/21 1653 01/01/21 2058 01/02/21 0746 01/02/21 1135  GLUCAP 129* 144* 210* 143* 124*     Recent Results (from the past 240 hour(s))  Resp Panel by RT-PCR (Flu A&B, Covid) Nasopharyngeal Swab     Status: None   Collection Time: 12/29/20 10:12 AM   Specimen: Nasopharyngeal Swab; Nasopharyngeal(NP) swabs in vial transport medium  Result Value Ref Range Status   SARS Coronavirus 2 by RT PCR NEGATIVE NEGATIVE Final    Comment: (NOTE) SARS-CoV-2 target nucleic acids are NOT DETECTED.  The SARS-CoV-2 RNA is generally detectable in upper respiratory specimens during the acute phase of infection. The lowest concentration of SARS-CoV-2 viral copies this assay can detect is 138 copies/mL. A negative result does not preclude SARS-Cov-2 infection and should not be used as the sole basis for treatment or other patient management decisions. A negative result may occur with  improper specimen collection/handling, submission of specimen other than nasopharyngeal swab, presence of viral mutation(s) within the areas targeted by this assay, and inadequate number of viral copies(<138 copies/mL). A negative result must be combined with clinical observations, patient history, and epidemiological information. The expected result is Negative.  Fact Sheet for Patients:  EntrepreneurPulse.com.au  Fact Sheet for Healthcare Providers:  IncredibleEmployment.be  This test is no t yet approved or cleared by the [place] FDA and  has been authorized for detection and/or diagnosis of SARS-CoV-2 by FDA under an Emergency Use Authorization (EUA). This EUA will remain  in effect (meaning this test can be used) for the duration of the COVID-19 declaration under Section 564(b)(1) of the Act, 21 U.S.C.section 360bbb-3(b)(1), unless the authorization is terminated  or revoked sooner.       Influenza A by PCR NEGATIVE  NEGATIVE Final   Influenza B by PCR NEGATIVE NEGATIVE Final    Comment: (NOTE) The Xpert Xpress SARS-CoV-2/FLU/RSV plus assay is intended as an aid in the diagnosis of influenza from Nasopharyngeal swab specimens and should not be used as a sole basis for treatment. Nasal washings and aspirates are unacceptable for Xpert Xpress SARS-CoV-2/FLU/RSV testing.  Fact Sheet for Patients: EntrepreneurPulse.com.au  Fact Sheet for Healthcare Providers: IncredibleEmployment.be  This test is not yet approved or cleared by the [place] FDA and has been authorized for detection and/or diagnosis of SARS-CoV-2 by FDA under an Emergency Use Authorization (EUA). This EUA will remain in effect (meaning this test can be used) for the duration of the COVID-19 declaration under Section 564(b)(1) of the Act, 21 U.S.C. section 360bbb-3(b)(1), unless the authorization is terminated or revoked.  Performed at [facility], [address], [place], [place] [address]   Culture, blood (routine x 2)     Status: Abnormal   Collection Time: 12/29/20 10:38 AM   Specimen: BLOOD RIGHT HAND  Result Value Ref Range Status   Specimen Description   Final    BLOOD RIGHT HAND BOTTLES DRAWN AEROBIC AND ANAEROBIC Performed at [facility], [address], [place], [place] [address]    Special Requests   Final    Blood Culture adequate volume Performed at [facility] [place], [place] [address]    Culture  Setup Time   Final    GRAM POSITIVE COCCI IN BOTH AEROBIC AND ANAEROBIC BOTTLES Gram Stain Report Called to,Read Back By and Verified With: SIMPSON K. AT [facility] AT 2245 ON [id number] BY THOMPSON S. CRITICAL RESULT CALLED TO, READ BACK BY AND VERIFIED WITH: M LILLISTON,PHARMD@0450  12/30/20 [place] Performed at [facility], [place] [address], [place], [place] [address]    Culture (A)  Final    STAPHYLOCOCCUS AUREUS STAPHYLOCOCCUS EPIDERMIDIS    Report Status 01/02/2021  FINAL  Final   Organism ID, Bacteria STAPHYLOCOCCUS AUREUS  Final   Organism ID, Bacteria STAPHYLOCOCCUS EPIDERMIDIS  Final      Susceptibility   Staphylococcus aureus - MIC*    CIPROFLOXACIN <=0.5 SENSITIVE Sensitive     ERYTHROMYCIN <=0.25 SENSITIVE Sensitive     GENTAMICIN <=0.5 SENSITIVE Sensitive     OXACILLIN 0.5 SENSITIVE Sensitive     TETRACYCLINE <=1 SENSITIVE Sensitive     VANCOMYCIN <=0.5 SENSITIVE Sensitive     TRIMETH/SULFA <=10 SENSITIVE Sensitive     CLINDAMYCIN <=0.25 SENSITIVE Sensitive     RIFAMPIN <=0.5 SENSITIVE Sensitive     Inducible Clindamycin NEGATIVE Sensitive     * STAPHYLOCOCCUS AUREUS   Staphylococcus epidermidis - MIC*  CIPROFLOXACIN <=0.5 SENSITIVE Sensitive     ERYTHROMYCIN >=8 RESISTANT Resistant     GENTAMICIN <=0.5 SENSITIVE Sensitive     OXACILLIN >=4 RESISTANT Resistant     TETRACYCLINE >=16 RESISTANT Resistant     VANCOMYCIN 1 SENSITIVE Sensitive     TRIMETH/SULFA <=10 SENSITIVE Sensitive     CLINDAMYCIN RESISTANT Resistant     RIFAMPIN <=0.5 SENSITIVE Sensitive     Inducible Clindamycin POSITIVE Resistant     * STAPHYLOCOCCUS EPIDERMIDIS  Culture, blood (routine x 2)     Status: Abnormal   Collection Time: 12/29/20 10:38 AM   Specimen: Left Antecubital; Blood  Result Value Ref Range Status   Specimen Description   Final    LEFT ANTECUBITAL BOTTLES DRAWN AEROBIC ONLY Performed at [facility], [address], [place], [place] [address]    Special Requests   Final    Blood Culture adequate volume Performed at [facility], [address], [place], [place] [address]    Culture  Setup Time   Final    GRAM POSITIVE COCCI AEROBIC BOTTLE ONLY CRITICAL VALUE NOTED.  VALUE IS CONSISTENT WITH PREVIOUSLY REPORTED AND CALLED VALUE.    Culture (A)  Final    STAPHYLOCOCCUS EPIDERMIDIS SUSCEPTIBILITIES PERFORMED ON PREVIOUS CULTURE WITHIN THE LAST 5 DAYS. Performed at [facility], [place] [address] [place], [place] [address]    Report  Status 01/02/2021 FINAL  Final  Blood Culture ID Panel (Reflexed)     Status: Abnormal   Collection Time: 12/29/20 10:38 AM  Result Value Ref Range Status   Enterococcus faecalis NOT DETECTED NOT DETECTED Final   Enterococcus Faecium NOT DETECTED NOT DETECTED Final   Listeria monocytogenes NOT DETECTED NOT DETECTED Final   Staphylococcus species DETECTED (A) NOT DETECTED Final    Comment: CRITICAL RESULT CALLED TO, READ BACK BY AND VERIFIED WITH: M LILLISTON,PHARMD@0450  12/30/20 [place]    Staphylococcus aureus (BCID) DETECTED (A) NOT DETECTED Final    Comment: CRITICAL RESULT CALLED TO, READ BACK BY AND VERIFIED WITH: M LILLISTON,PHARMD@0450  12/30/20 [place]    Staphylococcus epidermidis DETECTED (A) NOT DETECTED Final    Comment: CRITICAL RESULT CALLED TO, READ BACK BY AND VERIFIED WITH: M LILLISTON,PHARMD@0453  12/30/20 [place]    Staphylococcus lugdunensis NOT DETECTED NOT DETECTED Final   Streptococcus species NOT DETECTED NOT DETECTED Final   Streptococcus agalactiae NOT DETECTED NOT DETECTED Final   Streptococcus pneumoniae NOT DETECTED NOT DETECTED Final   Streptococcus pyogenes NOT DETECTED NOT DETECTED Final   A.calcoaceticus-baumannii NOT DETECTED NOT DETECTED Final   Bacteroides fragilis NOT DETECTED NOT DETECTED Final   Enterobacterales NOT DETECTED NOT DETECTED Final   Enterobacter cloacae complex NOT DETECTED NOT DETECTED Final   Escherichia coli NOT DETECTED NOT DETECTED Final   Klebsiella aerogenes NOT DETECTED NOT DETECTED Final   Klebsiella oxytoca NOT DETECTED NOT DETECTED Final   Klebsiella pneumoniae NOT DETECTED NOT DETECTED Final   Proteus species NOT DETECTED NOT DETECTED Final   Salmonella species NOT DETECTED NOT DETECTED Final   Serratia marcescens NOT DETECTED NOT DETECTED Final   Haemophilus influenzae NOT DETECTED NOT DETECTED Final   Neisseria meningitidis NOT DETECTED NOT DETECTED Final   Pseudomonas aeruginosa NOT DETECTED NOT DETECTED Final   Stenotrophomonas  maltophilia NOT DETECTED NOT DETECTED Final   Candida albicans NOT DETECTED NOT DETECTED Final   Candida auris NOT DETECTED NOT DETECTED Final   Candida glabrata NOT DETECTED NOT DETECTED Final   Candida krusei NOT DETECTED NOT DETECTED Final   Candida parapsilosis  NOT DETECTED NOT DETECTED Final   Candida tropicalis NOT DETECTED NOT DETECTED Final   Cryptococcus neoformans/gattii NOT DETECTED NOT DETECTED Final   Methicillin resistance mecA/C DETECTED (A) NOT DETECTED Final    Comment: CRITICAL RESULT CALLED TO, READ BACK BY AND VERIFIED WITH: M LILLISTON,PHARMD@0453  12/30/20 [place]    Meth resistant mecA/C and MREJ NOT DETECTED NOT DETECTED Final    Comment: Performed at [facility], [address] [place], [place] [address]  MRSA Next Gen by PCR, Nasal     Status: None   Collection Time: 12/29/20 11:04 PM   Specimen: Nasal Mucosa; Nasal Swab  Result Value Ref Range Status   MRSA by PCR Next Gen NOT DETECTED NOT DETECTED Final    Comment: (NOTE) The GeneXpert MRSA Assay (FDA approved for NASAL specimens only), is one component of a comprehensive MRSA colonization surveillance program. It is not intended to diagnose MRSA infection nor to guide or monitor treatment for MRSA infections. Test performance is not FDA approved in patients less than 68 years old. Performed at [facility], [place] [address] [place], [place] [address]   Culture, blood (routine x 2)     Status: None (Preliminary result)   Collection Time: 12/30/20  3:34 PM   Specimen: BLOOD RIGHT HAND  Result Value Ref Range Status   Specimen Description   Final    BLOOD RIGHT HAND Performed at [facility] [address], [place], [place] [address]    Special Requests   Final    BOTTLES DRAWN AEROBIC ONLY Blood Culture results may not be optimal due to an inadequate volume of blood received in culture bottles Performed at [facility] [address]  [place], [place] [address]    Culture   Final    NO GROWTH 3 DAYS Performed at [facility], [place] [address], [place], [place] [address]    Report Status PENDING  Incomplete  Culture, blood (routine x 2)     Status: None (Preliminary result)   Collection Time: 12/30/20  3:34 PM   Specimen: BLOOD  Result Value Ref Range Status   Specimen Description   Final    BLOOD LEFT ANTECUBITAL Performed at [facility] [address], [place], [place] [address]    Special Requests   Final    BOTTLES DRAWN AEROBIC ONLY Blood Culture results may not be optimal due to an inadequate volume of blood received in culture bottles Performed at [facility] [address] [place], [place] [address]    Culture   Final    NO GROWTH 3 DAYS Performed at [facility], [place] [address] [place], [place] [address]    Report Status PENDING  Incomplete  Expectorated Sputum Assessment w Gram Stain, Rflx to Resp Cult     Status: None   Collection Time: 12/31/20  8:35 AM   Specimen: Expectorated Sputum  Result Value Ref Range Status   Specimen Description EXPECTORATED SPUTUM  Final   Special Requests NONE  Final   Sputum evaluation   Final    THIS SPECIMEN IS ACCEPTABLE FOR SPUTUM CULTURE Performed at [facility], [place] [address], [place], [place] [address]    Report Status 12/31/2020 FINAL  Final  Culture, Respiratory w Gram Stain     Status: None (Preliminary result)   Collection Time: 12/31/20  8:35 AM  Result Value Ref Range Status   Specimen Description  Final    EXPECTORATED SPUTUM Performed at [place] [facility] [address] [place], [place] [address]    Special Requests   Final    NONE Reflexed from [id number] Performed at [facility], [place] [address], [place], [place] [address]    Gram Stain   Final    RARE SQUAMOUS EPITHELIAL CELLS PRESENT RARE WBC PRESENT,BOTH PMN AND MONONUCLEAR NO ORGANISMS  SEEN    Culture   Final    RARE STAPHYLOCOCCUS AUREUS SUSCEPTIBILITIES TO FOLLOW Performed at [facility], [place] [address], [place], [place] [address]    Report Status PENDING  Incomplete  Aerobic/Anaerobic Culture w Gram Stain (surgical/deep wound)     Status: None (Preliminary result)   Collection Time: 01/01/21  1:17 PM   Specimen: PATH Other; Tissue  Result Value Ref Range Status   Specimen Description   Final    WOUND INFECTED INFUSION PORT SITE Performed at [facility], [place] [address] [place], [place] [address]    Special Requests   Final    NONE Performed at [facility], [place] [address], [place], [place] [address]    Gram Stain   Final    FEW WBC PRESENT, PREDOMINANTLY MONONUCLEAR NO ORGANISMS SEEN    Culture   Final    NO GROWTH < 12 HOURS Performed at [facility] [address] [place], [place] [address]    Report Status PENDING  Incomplete  Culture, blood (routine x 2)     Status: None (Preliminary result)   Collection Time: 01/02/21  2:18 AM   Specimen: BLOOD RIGHT HAND  Result Value Ref Range Status   Specimen Description   Final    BLOOD RIGHT HAND Performed at [facility] [address], [place], [place] [address]    Special Requests   Final    BOTTLES DRAWN AEROBIC ONLY Blood Culture adequate volume Performed at [facility] [address] [place], [place] [address]    Culture   Final    NO GROWTH < 12 HOURS Performed at [facility] [address], [place], [place] [address]    Report Status PENDING  Incomplete  Culture, blood (routine x 2)     Status: None (Preliminary result)   Collection Time: 01/02/21  2:18 AM   Specimen: Left Antecubital; Blood  Result Value Ref Range Status   Specimen Description   Final    LEFT ANTECUBITAL BLOOD Performed at [facility], [place] [address], [place], [place] [address]    Special Requests   Final     BOTTLES DRAWN AEROBIC ONLY Blood Culture results may not be optimal due to an inadequate volume of blood received in culture bottles Performed at [place] [facility] [address], [place], [place] [address]    Culture   Final    NO GROWTH < 12 HOURS Performed at [facility] [address], [place], [place] [address]    Report Status PENDING  Incomplete         Radiology Studies: No results found.      Scheduled Meds:  apixaban  5 mg Oral BID   cefdinir  300 mg Oral Q12H   feeding supplement  237 mL Oral TID BM   insulin aspart  0-5 Units Subcutaneous QHS   insulin aspart  0-9 Units Subcutaneous TID WC   ipratropium-albuterol  3 mL Nebulization BID   levothyroxine  100 mcg Oral QAC breakfast   metoprolol tartrate  25 mg Oral BID   miconazole nitrate   Topical TID   multivitamin with minerals  1 tablet Oral Daily   pantoprazole  40 mg Oral Daily   predniSONE  40 mg Oral Q breakfast   sodium chloride flush  3 mL Intravenous Q12H   Continuous Infusions:  sodium chloride Stopped (01/02/21 1232)   sodium chloride Stopped (12/31/20 1202)   vancomycin 1,250 mg (01/02/21 0549)     LOS: 4 days    Time spent: 37 minutes.     Hosie Poisson, MD [facility]   To contact the attending provider between 7A-7P or the covering provider during after hours 7P-7A, please log into the web site [email or web address] and access using universal [place] password for that web site. If you do not have the password, please call the hospital operator.  01/02/2021, 2:28 PM

## 2021-01-02 NOTE — Progress Notes (Signed)
ANTICOAGULATION CONSULT NOTE  Pharmacy Consult for heparin Indication: hx atrial fibrillation (PTA Eliquis on hold)  No Known Allergies  Patient Measurements: Height: 5\' 5"  (165.1 cm) Weight: 69.3 kg (152 lb 12.5 oz) IBW/kg (Calculated) : 61.5 Heparin Dosing Weight: 69 kg  Vital Signs: Temp: 98.1 F (36.7 C) (11/15 2101) Temp Source: Oral (11/15 2101) BP: 129/65 (11/15 2101) Pulse Rate: 70 (11/15 2101)  Labs: Recent Labs    12/31/20 0530 01/01/21 0346 01/01/21 1758 01/02/21 0218  HGB 7.2* 7.4*  --  8.0*  HCT 22.7* 24.3*  --  25.6*  PLT 192 184  --  177  APTT  --   --  27 57*  HEPARINUNFRC  --   --  >1.10* >1.10*  CREATININE 0.94 0.87  --  0.87     Estimated Creatinine Clearance: 67.7 mL/min (by C-G formula based on SCr of 0.87 mg/dL).   Medications:  - on Eliquis 5mg  bid (last dose taken on 12/31/20 at 1056)  Assessment: Patient is a 70 y.o M with lung cancer on chemotherapy and afib on Eliquis PTA, presented to the ED on 11/12 with SOB and lightheadedness. He now has staph aureus and staph epi bacteremia and being treated with IV abx.  He was taken to the OR on 11/15 for removal of port-a-cath. Pharmacy has been consulted to start heparin post-op on 11/15 while Eliquis is on hold.  01/02/2021 aPTT 57 subtherapeutic on 1000 units/hr HL >1.1 as expected with recent apixaban  Per RN no bleeding or line interruption   Goal of Therapy:  Heparin level 0.3-0.7 units/ml aPTT 66-102 seconds Monitor platelets by anticoagulation protocol: Yes   Plan:  - increase heparin drip to 1150 units/hr (no bolus per CCS's request) - check 8 hr aPTT level - monitor for s/sx bleeding  Dolly Rias RPh 01/02/2021, 2:51 AM

## 2021-01-02 NOTE — Progress Notes (Signed)
1 Day Post-Op  Subjective: CC: No complaints related to surgical site. Dressing in place. Patient sitting up in bed eating breakfast.   Objective: Vital signs in last 24 hours: Temp:  [97 F (36.1 C)-98.1 F (36.7 C)] 97.9 F (36.6 C) (11/16 0544) Pulse Rate:  [62-89] 75 (11/16 0544) Resp:  [16-24] 18 (11/16 0544) BP: (116-137)/(63-86) 116/63 (11/16 0544) SpO2:  [95 %-100 %] 95 % (11/16 0544) Weight:  [69.3 kg] 69.3 kg (11/16 0500) Last BM Date: 12/31/20  Intake/Output from previous day: 11/15 0701 - 11/16 0700 In: 1831.5 [P.O.:960; I.V.:623.2; IV Piggyback:248.3] Out: 1300 [Urine:1300] Intake/Output this shift: No intake/output data recorded.  PE: Gen: Awake and alert, NAD Lungs: Normal rate and effort Chest wall: Right upper chest dressing removed. Wic removed from incision. Incision clean and dry without drainage or surrounding skin changes. Dry dressing replaced.   Lab Results:  Recent Labs    01/01/21 0346 01/02/21 0218  WBC 12.4* 16.0*  HGB 7.4* 8.0*  HCT 24.3* 25.6*  PLT 184 177   BMET Recent Labs    01/01/21 0346 01/02/21 0218  NA 134* 131*  K 4.6 4.6  CL 105 106  CO2 20* 20*  GLUCOSE 212* 207*  BUN 22 20  CREATININE 0.87 0.87  CALCIUM 8.2* 8.0*   PT/INR No results for input(s): LABPROT, INR in the last 72 hours. CMP     Component Value Date/Time   NA 131 (L) 01/02/2021 0218   K 4.6 01/02/2021 0218   CL 106 01/02/2021 0218   CO2 20 (L) 01/02/2021 0218   GLUCOSE 207 (H) 01/02/2021 0218   BUN 20 01/02/2021 0218   CREATININE 0.87 01/02/2021 0218   CALCIUM 8.0 (L) 01/02/2021 0218   PROT 6.3 (L) 12/29/2020 1011   ALBUMIN 2.6 (L) 12/29/2020 1011   AST 24 12/29/2020 1011   ALT 21 12/29/2020 1011   ALKPHOS 53 12/29/2020 1011   BILITOT 1.1 12/29/2020 1011   GFRNONAA >60 01/02/2021 0218   GFRAA >60 11/01/2019 1049   Lipase  No results found for: LIPASE  Studies/Results: No results found.  Anti-infectives: Anti-infectives (From  admission, onward)    Start     Dose/Rate Route Frequency Ordered Stop   01/01/21 1000  cefdinir (OMNICEF) capsule 300 mg        300 mg Oral Every 12 hours 12/31/20 1058 01/03/21 0959   01/01/21 0600  vancomycin (VANCOREADY) IVPB 1250 mg/250 mL        1,250 mg 166.7 mL/hr over 90 Minutes Intravenous Every 24 hours 12/31/20 0804     12/31/20 1145  azithromycin (ZITHROMAX) tablet 500 mg        500 mg Oral Daily 12/31/20 1058 01/02/21 0850   12/31/20 0600  vancomycin (VANCOREADY) IVPB 1000 mg/200 mL  Status:  Discontinued        1,000 mg 200 mL/hr over 60 Minutes Intravenous Every 24 hours 12/30/20 0647 12/31/20 0804   12/30/20 0800  cefTRIAXone (ROCEPHIN) 1 g in sodium chloride 0.9 % 100 mL IVPB  Status:  Discontinued        1 g 200 mL/hr over 30 Minutes Intravenous Every 24 hours 12/29/20 1920 12/31/20 1058   12/30/20 0630  vancomycin (VANCOREADY) IVPB 1500 mg/300 mL        1,500 mg 150 mL/hr over 120 Minutes Intravenous  Once 12/30/20 0605 12/30/20 0844   12/29/20 1300  cefTRIAXone (ROCEPHIN) 1 g in sodium chloride 0.9 % 100 mL IVPB  1 g 200 mL/hr over 30 Minutes Intravenous  Once 12/29/20 1257 12/29/20 1422   12/29/20 1300  azithromycin (ZITHROMAX) 500 mg in sodium chloride 0.9 % 250 mL IVPB  Status:  Discontinued        500 mg 250 mL/hr over 60 Minutes Intravenous Every 24 hours 12/29/20 1257 12/31/20 1058        Assessment/Plan POD 1 s/p removal of infusion port for staphylococcus bacteremia with suspected infected infusion port - Dr. Harlow Asa - 01/01/2021 - Wic removed. Dry dressing replaced.  - Cx's per primary and ID - We will sign off. Please call back if we can be of any further assistance moving forward.   LOS: 4 days    Jillyn Ledger , [facility] 01/02/2021, 8:59 AM Please see Amion for pager number during day hours 7:00am-4:30pm

## 2021-01-03 ENCOUNTER — Encounter (HOSPITAL_COMMUNITY): Payer: Medicare Other | Admitting: Physical Therapy

## 2021-01-03 LAB — BASIC METABOLIC PANEL
Anion gap: 5 (ref 5–15)
BUN: 24 mg/dL — ABNORMAL HIGH (ref 8–23)
CO2: 23 mmol/L (ref 22–32)
Calcium: 8.2 mg/dL — ABNORMAL LOW (ref 8.9–10.3)
Chloride: 104 mmol/L (ref 98–111)
Creatinine, Ser: 0.94 mg/dL (ref 0.61–1.24)
GFR, Estimated: >60 mL/min (ref >60)
Glucose, Bld: 144 mg/dL — ABNORMAL HIGH (ref 70–99)
Potassium: 5.1 mmol/L (ref 3.5–5.1)
Sodium: 132 mmol/L — ABNORMAL LOW (ref 135–145)

## 2021-01-03 LAB — CBC WITH DIFFERENTIAL/PLATELET
Abs Immature Granulocytes: 3.43 10*3/uL — ABNORMAL HIGH (ref 0.00–0.07)
Basophils Absolute: 0.1 10*3/uL (ref 0.0–0.1)
Basophils Relative: 1 %
Eosinophils Absolute: 0 10*3/uL (ref 0.0–0.5)
Eosinophils Relative: 0 %
HCT: 26.8 % — ABNORMAL LOW (ref 39.0–52.0)
Hemoglobin: 8.4 g/dL — ABNORMAL LOW (ref 13.0–17.0)
Immature Granulocytes: 16 %
Lymphocytes Relative: 4 %
Lymphs Abs: 0.8 10*3/uL (ref 0.7–4.0)
MCH: 30.5 pg (ref 26.0–34.0)
MCHC: 31.3 g/dL (ref 30.0–36.0)
MCV: 97.5 fL (ref 80.0–100.0)
Monocytes Absolute: 1.3 10*3/uL — ABNORMAL HIGH (ref 0.1–1.0)
Monocytes Relative: 6 %
Neutro Abs: 15.9 10*3/uL — ABNORMAL HIGH (ref 1.7–7.7)
Neutrophils Relative %: 73 %
Platelets: 190 10*3/uL (ref 150–400)
RBC: 2.75 MIL/uL — ABNORMAL LOW (ref 4.22–5.81)
RDW: 16.8 % — ABNORMAL HIGH (ref 11.5–15.5)
WBC: 21.6 10*3/uL — ABNORMAL HIGH (ref 4.0–10.5)
nRBC: 5.1 % — ABNORMAL HIGH (ref 0.0–0.2)

## 2021-01-03 LAB — CULTURE, RESPIRATORY W GRAM STAIN

## 2021-01-03 LAB — GLUCOSE, CAPILLARY
Glucose-Capillary: 105 mg/dL — ABNORMAL HIGH (ref 70–99)
Glucose-Capillary: 147 mg/dL — ABNORMAL HIGH (ref 70–99)
Glucose-Capillary: 166 mg/dL — ABNORMAL HIGH (ref 70–99)
Glucose-Capillary: 178 mg/dL — ABNORMAL HIGH (ref 70–99)

## 2021-01-03 NOTE — Care Management Important Message (Signed)
Important Message  Patient Details IM Letter placed in Patients room for Spouse Kallie Locks. Name: NAJI MEHRINGER MRN: [id number] Date of Birth: January 18, 1951   Medicare Important Message Given:  Yes     Kerin Salen 01/03/2021, 10:59 AM

## 2021-01-03 NOTE — Progress Notes (Signed)
Pharmacy Antibiotic Note  Alan [name] is a 70 y.o. male admitted on 12/29/2020 with Staph Aureus bacteremia. Pharmacy has been consulted for Vancomycin dosing.  SCr improved to 0.94, will adjust Vancomycin dose today.  Plan: - Continue Vancomycin 1250 mg IV every 24 hours (eAUC 442, SCr 0.94, Vd 0.72)  - Plan is to treat with vancomycin through 11/20 (see ID recommendations), followed by Ancef for MSSA   Height: 5\' 5"  (165.1 cm) Weight: 70.6 kg (155 lb 10.3 oz) IBW/kg (Calculated) : 61.5  Temp (24hrs), Avg:98.9 F (37.2 C), Min:98.2 F (36.8 C), Max:99.7 F (37.6 C)  Recent Labs  Lab 12/29/20 1011 12/29/20 1207 12/30/20 0746 12/31/20 0530 01/01/21 0346 01/02/21 0218 01/03/21 0335  WBC 5.5  --  7.2 10.2 12.4* 16.0* 21.6*  CREATININE 1.44*  --  0.98 0.94 0.87 0.87 0.94  LATICACIDVEN 2.4* 2.3*  --  1.5  --   --   --      Estimated Creatinine Clearance: 62.7 mL/min (by C-G formula based on SCr of 0.94 mg/dL).    No Known Allergies  Antimicrobials this admission: 11/12 CTX >> 11/14 11/15 cefdinir >> (11/16) 11/12 Azith >> (11/16) 11/13 Vanc >>   Microbiology results: 11/12 Resp panel: covid neg; influenza neg 11/12 MRSA PCR: not detected 11/12 BCx: 3/4 MSSA, MRSE 11/13 BCx: NGTD 11/14 Sputum: staph aureus 11/15 Port Cx: ngtd 11/16 repeat BCx: ngtd  Thank you for allowing pharmacy to be a part of this patient's care.  Reuel Boom, PharmD, BCPS [phone] 01/03/2021, 9:52 AM

## 2021-01-03 NOTE — Progress Notes (Signed)
PROGRESS NOTE    Alan [name]  EGB:[id number] DOB: 21-Jun-1950 DOA: 12/29/2020 PCP: Health, [facility]    No chief complaint on file.   Brief Narrative:  70 year old gentleman with prior history of metastatic lung cancer on active chemotherapy s/p right frontal craniotomy for metastatic tumor resection, paroxysmal atrial fibrillation on Eliquis, hypertension, hypothyroidism initially presented to [facility] for evaluation of shortness of breath.  Chest x-ray showed opacity in the left apex . patient was also found to be in atrial fibrillation underwent cardioversion in the ED and returned to sinus rhythm. Hospital course complicated by staph aureus bacteremia. ID consulted and recommendations given.  Cardiology consulted for TEE.  Assessment & Plan:   Principal Problem:   Acute respiratory failure with hypoxia (HCC) Active Problems:   Paroxysmal atrial fibrillation with RVR (HCC)   Lung cancer (HCC)   AKI (acute kidney injury) ([place])   Anemia associated with chemotherapy   Hypotension   Staphylococcus aureus bacteremia   Malignancy (HCC)   Bloodstream infection due to central venous catheter   Acute respiratory failure with hypoxia probably secondary to community-acquired pneumonia/chemotherapy pneumonitis. Patient initially required about 15 L of high flow nasal cannula oxygen currently weaned off and is patient is on room air at this time.  Complete the course of antibiotics. Currently on IV vancomycin, to complete the course on 01/06/21, then switch to cefazolin to complete the MSSA treatment.  Taper prednisone in the next 2 weeks per PCCM. Continue with bronchodilators as needed, incentive spirometry and nasal cannula oxygen.   Sepsis secondary to staph /epidermidis bacteremia Present on admission. Repeat blood cultures negative and pending so far Infectious disease on board recommending TEE Cardiology consulted for TEE, scheduled for tomorrow.  Low grade  temp earlier this am and worsening leukocytosis . Worsening leukocytosis possibly from steroids.  Possible source Port-A-Cath which was removed by general surgery.    Paroxysmal atrial fibrillation with RVR s/p cardioversion in ED currently in sinus rhythm On Eliquis for anticoagulation. Rate controlled by metoprolol 25 mg BID.    Hypotension probably secondary to sepsis which has been resolved.   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 Follows up with Dr. Delton Coombes oncologist. Patient currently on active chemotherapy.   Hypothyroidism Continue with Synthroid.   Anemia of chronic disease/chemo associated anemia Transfuse to keep hemoglobin greater than 7. Hemoglobin stable around 8.    Anxiety Continue with Xanax.      Mild AKI probably secondary to sepsis Resolved.   Mild hyponatremia:  Sodium is 132.    DVT prophylaxis: Eliquis.  Code Status: (Full code) Family Communication: family at bedside.  Disposition:   Status is: Inpatient  Remains inpatient appropriate because: tee is pending.        Consultants:  Surgery ID.  Cardiology for TEE.   Procedures: none.   Antimicrobials:  Antibiotics Given (last 72 hours)     Date/Time Action Medication Dose Rate   01/01/21 0632 New Bag/Given   vancomycin (VANCOREADY) IVPB 1250 mg/250 mL 1,250 mg 166.7 mL/hr   01/01/21 0948 Given   cefdinir (OMNICEF) capsule 300 mg 300 mg    01/01/21 0949 Given   azithromycin (ZITHROMAX) tablet 500 mg 500 mg    01/01/21 2144 Given   cefdinir (OMNICEF) capsule 300 mg 300 mg    01/02/21 0549 New Bag/Given   vancomycin (VANCOREADY) IVPB 1250 mg/250 mL 1,250 mg 166.7 mL/hr   01/02/21 0850 Given   azithromycin (  ZITHROMAX) tablet 500 mg 500 mg    01/02/21 0851 Given   cefdinir (OMNICEF) capsule 300 mg 300 mg    01/02/21 2108 Given   cefdinir (OMNICEF) capsule 300 mg 300 mg    01/03/21 0539 New  Bag/Given   vancomycin (VANCOREADY) IVPB 1250 mg/250 mL 1,250 mg 166.7 mL/hr         Subjective: No new complaints.   Objective: Vitals:   01/02/21 1525 01/02/21 2023 01/03/21 0500 01/03/21 0734  BP: 123/69 120/69  125/78  Pulse: 67 65  (!) 59  Resp: 20 18  16   Temp: 98.2 F (36.8 C) 98.8 F (37.1 C)  99.7 F (37.6 C)  TempSrc: Oral Oral  Oral  SpO2: 95% 96%  96%  Weight:   70.6 kg   Height:        Intake/Output Summary (Last 24 hours) at 01/03/2021 1512 Last data filed at 01/03/2021 0500 Gross per 24 hour  Intake 1011.88 ml  Output 700 ml  Net 311.88 ml    Filed Weights   01/01/21 0500 01/02/21 0500 01/03/21 0500  Weight: 69.3 kg 69.3 kg 70.6 kg    Examination:  General exam: Appears calm and comfortable  Respiratory system: Clear to auscultation. Respiratory effort normal. Cardiovascular system: S1 & S2 heard, RRR. No JVD,  No pedal edema. Gastrointestinal system: Abdomen is nondistended, soft and nontender.  Normal bowel sounds heard. Central nervous system: Alert and oriented. No focal neurological deficits. Extremities: Symmetric 5 x 5 power. Skin: No rashes, lesions or ulcers Psychiatry: Mood & affect appropriate.      Data Reviewed: I have personally reviewed following labs and imaging studies  CBC: Recent Labs  Lab 12/29/20 1011 12/30/20 0746 12/31/20 0530 01/01/21 0346 01/02/21 0218 01/03/21 0335  WBC 5.5 7.2 10.2 12.4* 16.0* 21.6*  NEUTROABS 3.8  --  8.2* 10.2* 12.4* 15.9*  HGB 9.0* 7.3* 7.2* 7.4* 8.0* 8.4*  HCT 27.4* 23.2* 22.7* 24.3* 25.6* 26.8*  MCV 96.5 95.9 97.0 98.0 97.7 97.5  PLT 230 198 192 184 177 190     Basic Metabolic Panel: Recent Labs  Lab 12/30/20 0746 12/31/20 0530 01/01/21 0346 01/02/21 0218 01/03/21 0335  NA 135 134* 134* 131* 132*  K 4.3 4.0 4.6 4.6 5.1  CL 106 105 105 106 104  CO2 20* 23 20* 20* 23  GLUCOSE 142* 131* 212* 207* 144*  BUN 27* 24* 22 20 24*  CREATININE 0.98 0.94 0.87 0.87 0.94  CALCIUM  8.1* 7.9* 8.2* 8.0* 8.2*     GFR: Estimated Creatinine Clearance: 62.7 mL/min (by C-G formula based on SCr of 0.94 mg/dL).  Liver Function Tests: Recent Labs  Lab 12/29/20 1011  AST 24  ALT 21  ALKPHOS 53  BILITOT 1.1  PROT 6.3*  ALBUMIN 2.6*     CBG: Recent Labs  Lab 01/02/21 1135 01/02/21 1648 01/02/21 2151 01/03/21 0814 01/03/21 1223  GLUCAP 124* 135* 161* 105* 147*      Recent Results (from the past 240 hour(s))  Resp Panel by RT-PCR (Flu A&B, Covid) Nasopharyngeal Swab     Status: None   Collection Time: 12/29/20 10:12 AM   Specimen: Nasopharyngeal Swab; Nasopharyngeal(NP) swabs in vial transport medium  Result Value Ref Range Status   SARS Coronavirus 2 by RT PCR NEGATIVE NEGATIVE Final    Comment: (NOTE) SARS-CoV-2 target nucleic acids are NOT DETECTED.  The SARS-CoV-2 RNA is generally detectable in upper respiratory specimens during the acute phase of infection. The lowest concentration of SARS-CoV-2  viral copies this assay can detect is 138 copies/mL. A negative result does not preclude SARS-Cov-2 infection and should not be used as the sole basis for treatment or other patient management decisions. A negative result may occur with  improper specimen collection/handling, submission of specimen other than nasopharyngeal swab, presence of viral mutation(s) within the areas targeted by this assay, and inadequate number of viral copies(<138 copies/mL). A negative result must be combined with clinical observations, patient history, and epidemiological information. The expected result is Negative.  Fact Sheet for Patients:  EntrepreneurPulse.com.au  Fact Sheet for Healthcare Providers:  IncredibleEmployment.be  This test is no t yet approved or cleared by the [place] FDA and  has been authorized for detection and/or diagnosis of SARS-CoV-2 by FDA under an Emergency Use Authorization (EUA). This EUA will remain   in effect (meaning this test can be used) for the duration of the COVID-19 declaration under Section 564(b)(1) of the Act, 21 U.S.C.section 360bbb-3(b)(1), unless the authorization is terminated  or revoked sooner.       Influenza A by PCR NEGATIVE NEGATIVE Final   Influenza B by PCR NEGATIVE NEGATIVE Final    Comment: (NOTE) The Xpert Xpress SARS-CoV-2/FLU/RSV plus assay is intended as an aid in the diagnosis of influenza from Nasopharyngeal swab specimens and should not be used as a sole basis for treatment. Nasal washings and aspirates are unacceptable for Xpert Xpress SARS-CoV-2/FLU/RSV testing.  Fact Sheet for Patients: EntrepreneurPulse.com.au  Fact Sheet for Healthcare Providers: IncredibleEmployment.be  This test is not yet approved or cleared by the [place] FDA and has been authorized for detection and/or diagnosis of SARS-CoV-2 by FDA under an Emergency Use Authorization (EUA). This EUA will remain in effect (meaning this test can be used) for the duration of the COVID-19 declaration under Section 564(b)(1) of the Act, 21 U.S.C. section 360bbb-3(b)(1), unless the authorization is terminated or revoked.  Performed at [facility], [address], [place], [place] [address]   Culture, blood (routine x 2)     Status: Abnormal   Collection Time: 12/29/20 10:38 AM   Specimen: BLOOD RIGHT HAND  Result Value Ref Range Status   Specimen Description   Final    BLOOD RIGHT HAND BOTTLES DRAWN AEROBIC AND ANAEROBIC Performed at [facility], [address] [place], [place] [address]    Special Requests   Final    Blood Culture adequate volume Performed at [place]., [place], [place] [address]    Culture  Setup Time   Final    GRAM POSITIVE COCCI IN BOTH AEROBIC AND ANAEROBIC BOTTLES Gram Stain Report Called to,Read Back By and Verified With: SIMPSON K. AT [facility] AT 2245 ON [id number] BY THOMPSON S. CRITICAL RESULT CALLED  TO, READ BACK BY AND VERIFIED WITH: M LILLISTON,PHARMD@0450  12/30/20 [place] Performed at [facility], [place] [address], [place], [place] [address]    Culture (A)  Final    STAPHYLOCOCCUS AUREUS STAPHYLOCOCCUS EPIDERMIDIS    Report Status 01/02/2021 FINAL  Final   Organism ID, Bacteria STAPHYLOCOCCUS AUREUS  Final   Organism ID, Bacteria STAPHYLOCOCCUS EPIDERMIDIS  Final      Susceptibility   Staphylococcus aureus - MIC*    CIPROFLOXACIN <=0.5 SENSITIVE Sensitive     ERYTHROMYCIN <=0.25 SENSITIVE Sensitive     GENTAMICIN <=0.5 SENSITIVE Sensitive     OXACILLIN 0.5 SENSITIVE Sensitive     TETRACYCLINE <=1 SENSITIVE Sensitive     VANCOMYCIN <=0.5 SENSITIVE Sensitive     TRIMETH/SULFA <=  10 SENSITIVE Sensitive     CLINDAMYCIN <=0.25 SENSITIVE Sensitive     RIFAMPIN <=0.5 SENSITIVE Sensitive     Inducible Clindamycin NEGATIVE Sensitive     * STAPHYLOCOCCUS AUREUS   Staphylococcus epidermidis - MIC*    CIPROFLOXACIN <=0.5 SENSITIVE Sensitive     ERYTHROMYCIN >=8 RESISTANT Resistant     GENTAMICIN <=0.5 SENSITIVE Sensitive     OXACILLIN >=4 RESISTANT Resistant     TETRACYCLINE >=16 RESISTANT Resistant     VANCOMYCIN 1 SENSITIVE Sensitive     TRIMETH/SULFA <=10 SENSITIVE Sensitive     CLINDAMYCIN RESISTANT Resistant     RIFAMPIN <=0.5 SENSITIVE Sensitive     Inducible Clindamycin POSITIVE Resistant     * STAPHYLOCOCCUS EPIDERMIDIS  Culture, blood (routine x 2)     Status: Abnormal (Preliminary result)   Collection Time: 12/29/20 10:38 AM   Specimen: Left Antecubital; Blood  Result Value Ref Range Status   Specimen Description   Final    LEFT ANTECUBITAL BOTTLES DRAWN AEROBIC ONLY Performed at [facility], [address], [place], [place] [address]    Special Requests   Final    Blood Culture adequate volume Performed at [facility], [address], [place], [place] [address]    Culture  Setup Time   Final    GRAM POSITIVE COCCI AEROBIC BOTTLE ONLY CRITICAL VALUE NOTED.   VALUE IS CONSISTENT WITH PREVIOUSLY REPORTED AND CALLED VALUE.    Culture (A)  Final    STAPHYLOCOCCUS EPIDERMIDIS CULTURE REINCUBATED FOR BETTER GROWTH Performed at [facility], [place] [address] [place], [place] [address]    Report Status PENDING  Incomplete  Blood Culture ID Panel (Reflexed)     Status: Abnormal   Collection Time: 12/29/20 10:38 AM  Result Value Ref Range Status   Enterococcus faecalis NOT DETECTED NOT DETECTED Final   Enterococcus Faecium NOT DETECTED NOT DETECTED Final   Listeria monocytogenes NOT DETECTED NOT DETECTED Final   Staphylococcus species DETECTED (A) NOT DETECTED Final    Comment: CRITICAL RESULT CALLED TO, READ BACK BY AND VERIFIED WITH: M LILLISTON,PHARMD@0450  12/30/20 [place]    Staphylococcus aureus (BCID) DETECTED (A) NOT DETECTED Final    Comment: CRITICAL RESULT CALLED TO, READ BACK BY AND VERIFIED WITH: M LILLISTON,PHARMD@0450  12/30/20 [place]    Staphylococcus epidermidis DETECTED (A) NOT DETECTED Final    Comment: CRITICAL RESULT CALLED TO, READ BACK BY AND VERIFIED WITH: M LILLISTON,PHARMD@0453  12/30/20 [place]    Staphylococcus lugdunensis NOT DETECTED NOT DETECTED Final   Streptococcus species NOT DETECTED NOT DETECTED Final   Streptococcus agalactiae NOT DETECTED NOT DETECTED Final   Streptococcus pneumoniae NOT DETECTED NOT DETECTED Final   Streptococcus pyogenes NOT DETECTED NOT DETECTED Final   A.calcoaceticus-baumannii NOT DETECTED NOT DETECTED Final   Bacteroides fragilis NOT DETECTED NOT DETECTED Final   Enterobacterales NOT DETECTED NOT DETECTED Final   Enterobacter cloacae complex NOT DETECTED NOT DETECTED Final   Escherichia coli NOT DETECTED NOT DETECTED Final   Klebsiella aerogenes NOT DETECTED NOT DETECTED Final   Klebsiella oxytoca NOT DETECTED NOT DETECTED Final   Klebsiella pneumoniae NOT DETECTED NOT DETECTED Final   Proteus species NOT DETECTED NOT DETECTED Final   Salmonella species NOT DETECTED NOT DETECTED Final    Serratia marcescens NOT DETECTED NOT DETECTED Final   Haemophilus influenzae NOT DETECTED NOT DETECTED Final   Neisseria meningitidis NOT DETECTED NOT DETECTED Final   Pseudomonas aeruginosa NOT DETECTED NOT DETECTED Final   Stenotrophomonas maltophilia NOT DETECTED NOT DETECTED Final  Candida albicans NOT DETECTED NOT DETECTED Final   Candida auris NOT DETECTED NOT DETECTED Final   Candida glabrata NOT DETECTED NOT DETECTED Final   Candida krusei NOT DETECTED NOT DETECTED Final   Candida parapsilosis NOT DETECTED NOT DETECTED Final   Candida tropicalis NOT DETECTED NOT DETECTED Final   Cryptococcus neoformans/gattii NOT DETECTED NOT DETECTED Final   Methicillin resistance mecA/C DETECTED (A) NOT DETECTED Final    Comment: CRITICAL RESULT CALLED TO, READ BACK BY AND VERIFIED WITH: M LILLISTON,PHARMD@0453  12/30/20 [place]    Meth resistant mecA/C and MREJ NOT DETECTED NOT DETECTED Final    Comment: Performed at [facility], [address] [place], [place] [address]  MRSA Next Gen by PCR, Nasal     Status: None   Collection Time: 12/29/20 11:04 PM   Specimen: Nasal Mucosa; Nasal Swab  Result Value Ref Range Status   MRSA by PCR Next Gen NOT DETECTED NOT DETECTED Final    Comment: (NOTE) The GeneXpert MRSA Assay (FDA approved for NASAL specimens only), is one component of a comprehensive MRSA colonization surveillance program. It is not intended to diagnose MRSA infection nor to guide or monitor treatment for MRSA infections. Test performance is not FDA approved in patients less than 30 years old. Performed at [facility], [place] [address], [place], [place] [address]   Culture, blood (routine x 2)     Status: None (Preliminary result)   Collection Time: 12/30/20  3:34 PM   Specimen: BLOOD RIGHT HAND  Result Value Ref Range Status   Specimen Description   Final    BLOOD RIGHT HAND Performed at [place] [address], [place],  [place] [address]    Special Requests   Final    BOTTLES DRAWN AEROBIC ONLY Blood Culture results may not be optimal due to an inadequate volume of blood received in culture bottles Performed at [place] [address] [place], [place] [address]    Culture   Final    NO GROWTH 3 DAYS Performed at [facility], [place] [address], [place], [place] [address]    Report Status PENDING  Incomplete  Culture, blood (routine x 2)     Status: None (Preliminary result)   Collection Time: 12/30/20  3:34 PM   Specimen: BLOOD  Result Value Ref Range Status   Specimen Description   Final    BLOOD LEFT ANTECUBITAL Performed at [place] [address] [place], [place] [address]    Special Requests   Final    BOTTLES DRAWN AEROBIC ONLY Blood Culture results may not be optimal due to an inadequate volume of blood received in culture bottles Performed at [place] [address] [place], [place] [address]    Culture   Final    NO GROWTH 3 DAYS Performed at [facility], [place] [address], [place], [place] [address]    Report Status PENDING  Incomplete  Expectorated Sputum Assessment w Gram Stain, Rflx to Resp Cult     Status: None   Collection Time: 12/31/20  8:35 AM   Specimen: Expectorated Sputum  Result Value Ref Range Status   Specimen Description EXPECTORATED SPUTUM  Final   Special Requests NONE  Final   Sputum evaluation   Final    THIS SPECIMEN IS ACCEPTABLE FOR SPUTUM CULTURE Performed at [facility], [place] [address], [place], [place] [address]    Report Status  12/31/2020 FINAL  Final  Culture, Respiratory w Gram Stain     Status: None   Collection Time: 12/31/20  8:35 AM  Result Value Ref Range Status   Specimen Description   Final    EXPECTORATED SPUTUM Performed at [facility], [place] [address], [place], [place] [address]    Special Requests   Final    NONE Reflexed from  [id number] Performed at [facility], [place] [address] [place], [place] [address]    Gram Stain   Final    RARE SQUAMOUS EPITHELIAL CELLS PRESENT RARE WBC PRESENT,BOTH PMN AND MONONUCLEAR NO ORGANISMS SEEN Performed at [facility], [place] [address] [place], [place] [address]    Culture RARE STAPHYLOCOCCUS AUREUS  Final   Report Status 01/03/2021 FINAL  Final   Organism ID, Bacteria STAPHYLOCOCCUS AUREUS  Final      Susceptibility   Staphylococcus aureus - MIC*    CIPROFLOXACIN <=0.5 SENSITIVE Sensitive     ERYTHROMYCIN <=0.25 SENSITIVE Sensitive     GENTAMICIN <=0.5 SENSITIVE Sensitive     OXACILLIN 0.5 SENSITIVE Sensitive     TETRACYCLINE <=1 SENSITIVE Sensitive     VANCOMYCIN <=0.5 SENSITIVE Sensitive     TRIMETH/SULFA <=10 SENSITIVE Sensitive     CLINDAMYCIN <=0.25 SENSITIVE Sensitive     RIFAMPIN <=0.5 SENSITIVE Sensitive     Inducible Clindamycin NEGATIVE Sensitive     * RARE STAPHYLOCOCCUS AUREUS  Aerobic/Anaerobic Culture w Gram Stain (surgical/deep wound)     Status: None (Preliminary result)   Collection Time: 01/01/21  1:17 PM   Specimen: PATH Other; Tissue  Result Value Ref Range Status   Specimen Description   Final    WOUND INFECTED INFUSION PORT SITE Performed at [place] [address] [place], [place] [address]    Special Requests   Final    NONE Performed at [facility], [place] [address] [place], [place] [address]    Gram Stain   Final    FEW WBC PRESENT, PREDOMINANTLY MONONUCLEAR NO ORGANISMS SEEN    Culture   Final    NO GROWTH 2 DAYS NO ANAEROBES ISOLATED; CULTURE IN PROGRESS FOR 5 DAYS Performed at [place] [address], [place], [place] [address]    Report Status PENDING  Incomplete  Culture, blood (routine x 2)     Status: None (Preliminary result)   Collection Time: 01/02/21  2:18 AM   Specimen: BLOOD RIGHT HAND  Result Value Ref Range Status   Specimen Description    Final    BLOOD RIGHT HAND Performed at [place] [address] [place], [place] [address]    Special Requests   Final    BOTTLES DRAWN AEROBIC ONLY Blood Culture adequate volume Performed at [place] [address], [place], [place] [address]    Culture   Final    NO GROWTH < 12 HOURS Performed at [place] [address], [place], [place] [address]    Report Status PENDING  Incomplete  Culture, blood (routine x 2)     Status: None (Preliminary result)   Collection Time: 01/02/21  2:18 AM   Specimen: Left Antecubital; Blood  Result Value Ref Range Status   Specimen Description   Final    LEFT ANTECUBITAL BLOOD Performed at [facility], [place] [address], [place],  [address]    Special Requests   Final    BOTTLES  DRAWN AEROBIC ONLY Blood Culture results may not be optimal due to an inadequate volume of blood received in culture bottles Performed at [facility], [place] [address] [place], [place] [address]    Culture   Final    NO GROWTH < 12 HOURS Performed at [place] [address] [place], [place] [address]    Report Status PENDING  Incomplete          Radiology Studies: No results found.      Scheduled Meds:  apixaban  5 mg Oral BID   feeding supplement  237 mL Oral TID BM   insulin aspart  0-5 Units Subcutaneous QHS   insulin aspart  0-9 Units Subcutaneous TID WC   levothyroxine  100 mcg Oral QAC breakfast   metoprolol tartrate  25 mg Oral BID   miconazole nitrate   Topical TID   multivitamin with minerals  1 tablet Oral Daily   pantoprazole  40 mg Oral Daily   predniSONE  40 mg Oral Q breakfast   sodium chloride flush  3 mL Intravenous Q12H   Continuous Infusions:  sodium chloride Stopped (01/02/21 1232)   sodium chloride Stopped (12/31/20 1202)   vancomycin 1,250 mg (01/03/21 0539)     LOS: 5 days       Hosie Poisson, MD [facility]   To contact the attending provider between 7A-7P or the covering provider during after hours 7P-7A, please log into the web site [email or web address] and access using universal [place] password for that web site. If you do not have the password, please call the hospital operator.  01/03/2021, 3:12 PM

## 2021-01-03 NOTE — Plan of Care (Signed)
  Problem: Education: Goal: Knowledge of General Education information will improve Description Including pain rating scale, medication(s)/side effects and non-pharmacologic comfort measures Outcome: Progressing   Problem: Health Behavior/Discharge Planning: Goal: Ability to manage health-related needs will improve Outcome: Progressing   

## 2021-01-03 NOTE — H&P (View-Only) (Signed)
PROGRESS NOTE    Alan [name]  JWJ:[id number] DOB: 1950-07-19 DOA: 12/29/2020 PCP: [facility]    No chief complaint on file.   Brief Narrative:  70 year old gentleman with prior history of metastatic lung cancer on active chemotherapy s/p right frontal craniotomy for metastatic tumor resection, paroxysmal atrial fibrillation on Eliquis, hypertension, hypothyroidism initially presented to [facility] for evaluation of shortness of breath.  Chest x-ray showed opacity in the left apex . patient was also found to be in atrial fibrillation underwent cardioversion in the ED and returned to sinus rhythm. Hospital course complicated by staph aureus bacteremia. ID consulted and recommendations given.  Cardiology consulted for TEE.  Assessment & Plan:   Principal Problem:   Acute respiratory failure with hypoxia (HCC) Active Problems:   Paroxysmal atrial fibrillation with RVR (HCC)   Lung cancer (HCC)   AKI (acute kidney injury) ([place])   Anemia associated with chemotherapy   Hypotension   Staphylococcus aureus bacteremia   Malignancy (HCC)   Bloodstream infection due to central venous catheter   Acute respiratory failure with hypoxia probably secondary to community-acquired pneumonia/chemotherapy pneumonitis. Patient initially required about 15 L of high flow nasal cannula oxygen currently weaned off and is patient is on room air at this time.  Complete the course of antibiotics. Currently on IV vancomycin, to complete the course on 01/06/21, then switch to cefazolin to complete the MSSA treatment.  Taper prednisone in the next 2 weeks per PCCM. Continue with bronchodilators as needed, incentive spirometry and nasal cannula oxygen.   Sepsis secondary to staph /epidermidis bacteremia Present on admission. Repeat blood cultures negative and pending so far Infectious disease on board recommending TEE Cardiology consulted for TEE, scheduled for tomorrow.  Low grade  temp earlier this am and worsening leukocytosis . Worsening leukocytosis possibly from steroids.  Possible source Port-A-Cath which was removed by general surgery.    Paroxysmal atrial fibrillation with RVR s/p cardioversion in ED currently in sinus rhythm On Eliquis for anticoagulation. Rate controlled by metoprolol 25 mg BID.    Hypotension probably secondary to sepsis which has been resolved.   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 Follows up with Dr. Delton Coombes oncologist. Patient currently on active chemotherapy.   Hypothyroidism Continue with Synthroid.   Anemia of chronic disease/chemo associated anemia Transfuse to keep hemoglobin greater than 7. Hemoglobin stable around 8.    Anxiety Continue with Xanax.      Mild AKI probably secondary to sepsis Resolved.   Mild hyponatremia:  Sodium is 132.    DVT prophylaxis: Eliquis.  Code Status: (Full code) Family Communication: family at bedside.  Disposition:   Status is: Inpatient  Remains inpatient appropriate because: tee is pending.        Consultants:  Surgery ID.  Cardiology for TEE.   Procedures: none.   Antimicrobials:  Antibiotics Given (last 72 hours)     Date/Time Action Medication Dose Rate   01/01/21 0632 New Bag/Given   vancomycin (VANCOREADY) IVPB 1250 mg/250 mL 1,250 mg 166.7 mL/hr   01/01/21 0948 Given   cefdinir (OMNICEF) capsule 300 mg 300 mg    01/01/21 0949 Given   azithromycin (ZITHROMAX) tablet 500 mg 500 mg    01/01/21 2144 Given   cefdinir (OMNICEF) capsule 300 mg 300 mg    01/02/21 0549 New Bag/Given   vancomycin (VANCOREADY) IVPB 1250 mg/250 mL 1,250 mg 166.7 mL/hr   01/02/21 0850 Given   azithromycin (  ZITHROMAX) tablet 500 mg 500 mg    01/02/21 0851 Given   cefdinir (OMNICEF) capsule 300 mg 300 mg    01/02/21 2108 Given   cefdinir (OMNICEF) capsule 300 mg 300 mg    01/03/21 0539 New  Bag/Given   vancomycin (VANCOREADY) IVPB 1250 mg/250 mL 1,250 mg 166.7 mL/hr         Subjective: No new complaints.   Objective: Vitals:   01/02/21 1525 01/02/21 2023 01/03/21 0500 01/03/21 0734  BP: 123/69 120/69  125/78  Pulse: 67 65  (!) 59  Resp: 20 18  16   Temp: 98.2 F (36.8 C) 98.8 F (37.1 C)  99.7 F (37.6 C)  TempSrc: Oral Oral  Oral  SpO2: 95% 96%  96%  Weight:   70.6 kg   Height:        Intake/Output Summary (Last 24 hours) at 01/03/2021 1512 Last data filed at 01/03/2021 0500 Gross per 24 hour  Intake 1011.88 ml  Output 700 ml  Net 311.88 ml    Filed Weights   01/01/21 0500 01/02/21 0500 01/03/21 0500  Weight: 69.3 kg 69.3 kg 70.6 kg    Examination:  General exam: Appears calm and comfortable  Respiratory system: Clear to auscultation. Respiratory effort normal. Cardiovascular system: S1 & S2 heard, RRR. No JVD,  No pedal edema. Gastrointestinal system: Abdomen is nondistended, soft and nontender.  Normal bowel sounds heard. Central nervous system: Alert and oriented. No focal neurological deficits. Extremities: Symmetric 5 x 5 power. Skin: No rashes, lesions or ulcers Psychiatry: Mood & affect appropriate.      Data Reviewed: I have personally reviewed following labs and imaging studies  CBC: Recent Labs  Lab 12/29/20 1011 12/30/20 0746 12/31/20 0530 01/01/21 0346 01/02/21 0218 01/03/21 0335  WBC 5.5 7.2 10.2 12.4* 16.0* 21.6*  NEUTROABS 3.8  --  8.2* 10.2* 12.4* 15.9*  HGB 9.0* 7.3* 7.2* 7.4* 8.0* 8.4*  HCT 27.4* 23.2* 22.7* 24.3* 25.6* 26.8*  MCV 96.5 95.9 97.0 98.0 97.7 97.5  PLT 230 198 192 184 177 190     Basic Metabolic Panel: Recent Labs  Lab 12/30/20 0746 12/31/20 0530 01/01/21 0346 01/02/21 0218 01/03/21 0335  NA 135 134* 134* 131* 132*  K 4.3 4.0 4.6 4.6 5.1  CL 106 105 105 106 104  CO2 20* 23 20* 20* 23  GLUCOSE 142* 131* 212* 207* 144*  BUN 27* 24* 22 20 24*  CREATININE 0.98 0.94 0.87 0.87 0.94  CALCIUM  8.1* 7.9* 8.2* 8.0* 8.2*     GFR: Estimated Creatinine Clearance: 62.7 mL/min (by C-G formula based on SCr of 0.94 mg/dL).  Liver Function Tests: Recent Labs  Lab 12/29/20 1011  AST 24  ALT 21  ALKPHOS 53  BILITOT 1.1  PROT 6.3*  ALBUMIN 2.6*     CBG: Recent Labs  Lab 01/02/21 1135 01/02/21 1648 01/02/21 2151 01/03/21 0814 01/03/21 1223  GLUCAP 124* 135* 161* 105* 147*      Recent Results (from the past 240 hour(s))  Resp Panel by RT-PCR (Flu A&B, Covid) Nasopharyngeal Swab     Status: None   Collection Time: 12/29/20 10:12 AM   Specimen: Nasopharyngeal Swab; Nasopharyngeal(NP) swabs in vial transport medium  Result Value Ref Range Status   SARS Coronavirus 2 by RT PCR NEGATIVE NEGATIVE Final    Comment: (NOTE) SARS-CoV-2 target nucleic acids are NOT DETECTED.  The SARS-CoV-2 RNA is generally detectable in upper respiratory specimens during the acute phase of infection. The lowest concentration of SARS-CoV-2  viral copies this assay can detect is 138 copies/mL. A negative result does not preclude SARS-Cov-2 infection and should not be used as the sole basis for treatment or other patient management decisions. A negative result may occur with  improper specimen collection/handling, submission of specimen other than nasopharyngeal swab, presence of viral mutation(s) within the areas targeted by this assay, and inadequate number of viral copies(<138 copies/mL). A negative result must be combined with clinical observations, patient history, and epidemiological information. The expected result is Negative.  Fact Sheet for Patients:  EntrepreneurPulse.com.au  Fact Sheet for Healthcare Providers:  IncredibleEmployment.be  This test is no t yet approved or cleared by the [place] FDA and  has been authorized for detection and/or diagnosis of SARS-CoV-2 by FDA under an Emergency Use Authorization (EUA). This EUA will remain   in effect (meaning this test can be used) for the duration of the COVID-19 declaration under Section 564(b)(1) of the Act, 21 U.S.C.section 360bbb-3(b)(1), unless the authorization is terminated  or revoked sooner.       Influenza A by PCR NEGATIVE NEGATIVE Final   Influenza B by PCR NEGATIVE NEGATIVE Final    Comment: (NOTE) The Xpert Xpress SARS-CoV-2/FLU/RSV plus assay is intended as an aid in the diagnosis of influenza from Nasopharyngeal swab specimens and should not be used as a sole basis for treatment. Nasal washings and aspirates are unacceptable for Xpert Xpress SARS-CoV-2/FLU/RSV testing.  Fact Sheet for Patients: EntrepreneurPulse.com.au  Fact Sheet for Healthcare Providers: IncredibleEmployment.be  This test is not yet approved or cleared by the [place] FDA and has been authorized for detection and/or diagnosis of SARS-CoV-2 by FDA under an Emergency Use Authorization (EUA). This EUA will remain in effect (meaning this test can be used) for the duration of the COVID-19 declaration under Section 564(b)(1) of the Act, 21 U.S.C. section 360bbb-3(b)(1), unless the authorization is terminated or revoked.  Performed at [facility], [address] [place], [place] [address]   Culture, blood (routine x 2)     Status: Abnormal   Collection Time: 12/29/20 10:38 AM   Specimen: BLOOD RIGHT HAND  Result Value Ref Range Status   Specimen Description   Final    BLOOD RIGHT HAND BOTTLES DRAWN AEROBIC AND ANAEROBIC Performed at [facility], [address] [place], [place] [address]    Special Requests   Final    Blood Culture adequate volume Performed at [place] [place], [place] [address]    Culture  Setup Time   Final    GRAM POSITIVE COCCI IN BOTH AEROBIC AND ANAEROBIC BOTTLES Gram Stain Report Called to,Read Back By and Verified With: SIMPSON K. AT [facility] AT 2245 ON [id number] BY THOMPSON S. CRITICAL RESULT CALLED  TO, READ BACK BY AND VERIFIED WITH: M LILLISTON,PHARMD@0450  12/30/20 [place] Performed at [facility], [place] [address], [place], [place] [address]    Culture (A)  Final    STAPHYLOCOCCUS AUREUS STAPHYLOCOCCUS EPIDERMIDIS    Report Status 01/02/2021 FINAL  Final   Organism ID, Bacteria STAPHYLOCOCCUS AUREUS  Final   Organism ID, Bacteria STAPHYLOCOCCUS EPIDERMIDIS  Final      Susceptibility   Staphylococcus aureus - MIC*    CIPROFLOXACIN <=0.5 SENSITIVE Sensitive     ERYTHROMYCIN <=0.25 SENSITIVE Sensitive     GENTAMICIN <=0.5 SENSITIVE Sensitive     OXACILLIN 0.5 SENSITIVE Sensitive     TETRACYCLINE <=1 SENSITIVE Sensitive     VANCOMYCIN <=0.5 SENSITIVE Sensitive     TRIMETH/SULFA <=  10 SENSITIVE Sensitive     CLINDAMYCIN <=0.25 SENSITIVE Sensitive     RIFAMPIN <=0.5 SENSITIVE Sensitive     Inducible Clindamycin NEGATIVE Sensitive     * STAPHYLOCOCCUS AUREUS   Staphylococcus epidermidis - MIC*    CIPROFLOXACIN <=0.5 SENSITIVE Sensitive     ERYTHROMYCIN >=8 RESISTANT Resistant     GENTAMICIN <=0.5 SENSITIVE Sensitive     OXACILLIN >=4 RESISTANT Resistant     TETRACYCLINE >=16 RESISTANT Resistant     VANCOMYCIN 1 SENSITIVE Sensitive     TRIMETH/SULFA <=10 SENSITIVE Sensitive     CLINDAMYCIN RESISTANT Resistant     RIFAMPIN <=0.5 SENSITIVE Sensitive     Inducible Clindamycin POSITIVE Resistant     * STAPHYLOCOCCUS EPIDERMIDIS  Culture, blood (routine x 2)     Status: Abnormal (Preliminary result)   Collection Time: 12/29/20 10:38 AM   Specimen: Left Antecubital; Blood  Result Value Ref Range Status   Specimen Description   Final    LEFT ANTECUBITAL BOTTLES DRAWN AEROBIC ONLY Performed at [facility], [address] [place], [place] [address]    Special Requests   Final    Blood Culture adequate volume Performed at [facility], [address], [place], [place] [address]    Culture  Setup Time   Final    GRAM POSITIVE COCCI AEROBIC BOTTLE ONLY CRITICAL VALUE NOTED.   VALUE IS CONSISTENT WITH PREVIOUSLY REPORTED AND CALLED VALUE.    Culture (A)  Final    STAPHYLOCOCCUS EPIDERMIDIS CULTURE REINCUBATED FOR BETTER GROWTH Performed at [facility], [place] [address] [place], [place] [address]    Report Status PENDING  Incomplete  Blood Culture ID Panel (Reflexed)     Status: Abnormal   Collection Time: 12/29/20 10:38 AM  Result Value Ref Range Status   Enterococcus faecalis NOT DETECTED NOT DETECTED Final   Enterococcus Faecium NOT DETECTED NOT DETECTED Final   Listeria monocytogenes NOT DETECTED NOT DETECTED Final   Staphylococcus species DETECTED (A) NOT DETECTED Final    Comment: CRITICAL RESULT CALLED TO, READ BACK BY AND VERIFIED WITH: M LILLISTON,PHARMD@0450  12/30/20 [place]    Staphylococcus aureus (BCID) DETECTED (A) NOT DETECTED Final    Comment: CRITICAL RESULT CALLED TO, READ BACK BY AND VERIFIED WITH: M LILLISTON,PHARMD@0450  12/30/20 [place]    Staphylococcus epidermidis DETECTED (A) NOT DETECTED Final    Comment: CRITICAL RESULT CALLED TO, READ BACK BY AND VERIFIED WITH: M LILLISTON,PHARMD@0453  12/30/20 [place]    Staphylococcus lugdunensis NOT DETECTED NOT DETECTED Final   Streptococcus species NOT DETECTED NOT DETECTED Final   Streptococcus agalactiae NOT DETECTED NOT DETECTED Final   Streptococcus pneumoniae NOT DETECTED NOT DETECTED Final   Streptococcus pyogenes NOT DETECTED NOT DETECTED Final   A.calcoaceticus-baumannii NOT DETECTED NOT DETECTED Final   Bacteroides fragilis NOT DETECTED NOT DETECTED Final   Enterobacterales NOT DETECTED NOT DETECTED Final   Enterobacter cloacae complex NOT DETECTED NOT DETECTED Final   Escherichia coli NOT DETECTED NOT DETECTED Final   Klebsiella aerogenes NOT DETECTED NOT DETECTED Final   Klebsiella oxytoca NOT DETECTED NOT DETECTED Final   Klebsiella pneumoniae NOT DETECTED NOT DETECTED Final   Proteus species NOT DETECTED NOT DETECTED Final   Salmonella species NOT DETECTED NOT DETECTED Final    Serratia marcescens NOT DETECTED NOT DETECTED Final   Haemophilus influenzae NOT DETECTED NOT DETECTED Final   Neisseria meningitidis NOT DETECTED NOT DETECTED Final   Pseudomonas aeruginosa NOT DETECTED NOT DETECTED Final   Stenotrophomonas maltophilia NOT DETECTED NOT DETECTED Final  Candida albicans NOT DETECTED NOT DETECTED Final   Candida auris NOT DETECTED NOT DETECTED Final   Candida glabrata NOT DETECTED NOT DETECTED Final   Candida krusei NOT DETECTED NOT DETECTED Final   Candida parapsilosis NOT DETECTED NOT DETECTED Final   Candida tropicalis NOT DETECTED NOT DETECTED Final   Cryptococcus neoformans/gattii NOT DETECTED NOT DETECTED Final   Methicillin resistance mecA/C DETECTED (A) NOT DETECTED Final    Comment: CRITICAL RESULT CALLED TO, READ BACK BY AND VERIFIED WITH: M LILLISTON,PHARMD@0453  12/30/20 [place]    Meth resistant mecA/C and MREJ NOT DETECTED NOT DETECTED Final    Comment: Performed at [facility], [address], [place], [place] [address]  MRSA Next Gen by PCR, Nasal     Status: None   Collection Time: 12/29/20 11:04 PM   Specimen: Nasal Mucosa; Nasal Swab  Result Value Ref Range Status   MRSA by PCR Next Gen NOT DETECTED NOT DETECTED Final    Comment: (NOTE) The GeneXpert MRSA Assay (FDA approved for NASAL specimens only), is one component of a comprehensive MRSA colonization surveillance program. It is not intended to diagnose MRSA infection nor to guide or monitor treatment for MRSA infections. Test performance is not FDA approved in patients less than 49 years old. Performed at [facility], [place] [address], [place], [place] [address]   Culture, blood (routine x 2)     Status: None (Preliminary result)   Collection Time: 12/30/20  3:34 PM   Specimen: BLOOD RIGHT HAND  Result Value Ref Range Status   Specimen Description   Final    BLOOD RIGHT HAND Performed at [place] [address], [place],  [place] [address]    Special Requests   Final    BOTTLES DRAWN AEROBIC ONLY Blood Culture results may not be optimal due to an inadequate volume of blood received in culture bottles Performed at [place] [address], [place], [place] [address]    Culture   Final    NO GROWTH 3 DAYS Performed at [facility], [place] [address], [place], [place] [address]    Report Status PENDING  Incomplete  Culture, blood (routine x 2)     Status: None (Preliminary result)   Collection Time: 12/30/20  3:34 PM   Specimen: BLOOD  Result Value Ref Range Status   Specimen Description   Final    BLOOD LEFT ANTECUBITAL Performed at [place] [address], [place], [place] [address]    Special Requests   Final    BOTTLES DRAWN AEROBIC ONLY Blood Culture results may not be optimal due to an inadequate volume of blood received in culture bottles Performed at [place] [address] [place], [place] [address]    Culture   Final    NO GROWTH 3 DAYS Performed at [facility], [place] [address], [place], [place] [address]    Report Status PENDING  Incomplete  Expectorated Sputum Assessment w Gram Stain, Rflx to Resp Cult     Status: None   Collection Time: 12/31/20  8:35 AM   Specimen: Expectorated Sputum  Result Value Ref Range Status   Specimen Description EXPECTORATED SPUTUM  Final   Special Requests NONE  Final   Sputum evaluation   Final    THIS SPECIMEN IS ACCEPTABLE FOR SPUTUM CULTURE Performed at [facility], [place] [address], [place],  [address]    Report Status  12/31/2020 FINAL  Final  Culture, Respiratory w Gram Stain     Status: None   Collection Time: 12/31/20  8:35 AM  Result Value Ref Range Status   Specimen Description   Final    EXPECTORATED SPUTUM Performed at [facility], [place] [address], [place], [place] [address]    Special Requests   Final    NONE Reflexed from  [id number] Performed at [facility], [place] [address] [place], [place] [address]    Gram Stain   Final    RARE SQUAMOUS EPITHELIAL CELLS PRESENT RARE WBC PRESENT,BOTH PMN AND MONONUCLEAR NO ORGANISMS SEEN Performed at [facility], [place] [address], [place], [place] [address]    Culture RARE STAPHYLOCOCCUS AUREUS  Final   Report Status 01/03/2021 FINAL  Final   Organism ID, Bacteria STAPHYLOCOCCUS AUREUS  Final      Susceptibility   Staphylococcus aureus - MIC*    CIPROFLOXACIN <=0.5 SENSITIVE Sensitive     ERYTHROMYCIN <=0.25 SENSITIVE Sensitive     GENTAMICIN <=0.5 SENSITIVE Sensitive     OXACILLIN 0.5 SENSITIVE Sensitive     TETRACYCLINE <=1 SENSITIVE Sensitive     VANCOMYCIN <=0.5 SENSITIVE Sensitive     TRIMETH/SULFA <=10 SENSITIVE Sensitive     CLINDAMYCIN <=0.25 SENSITIVE Sensitive     RIFAMPIN <=0.5 SENSITIVE Sensitive     Inducible Clindamycin NEGATIVE Sensitive     * RARE STAPHYLOCOCCUS AUREUS  Aerobic/Anaerobic Culture w Gram Stain (surgical/deep wound)     Status: None (Preliminary result)   Collection Time: 01/01/21  1:17 PM   Specimen: PATH Other; Tissue  Result Value Ref Range Status   Specimen Description   Final    WOUND INFECTED INFUSION PORT SITE Performed at [place] [address] [place], [place] [address]    Special Requests   Final    NONE Performed at [facility], [place] [address], [place], [place] [address]    Gram Stain   Final    FEW WBC PRESENT, PREDOMINANTLY MONONUCLEAR NO ORGANISMS SEEN    Culture   Final    NO GROWTH 2 DAYS NO ANAEROBES ISOLATED; CULTURE IN PROGRESS FOR 5 DAYS Performed at [place] [address] [place], [place] [address]    Report Status PENDING  Incomplete  Culture, blood (routine x 2)     Status: None (Preliminary result)   Collection Time: 01/02/21  2:18 AM   Specimen: BLOOD RIGHT HAND  Result Value Ref Range Status   Specimen Description    Final    BLOOD RIGHT HAND Performed at [place] [address], [place], [place] [address]    Special Requests   Final    BOTTLES DRAWN AEROBIC ONLY Blood Culture adequate volume Performed at [place] [address] [place], [place] [address]    Culture   Final    NO GROWTH < 12 HOURS Performed at [place] [address], [place], [place] [address]    Report Status PENDING  Incomplete  Culture, blood (routine x 2)     Status: None (Preliminary result)   Collection Time: 01/02/21  2:18 AM   Specimen: Left Antecubital; Blood  Result Value Ref Range Status   Specimen Description   Final    LEFT ANTECUBITAL BLOOD Performed at [facility], [place] [address] [place], [place] [address]    Special Requests   Final    BOTTLES  DRAWN AEROBIC ONLY Blood Culture results may not be optimal due to an inadequate volume of blood received in culture bottles Performed at [facility] [place] [address], [place], [place] [address]    Culture   Final    NO GROWTH < 12 HOURS Performed at [place] [address], [place], [place] [address]    Report Status PENDING  Incomplete          Radiology Studies: No results found.      Scheduled Meds:  apixaban  5 mg Oral BID   feeding supplement  237 mL Oral TID BM   insulin aspart  0-5 Units Subcutaneous QHS   insulin aspart  0-9 Units Subcutaneous TID WC   levothyroxine  100 mcg Oral QAC breakfast   metoprolol tartrate  25 mg Oral BID   miconazole nitrate   Topical TID   multivitamin with minerals  1 tablet Oral Daily   pantoprazole  40 mg Oral Daily   predniSONE  40 mg Oral Q breakfast   sodium chloride flush  3 mL Intravenous Q12H   Continuous Infusions:  sodium chloride Stopped (01/02/21 1232)   sodium chloride Stopped (12/31/20 1202)   vancomycin 1,250 mg (01/03/21 0539)     LOS: 5 days       Hosie Poisson, MD [facility]   To contact the attending provider between 7A-7P or the covering provider during after hours 7P-7A, please log into the web site [email or web address] and access using universal [place] password for that web site. If you do not have the password, please call the hospital operator.  01/03/2021, 3:12 PM

## 2021-01-03 NOTE — Progress Notes (Signed)
    [facility] has been requested to perform a transesophageal echocardiogram on 11/18 for bacteremia.  After careful review of history and examination, the risks and benefits of transesophageal echocardiogram have been explained including risks of esophageal damage, perforation (1:10,000 risk), bleeding, pharyngeal hematoma as well as other potential complications associated with conscious sedation including aspiration, arrhythmia, respiratory failure and death. Alternatives to treatment were discussed, questions were answered. Patient is willing to proceed.   Rosaria Ferries, PA-C 01/03/2021 12:32 PM

## 2021-01-04 ENCOUNTER — Inpatient Hospital Stay (HOSPITAL_COMMUNITY): Payer: Medicare Other

## 2021-01-04 ENCOUNTER — Encounter (HOSPITAL_COMMUNITY)
Admission: EM | Disposition: A | Discharge status: Home / Self Care | Payer: Self-pay | Source: Home / Self Care | Attending: Internal Medicine

## 2021-01-04 ENCOUNTER — Inpatient Hospital Stay (HOSPITAL_COMMUNITY): Payer: Medicare Other | Admitting: Anesthesiology

## 2021-01-04 ENCOUNTER — Inpatient Hospital Stay: Payer: Self-pay

## 2021-01-04 ENCOUNTER — Encounter (HOSPITAL_COMMUNITY): Payer: Self-pay | Admitting: Internal Medicine

## 2021-01-04 DIAGNOSIS — I340 Nonrheumatic mitral (valve) insufficiency: Secondary | ICD-10-CM | POA: Diagnosis not present

## 2021-01-04 DIAGNOSIS — B9561 Methicillin susceptible Staphylococcus aureus infection as the cause of diseases classified elsewhere: Secondary | ICD-10-CM | POA: Diagnosis not present

## 2021-01-04 DIAGNOSIS — J189 Pneumonia, unspecified organism: Secondary | ICD-10-CM | POA: Diagnosis not present

## 2021-01-04 DIAGNOSIS — I3139 Other pericardial effusion (noninflammatory): Secondary | ICD-10-CM | POA: Diagnosis not present

## 2021-01-04 DIAGNOSIS — T80211D Bloodstream infection due to central venous catheter, subsequent encounter: Secondary | ICD-10-CM | POA: Diagnosis not present

## 2021-01-04 DIAGNOSIS — R7881 Bacteremia: Secondary | ICD-10-CM | POA: Diagnosis not present

## 2021-01-04 DIAGNOSIS — A4101 Sepsis due to Methicillin susceptible Staphylococcus aureus: Secondary | ICD-10-CM | POA: Diagnosis not present

## 2021-01-04 DIAGNOSIS — Z20822 Contact with and (suspected) exposure to covid-19: Secondary | ICD-10-CM | POA: Diagnosis not present

## 2021-01-04 DIAGNOSIS — T80211A Bloodstream infection due to central venous catheter, initial encounter: Secondary | ICD-10-CM | POA: Diagnosis not present

## 2021-01-04 HISTORY — PX: TEE WITHOUT CARDIOVERSION: SHX5443

## 2021-01-04 LAB — CBC WITH DIFFERENTIAL/PLATELET
Abs Immature Granulocytes: 2.41 10*3/uL — ABNORMAL HIGH (ref 0.00–0.07)
Basophils Absolute: 0.1 10*3/uL (ref 0.0–0.1)
Basophils Relative: 1 %
Eosinophils Absolute: 0 10*3/uL (ref 0.0–0.5)
Eosinophils Relative: 0 %
HCT: 27.9 % — ABNORMAL LOW (ref 39.0–52.0)
Hemoglobin: 8.6 g/dL — ABNORMAL LOW (ref 13.0–17.0)
Immature Granulocytes: 13 %
Lymphocytes Relative: 4 %
Lymphs Abs: 0.8 10*3/uL (ref 0.7–4.0)
MCH: 30.5 pg (ref 26.0–34.0)
MCHC: 30.8 g/dL (ref 30.0–36.0)
MCV: 98.9 fL (ref 80.0–100.0)
Monocytes Absolute: 1.3 10*3/uL — ABNORMAL HIGH (ref 0.1–1.0)
Monocytes Relative: 7 %
Neutro Abs: 14.5 10*3/uL — ABNORMAL HIGH (ref 1.7–7.7)
Neutrophils Relative %: 75 %
Platelets: 201 10*3/uL (ref 150–400)
RBC: 2.82 MIL/uL — ABNORMAL LOW (ref 4.22–5.81)
RDW: 16.9 % — ABNORMAL HIGH (ref 11.5–15.5)
WBC: 19.1 10*3/uL — ABNORMAL HIGH (ref 4.0–10.5)
nRBC: 5.3 % — ABNORMAL HIGH (ref 0.0–0.2)

## 2021-01-04 LAB — BASIC METABOLIC PANEL
Anion gap: 9 (ref 5–15)
BUN: 26 mg/dL — ABNORMAL HIGH (ref 8–23)
CO2: 22 mmol/L (ref 22–32)
Calcium: 8.2 mg/dL — ABNORMAL LOW (ref 8.9–10.3)
Chloride: 102 mmol/L (ref 98–111)
Creatinine, Ser: 0.91 mg/dL (ref 0.61–1.24)
GFR, Estimated: >60 mL/min (ref >60)
Glucose, Bld: 156 mg/dL — ABNORMAL HIGH (ref 70–99)
Potassium: 4.4 mmol/L (ref 3.5–5.1)
Sodium: 133 mmol/L — ABNORMAL LOW (ref 135–145)

## 2021-01-04 LAB — CULTURE, BLOOD (ROUTINE X 2)
Culture: NO GROWTH
Culture: NO GROWTH

## 2021-01-04 LAB — GLUCOSE, CAPILLARY
Glucose-Capillary: 96 mg/dL (ref 70–99)
Glucose-Capillary: 77 mg/dL (ref 70–99)
Glucose-Capillary: 184 mg/dL — ABNORMAL HIGH (ref 70–99)
Glucose-Capillary: 201 mg/dL — ABNORMAL HIGH (ref 70–99)

## 2021-01-04 SURGERY — ECHOCARDIOGRAM, TRANSESOPHAGEAL
Anesthesia: Monitor Anesthesia Care

## 2021-01-04 MED ORDER — CEFAZOLIN SODIUM-DEXTROSE 2-4 GM/100ML-% IV SOLN
2.0000 g | Freq: Three times a day (TID) | INTRAVENOUS | Status: DC
  Administered 2021-01-05: 2 g via INTRAVENOUS
  Filled 2021-01-04 (×2): qty 100

## 2021-01-04 MED ORDER — PROPOFOL 500 MG/50ML IV EMUL
INTRAVENOUS | Status: DC | PRN
  Administered 2021-01-04: 100 ug/kg/min via INTRAVENOUS

## 2021-01-04 MED ORDER — SODIUM CHLORIDE 0.9% FLUSH
10.0000 mL | Freq: Two times a day (BID) | INTRAVENOUS | Status: DC
  Administered 2021-01-05: 10 mL

## 2021-01-04 MED ORDER — PREDNISONE 20 MG PO TABS
30.0000 mg | ORAL_TABLET | Freq: Every day | ORAL | Status: DC
  Administered 2021-01-04 – 2021-01-05 (×2): 30 mg via ORAL
  Filled 2021-01-04 (×2): qty 1

## 2021-01-04 MED ORDER — SODIUM CHLORIDE 0.9 % IV SOLN: INTRAVENOUS | Status: DC | PRN

## 2021-01-04 MED ORDER — LINEZOLID 600 MG PO TABS: 600.0000 mg | ORAL_TABLET | Freq: Two times a day (BID) | ORAL | 0 refills | Status: AC

## 2021-01-04 MED ORDER — CEFAZOLIN IV (FOR PTA / DISCHARGE USE ONLY): 2.0000 g | Freq: Three times a day (TID) | INTRAVENOUS | 0 refills | Status: AC

## 2021-01-04 MED ORDER — PROPOFOL 10 MG/ML IV BOLUS
INTRAVENOUS | Status: DC | PRN
  Administered 2021-01-04 (×3): 20 mg via INTRAVENOUS

## 2021-01-04 MED ORDER — BUTAMBEN-TETRACAINE-BENZOCAINE 2-2-14 % EX AERO
INHALATION_SPRAY | CUTANEOUS | Status: DC | PRN
  Administered 2021-01-04: 2 via TOPICAL

## 2021-01-04 MED ORDER — SODIUM CHLORIDE 0.9% FLUSH: 10.0000 mL | INTRAVENOUS | Status: DC | PRN

## 2021-01-04 NOTE — OR Nursing (Signed)
Called report to Tess @[facility] .

## 2021-01-04 NOTE — Progress Notes (Signed)
    Transesophageal Echocardiogram Note  Alan [name] [id number] 1950-08-01  Procedure: Transesophageal Echocardiogram Indications: Bacteremia  Procedure Details Consent: Obtained Time Out: Verified patient identification, verified procedure, site/side was marked, verified correct patient position, special equipment/implants available, Radiology Safety Procedures followed,  medications/allergies/relevent history reviewed, required imaging and test results available.  Performed  Medications:  Pt sedated by anesthesia with diprovan 110 mg IV.  Normal LV function; no vegetations.   Complications: No apparent complications Patient did tolerate procedure well.  Kirk Ruths, MD

## 2021-01-04 NOTE — Progress Notes (Signed)
PT Cancellation Note  Patient Details Name: Alan [name] MRN: [id number] DOB: [birth year]   Cancelled Treatment:    Reason Eval/Treat Not Completed: Patient at procedure or test/unavailable   Kati L Payson 01/04/2021, 9:07 AM Arlyce Dice, DPT Acute Rehabilitation Services Pager: [phone] Office: [phone]

## 2021-01-04 NOTE — Interval H&P Note (Signed)
History and Physical Interval Note:  01/04/2021 7:59 AM  Alan [name]  has presented today for surgery, with the diagnosis of BACTEREMIA.  The various methods of treatment have been discussed with the patient and family. After consideration of risks, benefits and other options for treatment, the patient has consented to  Procedure(s): TRANSESOPHAGEAL ECHOCARDIOGRAM (TEE) (N/A) as a surgical intervention.  The patient's history has been reviewed, patient examined, no change in status, stable for surgery.  I have reviewed the patient's chart and labs.  Questions were answered to the patient's satisfaction.     Kirk Ruths

## 2021-01-04 NOTE — Transfer of Care (Signed)
Immediate Anesthesia Transfer of Care Note  Patient: Alan [name]  Procedure(s) Performed: TRANSESOPHAGEAL ECHOCARDIOGRAM (TEE)  Patient Location: PACU  Anesthesia Type:MAC  Level of Consciousness: drowsy and patient cooperative  Airway & Oxygen Therapy: Patient Spontanous Breathing  Post-op Assessment: Report given to RN and Post -op Vital signs reviewed and stable  Post vital signs: Reviewed and stable  Last Vitals: see Endo PACU flowsheet Vitals Value Taken Time  BP    Temp    Pulse    Resp    SpO2      Last Pain:  Vitals:   01/04/21 0837  TempSrc: Temporal  PainSc: 0-No pain      Patients Stated Pain Goal: 0 (16/83/72 9021)  Complications: No notable events documented.

## 2021-01-04 NOTE — Progress Notes (Signed)
PROGRESS NOTE    Alan [name]  QJJ:[id number] DOB: 08-11-1950 DOA: 12/29/2020 PCP: [facility], [facility]    No chief complaint on file.   Brief Narrative:  70 year old gentleman with prior history of metastatic lung cancer on active chemotherapy s/p right frontal craniotomy for metastatic tumor resection, paroxysmal atrial fibrillation on Eliquis, hypertension, hypothyroidism initially presented to [facility] for evaluation of shortness of breath.  Chest x-ray showed opacity in the left apex . patient was also found to be in atrial fibrillation underwent cardioversion in the ED and returned to sinus rhythm. Hospital course complicated by staph aureus bacteremia. ID consulted and recommendations given.  Cardiology consulted for TEE.  Assessment & Plan:   Principal Problem:   Acute respiratory failure with hypoxia (HCC) Active Problems:   Paroxysmal atrial fibrillation with RVR (HCC)   Lung cancer (HCC)   AKI (acute kidney injury) ([place])   Anemia associated with chemotherapy   Hypotension   Staphylococcus aureus bacteremia   Malignancy (HCC)   Bloodstream infection due to central venous catheter   Acute respiratory failure with hypoxia probably secondary to community-acquired pneumonia/chemotherapy pneumonitis. Patient initially required about 15 L of high flow nasal cannula oxygen currently weaned off and is patient is on room air at this time.  Complete the course of antibiotics. Currently on IV vancomycin, to complete the course on 01/06/21, then switch to cefazolin to complete the MSSA treatment.  Taper prednisone in the next 2 weeks per PCCM. Continue with bronchodilators as needed, incentive spirometry and nasal cannula oxygen.   Sepsis secondary to staph /epidermidis bacteremia Present on admission. Repeat blood cultures negative and pending so far Infectious disease on board recommending TEE Cardiology consulted for TEE, scheduled for tomorrow.  Low grade  temp earlier this am and worsening leukocytosis . Worsening leukocytosis possibly from steroids.  Possible source Port-A-Cath which was removed by general surgery.    Paroxysmal atrial fibrillation with RVR s/p cardioversion in ED currently in sinus rhythm On Eliquis for anticoagulation. Rate controlled by metoprolol 25 mg BID.    Hypotension probably secondary to sepsis which has been resolved.   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 Follows up with Dr. Delton Coombes oncologist. Patient currently on active chemotherapy.   Hypothyroidism Continue with Synthroid.   Anemia of chronic disease/chemo associated anemia Transfuse to keep hemoglobin greater than 7. Hemoglobin stable around 8.    Anxiety Continue with Xanax.      Mild AKI probably secondary to sepsis Resolved.   Mild hyponatremia:  Sodium is 132.    DVT prophylaxis: Eliquis.  Code Status: (Full code) Family Communication: family at bedside.  Disposition:   Status is: Inpatient  Remains inpatient appropriate because: tee is pending.        Consultants:  Surgery ID.  Cardiology for TEE.   Procedures: none.   Antimicrobials:  Antibiotics Given (last 72 hours)     Date/Time Action Medication Dose Rate   01/01/21 2144 Given   cefdinir (OMNICEF) capsule 300 mg 300 mg    01/02/21 0549 New Bag/Given   [MAR Hold] vancomycin (VANCOREADY) IVPB 1250 mg/250 mL (MAR Hold since Fri 01/04/2021 at 0835.Hold Reason: Transfer to a Procedural area) 1,250 mg 166.7 mL/hr   01/02/21 0850 Given   azithromycin (ZITHROMAX) tablet 500 mg 500 mg    01/02/21 0851 Given   cefdinir (OMNICEF) capsule 300 mg 300 mg    01/02/21 2108 Given   cefdinir (OMNICEF) capsule 300  mg 300 mg    01/03/21 0539 New Bag/Given   [MAR Hold] vancomycin (VANCOREADY) IVPB 1250 mg/250 mL (MAR Hold since Fri 01/04/2021 at 0835.Hold Reason: Transfer to a Procedural  area) 1,250 mg 166.7 mL/hr   01/04/21 0605 New Bag/Given   [MAR Hold] vancomycin (VANCOREADY) IVPB 1250 mg/250 mL (MAR Hold since Fri 01/04/2021 at 0835.Hold Reason: Transfer to a Procedural area) 1,250 mg 166.7 mL/hr         Subjective: No new complaints.   Objective: Vitals:   01/04/21 0837 01/04/21 0922 01/04/21 0932 01/04/21 0946  BP: (!) 149/70 (!) 111/50 (!) 124/51 (!) 129/59  Pulse: (!) 54 (!) 55 (!) 55 (!) 56  Resp: (!) 22 16 17 20   Temp: (!) 97.5 F (36.4 C) 97.6 F (36.4 C)    TempSrc: Temporal Temporal    SpO2: 100% 98% 99% 100%  Weight: 70.6 kg     Height: 5\' 5"  (1.651 m)       Intake/Output Summary (Last 24 hours) at 01/04/2021 0956 Last data filed at 01/04/2021 0916 Gross per 24 hour  Intake 687 ml  Output 300 ml  Net 387 ml    Filed Weights   01/02/21 0500 01/03/21 0500 01/04/21 0837  Weight: 69.3 kg 70.6 kg 70.6 kg    Examination:  General exam: Appears calm and comfortable  Respiratory system: Clear to auscultation. Respiratory effort normal. Cardiovascular system: S1 & S2 heard, RRR. No JVD,  No pedal edema. Gastrointestinal system: Abdomen is nondistended, soft and nontender.  Normal bowel sounds heard. Central nervous system: Alert and oriented. No focal neurological deficits. Extremities: Symmetric 5 x 5 power. Skin: No rashes, lesions or ulcers Psychiatry: Mood & affect appropriate.      Data Reviewed: I have personally reviewed following labs and imaging studies  CBC: Recent Labs  Lab 12/31/20 0530 01/01/21 0346 01/02/21 0218 01/03/21 0335 01/04/21 0336  WBC 10.2 12.4* 16.0* 21.6* 19.1*  NEUTROABS 8.2* 10.2* 12.4* 15.9* 14.5*  HGB 7.2* 7.4* 8.0* 8.4* 8.6*  HCT 22.7* 24.3* 25.6* 26.8* 27.9*  MCV 97.0 98.0 97.7 97.5 98.9  PLT 192 184 177 190 201     Basic Metabolic Panel: Recent Labs  Lab 12/31/20 0530 01/01/21 0346 01/02/21 0218 01/03/21 0335 01/04/21 0336  NA 134* 134* 131* 132* 133*  K 4.0 4.6 4.6 5.1 4.4  CL  105 105 106 104 102  CO2 23 20* 20* 23 22  GLUCOSE 131* 212* 207* 144* 156*  BUN 24* 22 20 24* 26*  CREATININE 0.94 0.87 0.87 0.94 0.91  CALCIUM 7.9* 8.2* 8.0* 8.2* 8.2*     GFR: Estimated Creatinine Clearance: 64.8 mL/min (by C-G formula based on SCr of 0.91 mg/dL).  Liver Function Tests: Recent Labs  Lab 12/29/20 1011  AST 24  ALT 21  ALKPHOS 53  BILITOT 1.1  PROT 6.3*  ALBUMIN 2.6*     CBG: Recent Labs  Lab 01/03/21 0814 01/03/21 1223 01/03/21 1747 01/03/21 2053 01/04/21 0738  GLUCAP 105* 147* 166* 178* 96      Recent Results (from the past 240 hour(s))  Resp Panel by RT-PCR (Flu A&B, Covid) Nasopharyngeal Swab     Status: None   Collection Time: 12/29/20 10:12 AM   Specimen: Nasopharyngeal Swab; Nasopharyngeal(NP) swabs in vial transport medium  Result Value Ref Range Status   SARS Coronavirus 2 by RT PCR NEGATIVE NEGATIVE Final    Comment: (NOTE) SARS-CoV-2 target nucleic acids are NOT DETECTED.  The SARS-CoV-2 RNA is generally detectable in  upper respiratory specimens during the acute phase of infection. The lowest concentration of SARS-CoV-2 viral copies this assay can detect is 138 copies/mL. A negative result does not preclude SARS-Cov-2 infection and should not be used as the sole basis for treatment or other patient management decisions. A negative result may occur with  improper specimen collection/handling, submission of specimen other than nasopharyngeal swab, presence of viral mutation(s) within the areas targeted by this assay, and inadequate number of viral copies(<138 copies/mL). A negative result must be combined with clinical observations, patient history, and epidemiological information. The expected result is Negative.  Fact Sheet for Patients:  EntrepreneurPulse.com.au  Fact Sheet for Healthcare Providers:  IncredibleEmployment.be  This test is no t yet approved or cleared by the [place]  FDA and  has been authorized for detection and/or diagnosis of SARS-CoV-2 by FDA under an Emergency Use Authorization (EUA). This EUA will remain  in effect (meaning this test can be used) for the duration of the COVID-19 declaration under Section 564(b)(1) of the Act, 21 U.S.C.section 360bbb-3(b)(1), unless the authorization is terminated  or revoked sooner.       Influenza A by PCR NEGATIVE NEGATIVE Final   Influenza B by PCR NEGATIVE NEGATIVE Final    Comment: (NOTE) The Xpert Xpress SARS-CoV-2/FLU/RSV plus assay is intended as an aid in the diagnosis of influenza from Nasopharyngeal swab specimens and should not be used as a sole basis for treatment. Nasal washings and aspirates are unacceptable for Xpert Xpress SARS-CoV-2/FLU/RSV testing.  Fact Sheet for Patients: EntrepreneurPulse.com.au  Fact Sheet for Healthcare Providers: IncredibleEmployment.be  This test is not yet approved or cleared by the [place] FDA and has been authorized for detection and/or diagnosis of SARS-CoV-2 by FDA under an Emergency Use Authorization (EUA). This EUA will remain in effect (meaning this test can be used) for the duration of the COVID-19 declaration under Section 564(b)(1) of the Act, 21 U.S.C. section 360bbb-3(b)(1), unless the authorization is terminated or revoked.  Performed at [facility], [address], [place], [place] [address]   Culture, blood (routine x 2)     Status: Abnormal   Collection Time: 12/29/20 10:38 AM   Specimen: BLOOD RIGHT HAND  Result Value Ref Range Status   Specimen Description   Final    BLOOD RIGHT HAND BOTTLES DRAWN AEROBIC AND ANAEROBIC Performed at [facility], [address] [place], [place] [address]    Special Requests   Final    Blood Culture adequate volume Performed at [place] [place], [place] [address]    Culture  Setup Time   Final    GRAM POSITIVE COCCI IN BOTH AEROBIC AND  ANAEROBIC BOTTLES Gram Stain Report Called to,Read Back By and Verified With: SIMPSON K. AT [facility] AT 2245 ON [id number] BY THOMPSON S. CRITICAL RESULT CALLED TO, READ BACK BY AND VERIFIED WITH: M LILLISTON,PHARMD@0450  12/30/20 [place] Performed at [facility], [place] [address], [place], [place] [address]    Culture (A)  Final    STAPHYLOCOCCUS AUREUS STAPHYLOCOCCUS EPIDERMIDIS    Report Status 01/02/2021 FINAL  Final   Organism ID, Bacteria STAPHYLOCOCCUS AUREUS  Final   Organism ID, Bacteria STAPHYLOCOCCUS EPIDERMIDIS  Final      Susceptibility   Staphylococcus aureus - MIC*    CIPROFLOXACIN <=0.5 SENSITIVE Sensitive     ERYTHROMYCIN <=0.25 SENSITIVE Sensitive     GENTAMICIN <=0.5 SENSITIVE Sensitive     OXACILLIN 0.5 SENSITIVE Sensitive     TETRACYCLINE <=1 SENSITIVE  Sensitive     VANCOMYCIN <=0.5 SENSITIVE Sensitive     TRIMETH/SULFA <=10 SENSITIVE Sensitive     CLINDAMYCIN <=0.25 SENSITIVE Sensitive     RIFAMPIN <=0.5 SENSITIVE Sensitive     Inducible Clindamycin NEGATIVE Sensitive     * STAPHYLOCOCCUS AUREUS   Staphylococcus epidermidis - MIC*    CIPROFLOXACIN <=0.5 SENSITIVE Sensitive     ERYTHROMYCIN >=8 RESISTANT Resistant     GENTAMICIN <=0.5 SENSITIVE Sensitive     OXACILLIN >=4 RESISTANT Resistant     TETRACYCLINE >=16 RESISTANT Resistant     VANCOMYCIN 1 SENSITIVE Sensitive     TRIMETH/SULFA <=10 SENSITIVE Sensitive     CLINDAMYCIN RESISTANT Resistant     RIFAMPIN <=0.5 SENSITIVE Sensitive     Inducible Clindamycin POSITIVE Resistant     * STAPHYLOCOCCUS EPIDERMIDIS  Culture, blood (routine x 2)     Status: Abnormal (Preliminary result)   Collection Time: 12/29/20 10:38 AM   Specimen: Left Antecubital; Blood  Result Value Ref Range Status   Specimen Description   Final    LEFT ANTECUBITAL BOTTLES DRAWN AEROBIC ONLY Performed at [facility], [address] [place], [place] [address]    Special Requests   Final    Blood Culture adequate volume Performed at [facility], [address], [place], [place] [address]    Culture  Setup Time   Final    GRAM POSITIVE COCCI AEROBIC BOTTLE ONLY CRITICAL VALUE NOTED.  VALUE IS CONSISTENT WITH PREVIOUSLY REPORTED AND CALLED VALUE.    Culture (A)  Final    STAPHYLOCOCCUS EPIDERMIDIS CULTURE REINCUBATED FOR BETTER GROWTH Performed at [facility], [place] [address] [place], [place] [address]    Report Status PENDING  Incomplete  Blood Culture ID Panel (Reflexed)     Status: Abnormal   Collection Time: 12/29/20 10:38 AM  Result Value Ref Range Status   Enterococcus faecalis NOT DETECTED NOT DETECTED Final   Enterococcus Faecium NOT DETECTED NOT DETECTED Final   Listeria monocytogenes NOT DETECTED NOT DETECTED Final   Staphylococcus species DETECTED (A) NOT DETECTED Final    Comment: CRITICAL RESULT CALLED TO, READ BACK BY AND VERIFIED WITH: M LILLISTON,PHARMD@0450  12/30/20 [place]    Staphylococcus aureus (BCID) DETECTED (A) NOT DETECTED Final    Comment: CRITICAL RESULT CALLED TO, READ BACK BY AND VERIFIED WITH: M LILLISTON,PHARMD@0450  12/30/20 [place]    Staphylococcus epidermidis DETECTED (A) NOT DETECTED Final    Comment: CRITICAL RESULT CALLED TO, READ BACK BY AND VERIFIED WITH: M LILLISTON,PHARMD@0453  12/30/20 [place]    Staphylococcus lugdunensis NOT DETECTED NOT DETECTED Final   Streptococcus species NOT DETECTED NOT DETECTED Final   Streptococcus agalactiae NOT DETECTED NOT DETECTED Final   Streptococcus pneumoniae NOT DETECTED NOT DETECTED Final   Streptococcus pyogenes NOT DETECTED NOT DETECTED Final   A.calcoaceticus-baumannii NOT DETECTED NOT DETECTED Final   Bacteroides fragilis NOT DETECTED NOT DETECTED Final   Enterobacterales NOT DETECTED NOT DETECTED Final   Enterobacter cloacae complex NOT DETECTED NOT DETECTED Final   Escherichia coli NOT DETECTED NOT DETECTED Final   Klebsiella aerogenes NOT DETECTED NOT DETECTED Final   Klebsiella oxytoca NOT DETECTED NOT DETECTED Final   Klebsiella  pneumoniae NOT DETECTED NOT DETECTED Final   Proteus species NOT DETECTED NOT DETECTED Final   Salmonella species NOT DETECTED NOT DETECTED Final   Serratia marcescens NOT DETECTED NOT DETECTED Final   Haemophilus influenzae NOT DETECTED NOT DETECTED Final   Neisseria meningitidis NOT DETECTED NOT DETECTED Final   Pseudomonas aeruginosa NOT  DETECTED NOT DETECTED Final   Stenotrophomonas maltophilia NOT DETECTED NOT DETECTED Final   Candida albicans NOT DETECTED NOT DETECTED Final   Candida auris NOT DETECTED NOT DETECTED Final   Candida glabrata NOT DETECTED NOT DETECTED Final   Candida krusei NOT DETECTED NOT DETECTED Final   Candida parapsilosis NOT DETECTED NOT DETECTED Final   Candida tropicalis NOT DETECTED NOT DETECTED Final   Cryptococcus neoformans/gattii NOT DETECTED NOT DETECTED Final   Methicillin resistance mecA/C DETECTED (A) NOT DETECTED Final    Comment: CRITICAL RESULT CALLED TO, READ BACK BY AND VERIFIED WITH: M LILLISTON,PHARMD@0453  12/30/20 [place]    Meth resistant mecA/C and MREJ NOT DETECTED NOT DETECTED Final    Comment: Performed at [facility] [address], [place], [place] [address]  MRSA Next Gen by PCR, Nasal     Status: None   Collection Time: 12/29/20 11:04 PM   Specimen: Nasal Mucosa; Nasal Swab  Result Value Ref Range Status   MRSA by PCR Next Gen NOT DETECTED NOT DETECTED Final    Comment: (NOTE) The GeneXpert MRSA Assay (FDA approved for NASAL specimens only), is one component of a comprehensive MRSA colonization surveillance program. It is not intended to diagnose MRSA infection nor to guide or monitor treatment for MRSA infections. Test performance is not FDA approved in patients less than 61 years old. Performed at [facility], [place] [address] [place], [place] [address]   Culture, blood (routine x 2)     Status: None (Preliminary result)   Collection Time: 12/30/20  3:34 PM   Specimen: BLOOD RIGHT HAND  Result Value  Ref Range Status   Specimen Description   Final    BLOOD RIGHT HAND Performed at [facility] [address] [place], [place] [address]    Special Requests   Final    BOTTLES DRAWN AEROBIC ONLY Blood Culture results may not be optimal due to an inadequate volume of blood received in culture bottles Performed at [facility] [address] [place], [place] [address]    Culture   Final    NO GROWTH 4 DAYS Performed at [facility], [place] [address], [place], [place] [address]    Report Status PENDING  Incomplete  Culture, blood (routine x 2)     Status: None (Preliminary result)   Collection Time: 12/30/20  3:34 PM   Specimen: BLOOD  Result Value Ref Range Status   Specimen Description   Final    BLOOD LEFT ANTECUBITAL Performed at [facility] [address] [place], [place] [address]    Special Requests   Final    BOTTLES DRAWN AEROBIC ONLY Blood Culture results may not be optimal due to an inadequate volume of blood received in culture bottles Performed at [facility] [address], [place], [place] [address]    Culture   Final    NO GROWTH 4 DAYS Performed at [facility], [place] [address] [place], [place] [address]    Report Status PENDING  Incomplete  Expectorated Sputum Assessment w Gram Stain, Rflx to Resp Cult     Status: None   Collection Time: 12/31/20  8:35 AM   Specimen: Expectorated Sputum  Result Value Ref Range Status   Specimen Description EXPECTORATED SPUTUM  Final   Special Requests NONE  Final   Sputum evaluation   Final    THIS SPECIMEN IS ACCEPTABLE FOR SPUTUM CULTURE Performed at [facility]  [place] [address] [place], [place] [address]    Report Status 12/31/2020 FINAL  Final  Culture, Respiratory w Gram Stain     Status: None   Collection Time: 12/31/20  8:35 AM  Result Value Ref Range Status   Specimen Description   Final    EXPECTORATED  SPUTUM Performed at [facility], [place] [address], [place], [place] [address]    Special Requests   Final    NONE Reflexed from [id number] Performed at [facility], [place] [address] [place], [place] [address]    Gram Stain   Final    RARE SQUAMOUS EPITHELIAL CELLS PRESENT RARE WBC PRESENT,BOTH PMN AND MONONUCLEAR NO ORGANISMS SEEN Performed at [facility], [place] [address], [place], [place] [address]    Culture RARE STAPHYLOCOCCUS AUREUS  Final   Report Status 01/03/2021 FINAL  Final   Organism ID, Bacteria STAPHYLOCOCCUS AUREUS  Final      Susceptibility   Staphylococcus aureus - MIC*    CIPROFLOXACIN <=0.5 SENSITIVE Sensitive     ERYTHROMYCIN <=0.25 SENSITIVE Sensitive     GENTAMICIN <=0.5 SENSITIVE Sensitive     OXACILLIN 0.5 SENSITIVE Sensitive     TETRACYCLINE <=1 SENSITIVE Sensitive     VANCOMYCIN <=0.5 SENSITIVE Sensitive     TRIMETH/SULFA <=10 SENSITIVE Sensitive     CLINDAMYCIN <=0.25 SENSITIVE Sensitive     RIFAMPIN <=0.5 SENSITIVE Sensitive     Inducible Clindamycin NEGATIVE Sensitive     * RARE STAPHYLOCOCCUS AUREUS  Aerobic/Anaerobic Culture w Gram Stain (surgical/deep wound)     Status: None (Preliminary result)   Collection Time: 01/01/21  1:17 PM   Specimen: PATH Other; Tissue  Result Value Ref Range Status   Specimen Description   Final    WOUND INFECTED INFUSION PORT SITE Performed at [facility] [address] [place], [place] [address]    Special Requests   Final    NONE Performed at [facility], [place] [address], [place], [place] [address]    Gram Stain   Final    FEW WBC PRESENT, PREDOMINANTLY MONONUCLEAR NO ORGANISMS SEEN    Culture   Final    NO GROWTH 2 DAYS NO ANAEROBES ISOLATED; CULTURE IN PROGRESS FOR 5 DAYS Performed at [place] [address], [place], [place] [address]    Report Status PENDING  Incomplete  Culture, blood (routine x 2)     Status:  None (Preliminary result)   Collection Time: 01/02/21  2:18 AM   Specimen: BLOOD RIGHT HAND  Result Value Ref Range Status   Specimen Description   Final    BLOOD RIGHT HAND Performed at [facility] [address], [place], [place] [address]    Special Requests   Final    BOTTLES DRAWN AEROBIC ONLY Blood Culture adequate volume Performed at [facility] [address], [place], [place] [address]    Culture   Final    NO GROWTH 1 DAY Performed at [facility], [place] [address] [place], [place] [address]    Report Status PENDING  Incomplete  Culture, blood (routine x 2)     Status: None (Preliminary result)   Collection Time: 01/02/21  2:18 AM   Specimen: Left Antecubital; Blood  Result Value Ref Range Status   Specimen Description   Final    LEFT ANTECUBITAL BLOOD Performed at [facility], [place],  [place] [address]    Special Requests   Final    BOTTLES DRAWN AEROBIC ONLY Blood Culture results may not be optimal due to an inadequate volume of blood received in culture bottles Performed at [facility] [address] [place], [place] [address]    Culture   Final    NO GROWTH 1 DAY Performed at [facility], [place] [address], [place],  [address]    Report Status PENDING  Incomplete          Radiology Studies: No results found.      Scheduled Meds:  [MAR Hold] apixaban  5 mg Oral BID   [MAR Hold] feeding supplement  237 mL Oral TID BM   [MAR Hold] insulin aspart  0-5 Units Subcutaneous QHS   [MAR Hold] insulin aspart  0-9 Units Subcutaneous TID WC   [MAR Hold] levothyroxine  100 mcg Oral QAC breakfast   [MAR Hold] metoprolol tartrate  25 mg Oral BID   [MAR Hold] miconazole nitrate   Topical TID   [MAR Hold] multivitamin with minerals  1 tablet Oral Daily   [MAR Hold] pantoprazole  40 mg Oral Daily   [MAR Hold] predniSONE  40 mg Oral Q breakfast   [MAR Hold]  sodium chloride flush  3 mL Intravenous Q12H   Continuous Infusions:  [MAR Hold] sodium chloride Stopped (12/31/20 1202)   [MAR Hold] vancomycin 1,250 mg (01/04/21 0605)     LOS: 6 days       Hosie Poisson, MD [facility]   To contact the attending provider between 7A-7P or the covering provider during after hours 7P-7A, please log into the web site [email or web address] and access using universal [place] password for that web site. If you do not have the password, please call the hospital operator.  01/04/2021, 9:56 AM

## 2021-01-04 NOTE — Anesthesia Postprocedure Evaluation (Signed)
Anesthesia Post Note  Patient: Alan [name]  Procedure(s) Performed: TRANSESOPHAGEAL ECHOCARDIOGRAM (TEE)     Patient location during evaluation: PACU Anesthesia Type: MAC Level of consciousness: awake and alert Pain management: pain level controlled Vital Signs Assessment: post-procedure vital signs reviewed and stable Respiratory status: spontaneous breathing, nonlabored ventilation, respiratory function stable and patient connected to nasal cannula oxygen Cardiovascular status: stable and blood pressure returned to baseline Postop Assessment: no apparent nausea or vomiting Anesthetic complications: no   No notable events documented.  Last Vitals:  Vitals:   01/04/21 0946 01/04/21 1257  BP: (!) 129/59 (!) 123/53  Pulse: (!) 56 62  Resp: 20 18  Temp:  36.9 C  SpO2: 100% 97%    Last Pain:  Vitals:   01/04/21 1257  TempSrc: Oral  PainSc:                  Fabian Coca

## 2021-01-04 NOTE — Progress Notes (Signed)
Physical Therapy Treatment Patient Details Name: Alan [name] MRN: [id number] DOB: [birth year] Today's Date: 01/04/2021   History of Present Illness Patient is a 70 year old male who presented to the hosptial on 12/29/20 with shortness of breath and chronic cough. Pt was found to have acute respiratory failure with hypoxia, sepsis, a fib with RVR, AKI and bacteremia. PMH: metastatic lung cancer on chemo, HTN, A fib, and anxiety.    PT Comments    Pt requiring some encouragement to participate however eventually agreeable.  Pt ambulated around unit with RW and SPO2 99-100% on room air upon returning to room.    Recommendations for follow up therapy are one component of a multi-disciplinary discharge planning process, led by the attending physician.  Recommendations may be updated based on patient status, additional functional criteria and insurance authorization.  Follow Up Recommendations  No PT follow up     Assistance Recommended at Discharge None  Equipment Recommendations  None recommended by PT    Recommendations for Other Services       Precautions / Restrictions Precautions Precautions: Fall     Mobility  Bed Mobility Overal bed mobility: Needs Assistance Bed Mobility: Supine to Sit;Sit to Supine     Supine to sit: Supervision Sit to supine: Supervision        Transfers Overall transfer level: Needs assistance Equipment used: Rolling walker (2 wheels) Transfers: Sit to/from Stand Sit to Stand: Min guard           General transfer comment: verbal cues for hand placement    Ambulation/Gait Ambulation/Gait assistance: Min guard Gait Distance (Feet): 400 Feet Assistive device: Rolling walker (2 wheels) Gait Pattern/deviations: Step-through pattern;Decreased stride length       General Gait Details: pt steady with RW, denies SOB, SPO2 99-100% on room air upon returning to room, no rest breaks required   Stairs             Wheelchair  Mobility    Modified Rankin (Stroke Patients Only)       Balance                                            Cognition Arousal/Alertness: Awake/alert Behavior During Therapy: WFL for tasks assessed/performed;Flat affect Overall Cognitive Status: Within Functional Limits for tasks assessed                                          Exercises      General Comments        Pertinent Vitals/Pain Pain Assessment: No/denies pain    Home Living                          Prior Function            PT Goals (current goals can now be found in the care plan section) Progress towards PT goals: Progressing toward goals    Frequency    Min 3X/week      PT Plan Current plan remains appropriate    Co-evaluation              AM-PAC PT "6 Clicks" Mobility   Outcome Measure  Help needed turning from your back to your side while in a flat  bed without using bedrails?: None Help needed moving from lying on your back to sitting on the side of a flat bed without using bedrails?: A Little Help needed moving to and from a bed to a chair (including a wheelchair)?: A Little Help needed standing up from a chair using your arms (e.g., wheelchair or bedside chair)?: A Little Help needed to walk in hospital room?: A Little Help needed climbing 3-5 steps with a railing? : A Little 6 Click Score: 19    End of Session Equipment Utilized During Treatment: Gait belt Activity Tolerance: Patient tolerated treatment well Patient left: with call bell/phone within reach;with family/visitor present;in bed   PT Visit Diagnosis: Difficulty in walking, not elsewhere classified (R26.2)     Time: 1342-1400 PT Time Calculation (min) (ACUTE ONLY): 18 min  Charges:  $Gait Training: 8-22 mins                    Jannette Spanner PT, DPT Acute Rehabilitation Services Pager: [phone] Office: [place] 01/04/2021, 3:32 PM

## 2021-01-04 NOTE — Progress Notes (Signed)
[facility] for Infectious Disease  Date of Admission:  12/29/2020     Lines:  10/2019 right chest port   Abx: 11/13-c vanc  11/12-16 ceftriaxone --> cefdinir 11/12-16 azith oral on 11/14                                                        Assessment: 69 y.o. male former smoker, left lung NSC cancer s/p chemoradiation transitioned to second line chemotherapy in setting of disease progression (last cycle docetaxel and ramucirumab 12/19/2020), admitted in transfer from [place] on 11/12 for acute hypoxic respiratory failure with afib/rvr and septic shock found to have staph aureus bacteremia     #staph aureus bacteremia #staph epi bacteremia Sx of at least a week; nonspecific but in a chemotherapy patient 11/12 bcx positive for both. With the port, suspect both true infectious agent 11/13 bcx repeat ngtd 11/15 had port removed Cxr suggestive new left lung opacity ?cancer vs pna/pneumonitis vs septic emboli.  11/16 repeat bcx negative 11/13 tte negative 11/18 tee negative for vegetation  -finish vanc on 11/19 then transition to cefazolin; would do vanc/cefazolin 2 weeks from 11/15 until 11/29, then transition to another 2 weeks linezolid -ok to discharge after opat setup/finished vanc here -primary team to place midline today   OPAT Order Details             .Outpatient Parenteral Antibiotic Therapy Consult  Until discontinued       Provider:  (Not yet assigned)  Question Answer Comment  Antibiotic Cefazolin (Ancef) IVPB   Indications for use MSSA bacteremia   End Date 01/15/2021                  OPAT Orders Discharge antibiotics to be given via PICC line Discharge antibiotics: cefazolin iv until 11/29, then linezolid 600 mg po bid until 12/13  [place] Per Protocol:  Home health RN for IV administration and teaching; PICC line care and labs.    Labs weekly while on IV antibiotics: _x_ CBC with differential __ BMP _x_ CMP _x_ CRP __  ESR __ Vancomycin trough __ CK  _x_ Please pull PIC at completion of IV antibiotics __ Please leave PIC in place until doctor has seen patient or been notified  Fax weekly labs to [phone]  Clinic Follow Up Appt: 12/06 @ 245 with dr Gale Journey  @  [facility] [address], [place], [place] [address] Phone: [phone]    #hypoxic respiratory failure sepsis vs staph aureus involvement vs diastolic dysfunction vs concurrent bacterial pna vs metastatic lung disease  multifactorial a consideration sptum cx staph aureus s/p 5 day course CAP tx Doing well    #metastatic lung cancer squamous cell -last chemo cycle 11/02 -repeat brain mri 10/2020 no new lesion; improved edematous change -last chest ct 11/2020 showed increased size left lung cancerous lesion      Principal Problem:   Acute respiratory failure with hypoxia ([place]) Active Problems:   Paroxysmal atrial fibrillation with RVR ([place])   Lung cancer ([place])   AKI (acute kidney injury) ([place])   Anemia associated with chemotherapy   Hypotension   Staphylococcus aureus bacteremia   Malignancy ([place])   Bloodstream infection due to central venous catheter   No Known Allergies  Scheduled Meds:  apixaban  5 mg Oral BID   feeding supplement  237 mL Oral TID BM   insulin aspart  0-5 Units Subcutaneous QHS   insulin aspart  0-9 Units Subcutaneous TID WC   levothyroxine  100 mcg Oral QAC breakfast   metoprolol tartrate  25 mg Oral BID   miconazole nitrate   Topical TID   multivitamin with minerals  1 tablet Oral Daily   pantoprazole  40 mg Oral Daily   predniSONE  30 mg Oral Q breakfast   sodium chloride flush  10-40 mL Intracatheter Q12H   sodium chloride flush  3 mL Intravenous Q12H   Continuous Infusions:  sodium chloride Stopped (12/31/20 1202)   [START ON 01/05/2021]  ceFAZolin (ANCEF) IV     vancomycin 1,250 mg (01/04/21 0605)   PRN Meds:.sodium chloride, acetaminophen **OR** acetaminophen, albuterol,  ALPRAZolam, alum & mag hydroxide-simeth, dextromethorphan, ondansetron **OR** ondansetron (ZOFRAN) IV, ondansetron **OR** ondansetron (ZOFRAN) IV, oxyCODONE, sodium chloride flush, traMADol   SUBJECTIVE: Doing well Tee negative for endocarditis No n/v/diarrhea/rash No focal pain  Review of Systems: ROS All other ROS was negative, except mentioned above     OBJECTIVE: Vitals:   01/04/21 0922 01/04/21 0932 01/04/21 0946 01/04/21 1257  BP: (!) 111/50 (!) 124/51 (!) 129/59 (!) 123/53  Pulse: (!) 55 (!) 55 (!) 56 62  Resp: 16 17 20 18   Temp: 97.6 F (36.4 C)   98.4 F (36.9 C)  TempSrc: Temporal   Oral  SpO2: 98% 99% 100% 97%  Weight:      Height:       Body mass index is 25.9 kg/m.  Physical Exam  General/constitutional: no distress, pleasant HEENT: Normocephalic, PER, Conj Clear, EOMI, Oropharynx clear Neck supple CV: rrr no mrg Lungs: clear to auscultation, normal respiratory effort Abd: Soft, Nontender Ext: no edema Skin: No Rash Neuro: nonfocal MSK: no peripheral joint swelling/tenderness/warmth; back spines nontender  Lab Results Lab Results  Component Value Date   WBC 19.1 (H) 01/04/2021   HGB 8.6 (L) 01/04/2021   HCT 27.9 (L) 01/04/2021   MCV 98.9 01/04/2021   PLT 201 01/04/2021    Lab Results  Component Value Date   CREATININE 0.91 01/04/2021   BUN 26 (H) 01/04/2021   NA 133 (L) 01/04/2021   K 4.4 01/04/2021   CL 102 01/04/2021   CO2 22 01/04/2021    Lab Results  Component Value Date   ALT 21 12/29/2020   AST 24 12/29/2020   ALKPHOS 53 12/29/2020   BILITOT 1.1 12/29/2020      Microbiology: Recent Results (from the past 240 hour(s))  Resp Panel by RT-PCR (Flu A&B, Covid) Nasopharyngeal Swab     Status: None   Collection Time: 12/29/20 10:12 AM   Specimen: Nasopharyngeal Swab; Nasopharyngeal(NP) swabs in vial transport medium  Result Value Ref Range Status   SARS Coronavirus 2 by RT PCR NEGATIVE NEGATIVE Final    Comment:  (NOTE) SARS-CoV-2 target nucleic acids are NOT DETECTED.  The SARS-CoV-2 RNA is generally detectable in upper respiratory specimens during the acute phase of infection. The lowest concentration of SARS-CoV-2 viral copies this assay can detect is 138 copies/mL. A negative result does not preclude SARS-Cov-2 infection and should not be used as the sole basis for treatment or other patient management decisions. A negative result may occur with  improper specimen collection/handling, submission of specimen other than nasopharyngeal swab, presence of viral mutation(s) within the areas targeted by this assay, and inadequate number  of viral copies(<138 copies/mL). A negative result must be combined with clinical observations, patient history, and epidemiological information. The expected result is Negative.  Fact Sheet for Patients:  EntrepreneurPulse.com.au  Fact Sheet for Healthcare Providers:  IncredibleEmployment.be  This test is no t yet approved or cleared by the [place] FDA and  has been authorized for detection and/or diagnosis of SARS-CoV-2 by FDA under an Emergency Use Authorization (EUA). This EUA will remain  in effect (meaning this test can be used) for the duration of the COVID-19 declaration under Section 564(b)(1) of the Act, 21 U.S.C.section 360bbb-3(b)(1), unless the authorization is terminated  or revoked sooner.       Influenza A by PCR NEGATIVE NEGATIVE Final   Influenza B by PCR NEGATIVE NEGATIVE Final    Comment: (NOTE) The Xpert Xpress SARS-CoV-2/FLU/RSV plus assay is intended as an aid in the diagnosis of influenza from Nasopharyngeal swab specimens and should not be used as a sole basis for treatment. Nasal washings and aspirates are unacceptable for Xpert Xpress SARS-CoV-2/FLU/RSV testing.  Fact Sheet for Patients: EntrepreneurPulse.com.au  Fact Sheet for Healthcare  Providers: IncredibleEmployment.be  This test is not yet approved or cleared by the [place] FDA and has been authorized for detection and/or diagnosis of SARS-CoV-2 by FDA under an Emergency Use Authorization (EUA). This EUA will remain in effect (meaning this test can be used) for the duration of the COVID-19 declaration under Section 564(b)(1) of the Act, 21 U.S.C. section 360bbb-3(b)(1), unless the authorization is terminated or revoked.  Performed at [facility], [address] [place], [place] [address]   Culture, blood (routine x 2)     Status: Abnormal   Collection Time: 12/29/20 10:38 AM   Specimen: BLOOD RIGHT HAND  Result Value Ref Range Status   Specimen Description   Final    BLOOD RIGHT HAND BOTTLES DRAWN AEROBIC AND ANAEROBIC Performed at [facility], [address], [place], [place] [address]    Special Requests   Final    Blood Culture adequate volume Performed at [place] [place], [place] [address]    Culture  Setup Time   Final    GRAM POSITIVE COCCI IN BOTH AEROBIC AND ANAEROBIC BOTTLES Gram Stain Report Called to,Read Back By and Verified With: SIMPSON K. AT [facility] AT 2245 ON [id number] BY THOMPSON S. CRITICAL RESULT CALLED TO, READ BACK BY AND VERIFIED WITH: M LILLISTON,PHARMD@0450  12/30/20 [place] Performed at [facility], [place] [address] [place], [place] [address]    Culture (A)  Final    STAPHYLOCOCCUS AUREUS STAPHYLOCOCCUS EPIDERMIDIS    Report Status 01/02/2021 FINAL  Final   Organism ID, Bacteria STAPHYLOCOCCUS AUREUS  Final   Organism ID, Bacteria STAPHYLOCOCCUS EPIDERMIDIS  Final      Susceptibility   Staphylococcus aureus - MIC*    CIPROFLOXACIN <=0.5 SENSITIVE Sensitive     ERYTHROMYCIN <=0.25 SENSITIVE Sensitive     GENTAMICIN <=0.5 SENSITIVE Sensitive     OXACILLIN 0.5 SENSITIVE Sensitive     TETRACYCLINE <=1 SENSITIVE Sensitive     VANCOMYCIN <=0.5 SENSITIVE Sensitive     TRIMETH/SULFA <=10  SENSITIVE Sensitive     CLINDAMYCIN <=0.25 SENSITIVE Sensitive     RIFAMPIN <=0.5 SENSITIVE Sensitive     Inducible Clindamycin NEGATIVE Sensitive     * STAPHYLOCOCCUS AUREUS   Staphylococcus epidermidis - MIC*    CIPROFLOXACIN <=0.5 SENSITIVE Sensitive     ERYTHROMYCIN >=8 RESISTANT Resistant     GENTAMICIN <=0.5 SENSITIVE Sensitive  OXACILLIN >=4 RESISTANT Resistant     TETRACYCLINE >=16 RESISTANT Resistant     VANCOMYCIN 1 SENSITIVE Sensitive     TRIMETH/SULFA <=10 SENSITIVE Sensitive     CLINDAMYCIN RESISTANT Resistant     RIFAMPIN <=0.5 SENSITIVE Sensitive     Inducible Clindamycin POSITIVE Resistant     * STAPHYLOCOCCUS EPIDERMIDIS  Culture, blood (routine x 2)     Status: Abnormal (Preliminary result)   Collection Time: 12/29/20 10:38 AM   Specimen: Left Antecubital; Blood  Result Value Ref Range Status   Specimen Description   Final    LEFT ANTECUBITAL BOTTLES DRAWN AEROBIC ONLY Performed at [facility], [address], [place], [place] [address]    Special Requests   Final    Blood Culture adequate volume Performed at [facility], [address] [place], [place] [address]    Culture  Setup Time   Final    GRAM POSITIVE COCCI AEROBIC BOTTLE ONLY CRITICAL VALUE NOTED.  VALUE IS CONSISTENT WITH PREVIOUSLY REPORTED AND CALLED VALUE.    Culture (A)  Final    STAPHYLOCOCCUS EPIDERMIDIS SUSCEPTIBILITIES TO FOLLOW Performed at [facility], [place] [address], [place], [place] [address]    Report Status PENDING  Incomplete  Blood Culture ID Panel (Reflexed)     Status: Abnormal   Collection Time: 12/29/20 10:38 AM  Result Value Ref Range Status   Enterococcus faecalis NOT DETECTED NOT DETECTED Final   Enterococcus Faecium NOT DETECTED NOT DETECTED Final   Listeria monocytogenes NOT DETECTED NOT DETECTED Final   Staphylococcus species DETECTED (A) NOT DETECTED Final    Comment: CRITICAL RESULT CALLED TO, READ BACK BY AND VERIFIED WITH: M LILLISTON,PHARMD@0450   12/30/20 [place]    Staphylococcus aureus (BCID) DETECTED (A) NOT DETECTED Final    Comment: CRITICAL RESULT CALLED TO, READ BACK BY AND VERIFIED WITH: M LILLISTON,PHARMD@0450  12/30/20 [place]    Staphylococcus epidermidis DETECTED (A) NOT DETECTED Final    Comment: CRITICAL RESULT CALLED TO, READ BACK BY AND VERIFIED WITH: M LILLISTON,PHARMD@0453  12/30/20 [place]    Staphylococcus lugdunensis NOT DETECTED NOT DETECTED Final   Streptococcus species NOT DETECTED NOT DETECTED Final   Streptococcus agalactiae NOT DETECTED NOT DETECTED Final   Streptococcus pneumoniae NOT DETECTED NOT DETECTED Final   Streptococcus pyogenes NOT DETECTED NOT DETECTED Final   A.calcoaceticus-baumannii NOT DETECTED NOT DETECTED Final   Bacteroides fragilis NOT DETECTED NOT DETECTED Final   Enterobacterales NOT DETECTED NOT DETECTED Final   Enterobacter cloacae complex NOT DETECTED NOT DETECTED Final   Escherichia coli NOT DETECTED NOT DETECTED Final   Klebsiella aerogenes NOT DETECTED NOT DETECTED Final   Klebsiella oxytoca NOT DETECTED NOT DETECTED Final   Klebsiella pneumoniae NOT DETECTED NOT DETECTED Final   Proteus species NOT DETECTED NOT DETECTED Final   Salmonella species NOT DETECTED NOT DETECTED Final   Serratia marcescens NOT DETECTED NOT DETECTED Final   Haemophilus influenzae NOT DETECTED NOT DETECTED Final   Neisseria meningitidis NOT DETECTED NOT DETECTED Final   Pseudomonas aeruginosa NOT DETECTED NOT DETECTED Final   Stenotrophomonas maltophilia NOT DETECTED NOT DETECTED Final   Candida albicans NOT DETECTED NOT DETECTED Final   Candida auris NOT DETECTED NOT DETECTED Final   Candida glabrata NOT DETECTED NOT DETECTED Final   Candida krusei NOT DETECTED NOT DETECTED Final   Candida parapsilosis NOT DETECTED NOT DETECTED Final   Candida tropicalis NOT DETECTED NOT DETECTED Final   Cryptococcus neoformans/gattii NOT DETECTED NOT DETECTED Final   Methicillin resistance mecA/C DETECTED (A) NOT  DETECTED  Final    Comment: CRITICAL RESULT CALLED TO, READ BACK BY AND VERIFIED WITH: M LILLISTON,PHARMD@0453  12/30/20 [place]    Meth resistant mecA/C and MREJ NOT DETECTED NOT DETECTED Final    Comment: Performed at [facility], [place] [address], [place], [place] [address]  MRSA Next Gen by PCR, Nasal     Status: None   Collection Time: 12/29/20 11:04 PM   Specimen: Nasal Mucosa; Nasal Swab  Result Value Ref Range Status   MRSA by PCR Next Gen NOT DETECTED NOT DETECTED Final    Comment: (NOTE) The GeneXpert MRSA Assay (FDA approved for NASAL specimens only), is one component of a comprehensive MRSA colonization surveillance program. It is not intended to diagnose MRSA infection nor to guide or monitor treatment for MRSA infections. Test performance is not FDA approved in patients less than 57 years old. Performed at [facility], [place] [address], [place], [place] [address]   Culture, blood (routine x 2)     Status: None   Collection Time: 12/30/20  3:34 PM   Specimen: BLOOD RIGHT HAND  Result Value Ref Range Status   Specimen Description   Final    BLOOD RIGHT HAND Performed at [place] [address], [place], [place] [address]    Special Requests   Final    BOTTLES DRAWN AEROBIC ONLY Blood Culture results may not be optimal due to an inadequate volume of blood received in culture bottles Performed at [place] [address] [place], [place] [address]    Culture   Final    NO GROWTH 5 DAYS Performed at [facility], [place] [address], [place], [place] [address]    Report Status 01/04/2021 FINAL  Final  Culture, blood (routine x 2)     Status: None   Collection Time: 12/30/20  3:34 PM   Specimen: BLOOD  Result Value Ref Range Status   Specimen Description   Final    BLOOD LEFT ANTECUBITAL Performed at [place] [address] [place], [place] [address]    Special Requests   Final     BOTTLES DRAWN AEROBIC ONLY Blood Culture results may not be optimal due to an inadequate volume of blood received in culture bottles Performed at [facility] [address], [place], [place] [address]    Culture   Final    NO GROWTH 5 DAYS Performed at [facility], [place] [address], [place], [place] [address]    Report Status 01/04/2021 FINAL  Final  Expectorated Sputum Assessment w Gram Stain, Rflx to Resp Cult     Status: None   Collection Time: 12/31/20  8:35 AM   Specimen: Expectorated Sputum  Result Value Ref Range Status   Specimen Description EXPECTORATED SPUTUM  Final   Special Requests NONE  Final   Sputum evaluation   Final    THIS SPECIMEN IS ACCEPTABLE FOR SPUTUM CULTURE Performed at [facility], [place] [address], [place], [place] [address]    Report Status 12/31/2020 FINAL  Final  Culture, Respiratory w Gram Stain     Status: None   Collection Time: 12/31/20  8:35 AM  Result Value Ref Range Status   Specimen Description   Final    EXPECTORATED SPUTUM Performed at [facility], [place] [address] [place],  [address]    Special Requests   Final    NONE Reflexed from [id number]  Performed at [facility], [place] [address] [place], [place] [address]    Gram Stain   Final    RARE SQUAMOUS EPITHELIAL CELLS PRESENT RARE WBC PRESENT,BOTH PMN AND MONONUCLEAR NO ORGANISMS SEEN Performed at [facility], [place] [address] [place], [place] [address]    Culture RARE STAPHYLOCOCCUS AUREUS  Final   Report Status 01/03/2021 FINAL  Final   Organism ID, Bacteria STAPHYLOCOCCUS AUREUS  Final      Susceptibility   Staphylococcus aureus - MIC*    CIPROFLOXACIN <=0.5 SENSITIVE Sensitive     ERYTHROMYCIN <=0.25 SENSITIVE Sensitive     GENTAMICIN <=0.5 SENSITIVE Sensitive     OXACILLIN 0.5 SENSITIVE Sensitive     TETRACYCLINE <=1 SENSITIVE Sensitive     VANCOMYCIN <=0.5 SENSITIVE Sensitive      TRIMETH/SULFA <=10 SENSITIVE Sensitive     CLINDAMYCIN <=0.25 SENSITIVE Sensitive     RIFAMPIN <=0.5 SENSITIVE Sensitive     Inducible Clindamycin NEGATIVE Sensitive     * RARE STAPHYLOCOCCUS AUREUS  Aerobic/Anaerobic Culture w Gram Stain (surgical/deep wound)     Status: None (Preliminary result)   Collection Time: 01/01/21  1:17 PM   Specimen: PATH Other; Tissue  Result Value Ref Range Status   Specimen Description   Final    WOUND INFECTED INFUSION PORT SITE Performed at [place] [address] [place], [place] [address]    Special Requests   Final    NONE Performed at [facility], [place] [address] [place], [place] [address]    Gram Stain   Final    FEW WBC PRESENT, PREDOMINANTLY MONONUCLEAR NO ORGANISMS SEEN    Culture   Final    NO GROWTH 3 DAYS NO ANAEROBES ISOLATED; CULTURE IN PROGRESS FOR 5 DAYS Performed at [place] [address] [place], [place] [address]    Report Status PENDING  Incomplete  Culture, blood (routine x 2)     Status: None (Preliminary result)   Collection Time: 01/02/21  2:18 AM   Specimen: BLOOD RIGHT HAND  Result Value Ref Range Status   Specimen Description   Final    BLOOD RIGHT HAND Performed at [facility] [address] [place], [place] [address]    Special Requests   Final    BOTTLES DRAWN AEROBIC ONLY Blood Culture adequate volume Performed at [place] [address] [place], [place] [address]    Culture   Final    NO GROWTH 2 DAYS Performed at [place] [address], [place], [place] [address]    Report Status PENDING  Incomplete  Culture, blood (routine x 2)     Status: None (Preliminary result)   Collection Time: 01/02/21  2:18 AM   Specimen: Left Antecubital; Blood  Result Value Ref Range Status   Specimen Description   Final    LEFT ANTECUBITAL BLOOD Performed at [facility], [place] [address],  [place], [place] [address]    Special Requests   Final    BOTTLES DRAWN AEROBIC ONLY Blood Culture results may not be optimal due to an inadequate volume of blood received in culture bottles Performed at [place] [address], [place], [place] [address]    Culture   Final    NO GROWTH 2 DAYS Performed at [place] [address] [place], [place] [address]    Report Status PENDING  Incomplete  Serology:   Imaging: If present, new imagings (plain films, ct scans, and mri) have been personally visualized and interpreted; radiology reports have been reviewed. Decision making incorporated into the Impression / Recommendations.  11/13 tte No evidence of valvular vegetations on  this transthoracic echocardiogram. Consider a transesophageal  echocardiogram to exclude infective endocarditis if clinically indicated  11/18 tee No evidence endocarditis  Jabier Mutton, [place] for [place] [phone] pager    01/04/2021, 5:05 PM

## 2021-01-04 NOTE — Progress Notes (Signed)
  Echocardiogram Echocardiogram Transesophageal has been performed.  Darlina Sicilian M 01/04/2021, 9:37 AM

## 2021-01-04 NOTE — Anesthesia Preprocedure Evaluation (Addendum)
Anesthesia Evaluation  Patient identified by MRN, date of birth, ID band Patient awake    Reviewed: Allergy & Precautions, NPO status , Patient's Chart, lab work & pertinent test results, reviewed documented beta blocker date and time   Airway Mallampati: I  TM Distance: >3 FB Neck ROM: Full    Dental  (+) Edentulous Upper, Edentulous Lower   Pulmonary neg pulmonary ROS, former smoker,  Metastatic lung CA currently on chemo, brain mets   Pulmonary exam normal breath sounds clear to auscultation       Cardiovascular hypertension, Pt. on home beta blockers and Pt. on medications + Past MI  Normal cardiovascular exam+ dysrhythmias (on eliquis) Atrial Fibrillation  Rhythm:Regular Rate:Normal  TTE 2022 1. Left ventricular ejection fraction, by estimation, is 60 to 65%. The  left ventricle has normal function. The left ventricle has no regional  wall motion abnormalities. Left ventricular diastolic parameters were  normal.  2. Right ventricular systolic function is normal. The right ventricular  size is normal.  3. A small pericardial effusion is present. The pericardial effusion is  posterior to the left ventricle. There is no evidence of cardiac  tamponade.  4. The mitral valve is normal in structure. Mild mitral valve  regurgitation. No evidence of mitral stenosis.  5. The aortic valve is normal in structure. Aortic valve regurgitation is  not visualized. No aortic stenosis is present.  6. The inferior vena cava is normal in size with greater than 50%  respiratory variability, suggesting right atrial pressure of 3 mmHg.    Neuro/Psych Anxiety negative neurological ROS     GI/Hepatic negative GI ROS, Neg liver ROS,   Endo/Other  Hypothyroidism   Renal/GU   negative genitourinary   Musculoskeletal   Abdominal   Peds  Hematology  (+) Blood dyscrasia, anemia , Lab Results      Component                Value                Date                      WBC                      12.4 (H)            01/01/2021                HGB                      7.4 (L)             01/01/2021                HCT                      24.3 (L)            01/01/2021                MCV                      98.0                01/01/2021                PLT  184                 01/01/2021              Anesthesia Other Findings   Reproductive/Obstetrics                            Anesthesia Physical  Anesthesia Plan  ASA: 3  Anesthesia Plan: MAC   Post-op Pain Management:    Induction: Intravenous  PONV Risk Score and Plan: 2 and Ondansetron and Dexamethasone  Airway Management Planned: Natural Airway, Nasal Cannula, Simple Face Mask and Mask  Additional Equipment: None  Intra-op Plan:   Post-operative Plan: Extubation in OR  Informed Consent: I have reviewed the patients History and Physical, chart, labs and discussed the procedure including the risks, benefits and alternatives for the proposed anesthesia with the patient or authorized representative who has indicated his/her understanding and acceptance.     Dental advisory given  Plan Discussed with: CRNA and Anesthesiologist  Anesthesia Plan Comments: Alan [name] is a 70 y.o. male with medical history significant for metastatic lung cancer on active chemotherapy, s/p right frontal craniotomy for metastatic tumor resection, paroxysmal atrial fibrillation on Eliquis, hypertension, hypothyroidism who presented to [facility] ED for evaluation of shortness of breath.)       Anesthesia Quick Evaluation

## 2021-01-04 NOTE — Progress Notes (Signed)
PHARMACY CONSULT NOTE FOR:  OUTPATIENT  PARENTERAL ANTIBIOTIC THERAPY (OPAT)  Indication: MSSA bacteremia  Regimen: Cefazolin 2 gm IV Q 8 hours End date: 01/15/21  Patient completing Vancomycin 11/19 AM.  After Cefazolin 11/20-29, patient will complete 2 weeks of linezolid 11/30 - 12/14  IV antibiotic discharge orders are pended. To discharging provider:  please sign these orders via discharge navigator,  Select New Orders & click on the button choice - Manage This Unsigned Work.     Thank you for allowing pharmacy to be a part of this patient's care.  Jimmy Footman, PharmD, BCPS, [place] Clinical Pharmacist Phone: [phone] 01/04/2021, 2:55 PM

## 2021-01-04 NOTE — Anesthesia Procedure Notes (Signed)
Procedure Name: MAC Date/Time: 01/04/2021 9:04 AM Performed by: Lowella Dell, CRNA Pre-anesthesia Checklist: Patient identified, Emergency Drugs available, Suction available, Patient being monitored and Timeout performed Patient Re-evaluated:Patient Re-evaluated prior to induction Oxygen Delivery Method: Nasal cannula Preoxygenation: Pre-oxygenation with 100% oxygen Induction Type: IV induction Placement Confirmation: positive ETCO2

## 2021-01-05 DIAGNOSIS — I4891 Unspecified atrial fibrillation: Secondary | ICD-10-CM

## 2021-01-05 LAB — CBC WITH DIFFERENTIAL/PLATELET
Abs Immature Granulocytes: 0.5 10*3/uL — ABNORMAL HIGH (ref 0.00–0.07)
Basophils Absolute: 0 10*3/uL (ref 0.0–0.1)
Basophils Relative: 0 %
Blasts: 1 %
Eosinophils Absolute: 0 10*3/uL (ref 0.0–0.5)
Eosinophils Relative: 0 %
HCT: 26.9 % — ABNORMAL LOW (ref 39.0–52.0)
Hemoglobin: 8.5 g/dL — ABNORMAL LOW (ref 13.0–17.0)
Lymphocytes Relative: 3 %
Lymphs Abs: 0.5 10*3/uL — ABNORMAL LOW (ref 0.7–4.0)
MCH: 30.6 pg (ref 26.0–34.0)
MCHC: 31.6 g/dL (ref 30.0–36.0)
MCV: 96.8 fL (ref 80.0–100.0)
Metamyelocytes Relative: 1 %
Monocytes Absolute: 0.5 10*3/uL (ref 0.1–1.0)
Monocytes Relative: 3 %
Myelocytes: 2 %
Neutro Abs: 15.7 10*3/uL — ABNORMAL HIGH (ref 1.7–7.7)
Neutrophils Relative %: 90 %
Platelets: 205 10*3/uL (ref 150–400)
RBC: 2.78 MIL/uL — ABNORMAL LOW (ref 4.22–5.81)
RDW: 17.2 % — ABNORMAL HIGH (ref 11.5–15.5)
WBC: 17.4 10*3/uL — ABNORMAL HIGH (ref 4.0–10.5)
nRBC: 4.1 % — ABNORMAL HIGH (ref 0.0–0.2)

## 2021-01-05 LAB — CULTURE, BLOOD (ROUTINE X 2): Special Requests: ADEQUATE

## 2021-01-05 LAB — GLUCOSE, CAPILLARY
Glucose-Capillary: 80 mg/dL (ref 70–99)
Glucose-Capillary: 98 mg/dL (ref 70–99)

## 2021-01-05 MED ORDER — PREDNISONE 10 MG PO TABS: 40.0000 mg | ORAL_TABLET | Freq: Every day | ORAL | 0 refills | Status: AC

## 2021-01-05 MED ORDER — ADULT MULTIVITAMIN W/MINERALS CH: 1.0000 | ORAL_TABLET | Freq: Every day | ORAL | Status: AC

## 2021-01-05 MED ORDER — PANTOPRAZOLE SODIUM 40 MG PO TBEC: 40.0000 mg | DELAYED_RELEASE_TABLET | Freq: Every day | ORAL | 1 refills | Status: DC

## 2021-01-05 MED ORDER — ENSURE ENLIVE PO LIQD: 237.0000 mL | Freq: Three times a day (TID) | ORAL | 12 refills | Status: AC

## 2021-01-05 NOTE — Discharge Summary (Signed)
Physician Discharge Summary  TRANQUILINO FISCHLER SKA:[id number] DOB: 08-27-1950 DOA: 12/29/2020  PCP: Sandria Manly [place] date: 20/35/5974 Discharge date: 01/05/2021  Admitted From: Home.  Disposition:  Home.   Recommendations for Outpatient Follow-up:  Follow up with PCP in 1-2 weeks Please obtain BMP/CBC in one week Please follow up with Infectious Disease after antibiotics.   Discharge Condition: stable.  CODE STATUS:full code.  Diet recommendation: Heart Healthy   Brief/Interim Summary: 70 year old gentleman with prior history of metastatic lung cancer on active chemotherapy s/p right frontal craniotomy for metastatic tumor resection, paroxysmal atrial fibrillation on Eliquis, hypertension, hypothyroidism initially presented to [facility] for evaluation of shortness of breath.  Chest x-ray showed opacity in the left apex . patient was also found to be in atrial fibrillation underwent cardioversion in the ED and returned to sinus rhythm. Hospital course complicated by staph aureus bacteremia. ID consulted and recommendations given.  Cardiology consulted for TEE, TEE is done and is negative for vegetations.   Discharge Diagnoses:  Principal Problem:   Acute respiratory failure with hypoxia (HCC) Active Problems:   Paroxysmal atrial fibrillation with RVR (HCC)   Lung cancer (HCC)   AKI (acute kidney injury) ([place])   Anemia associated with chemotherapy   Hypotension   Staphylococcus aureus bacteremia   Malignancy ([place])   Bloodstream infection due to central venous catheter  Acute respiratory failure with hypoxia probably secondary to community-acquired pneumonia/chemotherapy pneumonitis. Patient initially required about 15 L of high flow nasal cannula oxygen currently weaned off and is patient is on room air at this time.  Complete the course of antibiotics. Currently on IV vancomycin, to complete the course on 01/06/21, then switch to cefazolin to complete the  MSSA treatment.  Taper prednisone in the next 2 weeks per PCCM. Continue with bronchodilators as needed, incentive spirometry and nasal cannula oxygen.     Sepsis secondary to staph /epidermidis bacteremia Present on admission. Repeat blood cultures negative and pending so far Infectious disease on board recommending TEE Cardiology consulted for TEE, negative for vegetations.  Low grade temp earlier this am and worsening leukocytosis . Worsening leukocytosis possibly from steroids.  Possible source Port-A-Cath which was removed by general surgery.       Paroxysmal atrial fibrillation with RVR s/p cardioversion in ED currently in sinus rhythm On Eliquis for anticoagulation. Rate controlled by metoprolol 25 mg BID.      Hypotension probably secondary to sepsis which has been resolved.     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 Follows up with Dr. Delton Coombes oncologist. Patient currently on active chemotherapy.     Hypothyroidism Continue with Synthroid.     Anemia of chronic disease/chemo associated anemia Transfuse to keep hemoglobin greater than 7. Hemoglobin stable around 8.      Anxiety Continue with Xanax.         Mild AKI probably secondary to sepsis Resolved.     Mild hyponatremia:  Sodium is 132.            Discharge Instructions  Discharge Instructions     [facility] pharmacist to adjust dose for Vancomycin, Aminoglycosides and other anti-infective therapies as requested by physician.   Complete by: As directed    [facility] to provide Cath Flo 7m   Complete by: As directed    Administer for PICC line occlusion and as ordered by physician for other access device issues.   Anaphylaxis Kit: Provided  to treat any anaphylactic reaction to the medication being provided to the patient if First Dose or when requested by physician   Complete by: As  directed    Epinephrine 79m/ml vial / amp: Administer 0.362m(0.99m82msubcutaneously once for moderate to severe anaphylaxis, nurse to call physician and pharmacy when reaction occurs and call 911 if needed for immediate care   Diphenhydramine 49m77m IV vial: Administer 25-49mg39mIM PRN for first dose reaction, rash, itching, mild reaction, nurse to call physician and pharmacy when reaction occurs   Sodium Chloride 0.9% NS 500ml 34mAdminister if needed for hypovolemic blood pressure drop or as ordered by physician after call to physician with anaphylactic reaction   Change dressing on IV access line weekly and PRN   Complete by: As directed    Diet - low sodium heart healthy   Complete by: As directed    Discharge instructions   Complete by: As directed    Please follow up with Pulmonology before the steroids are completed.   Flush IV access with Sodium Chloride 0.9% and Heparin 10 units/ml or 100 units/ml   Complete by: As directed    Home infusion instructions - [facility]   Complete by: As directed    Instructions: Flush IV access with Sodium Chloride 0.9% and Heparin 10units/ml or 100units/ml   Change dressing on IV access line: Weekly and PRN   Instructions Cath Flo 2mg: A35mnister for PICC Line occlusion and as ordered by physician for other access device   [facility] pharmacist to adjust dose for: Vancomycin, Aminoglycosides and other anti-infective therapies as requested by physician   Increase activity slowly   Complete by: As directed    Method of administration may be changed at the discretion of home infusion pharmacist based upon assessment of the patient and/or caregiver's ability to self-administer the medication ordered   Complete by: As directed    No wound care   Complete by: As directed       Allergies as of 01/05/2021   No Known Allergies      Medication List     STOP taking these medications    HYDROcodone bit-homatropine 5-1.5  MG/5ML syrup Commonly known as: Hycodan   Melatonin 10 MG Tabs   methylphenidate 5 MG tablet Commonly known as: Ritalin       TAKE these medications    acetaminophen 500 MG tablet Commonly known as: TYLENOL Take 500 mg by mouth every 6 (six) hours as needed for moderate pain or headache.   albuterol 108 (90 Base) MCG/ACT inhaler Commonly known as: VENTOLIN HFA Inhale 1-2 puffs into the lungs every 6 (six) hours as needed for wheezing or shortness of breath.   ALPRAZolam 0.25 MG tablet Commonly known as: XANAX TAKE 1 TABLET BY MOUTH THREE TIMES DAILY AS NEEDED FOR ANXIETY   ceFAZolin  IVPB Commonly known as: ANCEF Inject 2 g into the vein every 8 (eight) hours for 9 days. Indication:  MSSA bacteremia First Dose: Yes Last Day of Therapy:  01/15/21  Labs - Once weekly:  CBC/D and BMP, Labs - Every other week:  ESR and CRP Method of administration: IV Push Method of administration may be changed at the discretion of home infusion pharmacist based upon assessment of the patient and/or caregiver's ability to self-administer the medication ordered. Start taking on: January 06, 2021   dextromethorphan 30 MG/5ML liquid Commonly known as: DELSYM Take 30 mg by mouth as needed for cough.   Eliquis 5  MG Tabs tablet Generic drug: apixaban Take 1 tablet by mouth twice daily   feeding supplement Liqd Take 237 mLs by mouth 3 (three) times daily between meals.   levothyroxine 100 MCG tablet Commonly known as: Synthroid Take 1 tablet (100 mcg total) by mouth daily before breakfast.   lidocaine 2 % solution Commonly known as: XYLOCAINE TAKE 15 ML BY MOUTH 4 TIMES DAILY WITH MEALS   lidocaine-prilocaine cream Commonly known as: EMLA APPLY SMALL AMOUNT TO PORTA-CATH SITE AND COVER WITH PLASTIC WRAP 1 HOUR PRIOR TO CHEMO APPOINTMENTS.   linezolid 600 MG tablet Commonly known as: ZYVOX Take 1 tablet (600 mg total) by mouth 2 (two) times daily for 14 days. Start after completion  of IV antibiotics- Start date 01/16/21 Start taking on: January 16, 2021   megestrol 400 MG/10ML suspension Commonly known as: MEGACE Take 10 mLs (400 mg total) by mouth 2 (two) times daily.   metoprolol tartrate 25 MG tablet Commonly known as: LOPRESSOR Take 1 tablet (25 mg total) by mouth 2 (two) times daily.   multivitamin with minerals Tabs tablet Take 1 tablet by mouth daily. Start taking on: January 06, 2021   pantoprazole 40 MG tablet Commonly known as: PROTONIX Take 1 tablet (40 mg total) by mouth daily. Start taking on: January 06, 2021   predniSONE 10 MG tablet Commonly known as: DELTASONE Take 4 tablets (40 mg total) by mouth daily for 7 days.               Discharge Care Instructions  (From admission, onward)           Start     Ordered   01/04/21 0000  Change dressing on IV access line weekly and PRN  (Home infusion instructions - [facility] )        01/04/21 1624            No Known Allergies  Consultations: ID Surgery    Procedures/Studies: CT Chest W Contrast  Result Date: 12/15/2020 CLINICAL DATA:  Follow up left upper lobe squamous cell lung cancer EXAM: CT CHEST WITH CONTRAST TECHNIQUE: Multidetector CT imaging of the chest was performed during intravenous contrast administration. CONTRAST:  27m OMNIPAQUE IOHEXOL 300 MG/ML  SOLN COMPARISON:  PET/CT, 09/20/2020, CT chest abdomen pelvis, 09/05/2020 FINDINGS: Cardiovascular: Right chest port catheter. Aortic atherosclerosis. Normal heart size. Three vessel coronary artery calcifications. No pericardial effusion. Mediastinum/Nodes: Previously FDG avid soft tissue nodule in the AP window is slightly enlarged, measuring 3.2 x 2.3 cm, previously 2.4 x 1.5 cm when measured similarly (series 2, image 47). Additional previously FDG avid left hilar nodule is slightly enlarged, measuring 1.9 x 1.8 cm, previously 1.6 x 1.4 cm (series 2, image 60). Thyroid gland, trachea, and esophagus  demonstrate no significant findings. Lungs/Pleura: Interval resolution of previously seen left-sided pneumothorax component, with small, residual loculated pleural effusion components. Otherwise unchanged post treatment/post radiation appearance of the left lung, with extensive perihilar and paramedian left upper lobe fibrosis and consolidation. Unchanged 0.8 cm subpleural nodule of the left lower lobe, previously hypermetabolic. There is new, irregular, scattered ground-glass opacity throughout the right lung (series 4, image 59). Diffuse bilateral bronchial wall thickening. Upper Abdomen: No acute abnormality. Musculoskeletal: No chest wall mass or suspicious bone lesions identified. IMPRESSION: 1. Previously FDG avid soft tissue nodule in the AP window is slightly enlarged, measuring 3.2 x 2.3 cm, previously 2.4 x 1.5 cm when measured similarly. 2. Additional previously FDG avid left hilar nodule is slightly  enlarged, measuring 1.9 x 1.8 cm, previously 1.6 x 1.4 cm. 3. Findings are consistent with worsened metastatic disease. 4. Unchanged 0.8 cm subpleural nodule of the left lower lobe, previously hypermetabolic and consistent with a small metastasis. 5. Interval resolution of previously seen left-sided pneumothorax component, with small, residual loculated left pleural effusion components. 6. Otherwise unchanged post treatment/post radiation appearance of the left lung, with extensive perihilar and paramedian left upper lobe fibrosis and consolidation. 7. There is new, irregular, scattered ground-glass opacity throughout the right lung, nonspecific and infectious or inflammatory. Differential considerations include drug toxicity in the setting of chemotherapy. 8. Coronary artery disease. Aortic Atherosclerosis (ICD10-I70.0). Electronically Signed   By: Delanna Ahmadi M.D.   On: 12/15/2020 09:36   DG Chest Port 1 View  Result Date: 12/29/2020 CLINICAL DATA:  Shortness of breath EXAM: PORTABLE CHEST 1 VIEW  COMPARISON:  Previous studies including the examination of radiographs done on 11/01/2019 and CT done on 09/05/2020 FINDINGS: Transverse diameter of heart is increased. There is homogeneous opacification in the left apex. There are patchy infiltrates in the left mid and left lower lung fields. There is blunting of left lateral CP angle. Right lung is clear. There is no pneumothorax. IMPRESSION: There is homogeneous opacity in the left apex. There are patchy infiltrates in the left mid and left lower lung fields and prominence of left hilum. Findings suggest pneumonia with possible underlying neoplastic process. Small left pleural effusion is seen. Electronically Signed   By: Elmer Picker M.D.   On: 12/29/2020 11:10   ECHOCARDIOGRAM COMPLETE  Result Date: 12/30/2020    ECHOCARDIOGRAM REPORT   Patient Name:   OVID WITMAN Date of Exam: 12/30/2020 Medical Rec #:  [id number]      Height:       64.0 in Accession #:    [id number]     Weight:       159.6 lb Date of Birth:  07/05/1950      BSA:          1.778 m Patient Age:    33 years       BP:           106/57 mmHg Patient Gender: M              HR:           71 bpm. Exam Location:  Inpatient Procedure: 2D Echo, Cardiac Doppler and Color Doppler Indications:    Bacteremia  History:        Patient has prior history of Echocardiogram examinations, most                 recent 10/22/2019. Arrythmias:Atrial Fibrillation,                 Signs/Symptoms:Hypotension; Risk Factors:Hyperlipidemia.  Sonographer:    Glo Herring Referring Phys: [id number] [place]  1. Left ventricular ejection fraction, by estimation, is 60 to 65%. The left ventricle has normal function. The left ventricle has no regional wall motion abnormalities. Left ventricular diastolic parameters were normal.  2. Right ventricular systolic function is normal. The right ventricular size is normal.  3. A small pericardial effusion is present. The pericardial effusion is posterior to the  left ventricle. There is no evidence of cardiac tamponade.  4. The mitral valve is normal in structure. Mild mitral valve regurgitation. No evidence of mitral stenosis.  5. The aortic valve is normal in structure. Aortic valve regurgitation is not visualized. No aortic stenosis  is present.  6. The inferior vena cava is normal in size with greater than 50% respiratory variability, suggesting right atrial pressure of 3 mmHg. Comparison(s): Prior images reviewed side by side. Small pericardial effusion seen on prior 2021. Conclusion(s)/Recommendation(s): No evidence of valvular vegetations on this transthoracic echocardiogram. Consider a transesophageal echocardiogram to exclude infective endocarditis if clinically indicated. FINDINGS  Left Ventricle: Left ventricular ejection fraction, by estimation, is 60 to 65%. The left ventricle has normal function. The left ventricle has no regional wall motion abnormalities. The left ventricular internal cavity size was normal in size. There is  no left ventricular hypertrophy. Left ventricular diastolic parameters were normal. Right Ventricle: The right ventricular size is normal. No increase in right ventricular wall thickness. Right ventricular systolic function is normal. Left Atrium: Left atrial size was normal in size. Right Atrium: Right atrial size was normal in size. Pericardium: A small pericardial effusion is present. The pericardial effusion is posterior to the left ventricle. There is no evidence of cardiac tamponade. Mitral Valve: The mitral valve is normal in structure. Mild mitral valve regurgitation. No evidence of mitral valve stenosis. Tricuspid Valve: The tricuspid valve is normal in structure. Tricuspid valve regurgitation is mild . No evidence of tricuspid stenosis. Aortic Valve: The aortic valve is normal in structure. Aortic valve regurgitation is not visualized. No aortic stenosis is present. Aortic valve mean gradient measures 2.0 mmHg. Aortic valve  peak gradient measures 3.8 mmHg. Aortic valve area, by VTI measures 1.95 cm. Pulmonic Valve: The pulmonic valve was normal in structure. Pulmonic valve regurgitation is trivial. No evidence of pulmonic stenosis. Aorta: The aortic root is normal in size and structure. Venous: The inferior vena cava is normal in size with greater than 50% respiratory variability, suggesting right atrial pressure of 3 mmHg. IAS/Shunts: No atrial level shunt detected by color flow Doppler.  LEFT VENTRICLE PLAX 2D LVIDd:         4.20 cm   Diastology LVIDs:         3.00 cm   LV e' medial:    6.53 cm/s LV PW:         1.00 cm   LV E/e' medial:  16.2 LV IVS:        1.00 cm   LV e' lateral:   8.10 cm/s LVOT diam:     2.00 cm   LV E/e' lateral: 13.1 LV SV:         42 LV SV Index:   24 LVOT Area:     3.14 cm  RIGHT VENTRICLE            IVC RV Basal diam:  3.60 cm    IVC diam: 1.90 cm RV S prime:     9.53 cm/s LEFT ATRIUM             Index        RIGHT ATRIUM           Index LA diam:        3.80 cm 2.14 cm/m   RA Area:     17.10 cm LA Vol (A2C):   55.2 ml 31.05 ml/m  RA Volume:   41.40 ml  23.29 ml/m LA Vol (A4C):   60.2 ml 33.87 ml/m LA Biplane Vol: 60.8 ml 34.20 ml/m  AORTIC VALVE                    PULMONIC VALVE AV Area (Vmax):    2.12 cm  PV Vmax:       0.63 m/s AV Area (Vmean):   2.13 cm     PV Peak grad:  1.6 mmHg AV Area (VTI):     1.95 cm AV Vmax:           97.90 cm/s AV Vmean:          67.400 cm/s AV VTI:            0.217 m AV Peak Grad:      3.8 mmHg AV Mean Grad:      2.0 mmHg LVOT Vmax:         66.10 cm/s LVOT Vmean:        45.700 cm/s LVOT VTI:          0.135 m LVOT/AV VTI ratio: 0.62  AORTA Ao Root diam: 3.30 cm Ao Asc diam:  2.60 cm MITRAL VALVE MV Area (PHT): 4.57 cm     SHUNTS MV Decel Time: 166 msec     Systemic VTI:  0.14 m MV E velocity: 106.00 cm/s  Systemic Diam: 2.00 cm MV A velocity: 42.40 cm/s MV E/A ratio:  2.50 Candee Furbish MD Electronically signed by Candee Furbish MD Signature Date/Time:  12/30/2020/1:02:27 PM    Final    ECHO TEE  Result Date: 01/04/2021    TRANSESOPHOGEAL ECHO REPORT   Patient Name:   MUHANNAD BIGNELL Date of Exam: 01/04/2021 Medical Rec #:  [id number]      Height:       65.0 in Accession #:    [id number]     Weight:       155.6 lb Date of Birth:  [birth year]      BSA:          1.778 m Patient Age:    70 years       BP:           111/50 mmHg Patient Gender: M              HR:           68 bpm. Exam Location:  Inpatient Procedure: Transesophageal Echo, Color Doppler and Cardiac Doppler Indications:     Bacteremia  History:         Patient has prior history of Echocardiogram examinations, most                  recent 12/30/2020. Staphylococcus aureus bacteremia,                  Hypotension, Pneumonia/ Chemotherapy pneumonitis, Lung cancer.                  History of Afib.  Sonographer:     Darlina Sicilian RDCS Referring Phys:  [id number] RHONDA G BARRETT Diagnosing Phys: Kirk Ruths MD PROCEDURE: After discussion of the risks and benefits of a TEE, an informed consent was obtained from the patient. TEE procedure time was 6 minutes. The transesophogeal probe was passed without difficulty through the esophogus of the patient. Imaged were  obtained with the patient in a left lateral decubitus position. Local oropharyngeal anesthetic was provided with Cetacaine. Sedation performed by different physician. The patient was monitored while under deep sedation. Anesthestetic sedation was provided intravenously by Anesthesiology: 116.66m of Propofol. Image quality was good. The patient's vital signs; including heart rate, blood pressure, and oxygen saturation; remained stable throughout the procedure. The patient developed no complications during the procedure. IMPRESSIONS  1. No vegetations.  2. Left ventricular ejection  fraction, by estimation, is 55 to 60%. The left ventricle has normal function.  3. Right ventricular systolic function is normal. The right ventricular size is normal.  4.  Left atrial size was moderately dilated. No left atrial/left atrial appendage thrombus was detected.  5. A small pericardial effusion is present.  6. The mitral valve is normal in structure. Mild mitral valve regurgitation.  7. The aortic valve is normal in structure. Aortic valve regurgitation is not visualized.  8. There is mild (Grade II) plaque involving the descending aorta. FINDINGS  Left Ventricle: Left ventricular ejection fraction, by estimation, is 55 to 60%. The left ventricle has normal function. The left ventricular internal cavity size was normal in size. Right Ventricle: The right ventricular size is normal. Right vetricular wall thickness was not assessed. Right ventricular systolic function is normal. Left Atrium: Left atrial size was moderately dilated. No left atrial/left atrial appendage thrombus was detected. Right Atrium: Right atrial size was normal in size. Pericardium: A small pericardial effusion is present. Mitral Valve: The mitral valve is normal in structure. Mild mitral valve regurgitation. Tricuspid Valve: The tricuspid valve is normal in structure. Tricuspid valve regurgitation is mild. Aortic Valve: The aortic valve is normal in structure. Aortic valve regurgitation is not visualized. Pulmonic Valve: The pulmonic valve was normal in structure. Pulmonic valve regurgitation is trivial. Aorta: The aortic root is normal in size and structure. There is mild (Grade II) plaque involving the descending aorta. IAS/Shunts: No atrial level shunt detected by color flow Doppler. Additional Comments: No vegetations.   AORTA Ao Root diam: 3.30 cm Ao Asc diam:  2.50 cm TRICUSPID VALVE TR Peak grad:   31.8 mmHg TR Vmax:        282.00 cm/s Kirk Ruths MD Electronically signed by Kirk Ruths MD Signature Date/Time: 01/04/2021/11:03:04 AM    Final    [name] EKG SITE RITE  Result Date: 01/04/2021 If Site Rite image not attached, placement could not be confirmed due to current cardiac rhythm.      Subjective: No new complaints.   Discharge Exam: Vitals:   01/05/21 0529 01/05/21 1400  BP: (!) 122/52 (!) 129/56  Pulse: (!) 54 62  Resp: 16 20  Temp: (!) 97.4 F (36.3 C) 98.1 F (36.7 C)  SpO2: 99% 100%   Vitals:   01/04/21 1257 01/04/21 2121 01/05/21 0529 01/05/21 1400  BP: (!) 123/53 118/63 (!) 122/52 (!) 129/56  Pulse: 62 61 (!) 54 62  Resp: 18 20 16 20   Temp: 98.4 F (36.9 C) 98.4 F (36.9 C) (!) 97.4 F (36.3 C) 98.1 F (36.7 C)  TempSrc: Oral Oral Oral Oral  SpO2: 97% 98% 99% 100%  Weight:   70.2 kg   Height:        General: Pt is alert, awake, not in acute distress Cardiovascular: RRR, S1/S2 +, no rubs, no gallops Respiratory: CTA bilaterally, no wheezing, no rhonchi Abdominal: Soft, NT, ND, bowel sounds + Extremities: no edema, no cyanosis    The results of significant diagnostics from this hospitalization (including imaging, microbiology, ancillary and laboratory) are listed below for reference.     Microbiology: Recent Results (from the past 240 hour(s))  Resp Panel by RT-PCR (Flu A&B, Covid) Nasopharyngeal Swab     Status: None   Collection Time: 12/29/20 10:12 AM   Specimen: Nasopharyngeal Swab; Nasopharyngeal(NP) swabs in vial transport medium  Result Value Ref Range Status   SARS Coronavirus 2 by RT PCR NEGATIVE NEGATIVE Final    Comment: (  NOTE) SARS-CoV-2 target nucleic acids are NOT DETECTED.  The SARS-CoV-2 RNA is generally detectable in upper respiratory specimens during the acute phase of infection. The lowest concentration of SARS-CoV-2 viral copies this assay can detect is 138 copies/mL. A negative result does not preclude SARS-Cov-2 infection and should not be used as the sole basis for treatment or other patient management decisions. A negative result may occur with  improper specimen collection/handling, submission of specimen other than nasopharyngeal swab, presence of viral mutation(s) within the areas targeted by this  assay, and inadequate number of viral copies(<138 copies/mL). A negative result must be combined with clinical observations, patient history, and epidemiological information. The expected result is Negative.  Fact Sheet for Patients:  EntrepreneurPulse.com.au  Fact Sheet for Healthcare Providers:  IncredibleEmployment.be  This test is no t yet approved or cleared by the [place] FDA and  has been authorized for detection and/or diagnosis of SARS-CoV-2 by FDA under an Emergency Use Authorization (EUA). This EUA will remain  in effect (meaning this test can be used) for the duration of the COVID-19 declaration under Section 564(b)(1) of the Act, 21 U.S.C.section 360bbb-3(b)(1), unless the authorization is terminated  or revoked sooner.       Influenza A by PCR NEGATIVE NEGATIVE Final   Influenza B by PCR NEGATIVE NEGATIVE Final    Comment: (NOTE) The Xpert Xpress SARS-CoV-2/FLU/RSV plus assay is intended as an aid in the diagnosis of influenza from Nasopharyngeal swab specimens and should not be used as a sole basis for treatment. Nasal washings and aspirates are unacceptable for Xpert Xpress SARS-CoV-2/FLU/RSV testing.  Fact Sheet for Patients: EntrepreneurPulse.com.au  Fact Sheet for Healthcare Providers: IncredibleEmployment.be  This test is not yet approved or cleared by the [place] FDA and has been authorized for detection and/or diagnosis of SARS-CoV-2 by FDA under an Emergency Use Authorization (EUA). This EUA will remain in effect (meaning this test can be used) for the duration of the COVID-19 declaration under Section 564(b)(1) of the Act, 21 U.S.C. section 360bbb-3(b)(1), unless the authorization is terminated or revoked.  Performed at [facility], [address] [place], [place] [address]   Culture, blood (routine x 2)     Status: Abnormal   Collection Time: 12/29/20 10:38 AM    Specimen: BLOOD RIGHT HAND  Result Value Ref Range Status   Specimen Description   Final    BLOOD RIGHT HAND BOTTLES DRAWN AEROBIC AND ANAEROBIC Performed at [facility], [address], [place], [place] [address]    Special Requests   Final    Blood Culture adequate volume Performed at [place]., [place], [place] [address]    Culture  Setup Time   Final    GRAM POSITIVE COCCI IN BOTH AEROBIC AND ANAEROBIC BOTTLES Gram Stain Report Called to,Read Back By and Verified With: SIMPSON K. AT [facility] AT 2245 ON [id number] BY THOMPSON S. CRITICAL RESULT CALLED TO, READ BACK BY AND VERIFIED WITH: M LILLISTON,[email or web address]@0450  12/30/20 [place] Performed at [facility], [place] [address] [place],  [address]    Culture (A)  Final    STAPHYLOCOCCUS AUREUS STAPHYLOCOCCUS EPIDERMIDIS    Report Status 01/02/2021 FINAL  Final   Organism ID, Bacteria STAPHYLOCOCCUS AUREUS  Final   Organism ID, Bacteria STAPHYLOCOCCUS EPIDERMIDIS  Final      Susceptibility   Staphylococcus aureus - MIC*    CIPROFLOXACIN <=0.5 SENSITIVE Sensitive     ERYTHROMYCIN <=0.25 SENSITIVE Sensitive     GENTAMICIN <=0.5 SENSITIVE  Sensitive     OXACILLIN 0.5 SENSITIVE Sensitive     TETRACYCLINE <=1 SENSITIVE Sensitive     VANCOMYCIN <=0.5 SENSITIVE Sensitive     TRIMETH/SULFA <=10 SENSITIVE Sensitive     CLINDAMYCIN <=0.25 SENSITIVE Sensitive     RIFAMPIN <=0.5 SENSITIVE Sensitive     Inducible Clindamycin NEGATIVE Sensitive     * STAPHYLOCOCCUS AUREUS   Staphylococcus epidermidis - MIC*    CIPROFLOXACIN <=0.5 SENSITIVE Sensitive     ERYTHROMYCIN >=8 RESISTANT Resistant     GENTAMICIN <=0.5 SENSITIVE Sensitive     OXACILLIN >=4 RESISTANT Resistant     TETRACYCLINE >=16 RESISTANT Resistant     VANCOMYCIN 1 SENSITIVE Sensitive     TRIMETH/SULFA <=10 SENSITIVE Sensitive     CLINDAMYCIN RESISTANT Resistant     RIFAMPIN <=0.5 SENSITIVE Sensitive     Inducible Clindamycin POSITIVE Resistant     * STAPHYLOCOCCUS  EPIDERMIDIS  Culture, blood (routine x 2)     Status: Abnormal   Collection Time: 12/29/20 10:38 AM   Specimen: Left Antecubital; Blood  Result Value Ref Range Status   Specimen Description   Final    LEFT ANTECUBITAL BOTTLES DRAWN AEROBIC ONLY Performed at [facility], [address], [place], [place] [address]    Special Requests   Final    Blood Culture adequate volume Performed at [facility], [address], [place], [place] [address]    Culture  Setup Time   Final    GRAM POSITIVE COCCI AEROBIC BOTTLE ONLY CRITICAL VALUE NOTED.  VALUE IS CONSISTENT WITH PREVIOUSLY REPORTED AND CALLED VALUE. Performed at [facility], [place] [address], [place], [place] [address]    Culture STAPHYLOCOCCUS EPIDERMIDIS (A)  Final   Report Status 01/05/2021 FINAL  Final   Organism ID, Bacteria STAPHYLOCOCCUS EPIDERMIDIS  Final      Susceptibility   Staphylococcus epidermidis - MIC*    CIPROFLOXACIN <=0.5 SENSITIVE Sensitive     ERYTHROMYCIN >=8 RESISTANT Resistant     GENTAMICIN <=0.5 SENSITIVE Sensitive     OXACILLIN >=4 RESISTANT Resistant     TETRACYCLINE >=16 RESISTANT Resistant     VANCOMYCIN 1 SENSITIVE Sensitive     TRIMETH/SULFA <=10 SENSITIVE Sensitive     CLINDAMYCIN RESISTANT Resistant     RIFAMPIN <=0.5 SENSITIVE Sensitive     Inducible Clindamycin POSITIVE Resistant     * STAPHYLOCOCCUS EPIDERMIDIS  Blood Culture ID Panel (Reflexed)     Status: Abnormal   Collection Time: 12/29/20 10:38 AM  Result Value Ref Range Status   Enterococcus faecalis NOT DETECTED NOT DETECTED Final   Enterococcus Faecium NOT DETECTED NOT DETECTED Final   Listeria monocytogenes NOT DETECTED NOT DETECTED Final   Staphylococcus species DETECTED (A) NOT DETECTED Final    Comment: CRITICAL RESULT CALLED TO, READ BACK BY AND VERIFIED WITH: M LILLISTON,PHARMD@0450  12/30/20 [place]    Staphylococcus aureus (BCID) DETECTED (A) NOT DETECTED Final    Comment: CRITICAL RESULT CALLED TO, READ BACK BY AND  VERIFIED WITH: M LILLISTON,PHARMD@0450  12/30/20 [place]    Staphylococcus epidermidis DETECTED (A) NOT DETECTED Final    Comment: CRITICAL RESULT CALLED TO, READ BACK BY AND VERIFIED WITH: M LILLISTON,PHARMD@0453  12/30/20 [place]    Staphylococcus lugdunensis NOT DETECTED NOT DETECTED Final   Streptococcus species NOT DETECTED NOT DETECTED Final   Streptococcus agalactiae NOT DETECTED NOT DETECTED Final   Streptococcus pneumoniae NOT DETECTED NOT DETECTED Final   Streptococcus pyogenes NOT DETECTED NOT DETECTED Final   A.calcoaceticus-baumannii NOT DETECTED NOT DETECTED Final  Bacteroides fragilis NOT DETECTED NOT DETECTED Final   Enterobacterales NOT DETECTED NOT DETECTED Final   Enterobacter cloacae complex NOT DETECTED NOT DETECTED Final   Escherichia coli NOT DETECTED NOT DETECTED Final   Klebsiella aerogenes NOT DETECTED NOT DETECTED Final   Klebsiella oxytoca NOT DETECTED NOT DETECTED Final   Klebsiella pneumoniae NOT DETECTED NOT DETECTED Final   Proteus species NOT DETECTED NOT DETECTED Final   Salmonella species NOT DETECTED NOT DETECTED Final   Serratia marcescens NOT DETECTED NOT DETECTED Final   Haemophilus influenzae NOT DETECTED NOT DETECTED Final   Neisseria meningitidis NOT DETECTED NOT DETECTED Final   Pseudomonas aeruginosa NOT DETECTED NOT DETECTED Final   Stenotrophomonas maltophilia NOT DETECTED NOT DETECTED Final   Candida albicans NOT DETECTED NOT DETECTED Final   Candida auris NOT DETECTED NOT DETECTED Final   Candida glabrata NOT DETECTED NOT DETECTED Final   Candida krusei NOT DETECTED NOT DETECTED Final   Candida parapsilosis NOT DETECTED NOT DETECTED Final   Candida tropicalis NOT DETECTED NOT DETECTED Final   Cryptococcus neoformans/gattii NOT DETECTED NOT DETECTED Final   Methicillin resistance mecA/C DETECTED (A) NOT DETECTED Final    Comment: CRITICAL RESULT CALLED TO, READ BACK BY AND VERIFIED WITH: M LILLISTON,PHARMD@0453  12/30/20 [place]    Meth resistant  mecA/C and MREJ NOT DETECTED NOT DETECTED Final    Comment: Performed at [facility], [address], [place], [place] [address]  MRSA Next Gen by PCR, Nasal     Status: None   Collection Time: 12/29/20 11:04 PM   Specimen: Nasal Mucosa; Nasal Swab  Result Value Ref Range Status   MRSA by PCR Next Gen NOT DETECTED NOT DETECTED Final    Comment: (NOTE) The GeneXpert MRSA Assay (FDA approved for NASAL specimens only), is one component of a comprehensive MRSA colonization surveillance program. It is not intended to diagnose MRSA infection nor to guide or monitor treatment for MRSA infections. Test performance is not FDA approved in patients less than 7 years old. Performed at [facility], [place] [address] [place], [place] [address]   Culture, blood (routine x 2)     Status: None   Collection Time: 12/30/20  3:34 PM   Specimen: BLOOD RIGHT HAND  Result Value Ref Range Status   Specimen Description   Final    BLOOD RIGHT HAND Performed at [place] [address] [place], [place] [address]    Special Requests   Final    BOTTLES DRAWN AEROBIC ONLY Blood Culture results may not be optimal due to an inadequate volume of blood received in culture bottles Performed at [place] [address], [place], [place] [address]    Culture   Final    NO GROWTH 5 DAYS Performed at [facility], [place] [address], [place], [place] [address]    Report Status 01/04/2021 FINAL  Final  Culture, blood (routine x 2)     Status: None   Collection Time: 12/30/20  3:34 PM   Specimen: BLOOD  Result Value Ref Range Status   Specimen Description   Final    BLOOD LEFT ANTECUBITAL Performed at [place] [address], [place], [place] [address]    Special Requests   Final    BOTTLES DRAWN AEROBIC ONLY Blood Culture results may not be optimal due to an inadequate volume of blood received in culture  bottles Performed at [place]  [place], [place] [address]    Culture   Final    NO GROWTH 5 DAYS Performed at [facility], [place] [address], [place], [place] [address]    Report Status 01/04/2021 FINAL  Final  Expectorated Sputum Assessment w Gram Stain, Rflx to Resp Cult     Status: None   Collection Time: 12/31/20  8:35 AM   Specimen: Expectorated Sputum  Result Value Ref Range Status   Specimen Description EXPECTORATED SPUTUM  Final   Special Requests NONE  Final   Sputum evaluation   Final    THIS SPECIMEN IS ACCEPTABLE FOR SPUTUM CULTURE Performed at [facility], [place] [address] [place], [place] [address]    Report Status 12/31/2020 FINAL  Final  Culture, Respiratory w Gram Stain     Status: None   Collection Time: 12/31/20  8:35 AM  Result Value Ref Range Status   Specimen Description   Final    EXPECTORATED SPUTUM Performed at [facility], [place] [address] [place], [place] [address]    Special Requests   Final    NONE Reflexed from [id number] Performed at [facility], [place] [address] [place], [place] [address]    Gram Stain   Final    RARE SQUAMOUS EPITHELIAL CELLS PRESENT RARE WBC PRESENT,BOTH PMN AND MONONUCLEAR NO ORGANISMS SEEN Performed at [facility], [place] [address] [place], [place] [address]    Culture RARE STAPHYLOCOCCUS AUREUS  Final   Report Status 01/03/2021 FINAL  Final   Organism ID, Bacteria STAPHYLOCOCCUS AUREUS  Final      Susceptibility   Staphylococcus aureus - MIC*    CIPROFLOXACIN <=0.5 SENSITIVE Sensitive     ERYTHROMYCIN <=0.25 SENSITIVE Sensitive     GENTAMICIN <=0.5 SENSITIVE Sensitive     OXACILLIN 0.5 SENSITIVE Sensitive     TETRACYCLINE <=1 SENSITIVE Sensitive     VANCOMYCIN <=0.5 SENSITIVE Sensitive     TRIMETH/SULFA <=10 SENSITIVE Sensitive     CLINDAMYCIN <=0.25 SENSITIVE Sensitive     RIFAMPIN <=0.5 SENSITIVE Sensitive      Inducible Clindamycin NEGATIVE Sensitive     * RARE STAPHYLOCOCCUS AUREUS  Aerobic/Anaerobic Culture w Gram Stain (surgical/deep wound)     Status: None (Preliminary result)   Collection Time: 01/01/21  1:17 PM   Specimen: PATH Other; Tissue  Result Value Ref Range Status   Specimen Description   Final    WOUND INFECTED INFUSION PORT SITE Performed at [place] [address] [place], [place] [address]    Special Requests   Final    NONE Performed at [facility], [place] [address] [place], [place] [address]    Gram Stain   Final    FEW WBC PRESENT, PREDOMINANTLY MONONUCLEAR NO ORGANISMS SEEN    Culture   Final    NO GROWTH 4 DAYS NO ANAEROBES ISOLATED; CULTURE IN PROGRESS FOR 5 DAYS Performed at [place] [address], [place], [place] [address]    Report Status PENDING  Incomplete  Culture, blood (routine x 2)     Status: None (Preliminary result)   Collection Time: 01/02/21  2:18 AM   Specimen: BLOOD RIGHT HAND  Result Value Ref Range Status   Specimen Description   Final    BLOOD RIGHT HAND Performed at [place] [address], [place], [place] [address]    Special Requests   Final    BOTTLES DRAWN AEROBIC ONLY Blood  Culture adequate volume Performed at [place] [address] [place], [place] [address]    Culture   Final    NO GROWTH 3 DAYS Performed at [facility], [place] [address], [place], [place] [address]    Report Status PENDING  Incomplete  Culture, blood (routine x 2)     Status: None (Preliminary result)   Collection Time: 01/02/21  2:18 AM   Specimen: Left Antecubital; Blood  Result Value Ref Range Status   Specimen Description   Final    LEFT ANTECUBITAL BLOOD Performed at [facility], [place] [address], [place], [place] [address]    Special Requests   Final    BOTTLES DRAWN AEROBIC ONLY Blood Culture results may not be optimal due to an  inadequate volume of blood received in culture bottles Performed at [place] [address], [place], [place] [address]    Culture   Final    NO GROWTH 3 DAYS Performed at [facility], [place] [address], [place],  [address]    Report Status PENDING  Incomplete     Labs: BNP (last 3 results) Recent Labs    12/29/20 1011  BNP 673.4*   Basic Metabolic Panel: Recent Labs  Lab 12/31/20 0530 01/01/21 0346 01/02/21 0218 01/03/21 0335 01/04/21 0336  NA 134* 134* 131* 132* 133*  K 4.0 4.6 4.6 5.1 4.4  CL 105 105 106 104 102  CO2 23 20* 20* 23 22  GLUCOSE 131* 212* 207* 144* 156*  BUN 24* 22 20 24* 26*  CREATININE 0.94 0.87 0.87 0.94 0.91  CALCIUM 7.9* 8.2* 8.0* 8.2* 8.2*   Liver Function Tests: No results for input(s): AST, ALT, ALKPHOS, BILITOT, PROT, ALBUMIN in the last 168 hours. No results for input(s): LIPASE, AMYLASE in the last 168 hours. No results for input(s): AMMONIA in the last 168 hours. CBC: Recent Labs  Lab 01/01/21 0346 01/02/21 0218 01/03/21 0335 01/04/21 0336 01/05/21 0344  WBC 12.4* 16.0* 21.6* 19.1* 17.4*  NEUTROABS 10.2* 12.4* 15.9* 14.5* 15.7*  HGB 7.4* 8.0* 8.4* 8.6* 8.5*  HCT 24.3* 25.6* 26.8* 27.9* 26.9*  MCV 98.0 97.7 97.5 98.9 96.8  PLT 184 177 190 201 205   Cardiac Enzymes: No results for input(s): CKTOTAL, CKMB, CKMBINDEX, TROPONINI in the last 168 hours. BNP: Invalid input(s): POCBNP CBG: Recent Labs  Lab 01/04/21 1135 01/04/21 1654 01/04/21 2118 01/05/21 0804 01/05/21 1211  GLUCAP 77 184* 201* 80 98   D-Dimer No results for input(s): DDIMER in the last 72 hours. Hgb A1c No results for input(s): HGBA1C in the last 72 hours. Lipid Profile No results for input(s): CHOL, HDL, LDLCALC, TRIG, CHOLHDL, LDLDIRECT in the last 72 hours. Thyroid function studies No results for input(s): TSH, T4TOTAL, T3FREE, THYROIDAB in the last 72 hours.  Invalid input(s): FREET3 Anemia work up No results for  input(s): VITAMINB12, FOLATE, FERRITIN, TIBC, IRON, RETICCTPCT in the last 72 hours. Urinalysis    Component Value Date/Time   COLORURINE YELLOW 12/30/2020 1843   APPEARANCEUR CLOUDY (A) 12/30/2020 1843   LABSPEC 1.021 12/30/2020 1843   PHURINE 5.0 12/30/2020 1843   GLUCOSEU NEGATIVE 12/30/2020 1843   HGBUR NEGATIVE 12/30/2020 1843   BILIRUBINUR NEGATIVE 12/30/2020 1843   KETONESUR NEGATIVE 12/30/2020 1843   PROTEINUR NEGATIVE 12/30/2020 1843   NITRITE NEGATIVE 12/30/2020 1843   LEUKOCYTESUR NEGATIVE 12/30/2020 1843   Sepsis Labs Invalid input(s): PROCALCITONIN,  WBC,  LACTICIDVEN Microbiology Recent Results (from the past  240 hour(s))  Resp Panel by RT-PCR (Flu A&B, Covid) Nasopharyngeal Swab     Status: None   Collection Time: 12/29/20 10:12 AM   Specimen: Nasopharyngeal Swab; Nasopharyngeal(NP) swabs in vial transport medium  Result Value Ref Range Status   SARS Coronavirus 2 by RT PCR NEGATIVE NEGATIVE Final    Comment: (NOTE) SARS-CoV-2 target nucleic acids are NOT DETECTED.  The SARS-CoV-2 RNA is generally detectable in upper respiratory specimens during the acute phase of infection. The lowest concentration of SARS-CoV-2 viral copies this assay can detect is 138 copies/mL. A negative result does not preclude SARS-Cov-2 infection and should not be used as the sole basis for treatment or other patient management decisions. A negative result may occur with  improper specimen collection/handling, submission of specimen other than nasopharyngeal swab, presence of viral mutation(s) within the areas targeted by this assay, and inadequate number of viral copies(<138 copies/mL). A negative result must be combined with clinical observations, patient history, and epidemiological information. The expected result is Negative.  Fact Sheet for Patients:  EntrepreneurPulse.com.au  Fact Sheet for Healthcare Providers:   IncredibleEmployment.be  This test is no t yet approved or cleared by the [place] FDA and  has been authorized for detection and/or diagnosis of SARS-CoV-2 by FDA under an Emergency Use Authorization (EUA). This EUA will remain  in effect (meaning this test can be used) for the duration of the COVID-19 declaration under Section 564(b)(1) of the Act, 21 U.S.C.section 360bbb-3(b)(1), unless the authorization is terminated  or revoked sooner.       Influenza A by PCR NEGATIVE NEGATIVE Final   Influenza B by PCR NEGATIVE NEGATIVE Final    Comment: (NOTE) The Xpert Xpress SARS-CoV-2/FLU/RSV plus assay is intended as an aid in the diagnosis of influenza from Nasopharyngeal swab specimens and should not be used as a sole basis for treatment. Nasal washings and aspirates are unacceptable for Xpert Xpress SARS-CoV-2/FLU/RSV testing.  Fact Sheet for Patients: EntrepreneurPulse.com.au  Fact Sheet for Healthcare Providers: IncredibleEmployment.be  This test is not yet approved or cleared by the [place] FDA and has been authorized for detection and/or diagnosis of SARS-CoV-2 by FDA under an Emergency Use Authorization (EUA). This EUA will remain in effect (meaning this test can be used) for the duration of the COVID-19 declaration under Section 564(b)(1) of the Act, 21 U.S.C. section 360bbb-3(b)(1), unless the authorization is terminated or revoked.  Performed at [facility], [address], [place], [place] [address]   Culture, blood (routine x 2)     Status: Abnormal   Collection Time: 12/29/20 10:38 AM   Specimen: BLOOD RIGHT HAND  Result Value Ref Range Status   Specimen Description   Final    BLOOD RIGHT HAND BOTTLES DRAWN AEROBIC AND ANAEROBIC Performed at [facility], [address] [place], [place] [address]    Special Requests   Final    Blood Culture adequate volume Performed at [place]., [place], [place] [address]    Culture  Setup Time   Final    GRAM POSITIVE COCCI IN BOTH AEROBIC AND ANAEROBIC BOTTLES Gram Stain Report Called to,Read Back By and Verified With: SIMPSON K. AT [facility] AT 2245 ON [id number] BY THOMPSON S. CRITICAL RESULT CALLED TO, READ BACK BY AND VERIFIED WITH: M LILLISTON,PHARMD@0450  12/30/20 [place] Performed at [facility], [place] [address] [place], [place] [address]    Culture (A)  Final    STAPHYLOCOCCUS AUREUS STAPHYLOCOCCUS EPIDERMIDIS    Report  Status 01/02/2021 FINAL  Final   Organism ID, Bacteria STAPHYLOCOCCUS AUREUS  Final   Organism ID, Bacteria STAPHYLOCOCCUS EPIDERMIDIS  Final      Susceptibility   Staphylococcus aureus - MIC*    CIPROFLOXACIN <=0.5 SENSITIVE Sensitive     ERYTHROMYCIN <=0.25 SENSITIVE Sensitive     GENTAMICIN <=0.5 SENSITIVE Sensitive     OXACILLIN 0.5 SENSITIVE Sensitive     TETRACYCLINE <=1 SENSITIVE Sensitive     VANCOMYCIN <=0.5 SENSITIVE Sensitive     TRIMETH/SULFA <=10 SENSITIVE Sensitive     CLINDAMYCIN <=0.25 SENSITIVE Sensitive     RIFAMPIN <=0.5 SENSITIVE Sensitive     Inducible Clindamycin NEGATIVE Sensitive     * STAPHYLOCOCCUS AUREUS   Staphylococcus epidermidis - MIC*    CIPROFLOXACIN <=0.5 SENSITIVE Sensitive     ERYTHROMYCIN >=8 RESISTANT Resistant     GENTAMICIN <=0.5 SENSITIVE Sensitive     OXACILLIN >=4 RESISTANT Resistant     TETRACYCLINE >=16 RESISTANT Resistant     VANCOMYCIN 1 SENSITIVE Sensitive     TRIMETH/SULFA <=10 SENSITIVE Sensitive     CLINDAMYCIN RESISTANT Resistant     RIFAMPIN <=0.5 SENSITIVE Sensitive     Inducible Clindamycin POSITIVE Resistant     * STAPHYLOCOCCUS EPIDERMIDIS  Culture, blood (routine x 2)     Status: Abnormal   Collection Time: 12/29/20 10:38 AM   Specimen: Left Antecubital; Blood  Result Value Ref Range Status   Specimen Description   Final    LEFT ANTECUBITAL BOTTLES DRAWN AEROBIC ONLY Performed at [facility], [address] [place], [place]  [address]    Special Requests   Final    Blood Culture adequate volume Performed at [facility], [address] [place], [place] [address]    Culture  Setup Time   Final    GRAM POSITIVE COCCI AEROBIC BOTTLE ONLY CRITICAL VALUE NOTED.  VALUE IS CONSISTENT WITH PREVIOUSLY REPORTED AND CALLED VALUE. Performed at [facility], [place] [address] [place], [place] [address]    Culture STAPHYLOCOCCUS EPIDERMIDIS (A)  Final   Report Status 01/05/2021 FINAL  Final   Organism ID, Bacteria STAPHYLOCOCCUS EPIDERMIDIS  Final      Susceptibility   Staphylococcus epidermidis - MIC*    CIPROFLOXACIN <=0.5 SENSITIVE Sensitive     ERYTHROMYCIN >=8 RESISTANT Resistant     GENTAMICIN <=0.5 SENSITIVE Sensitive     OXACILLIN >=4 RESISTANT Resistant     TETRACYCLINE >=16 RESISTANT Resistant     VANCOMYCIN 1 SENSITIVE Sensitive     TRIMETH/SULFA <=10 SENSITIVE Sensitive     CLINDAMYCIN RESISTANT Resistant     RIFAMPIN <=0.5 SENSITIVE Sensitive     Inducible Clindamycin POSITIVE Resistant     * STAPHYLOCOCCUS EPIDERMIDIS  Blood Culture ID Panel (Reflexed)     Status: Abnormal   Collection Time: 12/29/20 10:38 AM  Result Value Ref Range Status   Enterococcus faecalis NOT DETECTED NOT DETECTED Final   Enterococcus Faecium NOT DETECTED NOT DETECTED Final   Listeria monocytogenes NOT DETECTED NOT DETECTED Final   Staphylococcus species DETECTED (A) NOT DETECTED Final    Comment: CRITICAL RESULT CALLED TO, READ BACK BY AND VERIFIED WITH: M LILLISTON,PHARMD@0450  12/30/20 [place]    Staphylococcus aureus (BCID) DETECTED (A) NOT DETECTED Final    Comment: CRITICAL RESULT CALLED TO, READ BACK BY AND VERIFIED WITH: M LILLISTON,PHARMD@0450  12/30/20 [place]    Staphylococcus epidermidis DETECTED (A) NOT DETECTED Final    Comment: CRITICAL RESULT CALLED TO, READ BACK BY AND VERIFIED WITH: M LILLISTON,PHARMD@0453  12/30/20  [place]    Staphylococcus lugdunensis NOT DETECTED NOT DETECTED Final   Streptococcus species NOT  DETECTED NOT DETECTED Final   Streptococcus agalactiae NOT DETECTED NOT DETECTED Final   Streptococcus pneumoniae NOT DETECTED NOT DETECTED Final   Streptococcus pyogenes NOT DETECTED NOT DETECTED Final   A.calcoaceticus-baumannii NOT DETECTED NOT DETECTED Final   Bacteroides fragilis NOT DETECTED NOT DETECTED Final   Enterobacterales NOT DETECTED NOT DETECTED Final   Enterobacter cloacae complex NOT DETECTED NOT DETECTED Final   Escherichia coli NOT DETECTED NOT DETECTED Final   Klebsiella aerogenes NOT DETECTED NOT DETECTED Final   Klebsiella oxytoca NOT DETECTED NOT DETECTED Final   Klebsiella pneumoniae NOT DETECTED NOT DETECTED Final   Proteus species NOT DETECTED NOT DETECTED Final   Salmonella species NOT DETECTED NOT DETECTED Final   Serratia marcescens NOT DETECTED NOT DETECTED Final   Haemophilus influenzae NOT DETECTED NOT DETECTED Final   Neisseria meningitidis NOT DETECTED NOT DETECTED Final   Pseudomonas aeruginosa NOT DETECTED NOT DETECTED Final   Stenotrophomonas maltophilia NOT DETECTED NOT DETECTED Final   Candida albicans NOT DETECTED NOT DETECTED Final   Candida auris NOT DETECTED NOT DETECTED Final   Candida glabrata NOT DETECTED NOT DETECTED Final   Candida krusei NOT DETECTED NOT DETECTED Final   Candida parapsilosis NOT DETECTED NOT DETECTED Final   Candida tropicalis NOT DETECTED NOT DETECTED Final   Cryptococcus neoformans/gattii NOT DETECTED NOT DETECTED Final   Methicillin resistance mecA/C DETECTED (A) NOT DETECTED Final    Comment: CRITICAL RESULT CALLED TO, READ BACK BY AND VERIFIED WITH: M LILLISTON,PHARMD@0453  12/30/20 [place]    Meth resistant mecA/C and MREJ NOT DETECTED NOT DETECTED Final    Comment: Performed at [facility], [address] [place], [place] [address]  MRSA Next Gen by PCR, Nasal     Status: None   Collection Time: 12/29/20 11:04 PM   Specimen: Nasal Mucosa; Nasal Swab  Result Value Ref Range Status   MRSA by PCR Next Gen NOT  DETECTED NOT DETECTED Final    Comment: (NOTE) The GeneXpert MRSA Assay (FDA approved for NASAL specimens only), is one component of a comprehensive MRSA colonization surveillance program. It is not intended to diagnose MRSA infection nor to guide or monitor treatment for MRSA infections. Test performance is not FDA approved in patients less than 10 years old. Performed at [facility], [place] [address] [place], [place] [address]   Culture, blood (routine x 2)     Status: None   Collection Time: 12/30/20  3:34 PM   Specimen: BLOOD RIGHT HAND  Result Value Ref Range Status   Specimen Description   Final    BLOOD RIGHT HAND Performed at [place] [address] [place], [place] [address]    Special Requests   Final    BOTTLES DRAWN AEROBIC ONLY Blood Culture results may not be optimal due to an inadequate volume of blood received in culture bottles Performed at [place] [address] [place], [place] [address]    Culture   Final    NO GROWTH 5 DAYS Performed at [facility], [place] [address] [place], [place] [address]    Report Status 01/04/2021 FINAL  Final  Culture, blood (routine x 2)     Status: None   Collection Time: 12/30/20  3:34 PM   Specimen: BLOOD  Result Value Ref Range Status   Specimen Description   Final    BLOOD LEFT  ANTECUBITAL Performed at [facility], [place] [address] [place], [place] [address]    Special Requests   Final    BOTTLES DRAWN AEROBIC ONLY Blood Culture results may not be optimal due to an inadequate volume of blood received in culture bottles Performed at [place] [address], [place], [place] [address]    Culture   Final    NO GROWTH 5 DAYS Performed at [facility], [place] [address] [place], [place] [address]    Report Status 01/04/2021 FINAL  Final  Expectorated Sputum Assessment w Gram Stain, Rflx to Resp Cult      Status: None   Collection Time: 12/31/20  8:35 AM   Specimen: Expectorated Sputum  Result Value Ref Range Status   Specimen Description EXPECTORATED SPUTUM  Final   Special Requests NONE  Final   Sputum evaluation   Final    THIS SPECIMEN IS ACCEPTABLE FOR SPUTUM CULTURE Performed at [facility], [place] [address] [place], [place] [address]    Report Status 12/31/2020 FINAL  Final  Culture, Respiratory w Gram Stain     Status: None   Collection Time: 12/31/20  8:35 AM  Result Value Ref Range Status   Specimen Description   Final    EXPECTORATED SPUTUM Performed at [facility], [place] [address] [place], [place] [address]    Special Requests   Final    NONE Reflexed from [id number] Performed at [facility], [place] [address], [place], [place] [address]    Gram Stain   Final    RARE SQUAMOUS EPITHELIAL CELLS PRESENT RARE WBC PRESENT,BOTH PMN AND MONONUCLEAR NO ORGANISMS SEEN Performed at [facility], [place] [address] [place], [place] [address]    Culture RARE STAPHYLOCOCCUS AUREUS  Final   Report Status 01/03/2021 FINAL  Final   Organism ID, Bacteria STAPHYLOCOCCUS AUREUS  Final      Susceptibility   Staphylococcus aureus - MIC*    CIPROFLOXACIN <=0.5 SENSITIVE Sensitive     ERYTHROMYCIN <=0.25 SENSITIVE Sensitive     GENTAMICIN <=0.5 SENSITIVE Sensitive     OXACILLIN 0.5 SENSITIVE Sensitive     TETRACYCLINE <=1 SENSITIVE Sensitive     VANCOMYCIN <=0.5 SENSITIVE Sensitive     TRIMETH/SULFA <=10 SENSITIVE Sensitive     CLINDAMYCIN <=0.25 SENSITIVE Sensitive     RIFAMPIN <=0.5 SENSITIVE Sensitive     Inducible Clindamycin NEGATIVE Sensitive     * RARE STAPHYLOCOCCUS AUREUS  Aerobic/Anaerobic Culture w Gram Stain (surgical/deep wound)     Status: None (Preliminary result)   Collection Time: 01/01/21  1:17 PM   Specimen: PATH Other; Tissue  Result Value Ref Range Status   Specimen Description   Final    WOUND INFECTED  INFUSION PORT SITE Performed at [place] [address] [place], [place] [address]    Special Requests   Final    NONE Performed at [facility], [place] [address], [place], [place] [address]    Gram Stain   Final    FEW WBC PRESENT, PREDOMINANTLY MONONUCLEAR NO ORGANISMS SEEN    Culture   Final    NO GROWTH 4 DAYS NO ANAEROBES ISOLATED; CULTURE IN PROGRESS FOR 5 DAYS Performed at [place] [address] [place], [place] [address]    Report Status PENDING  Incomplete  Culture, blood (routine x 2)     Status: None (Preliminary result)   Collection Time:  01/02/21  2:18 AM   Specimen: BLOOD RIGHT HAND  Result Value Ref Range Status   Specimen Description   Final    BLOOD RIGHT HAND Performed at [facility] [place] [address], [place], [place] [address]    Special Requests   Final    BOTTLES DRAWN AEROBIC ONLY Blood Culture adequate volume Performed at [place] [address], [place], [place] [address]    Culture   Final    NO GROWTH 3 DAYS Performed at [facility], [place] [address], [place], [place] [address]    Report Status PENDING  Incomplete  Culture, blood (routine x 2)     Status: None (Preliminary result)   Collection Time: 01/02/21  2:18 AM   Specimen: Left Antecubital; Blood  Result Value Ref Range Status   Specimen Description   Final    LEFT ANTECUBITAL BLOOD Performed at [facility], [place] [address], [place], [place] [address]    Special Requests   Final    BOTTLES DRAWN AEROBIC ONLY Blood Culture results may not be optimal due to an inadequate volume of blood received in culture bottles Performed at [place] [address] [place], [place] [address]    Culture   Final    NO GROWTH 3 DAYS Performed at [facility], [place] [address] [place],  [address]    Report Status PENDING  Incomplete     Time coordinating  discharge: 38 minutes.   SIGNED:   Hosie Poisson, MD  [facility]  If 7PM-7AM, please contact night-coverage [email or web address] Password [email or web address]

## 2021-01-05 NOTE — Progress Notes (Signed)
Palliative Care Brief Note  Consult received and chart reviewed.   Discussed with Dr. Karleen Hampshire.  Goals clear at this point to complete course of antibiotics for bacteremia and follow up with outpatient oncologist (Dr. Delton Coombes).  Will hold on formal consult.  Please call or reconsult if we can be of further assistance in the care of Mr. Chestnut.  Micheline Rough, MD [place] Team [phone]'  NO CHARGE NOTE

## 2021-01-05 NOTE — Progress Notes (Signed)
Nsg Discharge Note  Admit Date:  12/29/2020 Discharge date: 01/05/2021   [name] to be D/C'd home per MD order.  AVS completed. Patient/caregiver able to verbalize understanding.  Discharge Medication: Allergies as of 01/05/2021   No Known Allergies      Medication List     STOP taking these medications    HYDROcodone bit-homatropine 5-1.5 MG/5ML syrup Commonly known as: Hycodan   Melatonin 10 MG Tabs   methylphenidate 5 MG tablet Commonly known as: Ritalin       TAKE these medications    acetaminophen 500 MG tablet Commonly known as: TYLENOL Take 500 mg by mouth every 6 (six) hours as needed for moderate pain or headache.   albuterol 108 (90 Base) MCG/ACT inhaler Commonly known as: VENTOLIN HFA Inhale 1-2 puffs into the lungs every 6 (six) hours as needed for wheezing or shortness of breath.   ALPRAZolam 0.25 MG tablet Commonly known as: XANAX TAKE 1 TABLET BY MOUTH THREE TIMES DAILY AS NEEDED FOR ANXIETY   ceFAZolin  IVPB Commonly known as: ANCEF Inject 2 g into the vein every 8 (eight) hours for 9 days. Indication:  MSSA bacteremia First Dose: Yes Last Day of Therapy:  01/15/21  Labs - Once weekly:  CBC/D and BMP, Labs - Every other week:  ESR and CRP Method of administration: IV Push Method of administration may be changed at the discretion of home infusion pharmacist based upon assessment of the patient and/or caregiver's ability to self-administer the medication ordered. Start taking on: January 06, 2021   dextromethorphan 30 MG/5ML liquid Commonly known as: DELSYM Take 30 mg by mouth as needed for cough.   Eliquis 5 MG Tabs tablet Generic drug: apixaban Take 1 tablet by mouth twice daily   feeding supplement Liqd Take 237 mLs by mouth 3 (three) times daily between meals.   levothyroxine 100 MCG tablet Commonly known as: Synthroid Take 1 tablet (100 mcg total) by mouth daily before breakfast.   lidocaine 2 % solution Commonly known  as: XYLOCAINE TAKE 15 ML BY MOUTH 4 TIMES DAILY WITH MEALS   lidocaine-prilocaine cream Commonly known as: EMLA APPLY SMALL AMOUNT TO PORTA-CATH SITE AND COVER WITH PLASTIC WRAP 1 HOUR PRIOR TO CHEMO APPOINTMENTS.   linezolid 600 MG tablet Commonly known as: ZYVOX Take 1 tablet (600 mg total) by mouth 2 (two) times daily for 14 days. Start after completion of IV antibiotics- Start date 01/16/21 Start taking on: January 16, 2021   megestrol 400 MG/10ML suspension Commonly known as: MEGACE Take 10 mLs (400 mg total) by mouth 2 (two) times daily.   metoprolol tartrate 25 MG tablet Commonly known as: LOPRESSOR Take 1 tablet (25 mg total) by mouth 2 (two) times daily.   multivitamin with minerals Tabs tablet Take 1 tablet by mouth daily. Start taking on: January 06, 2021   pantoprazole 40 MG tablet Commonly known as: PROTONIX Take 1 tablet (40 mg total) by mouth daily. Start taking on: January 06, 2021   predniSONE 10 MG tablet Commonly known as: DELTASONE Take 4 tablets (40 mg total) by mouth daily for 7 days.               Discharge Care Instructions  (From admission, onward)           Start     Ordered   01/04/21 0000  Change dressing on IV access line weekly and PRN  (Home infusion instructions - [facility] )  01/04/21 1624            Discharge Assessment: Vitals:   01/05/21 0529 01/05/21 1400  BP: (!) 122/52 (!) 129/56  Pulse: (!) 54 62  Resp: 16 20  Temp: (!) 97.4 F (36.3 C) 98.1 F (36.7 C)  SpO2: 99% 100%   Skin clean, dry and intact without evidence of skin break down, no evidence of skin tears noted. IV catheter discontinued intact. Site without signs and symptoms of complications - no redness or edema noted at insertion site, patient denies c/o pain - only slight tenderness at site.  Dressing with slight pressure applied.  D/c Instructions-Education: Discharge instructions given to patient/family with verbalized  understanding. D/c education completed with patient/family including follow up instructions, medication list, d/c activities limitations if indicated, with other d/c instructions as indicated by MD - patient able to verbalize understanding, all questions fully answered. Patient instructed to return to ED, call 911, or call MD for any changes in condition.  Patient escorted via [place], and D/C home via private auto.  Atilano Ina, RN 01/05/2021 3:32 PM

## 2021-01-07 ENCOUNTER — Encounter (HOSPITAL_COMMUNITY): Payer: Self-pay | Admitting: Cardiology

## 2021-01-07 ENCOUNTER — Ambulatory Visit (HOSPITAL_COMMUNITY): Payer: Medicare Other

## 2021-01-07 LAB — CULTURE, BLOOD (ROUTINE X 2)
Culture: NO GROWTH
Culture: NO GROWTH
Special Requests: ADEQUATE

## 2021-01-07 LAB — AEROBIC/ANAEROBIC CULTURE W GRAM STAIN (SURGICAL/DEEP WOUND): Culture: NO GROWTH

## 2021-01-09 ENCOUNTER — Ambulatory Visit (HOSPITAL_COMMUNITY): Payer: Medicare Other | Admitting: Physical Therapy

## 2021-01-13 ENCOUNTER — Other Ambulatory Visit (HOSPITAL_COMMUNITY): Payer: Self-pay | Admitting: Hematology

## 2021-01-14 ENCOUNTER — Encounter (HOSPITAL_COMMUNITY): Payer: Self-pay | Admitting: Hematology

## 2021-01-15 ENCOUNTER — Ambulatory Visit (HOSPITAL_COMMUNITY): Payer: Medicare Other | Admitting: Physical Therapy

## 2021-01-15 ENCOUNTER — Telehealth: Payer: Self-pay

## 2021-01-15 ENCOUNTER — Telehealth: Payer: Self-pay | Admitting: Pharmacist

## 2021-01-15 NOTE — Telephone Encounter (Signed)
Spoke to [facility] and gave verbal order to pull patient's PICC line today as he is transitioning to oral antibiotics. They verbalized understanding.  Muhsin Doris L. Carrson Lightcap, PharmD [facility] Clinical Pharmacist Practitioner

## 2021-01-15 NOTE — Telephone Encounter (Signed)
Received call from patent's wife explaining that she recived additional IV therapy delivery and she wanted to confirm that her husband's IV therapy was suppose to complete today and then transition to oral therapy tomorrow.  Confirmed with wife that she was correct and to contact pharmacy if they can take the medication back.  Confirm OPAT orders OPAT Orders Discharge antibiotics to be given via PICC line Discharge antibiotics: cefazolin iv until 11/29, then linezolid 600 mg po bid until 12/13   [facility] Care Per Protocol:   Home health RN for IV administration and teaching; PICC line care and labs.     Labs weekly while on IV antibiotics: _x_ CBC with differential __ BMP _x_ CMP _x_ CRP __ ESR __ Vancomycin trough __ CK   _x_ Please pull PIC at completion of IV antibiotics __ Please leave PIC in place until doctor has seen patient or been notified   Fax weekly labs to [phone]   Clinic Follow Up Appt: 12/06 @ 0845with dr Gale Journey

## 2021-01-15 NOTE — Progress Notes (Signed)
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  PRIOR THERAPY: Concurrent chemoradiation with weekly carboplatin and paclitaxel x 6 cycles from 11/28/2019 to 01/02/2020  NGS Results: not done  CURRENT THERAPY: Taxotere and ramucirumab every 3 weeks  BRIEF ONCOLOGIC HISTORY:  Oncology History  Squamous cell lung cancer, left ([place])  11/08/2019 Initial Diagnosis   Squamous cell lung cancer, left ([place])   11/08/2019 Cancer Staging   Staging form: Lung, AJCC 8th Edition - Clinical stage from 11/08/2019: Stage IIIB (cT4, cN2, cM0) - Signed by Derek Jack, MD on 11/08/2019    11/28/2019 - 01/02/2020 Chemotherapy   The patient had palonosetron (ALOXI) injection 0.25 mg, 0.25 mg, Intravenous,  Once, 6 of 6 cycles Administration: 0.25 mg (11/28/2019), 0.25 mg (12/05/2019), 0.25 mg (12/12/2019), 0.25 mg (12/19/2019), 0.25 mg (12/26/2019), 0.25 mg (01/02/2020) CARBOplatin (PARAPLATIN) 180 mg in sodium chloride 0.9 % 250 mL chemo infusion, 180 mg (100 % of original dose 176 mg), Intravenous,  Once, 6 of 6 cycles Dose modification:   (original dose 176 mg, Cycle 1),   (original dose 201.2 mg, Cycle 2),   (original dose 179.2 mg, Cycle 3),   (original dose 179.2 mg, Cycle 4),   (original dose 195.4 mg, Cycle 5),   (original dose 178.2 mg, Cycle 6) Administration: 180 mg (11/28/2019), 200 mg (12/05/2019), 180 mg (12/12/2019), 180 mg (12/19/2019), 190 mg (12/26/2019), 180 mg (01/02/2020) PACLitaxel (TAXOL) 84 mg in sodium chloride 0.9 % 250 mL chemo infusion (</= 80mg /m2), 45 mg/m2 = 84 mg, Intravenous,  Once, 6 of 6 cycles Administration: 84 mg (11/28/2019), 84 mg (12/05/2019), 84 mg (12/12/2019), 84 mg (12/19/2019), 84 mg (12/26/2019), 84 mg (01/02/2020)   for chemotherapy  treatment.     01/30/2020 - 09/11/2020 Chemotherapy          10/03/2020 -  Chemotherapy   Patient is on Treatment Plan : LUNG Docetaxel + Ramucirumab q21d        CANCER STAGING:  Cancer Staging  Squamous cell lung cancer, left ([place]) Staging form: Lung, AJCC 8th Edition - Clinical stage from 11/08/2019: Stage IIIB (cT4, cN2, cM0) - Signed by Derek Jack, MD on 11/08/2019   INTERVAL HISTORY:  Mr. NICKLAS MCSWEENEY, a 70 y.o. male, returns for routine follow-up and consideration for next cycle of chemotherapy. Yakov was last seen on 12/19/2020.  Due for cycle #3 of Taxotere and ramucirumab today.   Overall, he tells me he has been feeling pretty well. He continues to take Megace for his appetite, and his appetite is good. He walks with the assistance of a walker outside of his home, and he walks unassisted within his home. He denies increased SOB.   Overall, he will not receive his next cycle of chemo today.   REVIEW OF SYSTEMS:  Review of Systems  Constitutional:  Positive for fatigue (40%). Negative for appetite change (75%).  Respiratory:  Negative for shortness of breath.   All other systems reviewed and are negative.  PAST MEDICAL/SURGICAL HISTORY:  Past Medical History:  Diagnosis Date   Anxiety    Arthritis    Dysrhythmia    Hyperlipemia    Hypertension    Hyperthyroidism    Lung cancer ([place])    Myocardial infarction ([place])    Paroxysmal atrial fibrillation ([place] [facility])  Port-A-Cath in place 11/14/2019   Tachycardia    Past Surgical History:  Procedure Laterality Date   APPLICATION OF CRANIAL NAVIGATION N/A 10/26/2020   Procedure: APPLICATION OF CRANIAL NAVIGATION;  Surgeon: Consuella Lose, MD;  Location: [facility];  Service: Neurosurgery;  Laterality: N/A;  RM 21   CARDIAC CATHETERIZATION     CRANIOTOMY Right 10/26/2020   Procedure: Stereotactic Right frontal craniotomy for resection of tumor with sonopet;  Surgeon: Consuella Lose, MD;  Location: [facility];   Service: Neurosurgery;  Laterality: Right;   PORT-A-CATH REMOVAL N/A 01/01/2021   Procedure: REMOVAL PORT-A-CATH;  Surgeon: Armandina Gemma, MD;  Location: [facility];  Service: General;  Laterality: N/A;   PORTACATH PLACEMENT Right 11/01/2019   Procedure: INSERTION PORT-A-CATH;  Surgeon: Aviva Signs, MD;  Location: [facility];  Service: General;  Laterality: Right;   TEE WITHOUT CARDIOVERSION N/A 01/04/2021   Procedure: TRANSESOPHAGEAL ECHOCARDIOGRAM (TEE);  Surgeon: Lelon Perla, MD;  Location: [facility];  Service: Cardiovascular;  Laterality: N/A;    SOCIAL HISTORY:  Social History   Socioeconomic History   Marital status: Married    Spouse name: Not on file   Number of children: 1   Years of education: Not on file   Highest education level: Not on file  Occupational History   Occupation: retired   Occupation: Glass blower/designer: [employer]  Tobacco Use   Smoking status: Former    Years: 45.00    Types: Cigars, Cigarettes    Quit date: 10/19/2014    Years since quitting: 6.2   Smokeless tobacco: Never  Vaping Use   Vaping Use: Never used  Substance and Sexual Activity   Alcohol use: Not Currently   Drug use: No   Sexual activity: Not on file  Other Topics Concern   Not on file  Social History Narrative   Not on file   Social Determinants of Health   Financial Resource Strain: Not on file  Food Insecurity: Not on file  Transportation Needs: Not on file  Physical Activity: Not on file  Stress: Not on file  Social Connections: Not on file  Intimate Partner Violence: Not on file    FAMILY HISTORY:  Family History  Problem Relation Age of Onset   Hypertension Father    Dementia Father        died age 35   Heart disease Father    Heart failure Other     CURRENT MEDICATIONS:  Current Outpatient Medications  Medication Sig Dispense Refill   apixaban (ELIQUIS) 5 MG TABS tablet Take 1 tablet by mouth twice daily 60 tablet 6   dextromethorphan (DELSYM) 30 MG/5ML  liquid Take 30 mg by mouth as needed for cough.     feeding supplement (ENSURE ENLIVE / ENSURE PLUS) LIQD Take 237 mLs by mouth 3 (three) times daily between meals. 237 mL 12   levothyroxine (SYNTHROID) 100 MCG tablet TAKE 1 TABLET BY MOUTH ONCE DAILY BEFORE BREAKFAST 30 tablet 0   lidocaine (XYLOCAINE) 2 % solution TAKE 15 ML BY MOUTH 4 TIMES DAILY WITH MEALS 480 mL 0   linezolid (ZYVOX) 600 MG tablet Take 1 tablet (600 mg total) by mouth 2 (two) times daily for 14 days. Start after completion of IV antibiotics- Start date 01/16/21 28 tablet 0   megestrol (MEGACE) 400 MG/10ML suspension Take 10 mLs (400 mg total) by mouth 2 (two) times daily. 480 mL 1   metoprolol tartrate (LOPRESSOR) 25 MG tablet Take 1 tablet (25 mg  total) by mouth 2 (two) times daily. 180 tablet 3   Multiple Vitamin (MULTIVITAMIN WITH MINERALS) TABS tablet Take 1 tablet by mouth daily.     pantoprazole (PROTONIX) 40 MG tablet Take 1 tablet (40 mg total) by mouth daily. 30 tablet 1   acetaminophen (TYLENOL) 500 MG tablet Take 500 mg by mouth every 6 (six) hours as needed for moderate pain or headache. (Patient not taking: Reported on 01/16/2021)     albuterol (VENTOLIN HFA) 108 (90 Base) MCG/ACT inhaler Inhale 1-2 puffs into the lungs every 6 (six) hours as needed for wheezing or shortness of breath. (Patient not taking: Reported on 01/16/2021)     ALPRAZolam (XANAX) 0.25 MG tablet TAKE 1 TABLET BY MOUTH THREE TIMES DAILY AS NEEDED FOR ANXIETY (Patient not taking: Reported on 01/16/2021) 30 tablet 0   No current facility-administered medications for this visit.    ALLERGIES:  No Known Allergies  PHYSICAL EXAM:  Performance status (ECOG): 1 - Symptomatic but completely ambulatory  Vitals:   01/16/21 0926  BP: (!) 133/59  Pulse: (!) 57  Resp: 19  Temp: (!) 96.7 F (35.9 C)  SpO2: 100%   Wt Readings from Last 3 Encounters:  01/16/21 156 lb 4.8 oz (70.9 kg)  01/05/21 154 lb 12.2 oz (70.2 kg)  12/19/20 145 lb 3.2 oz  (65.9 kg)   Physical Exam Vitals reviewed.  Constitutional:      Appearance: Normal appearance.  Cardiovascular:     Rate and Rhythm: Normal rate and regular rhythm.     Pulses: Normal pulses.     Heart sounds: Normal heart sounds.  Pulmonary:     Effort: Pulmonary effort is normal.     Breath sounds: Normal breath sounds.  Neurological:     General: No focal deficit present.     Mental Status: He is alert and oriented to person, place, and time.  Psychiatric:        Mood and Affect: Mood normal.        Behavior: Behavior normal.    LABORATORY DATA:  I have reviewed the labs as listed.  CBC Latest Ref Rng & Units 01/16/2021 01/05/2021 01/04/2021  WBC 4.0 - 10.5 K/uL 10.8(H) 17.4(H) 19.1(H)  Hemoglobin 13.0 - 17.0 g/dL 9.7(L) 8.5(L) 8.6(L)  Hematocrit 39.0 - 52.0 % 30.5(L) 26.9(L) 27.9(L)  Platelets 150 - 400 K/uL 129(L) 205 201   CMP Latest Ref Rng & Units 01/16/2021 01/04/2021 01/03/2021  Glucose 70 - 99 mg/dL 85 156(H) 144(H)  BUN 8 - 23 mg/dL 19 26(H) 24(H)  Creatinine 0.61 - 1.24 mg/dL 0.67 0.91 0.94  Sodium 135 - 145 mmol/L 133(L) 133(L) 132(L)  Potassium 3.5 - 5.1 mmol/L 4.1 4.4 5.1  Chloride 98 - 111 mmol/L 104 102 104  CO2 22 - 32 mmol/L 24 22 23   Calcium 8.9 - 10.3 mg/dL 8.0(L) 8.2(L) 8.2(L)  Total Protein 6.5 - 8.1 g/dL 5.5(L) - -  Total Bilirubin 0.3 - 1.2 mg/dL 0.6 - -  Alkaline Phos 38 - 126 U/L 38 - -  AST 15 - 41 U/L 20 - -  ALT 0 - 44 U/L 6 - -    DIAGNOSTIC IMAGING:  I have independently reviewed the scans and discussed with the patient. DG Chest Port 1 View  Result Date: 12/29/2020 CLINICAL DATA:  Shortness of breath EXAM: PORTABLE CHEST 1 VIEW COMPARISON:  Previous studies including the examination of radiographs done on 11/01/2019 and CT done on 09/05/2020 FINDINGS: Transverse diameter of heart is increased. There  is homogeneous opacification in the left apex. There are patchy infiltrates in the left mid and left lower lung fields. There is  blunting of left lateral CP angle. Right lung is clear. There is no pneumothorax. IMPRESSION: There is homogeneous opacity in the left apex. There are patchy infiltrates in the left mid and left lower lung fields and prominence of left hilum. Findings suggest pneumonia with possible underlying neoplastic process. Small left pleural effusion is seen. Electronically Signed   By: Elmer Picker M.D.   On: 12/29/2020 11:10   ECHOCARDIOGRAM COMPLETE  Result Date: 12/30/2020    ECHOCARDIOGRAM REPORT   Patient Name:   TONG PIECZYNSKI Date of Exam: 12/30/2020 Medical Rec #:  [id number]      Height:       64.0 in Accession #:    [id number]     Weight:       159.6 lb Date of Birth:  10/05/1950      BSA:          1.778 m Patient Age:    16 years       BP:           106/57 mmHg Patient Gender: M              HR:           71 bpm. Exam Location:  Inpatient Procedure: 2D Echo, Cardiac Doppler and Color Doppler Indications:    Bacteremia  History:        Patient has prior history of Echocardiogram examinations, most                 recent 10/22/2019. Arrythmias:Atrial Fibrillation,                 Signs/Symptoms:Hypotension; Risk Factors:Hyperlipidemia.  Sonographer:    Glo Herring Referring Phys: [id number] [place]  1. Left ventricular ejection fraction, by estimation, is 60 to 65%. The left ventricle has normal function. The left ventricle has no regional wall motion abnormalities. Left ventricular diastolic parameters were normal.  2. Right ventricular systolic function is normal. The right ventricular size is normal.  3. A small pericardial effusion is present. The pericardial effusion is posterior to the left ventricle. There is no evidence of cardiac tamponade.  4. The mitral valve is normal in structure. Mild mitral valve regurgitation. No evidence of mitral stenosis.  5. The aortic valve is normal in structure. Aortic valve regurgitation is not visualized. No aortic stenosis is present.  6. The  inferior vena cava is normal in size with greater than 50% respiratory variability, suggesting right atrial pressure of 3 mmHg. Comparison(s): Prior images reviewed side by side. Small pericardial effusion seen on prior 2021. Conclusion(s)/Recommendation(s): No evidence of valvular vegetations on this transthoracic echocardiogram. Consider a transesophageal echocardiogram to exclude infective endocarditis if clinically indicated. FINDINGS  Left Ventricle: Left ventricular ejection fraction, by estimation, is 60 to 65%. The left ventricle has normal function. The left ventricle has no regional wall motion abnormalities. The left ventricular internal cavity size was normal in size. There is  no left ventricular hypertrophy. Left ventricular diastolic parameters were normal. Right Ventricle: The right ventricular size is normal. No increase in right ventricular wall thickness. Right ventricular systolic function is normal. Left Atrium: Left atrial size was normal in size. Right Atrium: Right atrial size was normal in size. Pericardium: A small pericardial effusion is present. The pericardial effusion is posterior to the left ventricle. There is no evidence of  cardiac tamponade. Mitral Valve: The mitral valve is normal in structure. Mild mitral valve regurgitation. No evidence of mitral valve stenosis. Tricuspid Valve: The tricuspid valve is normal in structure. Tricuspid valve regurgitation is mild . No evidence of tricuspid stenosis. Aortic Valve: The aortic valve is normal in structure. Aortic valve regurgitation is not visualized. No aortic stenosis is present. Aortic valve mean gradient measures 2.0 mmHg. Aortic valve peak gradient measures 3.8 mmHg. Aortic valve area, by VTI measures 1.95 cm. Pulmonic Valve: The pulmonic valve was normal in structure. Pulmonic valve regurgitation is trivial. No evidence of pulmonic stenosis. Aorta: The aortic root is normal in size and structure. Venous: The inferior vena cava is  normal in size with greater than 50% respiratory variability, suggesting right atrial pressure of 3 mmHg. IAS/Shunts: No atrial level shunt detected by color flow Doppler.  LEFT VENTRICLE PLAX 2D LVIDd:         4.20 cm   Diastology LVIDs:         3.00 cm   LV e' medial:    6.53 cm/s LV PW:         1.00 cm   LV E/e' medial:  16.2 LV IVS:        1.00 cm   LV e' lateral:   8.10 cm/s LVOT diam:     2.00 cm   LV E/e' lateral: 13.1 LV SV:         42 LV SV Index:   24 LVOT Area:     3.14 cm  RIGHT VENTRICLE            IVC RV Basal diam:  3.60 cm    IVC diam: 1.90 cm RV S prime:     9.53 cm/s LEFT ATRIUM             Index        RIGHT ATRIUM           Index LA diam:        3.80 cm 2.14 cm/m   RA Area:     17.10 cm LA Vol (A2C):   55.2 ml 31.05 ml/m  RA Volume:   41.40 ml  23.29 ml/m LA Vol (A4C):   60.2 ml 33.87 ml/m LA Biplane Vol: 60.8 ml 34.20 ml/m  AORTIC VALVE                    PULMONIC VALVE AV Area (Vmax):    2.12 cm     PV Vmax:       0.63 m/s AV Area (Vmean):   2.13 cm     PV Peak grad:  1.6 mmHg AV Area (VTI):     1.95 cm AV Vmax:           97.90 cm/s AV Vmean:          67.400 cm/s AV VTI:            0.217 m AV Peak Grad:      3.8 mmHg AV Mean Grad:      2.0 mmHg LVOT Vmax:         66.10 cm/s LVOT Vmean:        45.700 cm/s LVOT VTI:          0.135 m LVOT/AV VTI ratio: 0.62  AORTA Ao Root diam: 3.30 cm Ao Asc diam:  2.60 cm MITRAL VALVE MV Area (PHT): 4.57 cm     SHUNTS MV Decel Time: 166 msec  Systemic VTI:  0.14 m MV E velocity: 106.00 cm/s  Systemic Diam: 2.00 cm MV A velocity: 42.40 cm/s MV E/A ratio:  2.50 Candee Furbish MD Electronically signed by Candee Furbish MD Signature Date/Time: 12/30/2020/1:02:27 PM    Final    ECHO TEE  Result Date: 01/04/2021    TRANSESOPHOGEAL ECHO REPORT   Patient Name:   JACQUIS PAXTON Date of Exam: 01/04/2021 Medical Rec #:  [id number]      Height:       65.0 in Accession #:    [id number]     Weight:       155.6 lb Date of Birth:  April 23, 1950      BSA:          1.778  m Patient Age:    74 years       BP:           111/50 mmHg Patient Gender: M              HR:           68 bpm. Exam Location:  Inpatient Procedure: Transesophageal Echo, Color Doppler and Cardiac Doppler Indications:     Bacteremia  History:         Patient has prior history of Echocardiogram examinations, most                  recent 12/30/2020. Staphylococcus aureus bacteremia,                  Hypotension, Pneumonia/ Chemotherapy pneumonitis, Lung cancer.                  History of Afib.  Sonographer:     Darlina Sicilian RDCS Referring Phys:  [id number] RHONDA G BARRETT Diagnosing Phys: Kirk Ruths MD PROCEDURE: After discussion of the risks and benefits of a TEE, an informed consent was obtained from the patient. TEE procedure time was 6 minutes. The transesophogeal probe was passed without difficulty through the esophogus of the patient. Imaged were  obtained with the patient in a left lateral decubitus position. Local oropharyngeal anesthetic was provided with Cetacaine. Sedation performed by different physician. The patient was monitored while under deep sedation. Anesthestetic sedation was provided intravenously by Anesthesiology: 116.48mg  of Propofol. Image quality was good. The patient's vital signs; including heart rate, blood pressure, and oxygen saturation; remained stable throughout the procedure. The patient developed no complications during the procedure. IMPRESSIONS  1. No vegetations.  2. Left ventricular ejection fraction, by estimation, is 55 to 60%. The left ventricle has normal function.  3. Right ventricular systolic function is normal. The right ventricular size is normal.  4. Left atrial size was moderately dilated. No left atrial/left atrial appendage thrombus was detected.  5. A small pericardial effusion is present.  6. The mitral valve is normal in structure. Mild mitral valve regurgitation.  7. The aortic valve is normal in structure. Aortic valve regurgitation is not visualized.  8.  There is mild (Grade II) plaque involving the descending aorta. FINDINGS  Left Ventricle: Left ventricular ejection fraction, by estimation, is 55 to 60%. The left ventricle has normal function. The left ventricular internal cavity size was normal in size. Right Ventricle: The right ventricular size is normal. Right vetricular wall thickness was not assessed. Right ventricular systolic function is normal. Left Atrium: Left atrial size was moderately dilated. No left atrial/left atrial appendage thrombus was detected. Right Atrium: Right atrial size was normal in size. Pericardium: A small pericardial  effusion is present. Mitral Valve: The mitral valve is normal in structure. Mild mitral valve regurgitation. Tricuspid Valve: The tricuspid valve is normal in structure. Tricuspid valve regurgitation is mild. Aortic Valve: The aortic valve is normal in structure. Aortic valve regurgitation is not visualized. Pulmonic Valve: The pulmonic valve was normal in structure. Pulmonic valve regurgitation is trivial. Aorta: The aortic root is normal in size and structure. There is mild (Grade II) plaque involving the descending aorta. IAS/Shunts: No atrial level shunt detected by color flow Doppler. Additional Comments: No vegetations.   AORTA Ao Root diam: 3.30 cm Ao Asc diam:  2.50 cm TRICUSPID VALVE TR Peak grad:   31.8 mmHg TR Vmax:        282.00 cm/s Kirk Ruths MD Electronically signed by Kirk Ruths MD Signature Date/Time: 01/04/2021/11:03:04 AM    Final    [name] EKG SITE RITE  Result Date: 01/04/2021 If Site Rite image not attached, placement could not be confirmed due to current cardiac rhythm.    ASSESSMENT:  1.  Left lung cancer: -Presentation to the ER on 10/06/2019 with cough and dizziness. -CT chest with contrast on 10/06/2019 showed large left suprahilar mass completely obstructing left upper lobe bronchus, measuring 7 x 5.3 x 5.8 cm.  Mass extends into AP window and narrows the left mainstem bronchus.   Small subpleural nodule in the left lower lobe measuring 4 mm indeterminate.  AP window nodal mass measuring 3.4 cm.  No supraclavicular adenopathy.  Subcarinal enlarged lymph node measures 1.4 cm. -PET scan on 10/14/2019 shows large hypermetabolic left upper lobe mass surrounding the left hilum, extending into pleural surface not invading the chest wall.  Direct invasion into the AP window and metastatic subcarinal lymph node, T4 N2 M0.  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 -10 pound weight loss in the last 6 months.  Hoarseness for last 1 week. -MRI of the brain on 10/28/2019 with no evidence of metastasis. -Concurrent chemoradiation therapy started on 11/28/2019. Completed 01/02/20 ( chemo) 01/06/2020(radiation) -CT chest on 01/20/2020 showed significant interval decrease in consolidation of the left upper lobe, interval decrease in irregular nodularity and consolidation of the left lower lobe.  Unchanged posttreatment appearance of soft tissue about the left hilum and AP window.  No discretely enlarged mediastinal or hilar lymph nodes.  Trace left pleural effusion, decreased compared to prior exam. -Consolidation durvalumab started on 01/30/2020. -CT chest with contrast on 03/30/2020 with the development of small left-sided pleural effusion with further volume loss.  Bandlike scarring in the left lower lobe since prior study likely posttreatment changes.  Small bone lesion at T1 stable.  1 small nodule in the right chest which is new about 4 mm.  Will monitor.   2.  Social/family history: -He smoked 1 pack/day for 45 years, quit 5 years ago. -No family history of malignancies.   PLAN:  1.  Advanced squamous cell carcinoma of the left lung: - Cycle 2 of docetaxel on 12/19/2020. - Hospitalized from 12/29/2020 through 01/05/2021 with respiratory failure, paroxysmal atrial fibrillation with RVR, and MSSA bacteremia.  He was cardioverted. -  Port-A-Cath was removed. - TEE did not show any vegetation.  EF 55 to 60%. - I have reviewed all his hospitalization records. - He has completed IV antibiotics yesterday.  He has a midline in the left arm. - He started on linezolid 600 mg twice daily starting today for the next 2 weeks. - His energy levels  are improving.  He will call physical therapy back at home.  According to his wife, he is mostly in the couch but he is able to do all his ADLs without help. - We will hold his treatment today.  We will discontinue his midline.  We will reassess him in 2 weeks.  If he is a candidate for chemotherapy continuation, will consider repeat port placement at that time.  Otherwise he will be offered palliative care options.   2.  Hypomagnesemia: - Magnesium today is normal at 1.8.   3.  Atrial fibrillation: - Continue Eliquis 5 mg twice daily.   4.  Weight loss: - Continue Megace 400 mg twice daily.  He is continuing to gain weight.   5.  Hypertension: - Continue metoprolol.  Blood pressure is 133/59.   6.  Hypothyroidism: - Continue Synthroid daily.  7.  Cough: - Continue hydrocodone syrup every 8 hours.  8.  Right frontal lobe mass: - Stereotactic right frontal craniotomy for resection of tumor on 10/26/2020. - No headaches reported.   Orders placed this encounter:  No orders of the defined types were placed in this encounter.    Derek Jack, MD [place] [phone]   I, Thana Ates, am acting as a scribe for Dr. Derek Jack.  I, Derek Jack MD, have reviewed the above documentation for accuracy and completeness, and I agree with the above.

## 2021-01-16 ENCOUNTER — Inpatient Hospital Stay (HOSPITAL_COMMUNITY): Payer: Medicare Other

## 2021-01-16 ENCOUNTER — Inpatient Hospital Stay (HOSPITAL_BASED_OUTPATIENT_CLINIC_OR_DEPARTMENT_OTHER): Payer: Medicare Other | Admitting: Hematology

## 2021-01-16 ENCOUNTER — Other Ambulatory Visit: Payer: Self-pay

## 2021-01-16 VITALS — BP 133/59 | HR 57 | Temp 96.7°F | Resp 19 | Wt 156.3 lb

## 2021-01-16 DIAGNOSIS — R634 Abnormal weight loss: Secondary | ICD-10-CM | POA: Diagnosis not present

## 2021-01-16 DIAGNOSIS — C3492 Malignant neoplasm of unspecified part of left bronchus or lung: Secondary | ICD-10-CM | POA: Diagnosis not present

## 2021-01-16 DIAGNOSIS — C3412 Malignant neoplasm of upper lobe, left bronchus or lung: Secondary | ICD-10-CM | POA: Diagnosis present

## 2021-01-16 DIAGNOSIS — E039 Hypothyroidism, unspecified: Secondary | ICD-10-CM | POA: Diagnosis not present

## 2021-01-16 DIAGNOSIS — Z5111 Encounter for antineoplastic chemotherapy: Secondary | ICD-10-CM | POA: Diagnosis not present

## 2021-01-16 DIAGNOSIS — I10 Essential (primary) hypertension: Secondary | ICD-10-CM | POA: Diagnosis not present

## 2021-01-16 LAB — CBC WITH DIFFERENTIAL/PLATELET
Abs Immature Granulocytes: 0.12 10*3/uL — ABNORMAL HIGH (ref 0.00–0.07)
Basophils Absolute: 0 10*3/uL (ref 0.0–0.1)
Basophils Relative: 0 %
Eosinophils Absolute: 0.1 10*3/uL (ref 0.0–0.5)
Eosinophils Relative: 1 %
HCT: 30.5 % — ABNORMAL LOW (ref 39.0–52.0)
Hemoglobin: 9.7 g/dL — ABNORMAL LOW (ref 13.0–17.0)
Immature Granulocytes: 1 %
Lymphocytes Relative: 7 %
Lymphs Abs: 0.7 10*3/uL (ref 0.7–4.0)
MCH: 32.1 pg (ref 26.0–34.0)
MCHC: 31.8 g/dL (ref 30.0–36.0)
MCV: 101 fL — ABNORMAL HIGH (ref 80.0–100.0)
Monocytes Absolute: 0.7 10*3/uL (ref 0.1–1.0)
Monocytes Relative: 6 %
Neutro Abs: 9.2 10*3/uL — ABNORMAL HIGH (ref 1.7–7.7)
Neutrophils Relative %: 85 %
Platelets: 129 10*3/uL — ABNORMAL LOW (ref 150–400)
RBC: 3.02 MIL/uL — ABNORMAL LOW (ref 4.22–5.81)
RDW: 18.1 % — ABNORMAL HIGH (ref 11.5–15.5)
WBC: 10.8 10*3/uL — ABNORMAL HIGH (ref 4.0–10.5)
nRBC: 0 % (ref 0.0–0.2)

## 2021-01-16 LAB — COMPREHENSIVE METABOLIC PANEL
ALT: 6 U/L (ref 0–44)
AST: 20 U/L (ref 15–41)
Albumin: 2.8 g/dL — ABNORMAL LOW (ref 3.5–5.0)
Alkaline Phosphatase: 38 U/L (ref 38–126)
Anion gap: 5 (ref 5–15)
BUN: 19 mg/dL (ref 8–23)
CO2: 24 mmol/L (ref 22–32)
Calcium: 8 mg/dL — ABNORMAL LOW (ref 8.9–10.3)
Chloride: 104 mmol/L (ref 98–111)
Creatinine, Ser: 0.67 mg/dL (ref 0.61–1.24)
GFR, Estimated: >60 mL/min (ref >60)
Glucose, Bld: 85 mg/dL (ref 70–99)
Potassium: 4.1 mmol/L (ref 3.5–5.1)
Sodium: 133 mmol/L — ABNORMAL LOW (ref 135–145)
Total Bilirubin: 0.6 mg/dL (ref 0.3–1.2)
Total Protein: 5.5 g/dL — ABNORMAL LOW (ref 6.5–8.1)

## 2021-01-16 LAB — MAGNESIUM: Magnesium: 1.8 mg/dL (ref 1.7–2.4)

## 2021-01-16 NOTE — Patient Instructions (Addendum)
Alan [name] at [facility] Discharge Instructions   You were seen and examined today by Dr. Delton Coombes. We will hold your treatment until you complete your antibiotics. Return in 2 weeks as scheduled for lab work and office visit.    Thank you for choosing [place] at [facility] to provide your oncology and hematology care.  To afford each patient quality time with our provider, please arrive at least 15 minutes before your scheduled appointment time.   If you have a lab appointment with the [facility] please come in thru the Main Entrance and check in at the main information desk.  You need to re-schedule your appointment should you arrive 10 or more minutes late.  We strive to give you quality time with our providers, and arriving late affects you and other patients whose appointments are after yours.  Also, if you no show three or more times for appointments you may be dismissed from the clinic at the providers discretion.     Again, thank you for choosing [facility].  Our hope is that these requests will decrease the amount of time that you wait before being seen by our physicians.       _____________________________________________________________  Should you have questions after your visit to [facility], please contact our office at [phone] and follow the prompts.  Our office hours are 8:00 a.m. and 4:30 p.m. Monday - Friday.  Please note that voicemails left after 4:00 p.m. may not be returned until the following business day.  We are closed weekends and major holidays.  You do have access to a nurse 24-7, just call the main number to the clinic [phone] and do not press any options, hold on the line and a nurse will answer the phone.    For prescription refill requests, have your pharmacy contact our office and allow 72 hours.    Due to Covid, you will need to wear a mask upon entering the hospital.  If you do not have a mask, a mask will be given to you at the Main Entrance upon arrival. For doctor visits, patients may have 1 support person age 74 or older with them. For treatment visits, patients can not have anyone with them due to social distancing guidelines and our immunocompromised population.

## 2021-01-16 NOTE — Patient Instructions (Signed)
[place]  Discharge Instructions: Thank you for choosing [facility] to provide your oncology and hematology care.  If you have a lab appointment with the [facility], please come in thru the Main Entrance and check in at the main information desk.  Wear comfortable clothing and clothing appropriate for easy access to any Portacath or PICC line.   We strive to give you quality time with your provider. You may need to reschedule your appointment if you arrive late (15 or more minutes).  Arriving late affects you and other patients whose appointments are after yours.  Also, if you miss three or more appointments without notifying the office, you may be dismissed from the clinic at the provider's discretion.      For prescription refill requests, have your pharmacy contact our office and allow 72 hours for refills to be completed.    Today you received the following chemotherapy and/or immunotherapy agents midline pulled      To help prevent nausea and vomiting after your treatment, we encourage you to take your nausea medication as directed.  BELOW ARE SYMPTOMS THAT SHOULD BE REPORTED IMMEDIATELY: *FEVER GREATER THAN 100.4 F (38 C) OR HIGHER *CHILLS OR SWEATING *NAUSEA AND VOMITING THAT IS NOT CONTROLLED WITH YOUR NAUSEA MEDICATION *UNUSUAL SHORTNESS OF BREATH *UNUSUAL BRUISING OR BLEEDING *URINARY PROBLEMS (pain or burning when urinating, or frequent urination) *BOWEL PROBLEMS (unusual diarrhea, constipation, pain near the anus) TENDERNESS IN MOUTH AND THROAT WITH OR WITHOUT PRESENCE OF ULCERS (sore throat, sores in mouth, or a toothache) UNUSUAL RASH, SWELLING OR PAIN  UNUSUAL VAGINAL DISCHARGE OR ITCHING   Items with * indicate a potential emergency and should be followed up as soon as possible or go to the Emergency Department if any problems should occur.  Please show the CHEMOTHERAPY ALERT CARD or IMMUNOTHERAPY ALERT CARD at check-in to the Emergency  Department and triage nurse.  Should you have questions after your visit or need to cancel or reschedule your appointment, please contact [facility] [phone]  and follow the prompts.  Office hours are 8:00 a.m. to 4:30 p.m. Monday - Friday. Please note that voicemails left after 4:00 p.m. may not be returned until the following business day.  We are closed weekends and major holidays. You have access to a nurse at all times for urgent questions. Please call the main number to the clinic [phone] and follow the prompts.  For any non-urgent questions, you may also contact your provider using MyChart. We now offer e-Visits for anyone 59 and older to request care online for non-urgent symptoms. For details visit [email or web address].   Also download the MyChart app! Go to the app store, search "MyChart", open the app, select [facility], and log in with your MyChart username and password.  Due to Covid, a mask is required upon entering the hospital/clinic. If you do not have a mask, one will be given to you upon arrival. For doctor visits, patients may have 1 support person aged 86 or older with them. For treatment visits, patients cannot have anyone with them due to current Covid guidelines and our immunocompromised population.    PICC Removal, Adult, Care After The following information offers guidance on how to care for yourself after your procedure. Your health care provider may also give you more specific instructions. If you have problems or questions, contact your health care provider. What can I expect after the procedure? After the procedure, it is common to have:  Tenderness or soreness. Redness, swelling, or a scab at the place where your PICC was removed (exit site). Follow these instructions at home: For the first 24 hours after the procedure: Keep the bandage (dressing) on your exit site clean and dry. Do not remove your dressing until your health care provider  tells you to do so. Do not lift anything heavy or do activities that require great effort until your health care provider says it is okay. You should avoid: Lifting weights. Doing yard work. Doing any physical activity with repetitive arm movement. Watch closely for any signs of an air bubble in the vein (air embolism). This is a rare but serious complication. Signs of an air embolism include trouble breathing, wheezing, chest pain, or a fast pulse. If you have signs of an air embolism, call 911 right away and lie down on your left side to keep the air from moving into your lungs. After 24 hours have passed:  Remove your dressing as told by your health care provider. Wash your hands with soap and water for at least 20 seconds before and after you change your dressing. If soap and water are not available, use hand sanitizer. Return to your normal activities as told by your health care provider. A small scab may develop over the exit site. Do not pick at the scab. When bathing or showering, gently wash the exit site with soap and water. Pat it dry. Watch for signs of infection, such as: A fever or chills. Swollen glands under your arm. More redness, swelling, or soreness around your arm. Blood, fluid, or pus coming from your exit site. Warmth or a bad smell coming from your exit site. A red streak spreading away from your exit site. General instructions Take over-the-counter and prescription medicines only as told by your health care provider. Do not take any new medicines without checking with your health care provider first. If you were given an antibiotic ointment, apply it as told by your health care provider. Keep all follow-up visits. This is important. Contact a health care provider if: You have a fever or chills. You have swelling at your exit site or swollen glands under your arm. You have signs of infection at your exit site. You have soreness, redness, or swelling in your arm  that gets worse. Get help right away if: You have numbness or tingling in your fingers, hand, or arm. Your arm looks blue and feels cold. You have signs of an air embolism, such as trouble breathing, wheezing, chest pain, or a fast pulse. These symptoms may be an emergency. Get medical help right away. Call 911. Do not wait to see if the symptoms will go away. Do not drive yourself to the hospital. Summary After a PICC is removed, it is common to have tenderness or soreness, redness, swelling, or a scab at the exit site. Keep the bandage (dressing) over the exit site clean and dry. Do not remove the dressing until your health care provider tells you to do so. Do not lift anything heavy or do activities that require great effort until your health care provider says it is okay. Watch closely for any signs of an air bubble (air embolism). If you have signs of an air embolism, call 911 right away and lie down on your left side. This information is not intended to replace advice given to you by your health care provider. Make sure you discuss any questions you have with your health care provider. Document  Revised: 08/22/2020 Document Reviewed: 08/22/2020 Elsevier Patient Education  [place].

## 2021-01-16 NOTE — Progress Notes (Signed)
Midline Removal Note: S: 1050 O: midline line removed from left antecubital after sterile site prepped per protocol. PICC catheter tip visualized and intact. Pressure dressing applied with medipore tape. A: No redness, ecchymosis, edema, swelling, or drainage noted at site. P: Instructions provided on post midline discharge care, including followup notification instructions. Tolerated procedure well today without incidence.  Stable during and after procedure.  Vital signs table prior to discharge. Discharged in stable condition ambulatory via walker.   Holding treatment today, recently discharged from hospital with sepsis.

## 2021-01-16 NOTE — Progress Notes (Signed)
Patient here for PORT flush with labs.  Patients PORT was recently removed during a hospital stay.  Labs drawn peripherally.  Tolerated well.

## 2021-01-17 ENCOUNTER — Encounter (HOSPITAL_COMMUNITY): Payer: Medicare Other | Admitting: Physical Therapy

## 2021-01-18 ENCOUNTER — Encounter: Payer: Self-pay | Admitting: Pulmonary Disease

## 2021-01-18 ENCOUNTER — Other Ambulatory Visit: Payer: Self-pay

## 2021-01-18 ENCOUNTER — Ambulatory Visit (INDEPENDENT_AMBULATORY_CARE_PROVIDER_SITE_OTHER): Payer: Medicare Other | Admitting: Pulmonary Disease

## 2021-01-18 VITALS — BP 148/60 | HR 54 | Temp 97.6°F | Ht 61.75 in | Wt 156.0 lb

## 2021-01-18 DIAGNOSIS — J449 Chronic obstructive pulmonary disease, unspecified: Secondary | ICD-10-CM

## 2021-01-18 DIAGNOSIS — R7881 Bacteremia: Secondary | ICD-10-CM | POA: Diagnosis not present

## 2021-01-18 DIAGNOSIS — J9601 Acute respiratory failure with hypoxia: Secondary | ICD-10-CM | POA: Diagnosis not present

## 2021-01-18 DIAGNOSIS — J9611 Chronic respiratory failure with hypoxia: Secondary | ICD-10-CM | POA: Diagnosis not present

## 2021-01-18 DIAGNOSIS — B9561 Methicillin susceptible Staphylococcus aureus infection as the cause of diseases classified elsewhere: Secondary | ICD-10-CM

## 2021-01-18 NOTE — Progress Notes (Signed)
Subjective:    Patient ID: Alan [name], male    DOB: Oct 19, 1950, 70 y.o.   MRN: [id number]  HPI  70 year old ex-smoker with metastatic left-sided lung cancer status post chemoradiation, on second line chemotherapy  presents for hospital follow-up.  He was hospitalized for hypertension, atrial fibrillation/AVR and hypoxia and found to have staff aureus bacteremia, TEE was negative, Port-A-Cath was removed by general surgery.  He underwent DCCV at [facility], ED at the time of admission.  He had left-sided patchy infiltrates and was severely hypoxic, but was discharged on room air He was started on linezolid to complete a 14-day course on 11/30  I have reviewed his discharge summary and hospital course in detail   Note that groundglass infiltrates were noted in his right lung on CT chest 10/29  He arrives accompanied by his wife, reviewed recent oncology visit. 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 He had disease progression with brain mets and underwent craniotomy and resection of solitary brain met and was switched to docetaxel & ramucirumab -cycle 1 was on 8/17 and cycle 2 was on 11/2 , completed Keppra and dexamethasone   Past Medical History:  Diagnosis Date   Anxiety    Arthritis    Dysrhythmia    Hyperlipemia    Hypertension    Hyperthyroidism    Lung cancer ([place])    Myocardial infarction ([place])    Paroxysmal atrial fibrillation ([place])    Port-A-Cath in place 11/14/2019   Tachycardia       Past Surgical History:  Procedure Laterality Date   APPLICATION OF CRANIAL NAVIGATION N/A 10/26/2020   Procedure: APPLICATION  OF CRANIAL NAVIGATION;  Surgeon: Consuella Lose, MD;  Location: [facility];  Service: Neurosurgery;  Laterality: N/A;  RM 21   CARDIAC CATHETERIZATION     CRANIOTOMY Right 10/26/2020   Procedure: Stereotactic Right frontal craniotomy for resection of tumor with sonopet;  Surgeon: Consuella Lose, MD;  Location: [facility];  Service: Neurosurgery;  Laterality: Right;   PORT-A-CATH REMOVAL N/A 01/01/2021   Procedure: REMOVAL PORT-A-CATH;  Surgeon: Armandina Gemma, MD;  Location: [facility];  Service: General;  Laterality: N/A;   PORTACATH PLACEMENT Right 11/01/2019   Procedure: INSERTION PORT-A-CATH;  Surgeon: Aviva Signs, MD;  Location: [facility];  Service: General;  Laterality: Right;   TEE WITHOUT CARDIOVERSION N/A 01/04/2021   Procedure: TRANSESOPHAGEAL ECHOCARDIOGRAM (TEE);  Surgeon: Lelon Perla, MD;  Location: [facility] ENDOSCOPY;  Service: Cardiovascular;  Laterality: N/A;    No Known Allergies  Social History   Socioeconomic History   Marital status: Married    Spouse name: Not on file   Number of children: 1   Years of education: Not on file   Highest education level: Not on file  Occupational History   Occupation: retired   Occupation: Glass blower/designer: [employer]  Tobacco Use   Smoking status: Former    Years: 45.00    Types: Cigars, Cigarettes    Quit date: 10/19/2014    Years since quitting: 6.2   Smokeless tobacco: Never  Vaping Use   Vaping Use: Never used  Substance and Sexual Activity   Alcohol use: Not Currently  Drug use: No   Sexual activity: Not on file  Other Topics Concern   Not on file  Social History Narrative   Not on file   Social Determinants of Health   Financial Resource Strain: Not on file  Food Insecurity: Not on file  Transportation Needs: Not on file  Physical Activity: Not on file  Stress: Not on file  Social Connections: Not on file  Intimate Partner Violence: Not on file     Family History  Problem Relation Age of Onset   Hypertension  Father    Dementia Father        died age 4   Heart disease Father    Heart failure Other       Review of Systems  Weight loss Dyspnea on exertion Ecchymosis over forearms   Constitutional: negative for anorexia, fevers and sweats  Eyes: negative for irritation, redness and visual disturbance  Ears, nose, mouth, throat, and face: negative for earaches, epistaxis, nasal congestion and sore throat  Respiratory: negative for cough, sputum and wheezing  Cardiovascular: negative for chest pain, dlower extremity edema, orthopnea, palpitations and syncope  Gastrointestinal: negative for abdominal pain, constipation, diarrhea, melena, nausea and vomiting  Genitourinary:negative for dysuria, frequency and hematuria  Hematologic/lymphatic: negative for bleeding, easy bruising and lymphadenopathy  Musculoskeletal:negative for arthralgias, muscle weakness and stiff joints  Neurological: negative for coordination problems, gait problems, headaches and weakness  Endocrine: negative for diabetic symptoms including polydipsia, polyuria and weight loss     Objective:   Physical Exam  Gen. Pleasant, elderly, chronically ill-appearing, in no distress, normal affect ENT - no pallor,icterus, no post nasal drip Neck: No JVD, no thyromegaly, no carotid bruits Lungs: no use of accessory muscles, no dullness to percussion, decreased breath sounds bilateral without rales or rhonchi  Cardiovascular: Rhythm regular, heart sounds  normal, no murmurs or gallops, no peripheral edema Abdomen: soft and non-tender, no hepatosplenomegaly, BS normal. Musculoskeletal: No deformities, no cyanosis or clubbing Neuro:  alert, non focal       Assessment & Plan:    Hypoxia -appears to have resolved. Last chest x-ray was reviewed. CT chest from 10/27 was reviewed  Staff aureus bacteremia -he is being treated with linezolid, platelet count has dropped/thrombocytopenia, this will need to be monitored. We will  recheck CBC next week and if further drop, linezolid Will have to be discontinued  Likely underlying COPD -he wishes to defer further evaluation.  He does have an albuterol MDI to use on an as-needed basis

## 2021-01-18 NOTE — Patient Instructions (Signed)
Platelet count is dropping  X CBC check at [place] office on Wednesday

## 2021-01-22 ENCOUNTER — Inpatient Hospital Stay: Payer: Medicare Other | Admitting: Internal Medicine

## 2021-01-22 ENCOUNTER — Ambulatory Visit (INDEPENDENT_AMBULATORY_CARE_PROVIDER_SITE_OTHER): Payer: Medicare Other | Admitting: Internal Medicine

## 2021-01-22 ENCOUNTER — Encounter: Payer: Self-pay | Admitting: Internal Medicine

## 2021-01-22 ENCOUNTER — Other Ambulatory Visit: Payer: Self-pay

## 2021-01-22 VITALS — BP 124/74 | HR 68 | Temp 97.4°F | Wt 158.0 lb

## 2021-01-22 DIAGNOSIS — B9562 Methicillin resistant Staphylococcus aureus infection as the cause of diseases classified elsewhere: Secondary | ICD-10-CM | POA: Diagnosis not present

## 2021-01-22 DIAGNOSIS — R7881 Bacteremia: Secondary | ICD-10-CM | POA: Diagnosis not present

## 2021-01-22 LAB — COMPLETE METABOLIC PANEL WITH GFR
AG Ratio: 1.5 (calc) (ref 1.0–2.5)
ALT: 9 U/L (ref 9–46)
AST: 16 U/L (ref 10–35)
Albumin: 3.4 g/dL — ABNORMAL LOW (ref 3.6–5.1)
Alkaline phosphatase (APISO): 44 U/L (ref 35–144)
BUN: 15 mg/dL (ref 7–25)
CO2: 27 mmol/L (ref 20–32)
Calcium: 8.6 mg/dL (ref 8.6–10.3)
Chloride: 105 mmol/L (ref 98–110)
Creat: 0.77 mg/dL (ref 0.70–1.28)
Globulin: 2.2 g/dL (calc) (ref 1.9–3.7)
Glucose, Bld: 74 mg/dL (ref 65–99)
Potassium: 4.3 mmol/L (ref 3.5–5.3)
Sodium: 139 mmol/L (ref 135–146)
Total Bilirubin: 0.5 mg/dL (ref 0.2–1.2)
Total Protein: 5.6 g/dL — ABNORMAL LOW (ref 6.1–8.1)
eGFR: 96 mL/min/{1.73_m2} (ref >=60)

## 2021-01-22 LAB — CBC
HCT: 29.9 % — ABNORMAL LOW (ref 38.5–50.0)
Hemoglobin: 9.7 g/dL — ABNORMAL LOW (ref 13.2–17.1)
MCH: 32.1 pg (ref 27.0–33.0)
MCHC: 32.4 g/dL (ref 32.0–36.0)
MCV: 99 fL (ref 80.0–100.0)
MPV: 10 fL (ref 7.5–12.5)
Platelets: 136 10*3/uL — ABNORMAL LOW (ref 140–400)
RBC: 3.02 10*6/uL — ABNORMAL LOW (ref 4.20–5.80)
RDW: 16.1 % — ABNORMAL HIGH (ref 11.0–15.0)
WBC: 8.3 10*3/uL (ref 3.8–10.8)

## 2021-01-22 NOTE — Patient Instructions (Signed)
You are doing well  Will get blood tests today to make sure your linezolid antibiotics are not causing any adverse side effect  I'll contact you eitherway whether to continue or stop linezolid  Continue to follow up with your cancer doctor or lung doctor as discussed with them  No need for follow up with ID clinic at this time.

## 2021-01-22 NOTE — Progress Notes (Signed)
[facility] for Infectious Disease  Patient Active Problem List   Diagnosis Date Noted   Bloodstream infection due to central venous catheter    Staphylococcus aureus bacteremia    Malignancy ([place])    AKI (acute kidney injury) ([place]) 12/29/2020   Anemia associated with chemotherapy 12/29/2020   Hypotension 12/29/2020   Brain metastasis ([place]) 10/11/2020   Lung cancer ([place])    Port-A-Cath in place 11/14/2019   Squamous cell lung cancer, left ([place]) 11/08/2019   Malignant neoplasm of left lung ([place])    Mass of upper lobe of left lung 10/19/2019   Paroxysmal atrial fibrillation with RVR ([place]) 12/25/2010   Hyperlipidemia 12/25/2010   Hyperglycemia 12/25/2010   Noncompliance with medications 12/25/2010   Tobacco abuse 12/25/2010      Subjective:    Patient ID: [name], male    DOB: [birth year], 70 y.o.   MRN: [id number]  Chief Complaint  Patient presents with   Hospitalization Follow-up    HPI:  AJENE CARCHI is a 70 y.o. male hx lung cancer on palliative chemo now here for f/u hospital admission sepsis/mrsa bacteremia  Clabsi dx'ed from his chemoport which was removed 11/15. Tee negative Bcx staph epi and mssa. 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     albuterol (VENTOLIN HFA) 108 (90 Base) MCG/ACT inhaler Inhale 1-2 puffs into the lungs every 6 (six) hours as needed for wheezing or shortness of breath.      ALPRAZolam (XANAX) 0.25 MG tablet TAKE 1 TABLET BY MOUTH THREE TIMES DAILY AS NEEDED FOR ANXIETY 30 tablet 0   apixaban (ELIQUIS) 5 MG TABS tablet Take 1 tablet by mouth twice daily 60 tablet 6   dextromethorphan (DELSYM) 30 MG/5ML liquid Take 30 mg by mouth as needed for cough.     feeding supplement (ENSURE ENLIVE / ENSURE PLUS) LIQD Take 237 mLs by mouth 3 (three) times daily between meals. 237 mL 12   levothyroxine (SYNTHROID) 100 MCG tablet TAKE 1 TABLET BY MOUTH ONCE DAILY BEFORE BREAKFAST 30 tablet 0   lidocaine (XYLOCAINE) 2 % solution TAKE 15 ML BY MOUTH 4 TIMES DAILY WITH MEALS 480 mL 0   linezolid (ZYVOX) 600 MG tablet Take 1 tablet (600 mg total) by mouth 2 (two) times daily for 14 days. Start after completion of IV antibiotics- Start date 01/16/21 28 tablet 0   megestrol (MEGACE) 400 MG/10ML suspension Take 10 mLs (400 mg total) by mouth 2 (two) times daily. 480 mL 1   metoprolol tartrate (LOPRESSOR) 25 MG tablet Take 1 tablet (25 mg total) by mouth 2 (two) times daily. 180 tablet 3   Multiple Vitamin (MULTIVITAMIN WITH MINERALS) TABS tablet Take 1 tablet by mouth daily.     pantoprazole (PROTONIX) 40 MG tablet Take 1 tablet (40 mg total) by mouth daily. 30 tablet 1   No facility-administered medications prior to visit.     Social History  Socioeconomic History   Marital status: Married    Spouse name: Not on file   Number of children: 1   Years of education: Not on file   Highest education level: Not on file  Occupational History   Occupation: retired   Occupation: Glass blower/designer: [employer]  Tobacco Use   Smoking status: Former    Years: 45.00    Types: Cigars, Cigarettes    Quit date: 10/19/2014    Years since quitting: 6.2   Smokeless tobacco: Never  Vaping Use   Vaping Use: Never used  Substance and Sexual Activity   Alcohol use: Not Currently   Drug use: No   Sexual activity: Not on file  Other Topics Concern   Not on file  Social History Narrative    Not on file   Social Determinants of Health   Financial Resource Strain: Not on file  Food Insecurity: Not on file  Transportation Needs: Not on file  Physical Activity: Not on file  Stress: Not on file  Social Connections: Not on file  Intimate Partner Violence: Not on file      Review of Systems    All other ros negative  Objective:    BP 124/74   Pulse 68   Temp (!) 97.4 F (36.3 C) (Oral)   Wt 158 lb (71.7 kg)   BMI 29.13 kg/m  Nursing note and vital signs reviewed.  Physical Exam     General/constitutional: no distress, pleasant HEENT: Normocephalic, PER, Conj Clear, EOMI, Oropharynx clear Neck supple CV: rrr no mrg Lungs: clear to auscultation, normal respiratory effort Abd: Soft, Nontender Ext: no edema Skin: No Rash Neuro: nonfocal MSK: no peripheral joint swelling/tenderness/warmth; back spines nontender     Labs: Lab Results  Component Value Date   WBC 10.8 (H) 01/16/2021   HGB 9.7 (L) 01/16/2021   HCT 30.5 (L) 01/16/2021   MCV 101.0 (H) 01/16/2021   PLT 129 (L) 26/71/2458   Last metabolic panel Lab Results  Component Value Date   GLUCOSE 85 01/16/2021   NA 133 (L) 01/16/2021   K 4.1 01/16/2021   CL 104 01/16/2021   CO2 24 01/16/2021   BUN 19 01/16/2021   CREATININE 0.67 01/16/2021   GFRNONAA >60 01/16/2021   CALCIUM 8.0 (L) 01/16/2021   PHOS 2.0 (L) 10/28/2020   PROT 5.5 (L) 01/16/2021   ALBUMIN 2.8 (L) 01/16/2021   BILITOT 0.6 01/16/2021   ALKPHOS 38 01/16/2021   AST 20 01/16/2021   ALT 6 01/16/2021   ANIONGAP 5 01/16/2021    Micro:  Serology:  Imaging:  Assessment & Plan:   Problem List Items Addressed This Visit   None Visit Diagnoses     MRSA bacteremia    -  Primary         No orders of the defined types were placed in this encounter.    70 y.o. male former smoker, left lung NSC cancer s/p chemoradiation transitioned to second line chemotherapy in setting of disease progression (last cycle  docetaxel and ramucirumab 12/19/2020), admitted in transfer from [place] on 11/12 for acute hypoxic respiratory failure with afib/rvr and septic shock found to have staph aureus bacteremia     #staph aureus bacteremia #staph epi bacteremia Sx of at least a week; nonspecific but in a chemotherapy patient 11/12 bcx positive for both. With the port, suspect both true infectious agent 11/13 bcx repeat ngtd 11/15 had port removed Cxr suggestive new left lung opacity ?cancer  vs pna/pneumonitis vs septic emboli.  11/16 repeat bcx negative 11/13 tte negative 11/18 tee negative for vegetation   12/6 id assessment Clinically doing well without sign of active infection Finishing the planned 4 weeks abx Seems to tolerate linezolid wihtout side effect Platelet level low prior to starting linezolid  -cbc/cmp today -finish 1 more week of linezolid; will let patient know if need to hold linezolid based on cbc; will communicate via mychart -f/u heme-onc/pulm as discussed with them     Follow-up: No follow-ups on file.      Jabier Mutton, [place] for [facility] [phone]  pager   [phone] cell 01/22/2021, 8:58 AM

## 2021-01-28 ENCOUNTER — Other Ambulatory Visit (HOSPITAL_COMMUNITY): Payer: Self-pay | Admitting: Hematology

## 2021-01-28 ENCOUNTER — Inpatient Hospital Stay: Payer: Medicare Other

## 2021-01-29 ENCOUNTER — Other Ambulatory Visit: Payer: Self-pay

## 2021-01-29 ENCOUNTER — Encounter (HOSPITAL_COMMUNITY): Payer: Self-pay | Admitting: Hematology

## 2021-01-29 ENCOUNTER — Ambulatory Visit: Payer: Medicare Other | Admitting: Internal Medicine

## 2021-01-29 ENCOUNTER — Ambulatory Visit
Admission: RE | Admit: 2021-01-29 | Discharge: 2021-01-29 | Disposition: A | Discharge status: Home / Self Care | Payer: Medicare Other | Source: Ambulatory Visit | Attending: Internal Medicine | Admitting: Internal Medicine

## 2021-01-29 DIAGNOSIS — C7931 Secondary malignant neoplasm of brain: Secondary | ICD-10-CM

## 2021-01-29 MED ORDER — GADOBENATE DIMEGLUMINE 529 MG/ML IV SOLN
14.0000 mL | Freq: Once | INTRAVENOUS | Status: AC | PRN
  Administered 2021-01-29: 14 mL via INTRAVENOUS

## 2021-01-30 NOTE — Progress Notes (Signed)
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  PRIOR THERAPY: Concurrent chemoradiation with weekly carboplatin and paclitaxel x 6 cycles from 11/28/2019 to 01/02/2020  NGS Results: not done  CURRENT THERAPY: Taxotere and ramucirumab every 3 weeks  BRIEF ONCOLOGIC HISTORY:  Oncology History  Squamous cell lung cancer, left ([place])  11/08/2019 Initial Diagnosis   Squamous cell lung cancer, left ([place])   11/08/2019 Cancer Staging   Staging form: Lung, AJCC 8th Edition - Clinical stage from 11/08/2019: Stage IIIB (cT4, cN2, cM0) - Signed by Derek Jack, MD on 11/08/2019    11/28/2019 - 01/02/2020 Chemotherapy   The patient had palonosetron (ALOXI) injection 0.25 mg, 0.25 mg, Intravenous,  Once, 6 of 6 cycles Administration: 0.25 mg (11/28/2019), 0.25 mg (12/05/2019), 0.25 mg (12/12/2019), 0.25 mg (12/19/2019), 0.25 mg (12/26/2019), 0.25 mg (01/02/2020) CARBOplatin (PARAPLATIN) 180 mg in sodium chloride 0.9 % 250 mL chemo infusion, 180 mg (100 % of original dose 176 mg), Intravenous,  Once, 6 of 6 cycles Dose modification:   (original dose 176 mg, Cycle 1),   (original dose 201.2 mg, Cycle 2),   (original dose 179.2 mg, Cycle 3),   (original dose 179.2 mg, Cycle 4),   (original dose 195.4 mg, Cycle 5),   (original dose 178.2 mg, Cycle 6) Administration: 180 mg (11/28/2019), 200 mg (12/05/2019), 180 mg (12/12/2019), 180 mg (12/19/2019), 190 mg (12/26/2019), 180 mg (01/02/2020) PACLitaxel (TAXOL) 84 mg in sodium chloride 0.9 % 250 mL chemo infusion (</= 80mg /m2), 45 mg/m2 = 84 mg, Intravenous,  Once, 6 of 6 cycles Administration: 84 mg (11/28/2019), 84 mg (12/05/2019), 84 mg (12/12/2019), 84 mg (12/19/2019), 84 mg (12/26/2019), 84 mg (01/02/2020)   for chemotherapy  treatment.     01/30/2020 - 09/11/2020 Chemotherapy          10/03/2020 -  Chemotherapy   Patient is on Treatment Plan : LUNG Docetaxel + Ramucirumab q21d        CANCER STAGING:  Cancer Staging  Squamous cell lung cancer, left ([place]) Staging form: Lung, AJCC 8th Edition - Clinical stage from 11/08/2019: Stage IIIB (cT4, cN2, cM0) - Signed by Derek Jack, MD on 11/08/2019   INTERVAL HISTORY:  [name], a 70 y.o. male, returns for routine follow-up of his left squamous cell lung cancer. Alan [name] was last seen on 01/16/2021.  Today he reports feeling well. He had lost 8 pounds since his last visit. He drinks 2-3 Boost daily, and he is eating well. His activity levels have improved. He denies n/v/d/c. He reports tingling in his legs bilaterally. He denies ankle swellings and numbness in his feet.   REVIEW OF SYSTEMS:  Review of Systems  Constitutional:  Positive for unexpected weight change (-8 lbs). Negative for appetite change and fatigue (50%).  Respiratory:  Positive for cough and shortness of breath.   Cardiovascular:  Negative for leg swelling.  Neurological:  Positive for numbness (tingling in legs bilaterally).  All other systems reviewed and are negative.  PAST MEDICAL/SURGICAL HISTORY:  Past Medical History:  Diagnosis Date   Anxiety    Arthritis    Dysrhythmia    Hyperlipemia    Hypertension    Hyperthyroidism    Lung cancer ([place])    Myocardial infarction ([place])    Paroxysmal atrial fibrillation (  [place])    Port-A-Cath in place 11/14/2019   Tachycardia    Past Surgical History:  Procedure Laterality Date   APPLICATION OF CRANIAL NAVIGATION N/A 10/26/2020   Procedure: APPLICATION OF CRANIAL NAVIGATION;  Surgeon: Consuella Lose, MD;  Location: [facility];  Service: Neurosurgery;  Laterality: N/A;  RM 21   CARDIAC CATHETERIZATION     CRANIOTOMY Right 10/26/2020   Procedure: Stereotactic Right frontal craniotomy for resection of tumor with sonopet;   Surgeon: Consuella Lose, MD;  Location: [facility];  Service: Neurosurgery;  Laterality: Right;   PORT-A-CATH REMOVAL N/A 01/01/2021   Procedure: REMOVAL PORT-A-CATH;  Surgeon: Armandina Gemma, MD;  Location: [facility];  Service: General;  Laterality: N/A;   PORTACATH PLACEMENT Right 11/01/2019   Procedure: INSERTION PORT-A-CATH;  Surgeon: Aviva Signs, MD;  Location: [facility];  Service: General;  Laterality: Right;   TEE WITHOUT CARDIOVERSION N/A 01/04/2021   Procedure: TRANSESOPHAGEAL ECHOCARDIOGRAM (TEE);  Surgeon: Lelon Perla, MD;  Location: [facility];  Service: Cardiovascular;  Laterality: N/A;    SOCIAL HISTORY:  Social History   Socioeconomic History   Marital status: Married    Spouse name: Not on file   Number of children: 1   Years of education: Not on file   Highest education level: Not on file  Occupational History   Occupation: retired   Occupation: Glass blower/designer: [employer]  Tobacco Use   Smoking status: Former    Years: 45.00    Types: Cigars, Cigarettes    Quit date: 10/19/2014    Years since quitting: 6.2   Smokeless tobacco: Never  Vaping Use   Vaping Use: Never used  Substance and Sexual Activity   Alcohol use: Not Currently   Drug use: No   Sexual activity: Not on file  Other Topics Concern   Not on file  Social History Narrative   Not on file   Social Determinants of Health   Financial Resource Strain: Not on file  Food Insecurity: Not on file  Transportation Needs: Not on file  Physical Activity: Not on file  Stress: Not on file  Social Connections: Not on file  Intimate Partner Violence: Not on file    FAMILY HISTORY:  Family History  Problem Relation Age of Onset   Hypertension Father    Dementia Father        died age 5   Heart disease Father    Heart failure Other     CURRENT MEDICATIONS:  Current Outpatient Medications  Medication Sig Dispense Refill   acetaminophen (TYLENOL) 500 MG tablet Take 500 mg by mouth every 6  (six) hours as needed for moderate pain or headache.     albuterol (VENTOLIN HFA) 108 (90 Base) MCG/ACT inhaler Inhale 1-2 puffs into the lungs every 6 (six) hours as needed for wheezing or shortness of breath.     ALPRAZolam (XANAX) 0.25 MG tablet TAKE 1 TABLET BY MOUTH THREE TIMES DAILY AS NEEDED FOR ANXIETY 30 tablet 0   apixaban (ELIQUIS) 5 MG TABS tablet Take 1 tablet by mouth twice daily 60 tablet 6   dextromethorphan (DELSYM) 30 MG/5ML liquid Take 30 mg by mouth as needed for cough.     feeding supplement (ENSURE ENLIVE / ENSURE PLUS) LIQD Take 237 mLs by mouth 3 (three) times daily between meals. 237 mL 12   levothyroxine (SYNTHROID) 100 MCG tablet TAKE 1 TABLET BY MOUTH BEFORE BREAKFAST 30 tablet 3   lidocaine (XYLOCAINE) 2 % solution TAKE 15 ML  BY MOUTH 4 TIMES DAILY WITH MEALS 480 mL 0   megestrol (MEGACE) 400 MG/10ML suspension Take 10 mLs (400 mg total) by mouth 2 (two) times daily. 480 mL 1   metoprolol tartrate (LOPRESSOR) 25 MG tablet Take 1 tablet (25 mg total) by mouth 2 (two) times daily. 180 tablet 3   Multiple Vitamin (MULTIVITAMIN WITH MINERALS) TABS tablet Take 1 tablet by mouth daily.     No current facility-administered medications for this visit.    ALLERGIES:  No Known Allergies  PHYSICAL EXAM:  Performance status (ECOG): 1 - Symptomatic but completely ambulatory  Vitals:   01/31/21 0937  BP: (!) 146/73  Pulse: 84  Resp: 18  Temp: 97.9 F (36.6 C)  SpO2: 100%   Wt Readings from Last 3 Encounters:  01/31/21 149 lb 8 oz (67.8 kg)  01/22/21 158 lb (71.7 kg)  01/18/21 156 lb (70.8 kg)   Physical Exam Vitals reviewed.  Constitutional:      Appearance: Normal appearance.  Cardiovascular:     Rate and Rhythm: Normal rate and regular rhythm.     Pulses: Normal pulses.     Heart sounds: Normal heart sounds.  Pulmonary:     Effort: Pulmonary effort is normal.     Breath sounds: Wheezing (occasional; inspiratory) and rhonchi (occasional; inspiratory)  present.  Neurological:     General: No focal deficit present.     Mental Status: He is alert and oriented to person, place, and time.  Psychiatric:        Mood and Affect: Mood normal.        Behavior: Behavior normal.     LABORATORY DATA:  I have reviewed the labs as listed.  CBC Latest Ref Rng & Units 01/31/2021 01/22/2021 01/16/2021  WBC 4.0 - 10.5 K/uL 5.1 8.3 10.8(H)  Hemoglobin 13.0 - 17.0 g/dL 10.0(L) 9.7(L) 9.7(L)  Hematocrit 39.0 - 52.0 % 31.0(L) 29.9(L) 30.5(L)  Platelets 150 - 400 K/uL 146(L) 136(L) 129(L)   CMP Latest Ref Rng & Units 01/31/2021 01/22/2021 01/16/2021  Glucose 70 - 99 mg/dL 88 74 85  BUN 8 - 23 mg/dL 17 15 19   Creatinine 0.61 - 1.24 mg/dL 0.87 0.77 0.67  Sodium 135 - 145 mmol/L 135 139 133(L)  Potassium 3.5 - 5.1 mmol/L 4.7 4.3 4.1  Chloride 98 - 111 mmol/L 100 105 104  CO2 22 - 32 mmol/L 24 27 24   Calcium 8.9 - 10.3 mg/dL 9.2 8.6 8.0(L)  Total Protein 6.5 - 8.1 g/dL 6.8 5.6(L) 5.5(L)  Total Bilirubin 0.3 - 1.2 mg/dL 0.6 0.5 0.6  Alkaline Phos 38 - 126 U/L 60 - 38  AST 15 - 41 U/L 19 16 20   ALT 0 - 44 U/L 16 9 6     DIAGNOSTIC IMAGING:  I have independently reviewed the scans and discussed with the patient. MR BRAIN W WO CONTRAST  Result Date: 01/30/2021 CLINICAL DATA:  Lung cancer, brain metastases, status post craniotomy 10/26/2020 EXAM: MRI HEAD WITHOUT AND WITH CONTRAST TECHNIQUE: Multiplanar, multiecho pulse sequences of the brain and surrounding structures were obtained without and with intravenous contrast. CONTRAST:  38mL MULTIHANCE GADOBENATE DIMEGLUMINE 529 MG/ML IV SOLN COMPARISON:  10/26/2020, 10/12/2020. FINDINGS: Brain: Status post right frontal craniotomy with subjacent resection cavity in the right frontal lobe. Resolution of previously noted immediate postsurgical changes, including pneumocephalus. Slight interval retraction of the resection cavity. Minimal thin peripheral enhancement about the resection cavity, likely postsurgical,  there is also mild restricted diffusion about the resection cavity, also favored  to be postsurgical. No significant nodular enhancement. Overall decreased surrounding T2 hyperintense signal compared to 10/12/2020 and 10/26/2020. Additional foci of increased signal on diffusion-weighted imaging in the left posterior frontal lobe (series 3, image 67), left parietal lobe (series 3, image 63), and left internal capsule (series 3, image 61), likely acute or subacute infarcts; these do not demonstrate associated enhancement. Blood products are seen in the resection cavity. No acute hemorrhage. No new enhancing lesions. No mass effect or midline shift. No hydrocephalus. Vascular: Normal flow voids. Skull and upper cervical spine: Status post right craniotomy. Normal marrow signal. Degenerative changes in the imaged cervical spine. Sinuses/Orbits: Mild mucosal thickening in left greater than right maxillary sinus and ethmoid air cells. The orbits are unremarkable. Other: Fluid in the left mastoid air cells. IMPRESSION: 1. Status post right frontal craniotomy and resection of a right frontal lobe mass. Slight interval retraction of the resection cavity with minimal thin peripheral enhancement, favored to be postsurgical, although residual tumor cannot be excluded. Attention on follow-up. Overall decreased surrounding edema. 2. Foci of increased signal on diffusion-weighted imaging in the left posterior frontal lobe, left parietal lobe, and left internal capsule, likely acute or subacute infarcts. 3. No new foci of metastatic disease. Electronically Signed   By: Merilyn Baba M.D.   On: 01/30/2021 21:38   ECHO TEE  Result Date: 01/04/2021    TRANSESOPHOGEAL ECHO REPORT   Patient Name:   Alan [name] Date of Exam: 01/04/2021 Medical Rec #:  [id number]      Height:       65.0 in Accession #:    [id number]     Weight:       155.6 lb Date of Birth:  1950-06-06      BSA:          1.778 m Patient Age:    65 years       BP:            111/50 mmHg Patient Gender: M              HR:           68 bpm. Exam Location:  Inpatient Procedure: Transesophageal Echo, Color Doppler and Cardiac Doppler Indications:     Bacteremia  History:         Patient has prior history of Echocardiogram examinations, most                  recent 12/30/2020. Staphylococcus aureus bacteremia,                  Hypotension, Pneumonia/ Chemotherapy pneumonitis, Lung cancer.                  History of Afib.  Sonographer:     Darlina Sicilian RDCS Referring Phys:  [id number] RHONDA G BARRETT Diagnosing Phys: Kirk Ruths MD PROCEDURE: After discussion of the risks and benefits of a TEE, an informed consent was obtained from the patient. TEE procedure time was 6 minutes. The transesophogeal probe was passed without difficulty through the esophogus of the patient. Imaged were  obtained with the patient in a left lateral decubitus position. Local oropharyngeal anesthetic was provided with Cetacaine. Sedation performed by different physician. The patient was monitored while under deep sedation. Anesthestetic sedation was provided intravenously by Anesthesiology: 116.48mg  of Propofol. Image quality was good. The patient's vital signs; including heart rate, blood pressure, and oxygen saturation; remained stable throughout the procedure. The patient developed no complications during the  procedure. IMPRESSIONS  1. No vegetations.  2. Left ventricular ejection fraction, by estimation, is 55 to 60%. The left ventricle has normal function.  3. Right ventricular systolic function is normal. The right ventricular size is normal.  4. Left atrial size was moderately dilated. No left atrial/left atrial appendage thrombus was detected.  5. A small pericardial effusion is present.  6. The mitral valve is normal in structure. Mild mitral valve regurgitation.  7. The aortic valve is normal in structure. Aortic valve regurgitation is not visualized.  8. There is mild (Grade II) plaque involving  the descending aorta. FINDINGS  Left Ventricle: Left ventricular ejection fraction, by estimation, is 55 to 60%. The left ventricle has normal function. The left ventricular internal cavity size was normal in size. Right Ventricle: The right ventricular size is normal. Right vetricular wall thickness was not assessed. Right ventricular systolic function is normal. Left Atrium: Left atrial size was moderately dilated. No left atrial/left atrial appendage thrombus was detected. Right Atrium: Right atrial size was normal in size. Pericardium: A small pericardial effusion is present. Mitral Valve: The mitral valve is normal in structure. Mild mitral valve regurgitation. Tricuspid Valve: The tricuspid valve is normal in structure. Tricuspid valve regurgitation is mild. Aortic Valve: The aortic valve is normal in structure. Aortic valve regurgitation is not visualized. Pulmonic Valve: The pulmonic valve was normal in structure. Pulmonic valve regurgitation is trivial. Aorta: The aortic root is normal in size and structure. There is mild (Grade II) plaque involving the descending aorta. IAS/Shunts: No atrial level shunt detected by color flow Doppler. Additional Comments: No vegetations.   AORTA Ao Root diam: 3.30 cm Ao Asc diam:  2.50 cm TRICUSPID VALVE TR Peak grad:   31.8 mmHg TR Vmax:        282.00 cm/s Kirk Ruths MD Electronically signed by Kirk Ruths MD Signature Date/Time: 01/04/2021/11:03:04 AM    Final    [name] EKG SITE RITE  Result Date: 01/04/2021 If Site Rite image not attached, placement could not be confirmed due to current cardiac rhythm.    ASSESSMENT:  1.  Left lung cancer: -Presentation to the ER on 10/06/2019 with cough and dizziness. -CT chest with contrast on 10/06/2019 showed large left suprahilar mass completely obstructing left upper lobe bronchus, measuring 7 x 5.3 x 5.8 cm.  Mass extends into AP window and narrows the left mainstem bronchus.  Small subpleural nodule in the left lower  lobe measuring 4 mm indeterminate.  AP window nodal mass measuring 3.4 cm.  No supraclavicular adenopathy.  Subcarinal enlarged lymph node measures 1.4 cm. -PET scan on 10/14/2019 shows large hypermetabolic left upper lobe mass surrounding the left hilum, extending into pleural surface not invading the chest wall.  Direct invasion into the AP window and metastatic subcarinal lymph node, T4 N2 M0.  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 -10 pound weight loss in the last 6 months.  Hoarseness for last 1 week. -MRI of the brain on 10/28/2019 with no evidence of metastasis. -Concurrent chemoradiation therapy started on 11/28/2019. Completed 01/02/20 ( chemo) 01/06/2020(radiation) -CT chest on 01/20/2020 showed significant interval decrease in consolidation of the left upper lobe, interval decrease in irregular nodularity and consolidation of the left lower lobe.  Unchanged posttreatment appearance of soft tissue about the left hilum and AP window.  No discretely enlarged mediastinal or hilar lymph nodes.  Trace left pleural effusion, decreased compared to prior exam. -Consolidation durvalumab started  on 01/30/2020. -CT chest with contrast on 03/30/2020 with the development of small left-sided pleural effusion with further volume loss.  Bandlike scarring in the left lower lobe since prior study likely posttreatment changes.  Small bone lesion at T1 stable.  1 small nodule in the right chest which is new about 4 mm.  Will monitor.   2.  Social/family history: -He smoked 1 pack/day for 45 years, quit 5 years ago. -No family history of malignancies.   PLAN:  1.  Advanced squamous cell carcinoma of the left lung: - He had completed 2 cycles of docetaxel, last 1 on 12/19/2020. - He was hospitalized from 12/29/2020 - 01/05/2021 with respiratory failure, paroxysmal atrial fibrillation with RVR and MSSA bacteremia.  He was cardioverted.  Port-A-Cath was  removed. - TEE did not show any vegetations.  EF 55-60%. - Linezolid was completed on 01/29/2021. - He is still feeling weak.  Denies any GI symptoms.  He has some tingling and numbness in the legs in the fingertips on and off. - Recommend holding treatment at this time.  I plan to see him back in 3 to 4 weeks with repeat CT of the chest with contrast.  If there is any improvement in functional status, will consider chemotherapy.  Otherwise will refer to palliative care/hospice.   2.  Hypomagnesemia: - Magnesium is 1.6.  Will monitor closely.   3.  Atrial fibrillation: - Continue Eliquis 5 mg twice daily.   4.  Weight loss: - He lost about 8 pounds since last visit.  He is drinking 2 Ensure Plus daily and eating well. - Continue Megace 400 mg twice daily. - Weight loss is likely due to discontinuation of steroids.   5.  Hypertension: - Continue metoprolol.  Blood pressure is 146/73.   6.  Hypothyroidism: - Continue Synthroid daily.  TSH today 0.367.  7.  Cough: - Continue hydrocodone syrup every 8 hours.  8.  Right frontal lobe mass: - Stereotactic right frontal craniotomy and resection of tumor on 10/26/2020.  No headaches reported.   Orders placed this encounter:  No orders of the defined types were placed in this encounter.    Derek Jack, MD [facility] [phone]   I, Thana Ates, am acting as a scribe for Dr. Derek Jack.  I, Derek Jack MD, have reviewed the above documentation for accuracy and completeness, and I agree with the above.

## 2021-01-31 ENCOUNTER — Other Ambulatory Visit: Payer: Self-pay

## 2021-01-31 ENCOUNTER — Inpatient Hospital Stay (HOSPITAL_COMMUNITY): Payer: Medicare Other | Attending: Hematology | Admitting: Hematology

## 2021-01-31 ENCOUNTER — Inpatient Hospital Stay (HOSPITAL_COMMUNITY): Payer: Medicare Other

## 2021-01-31 VITALS — BP 146/73 | HR 84 | Temp 97.9°F | Resp 18 | Ht 62.0 in | Wt 149.5 lb

## 2021-01-31 DIAGNOSIS — I480 Paroxysmal atrial fibrillation: Secondary | ICD-10-CM | POA: Diagnosis not present

## 2021-01-31 DIAGNOSIS — R634 Abnormal weight loss: Secondary | ICD-10-CM | POA: Diagnosis not present

## 2021-01-31 DIAGNOSIS — C3492 Malignant neoplasm of unspecified part of left bronchus or lung: Secondary | ICD-10-CM | POA: Diagnosis present

## 2021-01-31 DIAGNOSIS — E039 Hypothyroidism, unspecified: Secondary | ICD-10-CM | POA: Insufficient documentation

## 2021-01-31 DIAGNOSIS — Z95828 Presence of other vascular implants and grafts: Secondary | ICD-10-CM

## 2021-01-31 DIAGNOSIS — I119 Hypertensive heart disease without heart failure: Secondary | ICD-10-CM | POA: Insufficient documentation

## 2021-01-31 LAB — TSH: TSH: 0.367 u[IU]/mL (ref 0.350–4.500)

## 2021-01-31 LAB — COMPREHENSIVE METABOLIC PANEL
ALT: 16 U/L (ref 0–44)
AST: 19 U/L (ref 15–41)
Albumin: 3.6 g/dL (ref 3.5–5.0)
Alkaline Phosphatase: 60 U/L (ref 38–126)
Anion gap: 11 (ref 5–15)
BUN: 17 mg/dL (ref 8–23)
CO2: 24 mmol/L (ref 22–32)
Calcium: 9.2 mg/dL (ref 8.9–10.3)
Chloride: 100 mmol/L (ref 98–111)
Creatinine, Ser: 0.87 mg/dL (ref 0.61–1.24)
GFR, Estimated: >60 mL/min (ref >60)
Glucose, Bld: 88 mg/dL (ref 70–99)
Potassium: 4.7 mmol/L (ref 3.5–5.1)
Sodium: 135 mmol/L (ref 135–145)
Total Bilirubin: 0.6 mg/dL (ref 0.3–1.2)
Total Protein: 6.8 g/dL (ref 6.5–8.1)

## 2021-01-31 LAB — CBC WITH DIFFERENTIAL/PLATELET
Abs Immature Granulocytes: 0.02 10*3/uL (ref 0.00–0.07)
Basophils Absolute: 0.1 10*3/uL (ref 0.0–0.1)
Basophils Relative: 1 %
Eosinophils Absolute: 0.1 10*3/uL (ref 0.0–0.5)
Eosinophils Relative: 1 %
HCT: 31 % — ABNORMAL LOW (ref 39.0–52.0)
Hemoglobin: 10 g/dL — ABNORMAL LOW (ref 13.0–17.0)
Immature Granulocytes: 0 %
Lymphocytes Relative: 15 %
Lymphs Abs: 0.8 10*3/uL (ref 0.7–4.0)
MCH: 32.1 pg (ref 26.0–34.0)
MCHC: 32.3 g/dL (ref 30.0–36.0)
MCV: 99.4 fL (ref 80.0–100.0)
Monocytes Absolute: 0.3 10*3/uL (ref 0.1–1.0)
Monocytes Relative: 6 %
Neutro Abs: 3.9 10*3/uL (ref 1.7–7.7)
Neutrophils Relative %: 77 %
Platelets: 146 10*3/uL — ABNORMAL LOW (ref 150–400)
RBC: 3.12 MIL/uL — ABNORMAL LOW (ref 4.22–5.81)
RDW: 16.1 % — ABNORMAL HIGH (ref 11.5–15.5)
WBC: 5.1 10*3/uL (ref 4.0–10.5)
nRBC: 0 % (ref 0.0–0.2)

## 2021-01-31 LAB — URINALYSIS, DIPSTICK ONLY
Bilirubin Urine: NEGATIVE
Glucose, UA: NEGATIVE mg/dL
Hgb urine dipstick: NEGATIVE
Ketones, ur: NEGATIVE mg/dL
Leukocytes,Ua: NEGATIVE
Nitrite: NEGATIVE
Protein, ur: NEGATIVE mg/dL
Specific Gravity, Urine: 1.02 (ref 1.005–1.030)
pH: 8 (ref 5.0–8.0)

## 2021-01-31 LAB — MAGNESIUM: Magnesium: 1.6 mg/dL — ABNORMAL LOW (ref 1.7–2.4)

## 2021-01-31 NOTE — Patient Instructions (Addendum)
[place] [facility] Discharge Instructions   You were seen and examined today by Dr. Delton Coombes. Continue physical therapy exercises at home. Continue Ensure 2-3 cans per day. We will repeat a scan prior to next visit. Return as scheduled in 3 weeks.     Thank you for choosing [place] [facility] to provide your oncology and hematology care.  To afford each patient quality time with our provider, please arrive at least 15 minutes before your scheduled appointment time.   If you have a lab appointment with the [facility] please come in thru the Main Entrance and check in at the main information desk.  You need to re-schedule your appointment should you arrive 10 or more minutes late.  We strive to give you quality time with our providers, and arriving late affects you and other patients whose appointments are after yours.  Also, if you no show three or more times for appointments you may be dismissed from the clinic at the providers discretion.     Again, thank you for choosing [facility].  Our hope is that these requests will decrease the amount of time that you wait before being seen by our physicians.       _____________________________________________________________  Should you have questions after your visit to [facility] please contact our office at [phone] and follow the prompts.  Our office hours are 8:00 a.m. and 4:30 p.m. Monday - Friday.  Please note that voicemails left after 4:00 p.m. may not be returned until the following business day.  We are closed weekends and major holidays.  You do have access to a nurse 24-7, just call the main number to the clinic [phone] and do not press any options, hold on the line and a nurse will answer the phone.    For prescription refill requests, have your pharmacy contact our office and allow 72 hours.    Due to Covid, you will need to wear a  mask upon entering the hospital. If you do not have a mask, a mask will be given to you at the Main Entrance upon arrival. For doctor visits, patients may have 1 support person age 55 or older with them. For treatment visits, patients can not have anyone with them due to social distancing guidelines and our immunocompromised population.

## 2021-02-01 LAB — T4: T4, Total: 11.1 ug/dL (ref 4.5–12.0)

## 2021-02-04 ENCOUNTER — Other Ambulatory Visit: Payer: Self-pay

## 2021-02-04 ENCOUNTER — Inpatient Hospital Stay: Payer: Medicare Other

## 2021-02-04 ENCOUNTER — Inpatient Hospital Stay: Payer: Medicare Other | Attending: Internal Medicine | Admitting: Internal Medicine

## 2021-02-04 VITALS — BP 135/54 | HR 77 | Temp 97.8°F | Resp 18 | Ht 62.0 in | Wt 153.3 lb

## 2021-02-04 DIAGNOSIS — C7931 Secondary malignant neoplasm of brain: Secondary | ICD-10-CM | POA: Diagnosis not present

## 2021-02-04 DIAGNOSIS — C3492 Malignant neoplasm of unspecified part of left bronchus or lung: Secondary | ICD-10-CM | POA: Insufficient documentation

## 2021-02-04 NOTE — Progress Notes (Signed)
[facility] at [place], [place] [address] [phone]   Interval Evaluation  Date of Service: 02/04/21 Patient Name: Alan [name] Patient MRN: [id number] Patient DOB: May 11, 1950 Provider: Ventura Sellers, MD  Identifying Statement:  Alan [name] is a 70 y.o. male with Brain metastasis [facility])   Primary Cancer:  Oncologic History: Oncology History  Squamous cell lung cancer, left ([place])  11/08/2019 Initial Diagnosis   Squamous cell lung cancer, left ([place])   11/08/2019 Cancer Staging   Staging form: Lung, AJCC 8th Edition - Clinical stage from 11/08/2019: Stage IIIB (cT4, cN2, cM0) - Signed by Alan Jack, MD on 11/08/2019    11/28/2019 - 01/02/2020 Chemotherapy   The patient had palonosetron (ALOXI) injection 0.25 mg, 0.25 mg, Intravenous,  Once, 6 of 6 cycles Administration: 0.25 mg (11/28/2019), 0.25 mg (12/05/2019), 0.25 mg (12/12/2019), 0.25 mg (12/19/2019), 0.25 mg (12/26/2019), 0.25 mg (01/02/2020) CARBOplatin (PARAPLATIN) 180 mg in sodium chloride 0.9 % 250 mL chemo infusion, 180 mg (100 % of original dose 176 mg), Intravenous,  Once, 6 of 6 cycles Dose modification:   (original dose 176 mg, Cycle 1),   (original dose 201.2 mg, Cycle 2),   (original dose 179.2 mg, Cycle 3),   (original dose 179.2 mg, Cycle 4),   (original dose 195.4 mg, Cycle 5),   (original dose 178.2 mg, Cycle 6) Administration: 180 mg (11/28/2019), 200 mg (12/05/2019), 180 mg (12/12/2019), 180 mg (12/19/2019), 190 mg (12/26/2019), 180 mg (01/02/2020) PACLitaxel (TAXOL) 84 mg in sodium chloride 0.9 % 250 mL chemo infusion (</= 26m/m2), 45 mg/m2 = 84 mg, Intravenous,  Once, 6 of 6 cycles Administration: 84 mg (11/28/2019), 84 mg (12/05/2019), 84 mg (12/12/2019), 84 mg (12/19/2019), 84 mg (12/26/2019), 84 mg (01/02/2020)   for chemotherapy treatment.     01/30/2020 - 09/11/2020 Chemotherapy          10/03/2020 -  Chemotherapy   Patient is on  Treatment Plan : LUNG Docetaxel + Ramucirumab q21d       CNS Oncologic History 10/25/20: Completes 27Gy/375fas pre-op SRS to R frontal met (MLisbeth Renshaw09/09/22: Craniotomy, resection R frontal with Dr. NuKathyrn SheriffInterval History: Alan [name] today for follow up after recent MRI brain.  He was hospitalized recently for blood infection, respiratory issues; has just this past week completed linezolid antibiotic therapy.  Otherwise denies any new or progressive neurologic changes.  No seizures or headaches.  Maintains somewhat independent status, uses walker for outings but walks on his own around the home.    H+P (11/27/20) Patient presents today for follow up, now one month removed from pre-op radiation and craniotomy.  Since diagnosis, treatment, he has felt very tired, withdrawn.  He is moving/exercising minimally due to fatigue, imbalance, and occasional light-headedness.  He is independent with ADLs for the most part, but doesn't feel motivation to eat or be active/engaged.  He is walking around the home with a cane currently.  Dr. KaDelton Coombess currently holding chemo for squamous lung cancer due to functional impairment, low albumin.  Medications: Current Outpatient Medications on File Prior to Visit  Medication Sig Dispense Refill   acetaminophen (TYLENOL) 500 MG tablet Take 500 mg by mouth every 6 (six) hours as needed for moderate pain or headache.     albuterol (VENTOLIN HFA) 108 (90 Base) MCG/ACT inhaler Inhale 1-2 puffs into the lungs every 6 (six) hours as needed for wheezing or shortness of breath.  ALPRAZolam (XANAX) 0.25 MG tablet TAKE 1 TABLET BY MOUTH THREE TIMES DAILY AS NEEDED FOR ANXIETY 30 tablet 0   apixaban (ELIQUIS) 5 MG TABS tablet Take 1 tablet by mouth twice daily 60 tablet 6   dextromethorphan (DELSYM) 30 MG/5ML liquid Take 30 mg by mouth as needed for cough.     feeding supplement (ENSURE ENLIVE / ENSURE PLUS) LIQD Take 237 mLs by mouth 3 (three) times  daily between meals. 237 mL 12   levothyroxine (SYNTHROID) 100 MCG tablet TAKE 1 TABLET BY MOUTH BEFORE BREAKFAST 30 tablet 3   lidocaine (XYLOCAINE) 2 % solution TAKE 15 ML BY MOUTH 4 TIMES DAILY WITH MEALS 480 mL 0   megestrol (MEGACE) 400 MG/10ML suspension Take 10 mLs (400 mg total) by mouth 2 (two) times daily. 480 mL 1   metoprolol tartrate (LOPRESSOR) 25 MG tablet Take 1 tablet (25 mg total) by mouth 2 (two) times daily. 180 tablet 3   Multiple Vitamin (MULTIVITAMIN WITH MINERALS) TABS tablet Take 1 tablet by mouth daily.     No current facility-administered medications on file prior to visit.    Allergies: No Known Allergies Past Medical History:  Past Medical History:  Diagnosis Date   Anxiety    Arthritis    Dysrhythmia    Hyperlipemia    Hypertension    Hyperthyroidism    Lung cancer ([place])    Myocardial infarction ([place])    Paroxysmal atrial fibrillation ([place])    Port-A-Cath in place 11/14/2019   Tachycardia    Past Surgical History:  Past Surgical History:  Procedure Laterality Date   APPLICATION OF CRANIAL NAVIGATION N/A 10/26/2020   Procedure: APPLICATION OF CRANIAL NAVIGATION;  Surgeon: Consuella Lose, MD;  Location: [facility];  Service: Neurosurgery;  Laterality: N/A;  RM 21   CARDIAC CATHETERIZATION     CRANIOTOMY Right 10/26/2020   Procedure: Stereotactic Right frontal craniotomy for resection of tumor with sonopet;  Surgeon: Consuella Lose, MD;  Location: [facility];  Service: Neurosurgery;  Laterality: Right;   PORT-A-CATH REMOVAL N/A 01/01/2021   Procedure: REMOVAL PORT-A-CATH;  Surgeon: Armandina Gemma, MD;  Location: [facility];  Service: General;  Laterality: N/A;   PORTACATH PLACEMENT Right 11/01/2019   Procedure: INSERTION PORT-A-CATH;  Surgeon: Aviva Signs, MD;  Location: [facility];  Service: General;  Laterality: Right;   TEE WITHOUT CARDIOVERSION N/A 01/04/2021   Procedure: TRANSESOPHAGEAL ECHOCARDIOGRAM (TEE);  Surgeon: Lelon Perla, MD;  Location: [facility]  ENDOSCOPY;  Service: Cardiovascular;  Laterality: N/A;   Social History:  Social History   Socioeconomic History   Marital status: Married    Spouse name: Not on file   Number of children: 1   Years of education: Not on file   Highest education level: Not on file  Occupational History   Occupation: retired   Occupation: Glass blower/designer: [employer]  Tobacco Use   Smoking status: Former    Years: 45.00    Types: Cigars, Cigarettes    Quit date: 10/19/2014    Years since quitting: 6.3   Smokeless tobacco: Never  Vaping Use   Vaping Use: Never used  Substance and Sexual Activity   Alcohol use: Not Currently   Drug use: No   Sexual activity: Not on file  Other Topics Concern   Not on file  Social History Narrative   Not on file   Social Determinants of Health   Financial Resource Strain: Not on file  Food Insecurity: Not on file  Transportation  Needs: Not on file  Physical Activity: Not on file  Stress: Not on file  Social Connections: Not on file  Intimate Partner Violence: Not on file   Family History:  Family History  Problem Relation Age of Onset   Hypertension Father    Dementia Father        died age 58   Heart disease Father    Heart failure Other     Review of Systems: Constitutional: Doesn't report fevers, chills or abnormal weight loss Eyes: Doesn't report blurriness of vision Ears, nose, mouth, throat, and face: Doesn't report sore throat Respiratory: Doesn't report cough, dyspnea or wheezes Cardiovascular: Doesn't report palpitation, chest discomfort  Gastrointestinal:  Doesn't report nausea, constipation, diarrhea GU: Doesn't report incontinence Skin: Doesn't report skin rashes Neurological: Per HPI Musculoskeletal: Doesn't report joint pain Behavioral/Psych: Doesn't report anxiety  Physical Exam: Vitals:   02/04/21 1033  BP: (!) 135/54  Pulse: 77  Resp: 18  Temp: 97.8 F (36.6 C)  SpO2: 100%   KPS: 70. General: Alert, cooperative,  pleasant, in no acute distress Head: Normal EENT: Hoarseness Lungs: Resp effort normal Cardiac: Regular rate Abdomen: Non-distended abdomen Skin: No rashes cyanosis or petechiae. Extremities: No clubbing or edema  Neurologic Exam: Mental Status: Awake, alert, attentive to examiner. Oriented to self and environment. Language is fluent with intact comprehension.  Psychomotor slowing. Cranial Nerves: Visual acuity is grossly normal. Visual fields are full. Extra-ocular movements intact. No ptosis. Face is symmetric Motor: Tone and bulk are normal. Power is full in both arms and legs. Reflexes are symmetric, no pathologic reflexes present.  Sensory: Intact to light touch Gait: Dystaxic, cane assisted   Labs: I have reviewed the data as listed    Component Value Date/Time   NA 135 01/31/2021 0829   K 4.7 01/31/2021 0829   CL 100 01/31/2021 0829   CO2 24 01/31/2021 0829   GLUCOSE 88 01/31/2021 0829   BUN 17 01/31/2021 0829   CREATININE 0.87 01/31/2021 0829   CREATININE 0.77 01/22/2021 0906   CALCIUM 9.2 01/31/2021 0829   PROT 6.8 01/31/2021 0829   ALBUMIN 3.6 01/31/2021 0829   AST 19 01/31/2021 0829   ALT 16 01/31/2021 0829   ALKPHOS 60 01/31/2021 0829   BILITOT 0.6 01/31/2021 0829   GFRNONAA >60 01/31/2021 0829   GFRAA >60 11/01/2019 1049   Lab Results  Component Value Date   WBC 5.1 01/31/2021   NEUTROABS 3.9 01/31/2021   HGB 10.0 (L) 01/31/2021   HCT 31.0 (L) 01/31/2021   MCV 99.4 01/31/2021   PLT 146 (L) 01/31/2021   Imaging:  [facility] Clinician Interpretation: I have personally reviewed the CNS images as listed.  My interpretation, in the context of the patient's clinical presentation, is stable disease  MR BRAIN W WO CONTRAST  Result Date: 01/30/2021 CLINICAL DATA:  Lung cancer, brain metastases, status post craniotomy 10/26/2020 EXAM: MRI HEAD WITHOUT AND WITH CONTRAST TECHNIQUE: Multiplanar, multiecho pulse sequences of the brain and surrounding structures were  obtained without and with intravenous contrast. CONTRAST:  29m MULTIHANCE GADOBENATE DIMEGLUMINE 529 MG/ML IV SOLN COMPARISON:  10/26/2020, 10/12/2020. FINDINGS: Brain: Status post right frontal craniotomy with subjacent resection cavity in the right frontal lobe. Resolution of previously noted immediate postsurgical changes, including pneumocephalus. Slight interval retraction of the resection cavity. Minimal thin peripheral enhancement about the resection cavity, likely postsurgical, there is also mild restricted diffusion about the resection cavity, also favored to be postsurgical. No significant nodular enhancement. Overall decreased surrounding T2 hyperintense  signal compared to 10/12/2020 and 10/26/2020. Additional foci of increased signal on diffusion-weighted imaging in the left posterior frontal lobe (series 3, image 67), left parietal lobe (series 3, image 63), and left internal capsule (series 3, image 61), likely acute or subacute infarcts; these do not demonstrate associated enhancement. Blood products are seen in the resection cavity. No acute hemorrhage. No new enhancing lesions. No mass effect or midline shift. No hydrocephalus. Vascular: Normal flow voids. Skull and upper cervical spine: Status post right craniotomy. Normal marrow signal. Degenerative changes in the imaged cervical spine. Sinuses/Orbits: Mild mucosal thickening in left greater than right maxillary sinus and ethmoid air cells. The orbits are unremarkable. Other: Fluid in the left mastoid air cells. IMPRESSION: 1. Status post right frontal craniotomy and resection of a right frontal lobe mass. Slight interval retraction of the resection cavity with minimal thin peripheral enhancement, favored to be postsurgical, although residual tumor cannot be excluded. Attention on follow-up. Overall decreased surrounding edema. 2. Foci of increased signal on diffusion-weighted imaging in the left posterior frontal lobe, left parietal lobe, and  left internal capsule, likely acute or subacute infarcts. 3. No new foci of metastatic disease. Electronically Signed   By: Merilyn Baba M.D.   On: 01/30/2021 21:38     Assessment/Plan Brain metastasis ([place])  Alan [name] is clinically and radiographically stable today.    Will continue to follow with Dr. Delton Coombes who we re-evaluate candidacy for systemic therapy.  He should remain off dexamethasone for now.  We appreciate the opportunity to participate in the care of Alan [name].    We ask that Alan [name] return to clinic in 3 months following next brain MRI, or sooner as needed.  All questions were answered. The patient knows to call the clinic with any problems, questions or concerns. No barriers to learning were detected.  The total time spent in the encounter was 30 minutes and more than 50% was on counseling and review of test results   Alan Sellers, MD Medical Director of Neuro-Oncology [facility] [place] 02/04/21 10:20 AM

## 2021-02-05 ENCOUNTER — Telehealth: Payer: Self-pay | Admitting: Internal Medicine

## 2021-02-05 NOTE — Telephone Encounter (Signed)
Scheduled per 12/19 los, message has been left, will also mail calender

## 2021-02-13 ENCOUNTER — Encounter: Payer: Self-pay | Admitting: Cardiology

## 2021-02-13 ENCOUNTER — Ambulatory Visit (INDEPENDENT_AMBULATORY_CARE_PROVIDER_SITE_OTHER): Payer: Medicare Other | Admitting: Cardiology

## 2021-02-13 ENCOUNTER — Ambulatory Visit (HOSPITAL_COMMUNITY)
Admission: RE | Admit: 2021-02-13 | Discharge: 2021-02-13 | Disposition: A | Discharge status: Home / Self Care | Payer: Medicare Other | Source: Ambulatory Visit | Attending: Hematology | Admitting: Hematology

## 2021-02-13 ENCOUNTER — Other Ambulatory Visit: Payer: Self-pay

## 2021-02-13 VITALS — BP 130/60 | HR 68 | Ht 62.0 in | Wt 152.6 lb

## 2021-02-13 DIAGNOSIS — C3492 Malignant neoplasm of unspecified part of left bronchus or lung: Secondary | ICD-10-CM | POA: Diagnosis present

## 2021-02-13 DIAGNOSIS — I4891 Unspecified atrial fibrillation: Secondary | ICD-10-CM | POA: Diagnosis not present

## 2021-02-13 DIAGNOSIS — I10 Essential (primary) hypertension: Secondary | ICD-10-CM

## 2021-02-13 MED ORDER — IOHEXOL 350 MG/ML SOLN
60.0000 mL | Freq: Once | INTRAVENOUS | Status: AC | PRN
  Administered 2021-02-13: 15:00:00 60 mL via INTRAVENOUS

## 2021-02-13 MED ORDER — SODIUM CHLORIDE (PF) 0.9 % IJ SOLN
INTRAMUSCULAR | Status: AC
  Filled 2021-02-13: qty 50

## 2021-02-13 NOTE — Progress Notes (Signed)
Clinical Summary [name] is a 70 y.o.male seen today for follow up of the following medical problems.   1. PAF - in ER 04/2020 with fecal impaction and rectal pain. Issues with a fib with RVR at the time.  - 04/30/20 TSH 0.2 - av nodal agents have been limited by low bp's - multiple recent provider visits rates 80s to 90s  - lung cancer with mets to brain, from notes onc has been ok continuing eliquis, he is s/p craniotomy with tumor removal.  - issues with afib with RVR during that admission, lopressor was increased to 25mg  tid and added digoxin. At f/u digoxin d/c'd and lopressor weaned back down to prior dosing.    - occasional palpitations, 1-2 times per month up to 5 minutes.  - no bleeding on eliquis.      2. History of lung cancer with brain mets - followed by oncology - s/p craniotomy  3. HTN - compliant with meds   4. 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     albuterol (VENTOLIN HFA) 108 (90 Base) MCG/ACT inhaler Inhale 1-2 puffs into the lungs every 6 (six) hours as needed for wheezing or shortness of breath.     ALPRAZolam (XANAX) 0.25 MG tablet TAKE 1 TABLET BY MOUTH THREE TIMES DAILY AS NEEDED FOR ANXIETY 30 tablet 0   apixaban (ELIQUIS) 5 MG TABS tablet Take 1 tablet by mouth twice daily 60 tablet 6   dextromethorphan (DELSYM) 30 MG/5ML liquid Take 30 mg by mouth as needed for cough.     feeding supplement (ENSURE ENLIVE / ENSURE PLUS) LIQD Take 237 mLs by mouth 3  (three) times daily between meals. 237 mL 12   levothyroxine (SYNTHROID) 100 MCG tablet TAKE 1 TABLET BY MOUTH BEFORE BREAKFAST 30 tablet 3   lidocaine (XYLOCAINE) 2 % solution TAKE 15 ML BY MOUTH 4 TIMES DAILY WITH MEALS 480 mL 0   megestrol (MEGACE) 400 MG/10ML suspension Take 10 mLs (400 mg total) by mouth 2 (two) times daily. 480 mL 1   metoprolol tartrate (LOPRESSOR) 25 MG tablet Take 1 tablet (25 mg total) by mouth 2 (two) times daily. 180 tablet 3   Multiple Vitamin (MULTIVITAMIN WITH MINERALS) TABS tablet Take 1 tablet by mouth daily.     No current facility-administered medications for this visit.     Past Surgical History:  Procedure Laterality Date   APPLICATION OF CRANIAL NAVIGATION N/A 10/26/2020   Procedure: APPLICATION OF CRANIAL NAVIGATION;  Surgeon: Consuella Lose, MD;  Location: [facility];  Service: Neurosurgery;  Laterality: N/A;  RM 21   CARDIAC CATHETERIZATION     CRANIOTOMY Right 10/26/2020   Procedure: Stereotactic Right frontal craniotomy for resection of tumor with sonopet;  Surgeon: Consuella Lose, MD;  Location: [facility];  Service: Neurosurgery;  Laterality: Right;   PORT-A-CATH REMOVAL N/A 01/01/2021   Procedure: REMOVAL PORT-A-CATH;  Surgeon: Armandina Gemma,  MD;  Location: [facility];  Service: General;  Laterality: N/A;   PORTACATH PLACEMENT Right 11/01/2019   Procedure: INSERTION PORT-A-CATH;  Surgeon: Aviva Signs, MD;  Location: [facility];  Service: General;  Laterality: Right;   TEE WITHOUT CARDIOVERSION N/A 01/04/2021   Procedure: TRANSESOPHAGEAL ECHOCARDIOGRAM (TEE);  Surgeon: Lelon Perla, MD;  Location: [facility] ENDOSCOPY;  Service: Cardiovascular;  Laterality: N/A;     No Known Allergies    Family History  Problem Relation Age of Onset   Hypertension Father    Dementia Father        died age 32   Heart disease Father    Heart failure Other      Social History Mr. Blyth reports that he quit smoking about 6 years ago. His smoking use included cigars  and cigarettes. He has never used smokeless tobacco. Mr. Roarty reports that he does not currently use alcohol.   Review of Systems CONSTITUTIONAL: No weight loss, fever, chills, weakness or fatigue.  HEENT: Eyes: No visual loss, blurred vision, double vision or yellow sclerae.No hearing loss, sneezing, congestion, runny nose or sore throat.  SKIN: No rash or itching.  CARDIOVASCULAR: per hpi RESPIRATORY: No shortness of breath, cough or sputum.  GASTROINTESTINAL: No anorexia, nausea, vomiting or diarrhea. No abdominal pain or blood.  GENITOURINARY: No burning on urination, no polyuria NEUROLOGICAL: No headache, dizziness, syncope, paralysis, ataxia, numbness or tingling in the extremities. No change in bowel or bladder control.  MUSCULOSKELETAL: No muscle, back pain, joint pain or stiffness.  LYMPHATICS: No enlarged nodes. No history of splenectomy.  PSYCHIATRIC: No history of depression or anxiety.  ENDOCRINOLOGIC: No reports of sweating, cold or heat intolerance. No polyuria or polydipsia.  Marland Kitchen   Physical Examination Today's Vitals   02/13/21 1051  BP: 130/60  Pulse: 68  SpO2: 97%  Weight: 152 lb 9.6 oz (69.2 kg)  Height: 5\' 2"  (1.575 m)   Body mass index is 27.91 kg/m.  Gen: resting comfortably, no acute distress HEENT: no scleral icterus, pupils equal round and reactive, no palptable cervical adenopathy,  CV: RRR, no m/r/g, no jvd Resp: Clear to auscultation bilaterally GI: abdomen is soft, non-tender, non-distended, normal bowel sounds, no hepatosplenomegaly MSK: extremities are warm, no edema.  Skin: warm, no rash Neuro:  no focal deficits Psych: appropriate affect   Diagnostic Studies  12/2020 TEE IMPRESSIONS     1. No vegetations.   2. Left ventricular ejection fraction, by estimation, is 55 to 60%. The  left ventricle has normal function.   3. Right ventricular systolic function is normal. The right ventricular  size is normal.   4. Left atrial size was  moderately dilated. No left atrial/left atrial  appendage thrombus was detected.   5. A small pericardial effusion is present.   6. The mitral valve is normal in structure. Mild mitral valve  regurgitation.   7. The aortic valve is normal in structure. Aortic valve regurgitation is  not visualized.   8. There is mild (Grade II) plaque involving the descending aorta.    Assessment and Plan  1. Afib - does well on lopressor 25mg  bid at baseline, has had issues with afib with RVR when additional systemic stress such as surgeries, infections, etc during prior admissions.  - continue current meds  2. HTN At goal, continue current meds      Arnoldo Lenis, M.D

## 2021-02-13 NOTE — Patient Instructions (Signed)
Medication Instructions:  Continue all current medications.   Labwork: none  Testing/Procedures: none  Follow-Up: 6 months   Any Other Special Instructions Will Be Listed Below (If Applicable).   If you need a refill on your cardiac medications before your next appointment, please call your pharmacy.  

## 2021-02-20 NOTE — Progress Notes (Signed)
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  PRIOR THERAPY: Concurrent chemoradiation with weekly carboplatin and paclitaxel x 6 cycles from 11/28/2019 to 01/02/2020  NGS Results: not done  CURRENT THERAPY: Taxotere and ramucirumab every 3 weeks  BRIEF ONCOLOGIC HISTORY:  Oncology History  Squamous cell lung cancer, left ([place])  11/08/2019 Initial Diagnosis   Squamous cell lung cancer, left ([place])   11/08/2019 Cancer Staging   Staging form: Lung, AJCC 8th Edition - Clinical stage from 11/08/2019: Stage IIIB (cT4, cN2, cM0) - Signed by Derek Jack, MD on 11/08/2019    11/28/2019 - 01/02/2020 Chemotherapy   The patient had palonosetron (ALOXI) injection 0.25 mg, 0.25 mg, Intravenous,  Once, 6 of 6 cycles Administration: 0.25 mg (11/28/2019), 0.25 mg (12/05/2019), 0.25 mg (12/12/2019), 0.25 mg (12/19/2019), 0.25 mg (12/26/2019), 0.25 mg (01/02/2020) CARBOplatin (PARAPLATIN) 180 mg in sodium chloride 0.9 % 250 mL chemo infusion, 180 mg (100 % of original dose 176 mg), Intravenous,  Once, 6 of 6 cycles Dose modification:   (original dose 176 mg, Cycle 1),   (original dose 201.2 mg, Cycle 2),   (original dose 179.2 mg, Cycle 3),   (original dose 179.2 mg, Cycle 4),   (original dose 195.4 mg, Cycle 5),   (original dose 178.2 mg, Cycle 6) Administration: 180 mg (11/28/2019), 200 mg (12/05/2019), 180 mg (12/12/2019), 180 mg (12/19/2019), 190 mg (12/26/2019), 180 mg (01/02/2020) PACLitaxel (TAXOL) 84 mg in sodium chloride 0.9 % 250 mL chemo infusion (</= 80mg /m2), 45 mg/m2 = 84 mg, Intravenous,  Once, 6 of 6 cycles Administration: 84 mg (11/28/2019), 84 mg (12/05/2019), 84 mg (12/12/2019), 84 mg (12/19/2019), 84 mg (12/26/2019), 84 mg (01/02/2020)   for chemotherapy  treatment.     01/30/2020 - 09/11/2020 Chemotherapy          10/03/2020 -  Chemotherapy   Patient is on Treatment Plan : LUNG Docetaxel + Ramucirumab q21d        CANCER STAGING:  Cancer Staging  Squamous cell lung cancer, left ([facility]) Staging form: Lung, AJCC 8th Edition - Clinical stage from 11/08/2019: Stage IIIB (cT4, cN2, cM0) - Signed by Derek Jack, MD on 11/08/2019   INTERVAL HISTORY:  Mr. SILUS LANZO, a 71 y.o. male, returns for routine follow-up of his left squamous cell lung cancer. Rock was last seen on 01/31/2021.   Today he reports feeling good. He has gained 4 lbs, and his appetite is good. His energy and activity levels have improved.   REVIEW OF SYSTEMS:  Review of Systems  Constitutional:  Negative for appetite change, fatigue and unexpected weight change (+4 lbs).  Respiratory:  Positive for cough.   Neurological:  Positive for numbness.  All other systems reviewed and are negative.  PAST MEDICAL/SURGICAL HISTORY:  Past Medical History:  Diagnosis Date   Anxiety    Arthritis    Dysrhythmia    Hyperlipemia    Hypertension    Hyperthyroidism    Lung cancer ([facility])    Myocardial infarction ([facility])    Paroxysmal atrial fibrillation ([facility])    Port-A-Cath in place 11/14/2019   Tachycardia    Past Surgical History:  Procedure Laterality Date   APPLICATION OF CRANIAL NAVIGATION N/A 10/26/2020   Procedure: APPLICATION OF CRANIAL NAVIGATION;  Surgeon: Consuella Lose, MD;  Location:  [facility];  Service: Neurosurgery;  Laterality: N/A;  RM 21   CARDIAC CATHETERIZATION     CRANIOTOMY Right 10/26/2020   Procedure: Stereotactic Right frontal craniotomy for resection of tumor with sonopet;  Surgeon: Consuella Lose, MD;  Location: [facility];  Service: Neurosurgery;  Laterality: Right;   PORT-A-CATH REMOVAL N/A 01/01/2021   Procedure: REMOVAL PORT-A-CATH;  Surgeon: Armandina Gemma, MD;  Location: [facility];  Service: General;  Laterality: N/A;   PORTACATH PLACEMENT  Right 11/01/2019   Procedure: INSERTION PORT-A-CATH;  Surgeon: Aviva Signs, MD;  Location: [facility];  Service: General;  Laterality: Right;   TEE WITHOUT CARDIOVERSION N/A 01/04/2021   Procedure: TRANSESOPHAGEAL ECHOCARDIOGRAM (TEE);  Surgeon: Lelon Perla, MD;  Location: [facility];  Service: Cardiovascular;  Laterality: N/A;    SOCIAL HISTORY:  Social History   Socioeconomic History   Marital status: Married    Spouse name: Not on file   Number of children: 1   Years of education: Not on file   Highest education level: Not on file  Occupational History   Occupation: retired   Occupation: Glass blower/designer: [employer]  Tobacco Use   Smoking status: Former    Years: 45.00    Types: Cigars, Cigarettes    Quit date: 10/19/2014    Years since quitting: 6.3   Smokeless tobacco: Never  Vaping Use   Vaping Use: Never used  Substance and Sexual Activity   Alcohol use: Not Currently   Drug use: No   Sexual activity: Not on file  Other Topics Concern   Not on file  Social History Narrative   Not on file   Social Determinants of Health   Financial Resource Strain: Not on file  Food Insecurity: Not on file  Transportation Needs: Not on file  Physical Activity: Not on file  Stress: Not on file  Social Connections: Not on file  Intimate Partner Violence: Not on file    FAMILY HISTORY:  Family History  Problem Relation Age of Onset   Hypertension Father    Dementia Father        died age 65   Heart disease Father    Heart failure Other     CURRENT MEDICATIONS:  Current Outpatient Medications  Medication Sig Dispense Refill   acetaminophen (TYLENOL) 500 MG tablet Take 500 mg by mouth every 6 (six) hours as needed for moderate pain or headache.     albuterol (VENTOLIN HFA) 108 (90 Base) MCG/ACT inhaler Inhale 1-2 puffs into the lungs every 6 (six) hours as needed for wheezing or shortness of breath.     ALPRAZolam (XANAX) 0.25 MG tablet TAKE 1 TABLET BY MOUTH THREE  TIMES DAILY AS NEEDED FOR ANXIETY 30 tablet 0   apixaban (ELIQUIS) 5 MG TABS tablet Take 1 tablet by mouth twice daily 60 tablet 6   dextromethorphan (DELSYM) 30 MG/5ML liquid Take 30 mg by mouth as needed for cough.     feeding supplement (ENSURE ENLIVE / ENSURE PLUS) LIQD Take 237 mLs by mouth 3 (three) times daily between meals. 237 mL 12   levothyroxine (SYNTHROID) 100 MCG tablet TAKE 1 TABLET BY MOUTH BEFORE BREAKFAST 30 tablet 3   lidocaine (XYLOCAINE) 2 % solution TAKE 15 ML BY MOUTH 4 TIMES DAILY WITH MEALS 480 mL 0   megestrol (MEGACE) 400 MG/10ML suspension Take 10 mLs (400 mg total) by mouth 2 (two) times daily. 480 mL 1   Multiple Vitamin (MULTIVITAMIN WITH MINERALS) TABS tablet Take 1  tablet by mouth daily.     metoprolol tartrate (LOPRESSOR) 25 MG tablet Take 1 tablet (25 mg total) by mouth 2 (two) times daily. 180 tablet 3   No current facility-administered medications for this visit.    ALLERGIES:  No Known Allergies  PHYSICAL EXAM:  Performance status (ECOG): 1 - Symptomatic but completely ambulatory  Vitals:   02/21/21 1115  BP: (!) 149/64  Pulse: 71  Resp: 18  Temp: 98.2 F (36.8 C)  SpO2: 100%   Wt Readings from Last 3 Encounters:  02/21/21 156 lb 8 oz (71 kg)  02/13/21 152 lb 9.6 oz (69.2 kg)  02/04/21 153 lb 4.8 oz (69.5 kg)   Physical Exam Vitals reviewed.  Constitutional:      Appearance: Normal appearance.  Cardiovascular:     Rate and Rhythm: Normal rate and regular rhythm.     Pulses: Normal pulses.     Heart sounds: Normal heart sounds.  Pulmonary:     Effort: Pulmonary effort is normal.     Breath sounds: Normal breath sounds.  Neurological:     General: No focal deficit present.     Mental Status: He is alert and oriented to person, place, and time.  Psychiatric:        Mood and Affect: Mood normal.        Behavior: Behavior normal.     LABORATORY DATA:  I have reviewed the labs as listed.  CBC Latest Ref Rng & Units 01/31/2021  01/22/2021 01/16/2021  WBC 4.0 - 10.5 K/uL 5.1 8.3 10.8(H)  Hemoglobin 13.0 - 17.0 g/dL 10.0(L) 9.7(L) 9.7(L)  Hematocrit 39.0 - 52.0 % 31.0(L) 29.9(L) 30.5(L)  Platelets 150 - 400 K/uL 146(L) 136(L) 129(L)   CMP Latest Ref Rng & Units 02/21/2021 01/31/2021 01/22/2021  Glucose 70 - 99 mg/dL 166(H) 88 74  BUN 8 - 23 mg/dL 17 17 15   Creatinine 0.61 - 1.24 mg/dL 0.91 0.87 0.77  Sodium 135 - 145 mmol/L 135 135 139  Potassium 3.5 - 5.1 mmol/L 3.5 4.7 4.3  Chloride 98 - 111 mmol/L 103 100 105  CO2 22 - 32 mmol/L 24 24 27   Calcium 8.9 - 10.3 mg/dL 8.9 9.2 8.6  Total Protein 6.5 - 8.1 g/dL 6.9 6.8 5.6(L)  Total Bilirubin 0.3 - 1.2 mg/dL 0.5 0.6 0.5  Alkaline Phos 38 - 126 U/L 54 60 -  AST 15 - 41 U/L 15 19 16   ALT 0 - 44 U/L 10 16 9     DIAGNOSTIC IMAGING:  I have independently reviewed the scans and discussed with the patient. CT Chest W Contrast  Result Date: 02/14/2021 CLINICAL DATA:  71 year old male with history of non-small cell lung cancer. Follow-up study. EXAM: CT CHEST WITH CONTRAST TECHNIQUE: Multidetector CT imaging of the chest was performed during intravenous contrast administration. CONTRAST:  87mL OMNIPAQUE IOHEXOL 350 MG/ML SOLN COMPARISON:  Chest CT 12/13/2020. FINDINGS: Cardiovascular: Heart size is normal. There is no significant pericardial fluid, thickening or pericardial calcification. There is aortic atherosclerosis, as well as atherosclerosis of the great vessels of the mediastinum and the coronary arteries, including calcified atherosclerotic plaque in the left main, left anterior descending, left circumflex and right coronary arteries. Mediastinum/Nodes: Extensive left paratracheal lymphadenopathy contiguous with the AP window nodal station currently measuring 2.8 x 3.1 cm (axial image 46 of series 3), previously 2.3 x 3.2 cm. Left infrahilar lymphadenopathy (axial image 58 of series 3) measuring 2.1 x 2.4 cm (previously 1.8 x 1.9 cm). No right hilar or right-sided  mediastinal lymphadenopathy. Esophagus is unremarkable in appearance. No axillary lymphadenopathy. Lungs/Pleura: Chronic mass-like architectural distortion and volume loss in the left upper lobe again noted, compatible with chronic postradiation fibrosis. Left lower lobe pulmonary nodule (axial image 64 of series 5) measuring 8 x 8 mm, stable. Patchy areas of ground-glass attenuation and peribronchovascular ground-glass attenuation micro nodularity throughout the left lower lobe, slightly more pronounced than the prior examination. No confluent consolidative airspace disease. Small chronic left pleural effusion partially loculated in the base of the left hemithorax. No right pleural effusion. Upper Abdomen: Aortic atherosclerosis. Incompletely imaged low-attenuation lesion in the upper pole of the right kidney measuring at least 3.3 cm in diameter, not characterized but statistically likely a cyst. 1.8 cm simple cyst also noted in the upper pole of the right kidney. Musculoskeletal: There are no aggressive appearing lytic or blastic lesions noted in the visualized portions of the skeleton. IMPRESSION: 1. Worsening left infrahilar and left paratracheal/AP window lymphadenopathy, concerning for progressive disease. 2. Previously noted left lower lobe pulmonary nodule is stable measuring 8 mm. 3. Increasing peribronchovascular ground-glass attenuation nodularity in the left lower lobe, nonspecific, potentially infectious or inflammatory in etiology, although aerogenous metastases can have a very similar appearance. 4. Aortic atherosclerosis, in addition to left main and three-vessel coronary artery disease. Assessment for potential risk factor modification, dietary therapy or pharmacologic therapy may be warranted, if clinically indicated. 5. Additional incidental findings, as above. Aortic Atherosclerosis (ICD10-I70.0). Electronically Signed   By: Vinnie Langton M.D.   On: 02/14/2021 07:54   MR BRAIN W WO  CONTRAST  Result Date: 01/30/2021 CLINICAL DATA:  Lung cancer, brain metastases, status post craniotomy 10/26/2020 EXAM: MRI HEAD WITHOUT AND WITH CONTRAST TECHNIQUE: Multiplanar, multiecho pulse sequences of the brain and surrounding structures were obtained without and with intravenous contrast. CONTRAST:  34mL MULTIHANCE GADOBENATE DIMEGLUMINE 529 MG/ML IV SOLN COMPARISON:  10/26/2020, 10/12/2020. FINDINGS: Brain: Status post right frontal craniotomy with subjacent resection cavity in the right frontal lobe. Resolution of previously noted immediate postsurgical changes, including pneumocephalus. Slight interval retraction of the resection cavity. Minimal thin peripheral enhancement about the resection cavity, likely postsurgical, there is also mild restricted diffusion about the resection cavity, also favored to be postsurgical. No significant nodular enhancement. Overall decreased surrounding T2 hyperintense signal compared to 10/12/2020 and 10/26/2020. Additional foci of increased signal on diffusion-weighted imaging in the left posterior frontal lobe (series 3, image 67), left parietal lobe (series 3, image 63), and left internal capsule (series 3, image 61), likely acute or subacute infarcts; these do not demonstrate associated enhancement. Blood products are seen in the resection cavity. No acute hemorrhage. No new enhancing lesions. No mass effect or midline shift. No hydrocephalus. Vascular: Normal flow voids. Skull and upper cervical spine: Status post right craniotomy. Normal marrow signal. Degenerative changes in the imaged cervical spine. Sinuses/Orbits: Mild mucosal thickening in left greater than right maxillary sinus and ethmoid air cells. The orbits are unremarkable. Other: Fluid in the left mastoid air cells. IMPRESSION: 1. Status post right frontal craniotomy and resection of a right frontal lobe mass. Slight interval retraction of the resection cavity with minimal thin peripheral enhancement,  favored to be postsurgical, although residual tumor cannot be excluded. Attention on follow-up. Overall decreased surrounding edema. 2. Foci of increased signal on diffusion-weighted imaging in the left posterior frontal lobe, left parietal lobe, and left internal capsule, likely acute or subacute infarcts. 3. No new foci of metastatic disease. Electronically Signed   By: Francetta Found.D.  On: 01/30/2021 21:38     ASSESSMENT:  1.  Left lung cancer: -Presentation to the ER on 10/06/2019 with cough and dizziness. -CT chest with contrast on 10/06/2019 showed large left suprahilar mass completely obstructing left upper lobe bronchus, measuring 7 x 5.3 x 5.8 cm.  Mass extends into AP window and narrows the left mainstem bronchus.  Small subpleural nodule in the left lower lobe measuring 4 mm indeterminate.  AP window nodal mass measuring 3.4 cm.  No supraclavicular adenopathy.  Subcarinal enlarged lymph node measures 1.4 cm. -PET scan on 10/14/2019 shows large hypermetabolic left upper lobe mass surrounding the left hilum, extending into pleural surface not invading the chest wall.  Direct invasion into the AP window and metastatic subcarinal lymph node, T4 N2 M0.  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 -10 pound weight loss in the last 6 months.  Hoarseness for last 1 week. -MRI of the brain on 10/28/2019 with no evidence of metastasis. -Concurrent chemoradiation therapy started on 11/28/2019. Completed 01/02/20 ( chemo) 01/06/2020(radiation) -CT chest on 01/20/2020 showed significant interval decrease in consolidation of the left upper lobe, interval decrease in irregular nodularity and consolidation of the left lower lobe.  Unchanged posttreatment appearance of soft tissue about the left hilum and AP window.  No discretely enlarged mediastinal or hilar lymph nodes.  Trace left pleural effusion, decreased compared to prior exam. -Consolidation  durvalumab started on 01/30/2020. -CT chest with contrast on 03/30/2020 with the development of small left-sided pleural effusion with further volume loss.  Bandlike scarring in the left lower lobe since prior study likely posttreatment changes.  Small bone lesion at T1 stable.  1 small nodule in the right chest which is new about 4 mm.  Will monitor.   2.  Social/family history: -He smoked 1 pack/day for 45 years, quit 5 years ago. -No family history of malignancies.   PLAN:  1.  Advanced squamous cell carcinoma of the left lung: - We have discussed the CT chest with contrast from 02/13/2021 which showed worsening left infrahilar and left paratracheal/AP window lymphadenopathy concerning for progressive disease.  Increasing peribronchovascular groundglass attenuation nodularity in the left lower lobe, nonspecific. - We had a prolonged discussion about best supportive care in the form of hospice due to poor tolerability and increased risk of infections. - He has been active and feeling better in the last few days.  He is also eating better. - After long discussion, he has agreed to enroll in palliative care which could be transition to hospice.   2.  Right frontal lobe mass:  - MRI of the brain on 01/29/2021 showed interval retraction of the resection cavity with minimal thin peripheral enhancement, postsurgical changes.  Foci of increased signal in the left posterior frontal lobe, left parietal lobe and left internal capsule, likely acute or subacute infarcts.  No new focus of metastatic disease.  3.  Atrial fibrillation: - Continue Eliquis 5 mg twice daily.   4.  Weight loss: - Continue Megace 400 mg twice daily.   5.  Hypertension: - Continue metoprolol.   6.  Hypothyroidism: - Continue Synthroid daily.  7.  Cough: - Continue hydrocodone syrup every 8 hours as needed.     Orders placed this encounter:  No orders of the defined types were placed in this encounter.    Derek Jack, MD [place] [phone]   I, Thana Ates, am acting as a Education administrator for  Dr. Derek Jack.  I, Derek Jack MD, have reviewed the above documentation for accuracy and completeness, and I agree with the above.

## 2021-02-21 ENCOUNTER — Inpatient Hospital Stay (HOSPITAL_COMMUNITY): Payer: Medicare Other | Attending: Hematology | Admitting: Hematology

## 2021-02-21 ENCOUNTER — Other Ambulatory Visit: Payer: Self-pay

## 2021-02-21 ENCOUNTER — Inpatient Hospital Stay (HOSPITAL_COMMUNITY): Payer: Medicare Other

## 2021-02-21 VITALS — BP 149/64 | HR 71 | Temp 98.2°F | Resp 18 | Ht 62.0 in | Wt 156.5 lb

## 2021-02-21 DIAGNOSIS — I10 Essential (primary) hypertension: Secondary | ICD-10-CM | POA: Insufficient documentation

## 2021-02-21 DIAGNOSIS — E039 Hypothyroidism, unspecified: Secondary | ICD-10-CM | POA: Diagnosis not present

## 2021-02-21 DIAGNOSIS — R634 Abnormal weight loss: Secondary | ICD-10-CM | POA: Diagnosis not present

## 2021-02-21 DIAGNOSIS — C3492 Malignant neoplasm of unspecified part of left bronchus or lung: Secondary | ICD-10-CM

## 2021-02-21 DIAGNOSIS — Z95828 Presence of other vascular implants and grafts: Secondary | ICD-10-CM

## 2021-02-21 LAB — COMPREHENSIVE METABOLIC PANEL
ALT: 10 U/L (ref 0–44)
AST: 15 U/L (ref 15–41)
Albumin: 3.4 g/dL — ABNORMAL LOW (ref 3.5–5.0)
Alkaline Phosphatase: 54 U/L (ref 38–126)
Anion gap: 8 (ref 5–15)
BUN: 17 mg/dL (ref 8–23)
CO2: 24 mmol/L (ref 22–32)
Calcium: 8.9 mg/dL (ref 8.9–10.3)
Chloride: 103 mmol/L (ref 98–111)
Creatinine, Ser: 0.91 mg/dL (ref 0.61–1.24)
GFR, Estimated: >60 mL/min (ref >60)
Glucose, Bld: 166 mg/dL — ABNORMAL HIGH (ref 70–99)
Potassium: 3.5 mmol/L (ref 3.5–5.1)
Sodium: 135 mmol/L (ref 135–145)
Total Bilirubin: 0.5 mg/dL (ref 0.3–1.2)
Total Protein: 6.9 g/dL (ref 6.5–8.1)

## 2021-02-21 LAB — TSH: TSH: 0.232 u[IU]/mL — ABNORMAL LOW (ref 0.350–4.500)

## 2021-02-21 LAB — MAGNESIUM: Magnesium: 1.6 mg/dL — ABNORMAL LOW (ref 1.7–2.4)

## 2021-02-21 NOTE — Patient Instructions (Signed)
[place] at [facility] Discharge Instructions    You were seen and examined today by Dr. Delton Coombes.  He reviewed the results of your CT scan, which shows your cancer has progressed.  We will not proceed with any further treatment going forward.   We will refer you to palliative care.   Please call the clinic if you should need anything in the future.     Thank you for choosing [place] at [facility] to provide your oncology and hematology care.  To afford each patient quality time with our provider, please arrive at least 15 minutes before your scheduled appointment time.   If you have a lab appointment with the [facility] please come in thru the Main Entrance and check in at the main information desk.  You need to re-schedule your appointment should you arrive 10 or more minutes late.  We strive to give you quality time with our providers, and arriving late affects you and other patients whose appointments are after yours.  Also, if you no show three or more times for appointments you may be dismissed from the clinic at the providers discretion.     Again, thank you for choosing [facility].  Our hope is that these requests will decrease the amount of time that you wait before being seen by our physicians.       _____________________________________________________________  Should you have questions after your visit to [facility], please contact our office at [phone] and follow the prompts.  Our office hours are 8:00 a.m. and 4:30 p.m. Monday - Friday.  Please note that voicemails left after 4:00 p.m. may not be returned until the following business day.  We are closed weekends and major holidays.  You do have access to a nurse 24-7, just call the main number to the clinic [phone] and do not press any options, hold on the line and a nurse will answer the phone.    For prescription refill  requests, have your pharmacy contact our office and allow 72 hours.    Due to Covid, you will need to wear a mask upon entering the hospital. If you do not have a mask, a mask will be given to you at the Main Entrance upon arrival. For doctor visits, patients may have 1 support person age 47 or older with them. For treatment visits, patients can not have anyone with them due to social distancing guidelines and our immunocompromised population.

## 2021-02-22 IMAGING — CT CT CHEST W/ CM
2 of 3 series · 15 of 36 positions shown, 18 images · IV contrast (Omnipaque or Isovue)
Comparison: Outside CT chest, 12/16/2019, PET-CT, 10/14/2019, CT
chest, 10/06/2019

CLINICAL DATA: Metastatic non-small cell lung cancer, assess
treatment response

EXAM:
CT CHEST WITH CONTRAST
TECHNIQUE: Multidetector CT imaging of the chest was performed during
intravenous contrast administration.
CONTRAST:  75mL OMNIPAQUE IOHEXOL 300 MG/ML  SOLN

[Series 2: routine chest with · axial · 0.78mm/px · z∈[+1246,+1512]mm · 12 of 157 slices shown, 15 images]
[im 12/157  mediastinal]
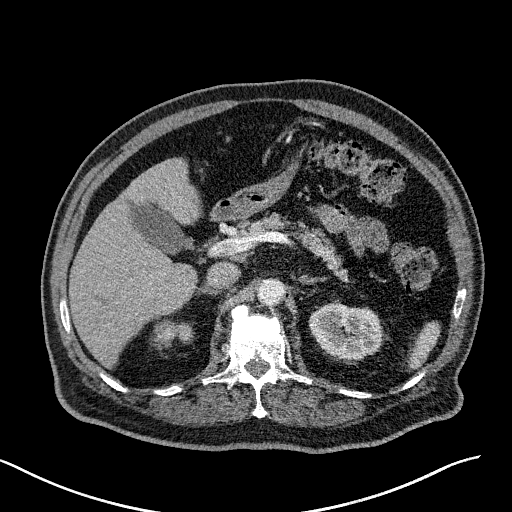
[im 12/157  lung]
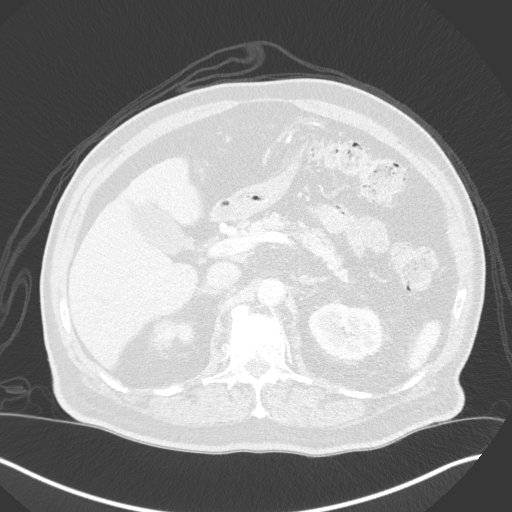
[im 24/157  lung]
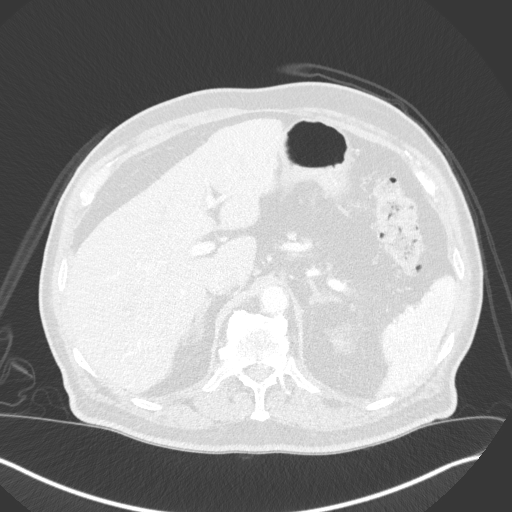
[im 35/157  lung]
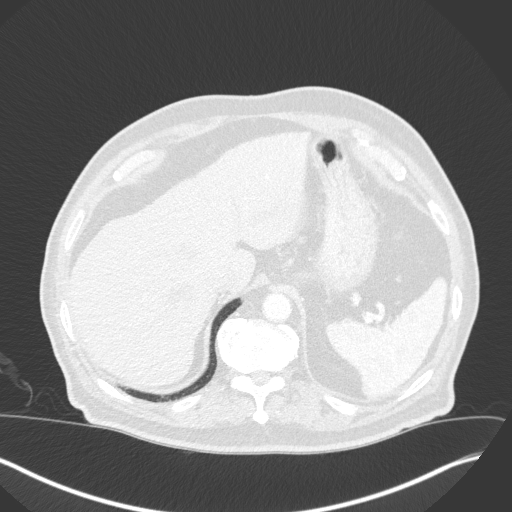
[im 47/157  lung]
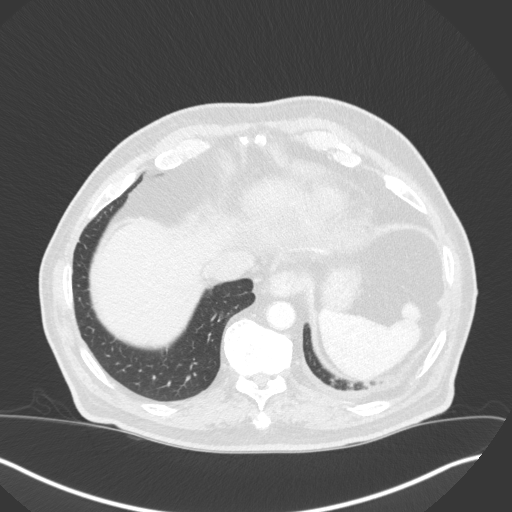
[im 58/157  mediastinal]
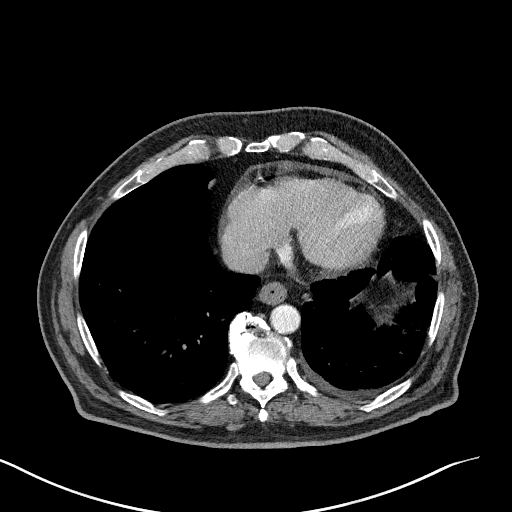
[im 58/157  lung]
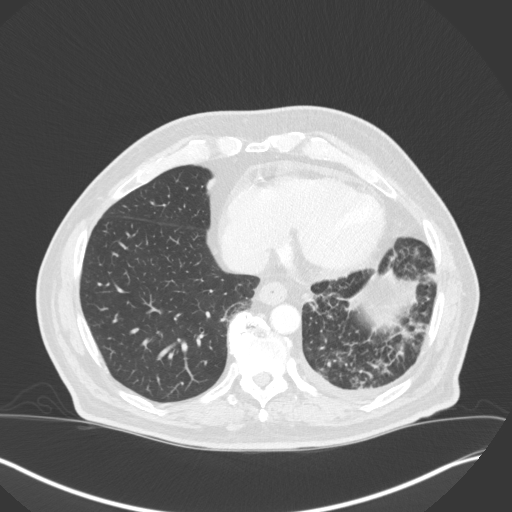
[im 70/157  lung]
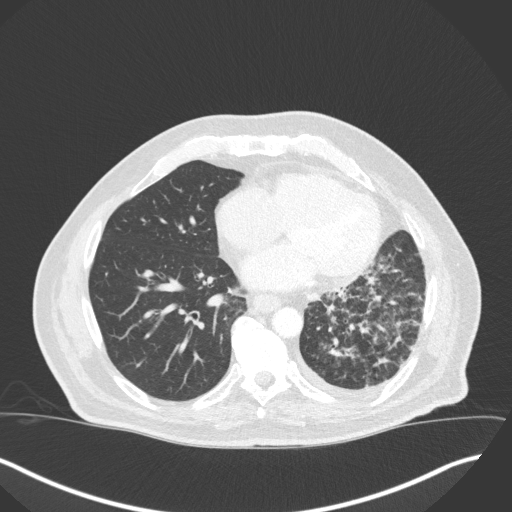
[im 87/157  lung]
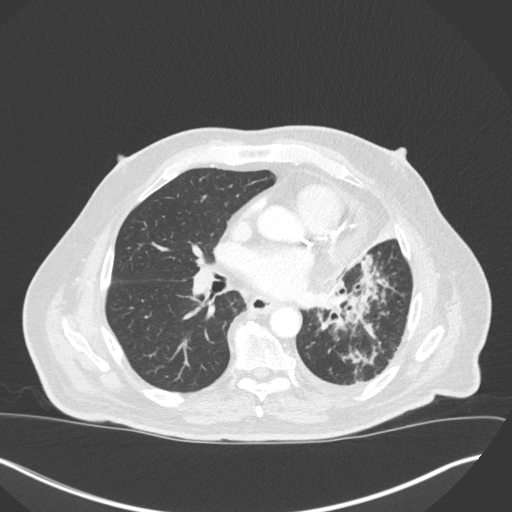
[im 99/157  lung]
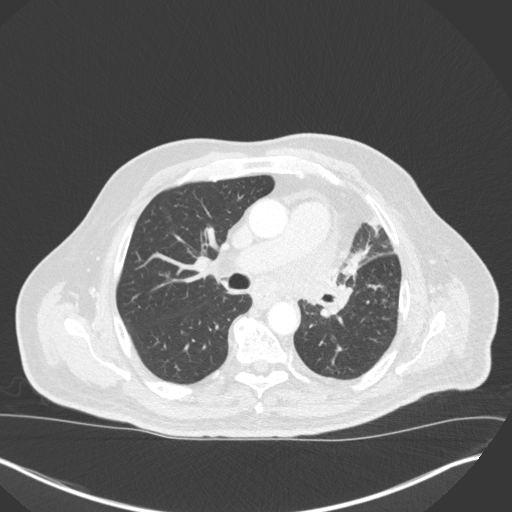
[im 110/157  mediastinal]
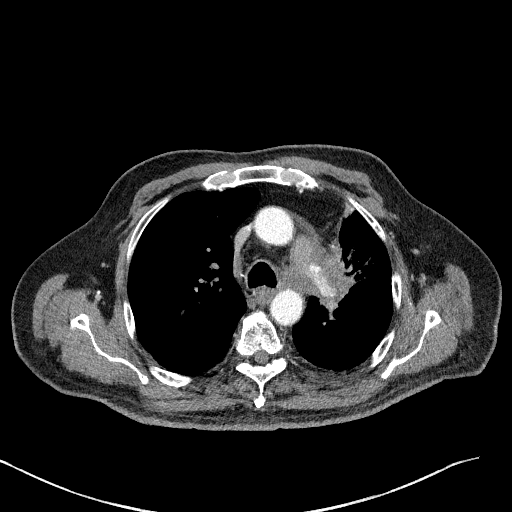
[im 110/157  lung]
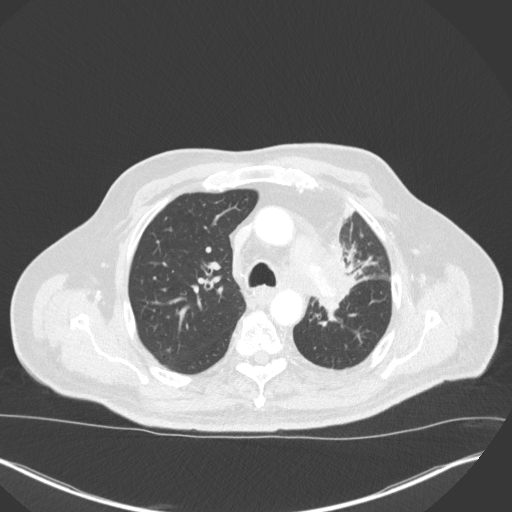
[im 122/157  lung]
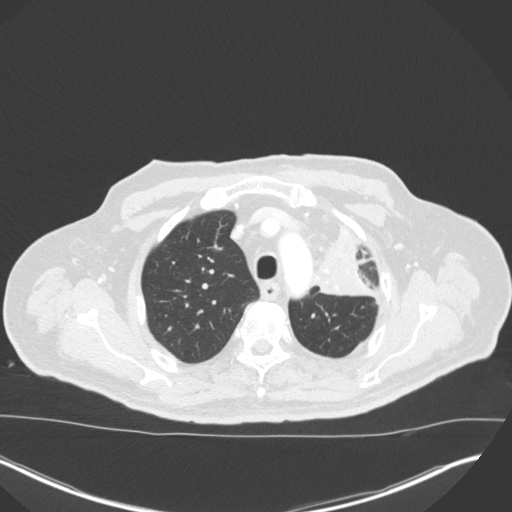
[im 133/157  lung]
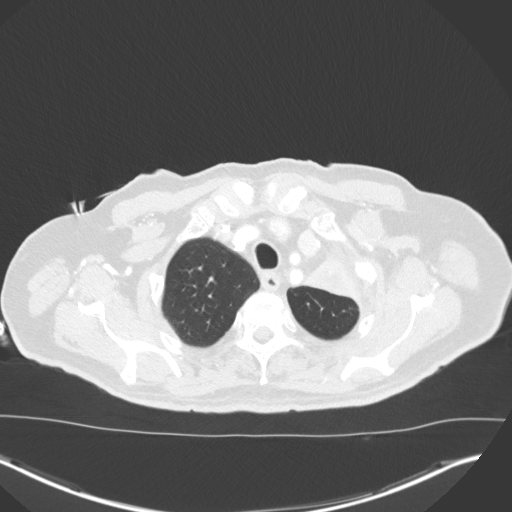
[im 145/157  lung]
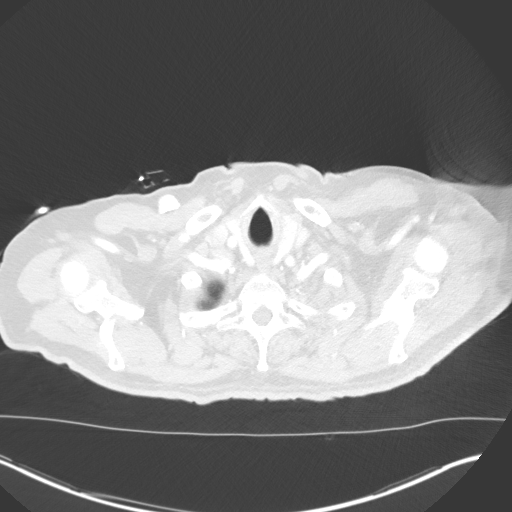

[Series 5: coronal · coronal · 0.66mm/px · 3 of 150 slices shown]
[im 30/150  lung]
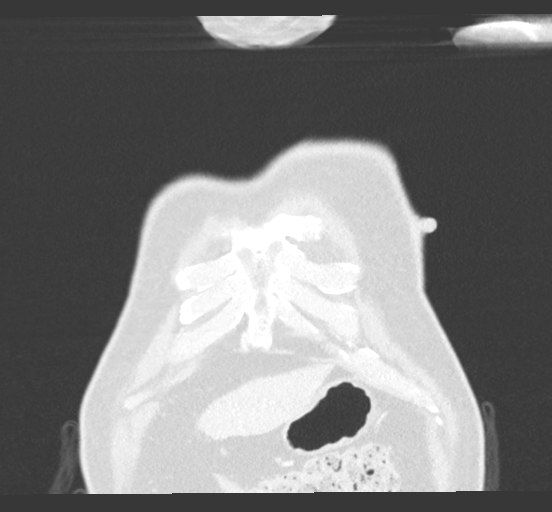
[im 60/150  lung]
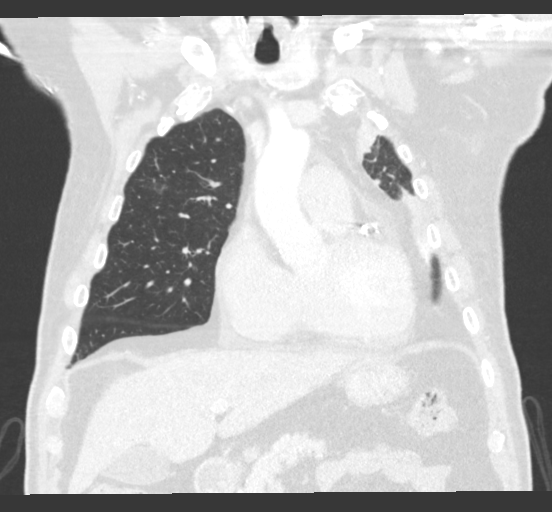
[im 90/150  lung]
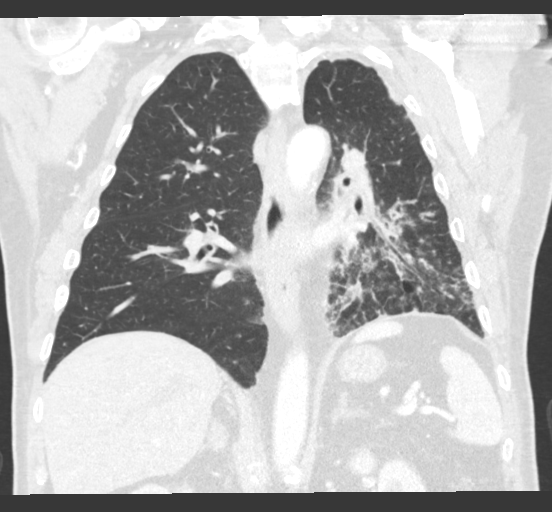

[15 of 36 positions shown; findings below may reference images not displayed]

FINDINGS: Cardiovascular: Right chest port catheter. Aortic atherosclerosis.
Normal heart size. Extensive 3 vessel coronary artery calcifications
and/or stents. Unchanged trace pericardial effusion.

Mediastinum/Nodes: Unchanged post treatment appearance of soft
tissue about the left hilum and AP window (series 2, image 55). No
discretely enlarged mediastinal, hilar, or axillary lymph nodes.
Unchanged heterogeneously hypodense nodule of the inferior left lobe
of the thyroid. In the setting of significant comorbidities or
limited life expectancy, no follow-up recommended (ref: [HOSPITAL]. [DATE]): 143-50). Trachea, and esophagus demonstrate
no significant findings.

Lungs/Pleura: Trace left pleural effusion, decreased compared to
prior examination. Significant interval decrease in bulk of
consolidation of the left upper lobe, with improved aeration. The
most dense region of consolidation of the superior left upper lobe
measures approximately 5.5 x 3.8 cm in axial extent, previously
x 5.1 cm when measured similarly (series 2, image 39). Significant
interval decrease in extensive irregular nodularity and
consolidation of the left lower lobe (series 4, image 95).

Upper Abdomen: No acute abnormality.

Musculoskeletal: No chest wall mass or suspicious bone lesions
identified.
IMPRESSION: 1. Significant interval decrease in bulk of consolidation of the
left upper lobe, with improved aeration.
2. Significant interval decrease in extensive irregular nodularity
and consolidation of the left lower lobe, likely reflecting
lymphangitic disease.
3. Unchanged post treatment appearance of soft tissue about the left
hilum and AP window. No discretely enlarged mediastinal or hilar
lymph nodes.
4. Trace left pleural effusion, decreased compared to prior
examination, consistent with improved malignant effusion.
5. Overall constellation of findings is consistent with treatment
response.
6. Coronary artery disease.

Aortic Atherosclerosis ([id number]).

## 2021-04-02 ENCOUNTER — Other Ambulatory Visit: Payer: Self-pay | Admitting: Radiation Therapy

## 2021-04-22 ENCOUNTER — Telehealth: Payer: Self-pay

## 2021-04-22 NOTE — Telephone Encounter (Signed)
Alan [name] called to share that pt is declining and wanted to know if pt had to keep MRI and MD apt with Dr. Mickeal Skinner. She is waiting on palliative care referral from Dr. Tomie China office.  ? ?Routed to provider. ?

## 2021-05-02 ENCOUNTER — Telehealth: Payer: Self-pay | Admitting: Internal Medicine

## 2021-05-02 NOTE — Telephone Encounter (Signed)
.  Called pt per 3/16 inbasket , Patient was unavailable, a message with appt time and date was left with number on file.   ?

## 2021-05-03 ENCOUNTER — Other Ambulatory Visit: Payer: Medicare Other

## 2021-05-06 ENCOUNTER — Inpatient Hospital Stay: Payer: Medicare Other | Admitting: Internal Medicine

## 2021-05-06 ENCOUNTER — Inpatient Hospital Stay: Payer: Medicare Other | Attending: Internal Medicine

## 2021-05-13 ENCOUNTER — Inpatient Hospital Stay: Payer: Medicare Other | Admitting: Internal Medicine

## 2021-06-17 DEATH — deceased

## 2021-06-18 ENCOUNTER — Ambulatory Visit: Payer: Medicare Other | Admitting: Pulmonary Disease

## 2021-08-16 ENCOUNTER — Ambulatory Visit: Payer: Medicare Other | Admitting: Cardiology
